# Patient Record
Sex: Female | Born: 1940 | Race: White | Hispanic: No | Smoking: Former smoker
Health system: Southern US, Community
[De-identification: ages and names within clinical notes are randomized; demographics above are authoritative.]

## PROBLEM LIST (undated history)

## (undated) DIAGNOSIS — K2210 Ulcer of esophagus without bleeding: Secondary | ICD-10-CM

## (undated) DIAGNOSIS — M8580 Other specified disorders of bone density and structure, unspecified site: Secondary | ICD-10-CM

## (undated) DIAGNOSIS — E039 Hypothyroidism, unspecified: Secondary | ICD-10-CM

## (undated) DIAGNOSIS — K635 Polyp of colon: Secondary | ICD-10-CM

## (undated) DIAGNOSIS — I10 Essential (primary) hypertension: Secondary | ICD-10-CM

## (undated) DIAGNOSIS — D649 Anemia, unspecified: Secondary | ICD-10-CM

## (undated) DIAGNOSIS — K219 Gastro-esophageal reflux disease without esophagitis: Secondary | ICD-10-CM

## (undated) DIAGNOSIS — K859 Acute pancreatitis without necrosis or infection, unspecified: Secondary | ICD-10-CM

## (undated) DIAGNOSIS — I5032 Chronic diastolic (congestive) heart failure: Secondary | ICD-10-CM

## (undated) DIAGNOSIS — R011 Cardiac murmur, unspecified: Secondary | ICD-10-CM

## (undated) DIAGNOSIS — F32A Depression, unspecified: Secondary | ICD-10-CM

## (undated) DIAGNOSIS — N189 Chronic kidney disease, unspecified: Secondary | ICD-10-CM

## (undated) DIAGNOSIS — F329 Major depressive disorder, single episode, unspecified: Secondary | ICD-10-CM

## (undated) DIAGNOSIS — F1010 Alcohol abuse, uncomplicated: Secondary | ICD-10-CM

## (undated) DIAGNOSIS — C50919 Malignant neoplasm of unspecified site of unspecified female breast: Secondary | ICD-10-CM

## (undated) DIAGNOSIS — I779 Disorder of arteries and arterioles, unspecified: Secondary | ICD-10-CM

## (undated) DIAGNOSIS — M1990 Unspecified osteoarthritis, unspecified site: Secondary | ICD-10-CM

## (undated) HISTORY — PX: EYE SURGERY: SHX253

## (undated) HISTORY — DX: Hypothyroidism, unspecified: E03.9

## (undated) HISTORY — PX: OTHER SURGICAL HISTORY: SHX169

## (undated) HISTORY — DX: Major depressive disorder, single episode, unspecified: F32.9

## (undated) HISTORY — DX: Depression, unspecified: F32.A

## (undated) HISTORY — PX: THYROIDECTOMY: SHX17

## (undated) HISTORY — PX: COSMETIC SURGERY: SHX468

## (undated) HISTORY — DX: Other specified disorders of bone density and structure, unspecified site: M85.80

## (undated) HISTORY — PX: JOINT REPLACEMENT: SHX530

## (undated) HISTORY — PX: HIP ARTHROPLASTY: SHX981

## (undated) HISTORY — DX: Ulcer of esophagus without bleeding: K22.10

## (undated) HISTORY — DX: Gastro-esophageal reflux disease without esophagitis: K21.9

## (undated) HISTORY — DX: Chronic diastolic (congestive) heart failure: I50.32

## (undated) HISTORY — PX: ABDOMINOPLASTY: SUR9

## (undated) HISTORY — PX: APPENDECTOMY: SHX54

## (undated) HISTORY — DX: Essential (primary) hypertension: I10

## (undated) HISTORY — PX: ABDOMINAL HYSTERECTOMY: SHX81

## (undated) HISTORY — DX: Alcohol abuse, uncomplicated: F10.10

## (undated) HISTORY — DX: Disorder of arteries and arterioles, unspecified: I77.9

## (undated) HISTORY — DX: Acute pancreatitis without necrosis or infection, unspecified: K85.90

## (undated) HISTORY — DX: Polyp of colon: K63.5

## (undated) HISTORY — PX: CATARACT EXTRACTION W/ INTRAOCULAR LENS  IMPLANT, BILATERAL: SHX1307

---

## 1974-03-27 DIAGNOSIS — Z87442 Personal history of urinary calculi: Secondary | ICD-10-CM

## 1974-03-27 HISTORY — DX: Personal history of urinary calculi: Z87.442

## 1999-08-16 ENCOUNTER — Encounter: Admission: RE | Admit: 1999-08-16 | Discharge: 1999-08-16 | Payer: Self-pay | Admitting: Family Medicine

## 1999-08-16 ENCOUNTER — Encounter: Payer: Self-pay | Admitting: Family Medicine

## 2001-03-27 ENCOUNTER — Emergency Department (HOSPITAL_COMMUNITY): Admission: EM | Admit: 2001-03-27 | Discharge: 2001-03-27 | Payer: Self-pay | Admitting: Emergency Medicine

## 2001-03-27 ENCOUNTER — Encounter: Payer: Self-pay | Admitting: Emergency Medicine

## 2002-03-04 ENCOUNTER — Inpatient Hospital Stay (HOSPITAL_COMMUNITY): Admission: EM | Admit: 2002-03-04 | Discharge: 2002-03-07 | Payer: Self-pay | Admitting: Psychiatry

## 2003-01-29 ENCOUNTER — Inpatient Hospital Stay (HOSPITAL_COMMUNITY): Admission: EM | Admit: 2003-01-29 | Discharge: 2003-02-02 | Payer: Self-pay | Admitting: Emergency Medicine

## 2003-07-03 ENCOUNTER — Encounter: Admission: RE | Admit: 2003-07-03 | Discharge: 2003-07-03 | Payer: Self-pay | Admitting: Orthopedic Surgery

## 2003-11-04 ENCOUNTER — Inpatient Hospital Stay (HOSPITAL_COMMUNITY): Admission: EM | Admit: 2003-11-04 | Discharge: 2003-11-06 | Payer: Self-pay | Admitting: Emergency Medicine

## 2003-11-10 ENCOUNTER — Encounter: Admission: RE | Admit: 2003-11-10 | Discharge: 2003-11-24 | Payer: Self-pay | Admitting: Family Medicine

## 2004-01-05 ENCOUNTER — Encounter: Admission: RE | Admit: 2004-01-05 | Discharge: 2004-02-03 | Payer: Self-pay | Admitting: Family Medicine

## 2004-05-05 ENCOUNTER — Encounter: Admission: RE | Admit: 2004-05-05 | Discharge: 2004-08-03 | Payer: Self-pay | Admitting: Family Medicine

## 2004-10-12 ENCOUNTER — Ambulatory Visit: Payer: Self-pay | Admitting: Cardiology

## 2004-10-26 ENCOUNTER — Ambulatory Visit: Payer: Self-pay

## 2005-02-13 ENCOUNTER — Ambulatory Visit: Payer: Self-pay | Admitting: Cardiology

## 2007-12-09 ENCOUNTER — Encounter: Admission: RE | Admit: 2007-12-09 | Discharge: 2007-12-09 | Payer: Self-pay | Admitting: Family Medicine

## 2007-12-12 ENCOUNTER — Encounter: Admission: RE | Admit: 2007-12-12 | Discharge: 2007-12-12 | Payer: Self-pay | Admitting: Family Medicine

## 2008-01-08 ENCOUNTER — Encounter: Admission: RE | Admit: 2008-01-08 | Discharge: 2008-01-08 | Payer: Self-pay | Admitting: Internal Medicine

## 2008-01-08 ENCOUNTER — Other Ambulatory Visit: Admission: RE | Admit: 2008-01-08 | Discharge: 2008-01-08 | Payer: Self-pay | Admitting: Interventional Radiology

## 2008-01-08 ENCOUNTER — Encounter (INDEPENDENT_AMBULATORY_CARE_PROVIDER_SITE_OTHER): Payer: Self-pay | Admitting: Interventional Radiology

## 2008-01-28 ENCOUNTER — Ambulatory Visit: Payer: Self-pay | Admitting: Cardiology

## 2008-03-27 DIAGNOSIS — C50919 Malignant neoplasm of unspecified site of unspecified female breast: Secondary | ICD-10-CM

## 2008-03-27 HISTORY — DX: Malignant neoplasm of unspecified site of unspecified female breast: C50.919

## 2008-04-24 ENCOUNTER — Ambulatory Visit (HOSPITAL_COMMUNITY): Admission: RE | Admit: 2008-04-24 | Discharge: 2008-04-25 | Payer: Self-pay | Admitting: Surgery

## 2008-04-24 ENCOUNTER — Encounter (INDEPENDENT_AMBULATORY_CARE_PROVIDER_SITE_OTHER): Payer: Self-pay | Admitting: Surgery

## 2008-07-28 ENCOUNTER — Encounter: Admission: RE | Admit: 2008-07-28 | Discharge: 2008-07-28 | Payer: Self-pay | Admitting: Obstetrics and Gynecology

## 2008-08-03 ENCOUNTER — Encounter: Admission: RE | Admit: 2008-08-03 | Discharge: 2008-08-03 | Payer: Self-pay | Admitting: Obstetrics and Gynecology

## 2008-08-03 ENCOUNTER — Encounter (INDEPENDENT_AMBULATORY_CARE_PROVIDER_SITE_OTHER): Payer: Self-pay | Admitting: Diagnostic Radiology

## 2008-08-03 HISTORY — PX: BREAST BIOPSY: SHX20

## 2008-08-04 ENCOUNTER — Encounter: Admission: RE | Admit: 2008-08-04 | Discharge: 2008-08-04 | Payer: Self-pay | Admitting: Obstetrics and Gynecology

## 2008-08-10 ENCOUNTER — Encounter: Admission: RE | Admit: 2008-08-10 | Discharge: 2008-08-10 | Payer: Self-pay | Admitting: Obstetrics and Gynecology

## 2008-08-26 ENCOUNTER — Ambulatory Visit (HOSPITAL_BASED_OUTPATIENT_CLINIC_OR_DEPARTMENT_OTHER): Admission: RE | Admit: 2008-08-26 | Discharge: 2008-08-26 | Payer: Self-pay | Admitting: Surgery

## 2008-08-26 ENCOUNTER — Encounter (INDEPENDENT_AMBULATORY_CARE_PROVIDER_SITE_OTHER): Payer: Self-pay | Admitting: Surgery

## 2008-08-26 ENCOUNTER — Encounter: Admission: RE | Admit: 2008-08-26 | Discharge: 2008-08-26 | Payer: Self-pay | Admitting: Surgery

## 2008-08-26 HISTORY — PX: BREAST LUMPECTOMY: SHX2

## 2008-08-31 ENCOUNTER — Ambulatory Visit: Payer: Self-pay | Admitting: Oncology

## 2008-09-08 ENCOUNTER — Ambulatory Visit: Admission: RE | Admit: 2008-09-08 | Discharge: 2008-10-06 | Payer: Self-pay | Admitting: Radiation Oncology

## 2008-10-05 ENCOUNTER — Ambulatory Visit: Payer: Self-pay | Admitting: Oncology

## 2008-10-05 LAB — COMPREHENSIVE METABOLIC PANEL
AST: 19 U/L (ref 0–37)
Albumin: 4 g/dL (ref 3.5–5.2)
BUN: 15 mg/dL (ref 6–23)
Calcium: 9 mg/dL (ref 8.4–10.5)
Chloride: 100 mEq/L (ref 96–112)
Potassium: 4.1 mEq/L (ref 3.5–5.3)

## 2008-10-05 LAB — CBC WITH DIFFERENTIAL/PLATELET
Basophils Absolute: 0 10*3/uL (ref 0.0–0.1)
EOS%: 3.1 % (ref 0.0–7.0)
Eosinophils Absolute: 0.2 10*3/uL (ref 0.0–0.5)
HGB: 12.1 g/dL (ref 11.6–15.9)
NEUT#: 3.1 10*3/uL (ref 1.5–6.5)
RDW: 12.7 % (ref 11.2–14.5)
WBC: 5.5 10*3/uL (ref 3.9–10.3)
lymph#: 1.6 10*3/uL (ref 0.9–3.3)

## 2008-12-09 ENCOUNTER — Ambulatory Visit: Admission: RE | Admit: 2008-12-09 | Discharge: 2008-12-09 | Payer: Self-pay | Admitting: Gynecologic Oncology

## 2009-02-04 ENCOUNTER — Ambulatory Visit: Payer: Self-pay | Admitting: Oncology

## 2009-02-08 ENCOUNTER — Encounter: Admission: RE | Admit: 2009-02-08 | Discharge: 2009-02-08 | Payer: Self-pay | Admitting: Oncology

## 2009-02-08 LAB — COMPREHENSIVE METABOLIC PANEL
ALT: 18 U/L (ref 0–35)
AST: 20 U/L (ref 0–37)
Albumin: 4.2 g/dL (ref 3.5–5.2)
CO2: 26 mEq/L (ref 19–32)
Calcium: 9.1 mg/dL (ref 8.4–10.5)
Chloride: 104 mEq/L (ref 96–112)
Creatinine, Ser: 0.83 mg/dL (ref 0.40–1.20)
Potassium: 3.7 mEq/L (ref 3.5–5.3)
Total Protein: 7.3 g/dL (ref 6.0–8.3)

## 2009-02-08 LAB — CBC WITH DIFFERENTIAL/PLATELET
BASO%: 0.8 % (ref 0.0–2.0)
Basophils Absolute: 0 10*3/uL (ref 0.0–0.1)
EOS%: 2.7 % (ref 0.0–7.0)
HCT: 38.7 % (ref 34.8–46.6)
HGB: 13.3 g/dL (ref 11.6–15.9)
MCHC: 34.3 g/dL (ref 31.5–36.0)
MONO#: 0.5 10*3/uL (ref 0.1–0.9)
NEUT%: 58.3 % (ref 38.4–76.8)
RDW: 13.4 % (ref 11.2–14.5)
WBC: 6.1 10*3/uL (ref 3.9–10.3)
lymph#: 1.8 10*3/uL (ref 0.9–3.3)

## 2009-07-12 ENCOUNTER — Encounter: Admission: RE | Admit: 2009-07-12 | Discharge: 2009-07-12 | Payer: Self-pay | Admitting: Family Medicine

## 2009-08-18 ENCOUNTER — Encounter: Admission: RE | Admit: 2009-08-18 | Discharge: 2009-08-18 | Payer: Self-pay | Admitting: Oncology

## 2009-08-18 ENCOUNTER — Ambulatory Visit: Payer: Self-pay | Admitting: Oncology

## 2009-08-19 ENCOUNTER — Encounter: Admission: RE | Admit: 2009-08-19 | Discharge: 2009-08-19 | Payer: Self-pay | Admitting: Oncology

## 2009-08-19 LAB — COMPREHENSIVE METABOLIC PANEL
ALT: 21 U/L (ref 0–35)
AST: 25 U/L (ref 0–37)
Albumin: 4.2 g/dL (ref 3.5–5.2)
BUN: 18 mg/dL (ref 6–23)
Calcium: 9.2 mg/dL (ref 8.4–10.5)
Chloride: 103 mEq/L (ref 96–112)
Potassium: 3.7 mEq/L (ref 3.5–5.3)

## 2009-08-19 LAB — CBC WITH DIFFERENTIAL/PLATELET
Basophils Absolute: 0 10*3/uL (ref 0.0–0.1)
HCT: 37.3 % (ref 34.8–46.6)
HGB: 13.2 g/dL (ref 11.6–15.9)
MCV: 94.9 fL (ref 79.5–101.0)
NEUT%: 56 % (ref 38.4–76.8)
RBC: 3.93 10*6/uL (ref 3.70–5.45)
RDW: 12.1 % (ref 11.2–14.5)
WBC: 5.3 10*3/uL (ref 3.9–10.3)
lymph#: 1.5 10*3/uL (ref 0.9–3.3)

## 2010-01-26 ENCOUNTER — Ambulatory Visit: Payer: Self-pay

## 2010-01-26 ENCOUNTER — Encounter: Payer: Self-pay | Admitting: Cardiology

## 2010-01-26 DIAGNOSIS — I739 Peripheral vascular disease, unspecified: Secondary | ICD-10-CM

## 2010-01-26 DIAGNOSIS — I779 Disorder of arteries and arterioles, unspecified: Secondary | ICD-10-CM | POA: Insufficient documentation

## 2010-02-28 ENCOUNTER — Encounter: Payer: Self-pay | Admitting: Cardiology

## 2010-02-28 ENCOUNTER — Ambulatory Visit: Payer: Self-pay | Admitting: Cardiology

## 2010-02-28 DIAGNOSIS — I10 Essential (primary) hypertension: Secondary | ICD-10-CM | POA: Insufficient documentation

## 2010-04-16 ENCOUNTER — Encounter: Payer: Self-pay | Admitting: Oncology

## 2010-04-17 ENCOUNTER — Encounter: Payer: Self-pay | Admitting: Oncology

## 2010-04-28 NOTE — Miscellaneous (Signed)
Summary: Orders Update  Clinical Lists Changes  Problems: Added new problem of CAROTID ARTERY DISEASE (ICD-433.10) Orders: Added new Test order of Carotid Duplex (Carotid Duplex) - Signed 

## 2010-04-28 NOTE — Assessment & Plan Note (Signed)
Summary: f2y/pt needs carotid/lwb   Visit Type:  2 years follow up Primary Provider:  ross  CC:  No cardiac complaints.  History of Present Illness: Doing well overall.  No specific complaints per se.  Denies any specific chest pain.  No stroke symptoms.  Ocassional palpitiations.  Feels good overall.  Has one glass of wine per night.   Problems Prior to Update: 1)  Hypertension, Benign  (ICD-401.1) 2)  Carotid Artery Disease  (ICD-433.10)  Current Medications (verified): 1)  Synthroid 150 Mcg Tabs (Levothyroxine Sodium) .... Take 1 Tablet By Mouth Once A Day 2)  Omeprazole 20 Mg Tbec (Omeprazole) .... Take 1 Tablet By Mouth Once A Day 3)  Amlodipine Besylate 10 Mg Tabs (Amlodipine Besylate) .... Take One Tablet By Mouth Daily 4)  Vitamin D2 50,000 Iu .... Once A Week 5)  Vitamin D3 5000 Unit Caps (Cholecalciferol) .... Take 1 Capsule By Mouth Once A Day 6)  Multivitamins  Tabs (Multiple Vitamin) .... Take 1 Tablet By Mouth Once A Day 7)  Aspirin 81 Mg Tbec (Aspirin) .... Take One Tablet By Mouth Daily 8)  Magnesium Oxide 400 Mg Tabs (Magnesium Oxide) .... Take 1 Tablet By Mouth Once A Day 9)  Calcium Carbonate 600 Mg Tabs (Calcium Carbonate) .... Take 1 Tablet By Mouth Two Times A Day 10)  Omega 3,6,9 W/fish 800mg  .... 2 A Day 11)  Super B Complex/vitamin C  Tabs (B Complex-C) .Marland Kitchen.. 1 A Day 12)  Glucosamine 1500 Complex  Caps (Glucosamine-Chondroit-Vit C-Mn) .... 3 Cap Three Times A Day 13)  Stool Softener 100 Mg Caps (Docusate Sodium) .... Take 1 Capsule By Mouth At Bedtime 14)  Benadryl 25 Mg Tabs (Diphenhydramine Hcl) .... Take 1 Tab By Mouth At Bedtime '  Allergies: 1)  ! Penicillin  Vital Signs:  Patient profile:   70 year old female Height:      61 inches Weight:      142.50 pounds BMI:     27.02 Pulse rate:   77 / minute Pulse rhythm:   regular Resp:     18 per minute BP sitting:   140 / 78  (left arm) Cuff size:   large  Vitals Entered By: Sidney Ace (February 28, 2010 1:59 PM)  Physical Exam  General:  Well developed, well nourished, in no acute distress. Head:  normocephalic and atraumatic Eyes:  PERRLA/EOM intact; conjunctiva and lids normal. Neck:  Bilateral carotid bruits.   Lungs:  Clear bilaterally to auscultation and percussion. Heart:  PMI non displaced. Normal S1 and S2.  No murmur, rub or gallop.   Extremities:  No clubbing or cyanosis.  trace edema Neurologic:  Alert and oriented x 3.   EKG  Procedure date:  02/28/2010  Findings:      NSR.  PACs.  No acute changes.    Carotid Doppler  Procedure date:  01/26/2010  Findings:      see report.   Unable to transport document.   Impression & Recommendations:  Problem # 1:  CAROTID ARTERY DISEASE (ICD-433.10) stable.  Follow up at one year.  statins discussed.  She does not want to take.  Her updated medication list for this problem includes:    Aspirin 81 Mg Tbec (Aspirin) .Marland Kitchen... Take one tablet by mouth daily  Problem # 2:  HYPERTENSION, BENIGN (ICD-401.1) controlled.  Life style issues discussed.  Her updated medication list for this problem includes:    Amlodipine Besylate 10 Mg Tabs (Amlodipine besylate) .Marland KitchenMarland KitchenMarland KitchenMarland Kitchen  Take one tablet by mouth daily    Aspirin 81 Mg Tbec (Aspirin) .Marland Kitchen... Take one tablet by mouth daily  Patient Instructions: 1)  Your physician recommends that you schedule a follow-up appointment in: 1 year 2)  Your physician recommends that you continue on your current medications as directed. Please refer to the Current Medication list given to you today.

## 2010-06-07 ENCOUNTER — Other Ambulatory Visit: Payer: Self-pay | Admitting: Obstetrics and Gynecology

## 2010-06-07 DIAGNOSIS — Z780 Asymptomatic menopausal state: Secondary | ICD-10-CM

## 2010-06-07 DIAGNOSIS — Z9889 Other specified postprocedural states: Secondary | ICD-10-CM

## 2010-07-04 LAB — COMPREHENSIVE METABOLIC PANEL
AST: 20 U/L (ref 0–37)
Albumin: 3.8 g/dL (ref 3.5–5.2)
Alkaline Phosphatase: 62 U/L (ref 39–117)
BUN: 17 mg/dL (ref 6–23)
Chloride: 108 mEq/L (ref 96–112)
GFR calc Af Amer: >60 mL/min (ref >60)
Potassium: 4.3 mEq/L (ref 3.5–5.1)
Total Bilirubin: 0.5 mg/dL (ref 0.3–1.2)

## 2010-07-04 LAB — CBC
HCT: 35.1 % — ABNORMAL LOW (ref 36.0–46.0)
Platelets: 138 10*3/uL — ABNORMAL LOW (ref 150–400)
RBC: 3.68 MIL/uL — ABNORMAL LOW (ref 3.87–5.11)
WBC: 5.5 10*3/uL (ref 4.0–10.5)

## 2010-07-04 LAB — DIFFERENTIAL
Basophils Absolute: 0 10*3/uL (ref 0.0–0.1)
Basophils Relative: 0 % (ref 0–1)
Eosinophils Relative: 4 % (ref 0–5)
Monocytes Absolute: 0.5 10*3/uL (ref 0.1–1.0)

## 2010-07-04 LAB — CANCER ANTIGEN 27.29: CA 27.29: 15 U/mL (ref 0–39)

## 2010-07-11 LAB — COMPREHENSIVE METABOLIC PANEL
AST: 14 U/L (ref 0–37)
CO2: 27 mEq/L (ref 19–32)
Chloride: 103 mEq/L (ref 96–112)
Creatinine, Ser: 0.91 mg/dL (ref 0.4–1.2)
GFR calc Af Amer: >60 mL/min (ref >60)
GFR calc non Af Amer: >60 mL/min (ref >60)
Total Bilirubin: 0.7 mg/dL (ref 0.3–1.2)

## 2010-07-11 LAB — DIFFERENTIAL
Basophils Absolute: 0 10*3/uL (ref 0.0–0.1)
Basophils Relative: 0 % (ref 0–1)
Eosinophils Absolute: 0.2 10*3/uL (ref 0.0–0.7)
Eosinophils Relative: 4 % (ref 0–5)
Lymphocytes Relative: 33 % (ref 12–46)

## 2010-07-11 LAB — PROTIME-INR: Prothrombin Time: 13 seconds (ref 11.6–15.2)

## 2010-07-11 LAB — CBC
HCT: 36.8 % (ref 36.0–46.0)
MCV: 94.9 fL (ref 78.0–100.0)
RBC: 3.88 MIL/uL (ref 3.87–5.11)
WBC: 4.3 10*3/uL (ref 4.0–10.5)

## 2010-07-11 LAB — URINALYSIS, ROUTINE W REFLEX MICROSCOPIC
Bilirubin Urine: NEGATIVE
Ketones, ur: NEGATIVE mg/dL
Nitrite: NEGATIVE
Urobilinogen, UA: 0.2 mg/dL (ref 0.0–1.0)

## 2010-07-11 LAB — CALCIUM: Calcium: 8.5 mg/dL (ref 8.4–10.5)

## 2010-07-11 LAB — GLUCOSE, CAPILLARY

## 2010-08-09 NOTE — Op Note (Signed)
Anita Pollard, Anita Pollard NO.:  [id number]   MEDICAL RECORD NO.:  [id number]          PATIENT TYPE:  AMB   LOCATION:  DAY                          FACILITY:  [facility]   PHYSICIAN:  Earnstine Regal, MD      DATE OF BIRTH:  1940/12/26   DATE OF PROCEDURE:  04/24/2008  DATE OF DISCHARGE:                               OPERATIVE REPORT   PREOPERATIVE DIAGNOSIS:  Bilateral thyroid nodules.   POSTOPERATIVE DIAGNOSIS:  Bilateral thyroid nodules.   PROCEDURE:  Total thyroidectomy   SURGEON  Earnstine Regal, MD, FACS   ASSISTANT:  Sammuel Hines. Daiva Nakayama, M.D.   ANESTHESIA:  General.   ESTIMATED BLOOD LOSS:  Minimal.   PREPARATION:  Betadine.   COMPLICATIONS:  None.   INDICATIONS:  The patient is a 70 year old white female from [place],  [place].  During a routine carotid duplex she was noted to have a  thyroid nodule.  Thyroid ultrasound showed an 8-mm nodule in the right  thyroid lobe containing calcifications and a complex 3.8-cm nodule with  calcification in the left thyroid lobe.  Fine-needle aspiration biopsy  showed a limited sample but no overt sign of malignancy.  The patient is  referred by Dr. Delrae Rend from endocrinology for thyroidectomy for  definitive diagnosis.   BODY OF REPORT:  Procedure was done in OR #12 at the [facility].  The patient was brought to the operating room and  placed in a supine position on the operating room table.  Following  administration of general anesthesia, the patient is positioned and then  prepped and draped in the usual strict aseptic fashion.  After  ascertaining that an adequate level of anesthesia had been achieved, a  Kocher incision is made with a #15 blade.  Dissection was carried  through subcutaneous tissues and platysma.  Hemostasis was obtained with  the electrocautery.  Skin flaps were elevated cephalad and caudad from  the thyroid notch to the sternal notch.  A Mahorner  self-retaining  retractor was placed for exposure.  Strap muscles are incised in the  midline and dissection is begun on the left side of the neck.  Left  thyroid lobe was exposed.  It contains a dominant nodule which occupies  a majority of the lobe.  Strap muscles were reflected laterally.  Venous  tributaries were divided between small and medium Ligaclips with the  Harmonic scalpel.  Superior pole vessels are dissected out and ligated  in continuity with 2-0 silk ties and divided with the Harmonic scalpel.  The gland is rolled anteriorly.  Parathyroid tissue is identified and  preserved.  Branches of the inferior thyroid artery are divided between  small Ligaclips with the Harmonic scalpel.  Recurrent nerve was  identified and preserved.  Ligament of Gwenlyn Found was transected with the  electrocautery and the gland is mobilized up and onto the anterior  trachea.  Isthmus is mobilized across the midline.  No significant  pyramidal lobe exists.   Right thyroid lobe is then exposed again, reflecting the strap muscles  laterally.  Right lobe  was much smaller in size, although it contained  some small nodules.  It is gently dissected out.  Superior pole vessels  are again dissected out, ligated in continuity with 2-0 silk ties and  divided with the Harmonic scalpel.  Inferior venous tributaries were  divided between medium Ligaclips with the Harmonic scalpel.  Branches of  the inferior thyroid artery are divided between small Ligaclips with the  Harmonic scalpel.  Recurrent nerve was again identified.  Inferior  parathyroid gland is identified.  Ligament of Gwenlyn Found is transected and  the gland is mobilized onto the anterior trachea.  Remaining inferior  venous tributaries were divided between medium Ligaclips with the  Harmonic scalpel.  The entire thyroid is then submitted to pathology  with a suture marking the left superior pole.   Neck is inspected for hemostasis.  Surgicel was placed in the  operative  field bilaterally.  Strap muscles were reapproximated in the midline  with interrupted 3-0 Vicryl sutures.  Platysma was closed with  interrupted 3-0 Vicryl sutures.  Skin was closed with a running 4-0  Monocryl subcuticular suture.  Wound is washed and dried and benzoin and  Steri-Strips are applied.  Sterile dressings are applied.  The patient  is awakened from anesthesia and brought to the recovery room in stable  condition.  The patient tolerated the procedure well.      Earnstine Regal, MD  Electronically Signed     TMG/MEDQ  D:  04/24/2008  T:  04/24/2008  Job:  [id number]   cc:   Delrae Rend, M.D.   Myriam Jacobson, M.D.  Fax: [phone]

## 2010-08-09 NOTE — Op Note (Signed)
NAMEROTUNDA, GENSER               ACCOUNT NO.:  [id number]   MEDICAL RECORD NO.:  [id number]          PATIENT TYPE:  AMB   LOCATION:  [place]                          FACILITY:  [facility]   PHYSICIAN:  Thomas A. 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   PROCEDURES:  1. Right breast needle-localized lumpectomy.  2. Right axillary sentinel lymph node mapping with injection of      methylene blue dye.   SURGEON:  Marcello Moores A. Cornett, MD   ANESTHESIA:  LMA with 0.25% Sensorcaine with epinephrine.   ESTIMATED BLOOD LOSS:  20 mL.   SPECIMENS:  1. Right breast mass with localizing clip and wire verified by      radiograph to be adequate.  2. Four right axillary sentinel lymph nodes negative by touch prep for      metastatic disease.   INDICATIONS FOR PROCEDURE:  The patient is a 70 year old postmenopausal  female, found to have a cluster of abnormal microcalcifications in a  right breast mass.  Core biopsy showed DCIS, but there is some question  if there is microinvasion on this.  She wished to preserve her breast.  We talked about breast conserving means to include lumpectomy with the  potential for postoperative radiation therapy.  I also felt that  evaluation of her lymph nodes was in order even though she had DCIS,  there were some question of microinvasion.  She wished to proceed with  breast conserving means after lengthy discussion.   DESCRIPTION OF PROCEDURE:  After undergoing wire localization to the  right breast and injection of technetium sulfur colloid to the right  breast, she was brought back to the operating room.  She was placed  supine and LMA anesthesia was initiated.  The right breast was prepped  with alcohol and 4 mL of a combination of methylene blue with normal  saline were injected in a subareolar position and massaged.  The right  breast and right axilla were then prepped and draped in a sterile  fashion.  The sentinel node was done first.  NeoProbe was used to  identify an area of increased activity in the right axilla.  Incision  was made of over area and dissection was carried down into her axilla.  There were 4 sentinel nodes, 2 were blue and hot, the other 2 were just  hot with counts well below the 10% threshold of the highest lymph node.  All 4 were sent for evaluation, which included touch prep, and these  were negative for metastatic disease.  The lumpectomy was done next.  An  oblique incision was made in the upper inner quadrant of her right  breast.  The wire was brought out through the incision and all tissue  around the wire was excised.  The mass was palpable.  Radiograph was  taken and I could see the mass and clip were in the specimen.  We  irrigated out the wound.  I then mobilized some subcutaneous breast  tissue to close the defect using 3-0 Vicryl.  The skin was approximated  with a deep 3-0 Vicryl stitch and 4-0 Monocryl was used to close the  skin in subcuticular fashion.  We examined the axilla and it was found  to be hemostatic.  Small piece of Surgicel was placed in the deep  axilla.  We closed the subcu fat with 3-0 Vicryl and used 4-0 Monocryl  to close the skin in a subcuticular fashion.  Dermabond was applied to  both incisions.  All final counts of sponge, needle, and instruments  were found to be correct at this portion of the case.  The patient was  then awoken and taken to the recovery room in satisfactory condition.       Thomas A. Cornett, M.D.  Electronically Signed     TAC/MEDQ  D:  08/26/2008  T:  08/27/2008  Job:  [id number]   cc:   C. Melinda Crutch, M.D.  Katina Degree, M.D.

## 2010-08-12 NOTE — H&P (Signed)
Anita Pollard, Anita NO.:  [id number]   MEDICAL RECORD NO.:  [id number]                   PATIENT TYPE:  EMS   LOCATION:  ED                                   FACILITY:  [facility]   PHYSICIAN:  Corinna L. Conley Canal, MD             DATE OF BIRTH:  04-Dec-1940   DATE OF ADMISSION:  11/04/2003  DATE OF DISCHARGE:                                HISTORY & PHYSICAL   CHIEF COMPLAINT:  Vomiting.   HISTORY OF PRESENT ILLNESS:  [name] is a 70 year old alcoholic white  female who presents to the emergency room with intractable vomiting.  She  reports that initially the emesis was  nonbloody but then became dark.  She  has a history of upper GI bleed secondary to ulcerative esophagitis.  She  had endoscopy in November of 2004 by Dr. Oletta Lamas which showed this.  She had  no varices at that time.  She also tripped and fell on the stairs this  morning.  She reports that her last drink was on Sunday but apparently  according to EMS there were reports of binging on alcohol.  The patient was  found to have high sugar here in the emergency room and denies history of  diabetes but was told by Dr. Harrington Challenger to watch the sugars and carbohydrates.  She has had no blurry vision, no polyuria, polydipsia, no melena.   PAST MEDICAL HISTORY:  1. Chronic calcific pancreatitis.  2. Alcohol dependence.  3. Ulcerative esophagitis.  4. Anxiety disorder.  5. Hypothyroidism.  6. Osteoarthritis.  7. 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  8. History of admission to [facility] for anxiety, depression, and     alcohol abuse.  9. History of hysterectomy.  10.      Appendectomy.   MEDICATIONS:  1. Wellbutrin XL 300 mg p.o. daily.  2. Levoxyl 150 mcg p.o. daily.  3. Mobic.  4. Premarin 0.625 mg p.o. daily.   SOCIAL HISTORY:  The patient will not quantify the amount of alcohol she  drinks, but per records, it is quite a lot.  She reports her last drink  Sunday.  She  does not smoke.  She is not working.  She is widowed.   FAMILY HISTORY:  Her father had diabetes and her brother.   REVIEW OF SYSTEMS:  As above, otherwise negative.   PHYSICAL EXAMINATION:  VITAL SIGNS:  Her temperature is 98.6, heart rate  111, respiratory rate 20,  blood pressure 140/68.  GENERAL:  In general the patient is an unkempt white female in no acute  distress.  HEENT:  Normocephalic.  She has laceration to the back of her head.  Pupils  equal, round, and reactive to light.  Sclerae nonicteric.  She has slightly  dry mucous membranes.  NECK:  Supple.  No carotid bruits.  No thyromegaly.  LUNGS:  Clear to auscultation bilaterally without wheezes, rhonchi, or  rales.  CARDIOVASCULAR:  Regular rate and rhythm without murmurs, gallops, or rubs.  ABDOMEN:  Normal bowel sounds.  Soft.  She has some mild epigastric  tenderness.  GENITOURINARY/RECTAL:  Deferred.  EXTREMITIES:  No clubbing, cyanosis, or edema.  No deformities.  SKIN:  She has multiple ecchymoses and heeling lacerations.  NEUROLOGIC:  The patient is alert and oriented x3.  Cranial nerves and  sensory motor exam are intact.  PSYCHIATRIC:  The patient is calm and cooperative.   LABORATORY DATA:  CBC is normal.  INR is 0.9.  Sodium is 134, potassium 5.2,  chloride 87, bicarbonate 14, glucose 460, BUN 47, creatinine 2.9, calcium 7.  Anion gap is 33.  Her creatinine in November was normal.  Her blood alcohol  is 6.   She had several spinal films and CT of the head which showed nothing acute  per ER physician.   ASSESSMENT/PLAN:  1. Probable diabetic ketoacidosis.  I will get a blood acetone level, but     she does have a high anion gap.  She has no previous diabetic history.  I     will give IV fluids, IV insulin, diabetic teaching, and get a hemoglobin     A1c.  She may be able to be maintained on oral medications.  I will start     Amaryl for now.  2. Nausea/vomiting with reported coffee ground emesis.  She has  had no     further events here in the emergency room.  EGD in 2004 showed ulcerative     esophagitis.  Unless she continues to have bleeding she will most likely     not need EGD.  She will get serial hemoglobins, IV Protonix.  I suspect     the nausea and vomiting was due to the DKA but she has a history of     pancreatitis and I will check an amylase and lipase.  Also this may be     due to her esophagitis/gastritis from continued alcohol abuse.  For now     clear liquids.  3. Alcohol dependence.  She will get the Ativan protocol and thiamine beyond     ET precautions.  4. Anxiety.  I will resume her outpatient medications.  5. Hypothyroidism.  Check TSH.  6. Osteoarthritis.  Hold NSAIDS.  7. Status post fall.  No significant trauma was sustained.  In addition to     above I will check an EKG and I have encouraged her to quit drinking and     attend AA once she is discharged.                                               Corinna L. Conley Canal, MD    CLS/MEDQ  D:  11/04/2003  T:  11/04/2003  Job:  [id number]   cc:   C. Melinda Crutch, M.D.  [address]  [place]  [place] [address]  Fax: Emery Rolla Flatten., M.D.  [address] [place]  [place]  [place] [address]  Fax: [phone]

## 2010-08-12 NOTE — Discharge Summary (Signed)
Anita Pollard, Anita Pollard                           ACCOUNT NO.:  [id number]   MEDICAL RECORD NO.:  [id number]                   PATIENT TYPE:  INP   LOCATION:  [place]                                 FACILITY:  [facility]   PHYSICIAN:  John C. Amedeo Plenty, M.D.                 DATE OF BIRTH:  [birth year]   DATE OF ADMISSION:  01/29/2003  DATE OF DISCHARGE:  02/02/2003                                 DISCHARGE SUMMARY   HISTORY OF PRESENT ILLNESS:  The patient is a 70 year old white female who  presented with hematemesis with mild hypotension on presentation to the  emergency room.  She has a history of alcohol abuse and was briefly  hospitalized at [facility] for problems with anxiety and  depression as well as alcohol abuse but was lost to follow-up after this.  She was admitted for GI bleeding and a hemoglobin of 13, mildly elevated  liver function tests with SGOT of 156, SGPT of 113, and slightly elevated  amylase as well.  For details, please see admission history and physical.   HOSPITAL COURSE:  The patient underwent upper endoscopy, which showed  significant ulcerative esophagitis.  The patient was very combative during  the procedure.  Apparently this limited visualization to some degree, but  there was a hiatal hernia as well as a marked esophagitis and no obvious  esophageal and gastric varices.  The patient was placed on DT prophylaxis  and treated with IV Protonix.  She was started on a clear liquid diet and  this was gradually advanced to a regular diet.  Social services was  consulted to offer alcohol detox and rehab, but the patient declined any of  this.  Her nausea abated and her hemoglobin remained stable.  She had no  black stools.  Her minimally-elevated amylase quickly normalized.  She did  develop hypokalemia with correction of initial acidosis with normal saline.  Her potassium on November 7 was 2.2.  She was switched to D5 half normal  saline with 30 mEq of  potassium chloride per liter and also given two runs  of IV potassium and on November 8, her potassium had risen to 2.8.  She was  clinically stable and felt ready for discharge at that point.   DISCHARGE DIAGNOSES:  1. Reflux esophagitis with gastrointestinal bleeding.  2. Possible mild pancreatitis.  3. History of alcohol abuse.   DISCHARGE INSTRUCTIONS:  1. Discontinue the anti-inflammatory medicine Mobic, which she was on at the     time of admission.  2. Increase Prilosec to b.i.d. dosing.  3.     She was advised to stop alcohol.  4. Follow up with Dr. Amedeo Plenty or Dr. Oletta Lamas in the office in three to four     weeks.   CONDITION ON DISCHARGE:  Improved.  John C. Amedeo Plenty, M.D.    JCH/MEDQ  D:  02/02/2003  T:  02/02/2003  Job:  [id number]   cc:   Jeneen Rinks L. Rolla Flatten., M.D.  [address], [place]  [place]  [place] [address]  Fax: [phone]   C. Melinda Crutch, M.D.  [address]  [place]  [place] [address]  Fax: [phone]

## 2010-08-12 NOTE — Letter (Signed)
January 31, 2008    C. Melinda Crutch, M.D.  [place], [place] [address].   RE:  Anita Pollard  MRN:  [id number]  /  DOB:  [birth year]   Dear Dr. Harrington Challenger,   Your nice patient, Anita Pollard was seen today in the office in  followup.  As you know, she has had a recent tummy tuck.  She has also  had some concerns potentially about her thyroid with incidental note.  I  had spoken with Anita Pollard, and there was a discussion regarding  preoperative evaluation.  Apparently, she went ahead and had this done.  She is also concerned about the possibility of whether or not she should  have thyroid surgery, and has been interested in getting additional  opinions.  She had carotid studies done.  The right ICA stenosis was  less than 50%.  The incidental notion note was that of the left lobe of  the thyroid which with ill-defined thyroid nodule with cystic areas and  focal calcification.  Apparently, she has had a biopsy, and these have  not been abnormal.  Her vitamin D level has been low.  Otherwise, she  has been in stable condition otherwise.   CURRENT MEDICATIONS:  1. Amlodipine 10 mg daily.  2. Aspirin 81 mg daily.  3. Omega-3 2 tablets daily.  4. B12.  5. Vitamin D.   PHYSICAL EXAMINATION:  VITAL SIGNS:  Today, the blood pressure is 147/80  and the pulse is 67.  LUNG:  Fields are clear.  CARDIAC:  Rhythm is regular.  EXTREMITIES:  There is only mild lower extremity edema, one of her  concerns in that she has had changing of her medications as she has had  some problems with lisinopril, and Atacand, and subsequently been on  amlodipine.  She has been concerned about the edema, and I have spoken  with her about a variety of different options including the possible use  of diuretics.   Her electrocardiogram demonstrates normal sinus rhythm essentially  within normal limits.   She has quite a few concerns about her thyroid situation.  I told her it  would be  reasonable and not inappropriate to consider, another opinion  is that I would make her more comfortable.  With regard to her  medications, I basically reaffirm what she would told her.  I had  mentioned to her that the edema could be a side effect from the  amlodipine clearly.  I will be more than happy to see her at any time.    Sincerely,      Loretha Brasil. Lia Foyer, MD, [facility]  Electronically Signed    TDS/MedQ  DD: 01/31/2008  DT: 01/31/2008  Job #: [id number]

## 2010-08-12 NOTE — Op Note (Signed)
NAMELAMYIAH, OMOTO NO.:  [id number]   MEDICAL RECORD NO.:  [id number]                   PATIENT TYPE:  EMS   LOCATION:  ED                                   FACILITY:  [facility]   PHYSICIAN:  James L. Rolla Flatten., M.D.          DATE OF BIRTH:  1941/01/19   DATE OF PROCEDURE:  01/29/2003  DATE OF DISCHARGE:                                 OPERATIVE REPORT   PROCEDURE:  Esophagogastroduodenoscopy.   MEDICATIONS:  Cetacaine spray 100 mcg, Versed 11.5 mg IV.   INDICATIONS:  Hematemesis in a woman with alcohol abuse and elevated liver  tests.  The possibility of variceal bleed is entertained.   DESCRIPTION OF PROCEDURE:  The procedure had been explained to the patient  and her daughter and consent obtained.  The possibility of a bleeding ulcers  as well as esophageal variceal banding had all been discussed with them in  the emergency room including the reasons for doing these things.  The  patient was quite agitated, continued to bite down on the bite block and was  very uncooperative with swallowing the scope.  She continued to bite, kick  and swing despite lots of medications.  Eventually we were able to get her  calmed down enough to swallow the scope but it is a very difficult passage  of the scope.  The esophagus was entered.  There was blood in the esophagus  and the esophagus was seen to be ulcerated.  We went down into the stomach  and the pylorus was identified and passed into the duodenum including the  bulb and second portion.  It was completely unremarkable.  No ulceration or  inflammation.  The pyloric channel, antrum and body of the stomach were  totally unremarkable.  There was no ulceration.  There was blood coming from  the esophagus on retroflexed view.  There was a hiatal hernia at the distal  proximal esophagus endoscopically, inflamed with marked ulceration,  friability and bleeding.  I did not see varices in either the forward or  retroflexed views.  There was no stigmata of variceal bleeding.  The  esophagus was ulcerated, typically the bottom part of the esophagus.  I  contemplated trying to obtain a biopsy for bowel culture; however, this  patient was so agitated during the procedure that it required three people  to hold her down to do it, that we elected just to photograph the esophagus  and pulled the scope out.  Again there were no signs of varices.  The scope  was withdrawn.  The patient was resting comfortably at the termination of  the procedure.   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   PLAN:  Will admit to the ICU due to her recent bleeding and place her on IV  Protonix.  She will need a DVT prophylaxis.  James L. Rolla Flatten., M.D.   Jaynie Bream  D:  01/29/2003  T:  01/29/2003  Job:  [id number]   cc:   C. Melinda Crutch, M.D.  [address]  [place]  [place] [address]  Fax: [phone]

## 2010-08-12 NOTE — Discharge Summary (Signed)
Anita Pollard, MAMON NO.:  [id number]   MEDICAL RECORD NO.:  [id number]                   PATIENT TYPE:  IPS   LOCATION:  0507                                 FACILITY:  [facility]   PHYSICIAN:  Rulon Eisenmenger, M.D.              DATE OF BIRTH:  01-03-1941   DATE OF ADMISSION:  03/04/2002  DATE OF DISCHARGE:  03/07/2002                                 DISCHARGE SUMMARY   IDENTIFYING DATA:  This is a 70 year old Caucasian female who was  voluntarily admitted for a gradual increase in depression and concern about  drinking.  Family was concerned about the patient's safety.  The patient's  husband of 32 years died of leukemia in 12-24-22 and the patient admitted  to a history of drinking distantly and had stopped drinking for six months  while her husband was dying and the began drinking after his death.  The  patient reported gradual increase of depressive symptoms to the point that  she had no appetite, had difficulty sleeping, and felt that she had no  reason to live, although denied acute suicidal ideation with a plan.   MEDICATIONS:  1. Synthroid.  2. Zoloft 100 mg q.a.m.  3. Mobic for arthritis.  4. Premarin.   DRUG ALLERGIES:  PENICILLIN.   PHYSICAL EXAMINATION:  VITAL SIGNS:  Stable, afebrile.  GENERAL:  Essentially within normal limits.  NEUROLOGIC:  Nonfocal   LABORATORY DATA:  CBC and CMET: Within normal limits except SGOT and SGPT  elevated at 167 and 203, consistent with alcoholic hepatitis.  TSH was also  slightly elevated at 7.724.  The patient was recommended to have followup in  two weeks with thyroid panel.   MENTAL STATUS EXAM:  Frail, small built female, fully alert, in no acute  distress, tearful, cooperative, polite, crying immediately when speaking  about the loss of her husband, clearly depressed.  Speech: Within normal  limits.  Thought process: Positive for loss of concentration and some  latency; no evidence of psychosis  or paranoia, positive passive suicidal  ideation.  Cognitive: Intact.  Judgment and insight: Poor.   ADMISSION DIAGNOSES:   AXIS I:  1. Alcohol dependence.  2. Major depression, severe, recurrent.   AXIS II:  None.   AXIS III:  1. Hypothyroidism.  2. Elevated liver enzymes, likely alcoholic hepatitis.  3. Thrombocytopenia also related to alcohol use, likely.   AXIS IV:  Moderate stressors.   AXIS V:  25/60   HOSPITAL COURSE:  The patient was admitted, ordered routine p.r.n.  medications, underwent further monitoring, and was encouraged to participate  in individual, group, and milieu therapy.  She was placed on a low dose  Librium protocol, given substance abuse therapy as well as addressing grief  and depression issues.  The patient was restarted on medical medications and  optimized on Zoloft, titrating it up to 150 mg and given trazodone to  restore sleep.  Family meeting was held and aftercare planning.  Regarding  the patient's alcohol use and impact on mood was discussed and the patient  reported motivated to become sober and followup with aftercare  recommendations.   CONDITION ON DISCHARGE:  Her condition on discharge was markedly improved.  Mood was less depressed.  Affect was brighter.  Thought processes: Goal  directed.  Thought content: Negative for dangerous ideation or psychotic  symptoms.  The patient had no acute withdrawal symptoms and was motivated to  followup with treatment recommendations.   DISCHARGE MEDICATIONS:  1. Phenobarbital 15 mg b.i.d. for one day and then one q.h.s. to complete     detoxification taper.  2. Premarin 0.625 mg q.a.m.  3. Levothyroxine 88 mcg q.a.m.  4. Os-Cal 500 mg b.i.d.  5. Zoloft 100 mg one and a half q.a.m.  6. Trazodone 150 mg q.h.s.   FOLLOW UP:  The patient was to followup with her primary care physician  regarding elevated LFTs and thyroid condition and to followup with Shari Prows on December 30 for  individual therapy regarding substance abuse  treatment as well as with [facility] on December 22 to  followup for medication management.   DISCHARGE DIAGNOSES:   AXIS I:  1. Alcohol dependence.  2. Major depression, severe, recurrent.   AXIS II:  None.   AXIS III:  1. Hypothyroidism.  2. Elevated liver enzymes, likely alcoholic hepatitis.  3. Thrombocytopenia also related to alcohol use, likely.   AXIS IV:  Moderate stressors.   AXIS V:  Global assessment of functioning on discharge was 70.                                               Rulon Eisenmenger, M.D.   JEM/MEDQ  D:  03/18/2002  T:  03/18/2002  Job:  [id number]

## 2010-08-12 NOTE — Discharge Summary (Signed)
Anita Pollard, VEREB NO.:  [id number]   MEDICAL RECORD NO.:  [id number]                   PATIENT TYPE:  INP   LOCATION:  [id number]                                 FACILITY:  [facility]   PHYSICIAN:  Benito Mccreedy, M.D.             DATE OF BIRTH:  11-10-1940   DATE OF ADMISSION:  11/04/2003  DATE OF DISCHARGE:  11/06/2003                                 DISCHARGE SUMMARY   ADMISSION DIAGNOSES:  Diabetic ketoacidosis, nausea and vomiting with  reported coffee-ground emesis.   DISCHARGE DIAGNOSES:  1. Uncontrolled diabetes mellitus/newly diagnosed diabetes.  2. Reported coffee-ground vomitus (single episode) probably related to     esophagitis.     a. EGD in 2004 indicated esophagitis.  3. Normocytic anemia.  4. Mild hypokalemia of 3.4.  Potassium was repleted prior to discharge.     a. The patient hemodynamics remained stable during hospitalization.  She        did not have any documented episode of vomitus with coffee-grounds.        She did not have any complaints of any recurrence of this symptom.        Her hemoglobin remained stable with stable hemodynamics.  5. Thrombocytopenia, probably alcohol related.  6. Escherichia coli urinary tract infection.   DISCHARGE LABORATORY DATA:  WBC 4.4, hemoglobin 9.8, hematocrit 28.6, MCV  __________ 7.1, platelet count 100, INR 0.9, pro time 12.7.  Sodium 139,  potassium 3.2, chloride 110, CO2 24, glucose 74, BUN 14, creatinine 0.8,  calcium 7.8.   OTHER DIAGNOSTIC WORK-UP OF SIGNIFICANCE:  Hemoglobin A1c 5.7.  Blood  acetone moderate.  TSH 2.291.  Total iron 181with normal B12 and folate,  ferritin 1096.  Fecal occult blood testing positive.   CONSULTANTS:  Not applicable.   PROCEDURE:  Not applicable.   DISCHARGE MEDICATIONS:  1. K-Dur 10 mEq p.o. daily.  2. Ciprofloxacin 250 mg p.o. b.i.d.  3. Amaryl 2 mg p.o. daily.  4. Protonix 40 mg p.o. b.i.d.  5. The patient has been asked to resume her home  medications.   [name] presented with intractable nausea and vomiting.  She had not  anything to drink about 4 days prior to evaluation at the ED.  At the ED,  she was found to have a very high sugar and low bicarbonate, and therefore  hospitalist service was asked to evaluate for admission for possible DKA.  She denied being diagnosed with diabetes mellitus previously, however,  indicated that her primary care physician, Dr. Harrington Pollard, told her to watch her  sugars and carbohydrates since she was at risk of diabetes.  She has not  had any visual changes, polyuria, or polydipsia.   According to admission H&P by Dr. Doree Barthel, the patient's admitting  vital signs were notable for tachycardia of 111 per minute with respiratory  rate of 20 and BP of 140/68.  She was said to have  looked unkempt and not in  acute distress.  She had dry mucous membranes, and lungs were clear to  auscultation.  Cardiac exam was negative for any gallops or murmur.  Abdomen  was soft and nontender.  Exam of the abdomen revealed mild epigastric  tenderness.  She did not have any clubbing, and neurologic testing was  negative for any focal deficits.   Admitting laboratory testing revealed sodium of 137, potassium of 5.2,  chloride of 87, bicarbonate of 14, glucose of 460, BUN of 47, creatinine of  2.9, calcium of 7, anion gap of 33.  Her acetone level was moderately  elevated.  The patient had fallen apparently and therefore had spinal films  and CT of the head which were negative on acute issues.  In view of her  hyperglycemia with decreased bicarbonate, moderate acetone and increased  anion gap, she was admitted for diabetic ketoacidosis/newly diagnosed  diabetes mellitus.   HOSPITAL COURSE:  The patient was admitted to hospitalist service.  She  received IV fluid supplementation and IV insulin per Glucomander protocol.  Her electrolytes were judiciously monitored and corrected appropriately  including  her hypokalemia.  With these measures, her anion gap closed with  normalization of her bicarbonate, and therefore the IV insulin was  discontinued, and her IV fluid was appropriately adjusted.  She was started  on Amaryl and covered with sliding-scale insulin.  While she was in the  hospital, __________ of Glucomander with IV regular insulin.  Her CBGs were  mainly above 200; however, with the initiation of Amaryl, they came down to  below 200.  She will need continued followup at the outpatient level and has  been referred to outpatient diabetes center for education regarding diabetes  and diabetes care.  The patient will need other outpatient followup with  respect to her diabetes including dilated funduscopic eye exam, podiatry  evaluation, and __________.   As mentioned above, the patient had reported one episode of coffee-ground  vomitus.  In the hospital, there was no evidence of continued GI bleed.  We  decided to put her on a b.i.d. dose of proton pump inhibitor.  Her  hemoglobin remained fairly stable, and she did not have any vomitus  episodes.  Her stool for Hemoccult was, however, positive.  I called Dr.  Harrington Pollard, the patient's PCP, this morning and discussed the patient's case with  him, and we agreed that the patient will be needing an early follow up  appointment for further review in this regard, at which point decision will  be made whether patient will need a referral to GI for rescoping of upper  GI.   During hospitalization, the patient had low-grade fever.  Therefore, urine  cultures and blood cultures were drawn.  Blood cultures did not reveal any  growth at the time of discharge.  She will need to get outpatient follow up  of this by PCP.  However, her urine culture grew positive for E. coli.  The  sensitivity part was not yet in at time of discharge.  The patient has  therefore been started on ciprofloxacin for coverage of her E. coli urinary tract infection.  On  rounds this morning, [name] feels well.  She  denies any fever or chills.  Her energy and endurance is better.  She denies  any chest pain or shortness of breath.  She denies any heartburn.  She  denies any abdominal pain.  She has been able to walk around the  floor  without any problem.  She did not have any dizziness, was ambulating.  Her  vital signs were notable for BP of 117/72, pulse rate of 78, respiratory  rate of 18, temp of 98.3 degrees F.  Her CBG was 184 mg/dl.  She looks well.  Her mucous membranes are moist without exudate or erythema.  Her neck is  supple.  Lungs are clear to auscultation bilaterally.  Cardiac exam revealed  regular rate and rhythm without any gallops or murmur.  Abdomen is soft.  She did not have any tenderness.  Bowel sounds were present.  They were  normoactive.  She did not have edema of her extremities.  She is alert and  appropriate.  She is therefore going to be discharged home today for follow  up with her primary care physician, as stated.   SPECIAL INSTRUCTIONS:  The patient has been instructed to stop drinking  alcohol and consider joining AA.  She is to report to her doctor or ED if  she has any problems including dizziness, weakness, fever, or chills or  nausea.  Follow up will be with Dr. Harrington Pollard on Monday, November 09, 2003, at 10  in the morning.  Her diet will be a 2000-calorie ADA diet.   I spent some time to discuss with patient all the work-up that was done  during her hospitalization, the rationale behind these work-ups, the  outpatient plan of care, and also some diabetic education regarding diabetes  and need to be compliant with her medications and the chronicity of this  ailment, i.e. diabetes.  All of her questions were appropriately and  satisfactorily answered.   I spent more than 30 minutes preparing Ms. Nowling for discharge today.   CONDITION ON DISCHARGE:  Improved and satisfactory.                                                Benito Mccreedy, M.D.    GO/MEDQ  D:  11/06/2003  T:  11/06/2003  Job:  [id number]   cc:   C. Melinda Crutch, M.D.  [address]  [place]  [place] [address]  Fax: [phone]

## 2010-08-12 NOTE — H&P (Signed)
Anita Pollard, Anita Pollard NO.:  [id number]   MEDICAL RECORD NO.:  [id number]                   PATIENT TYPE:  IPS   LOCATION:  0507                                 FACILITY:  [facility]   PHYSICIAN:  Rulon Eisenmenger, M.D.              DATE OF BIRTH:  1940-11-15   DATE OF ADMISSION:  03/04/2002  DATE OF DISCHARGE:                         PSYCHIATRIC ADMISSION ASSESSMENT   IDENTIFYING INFORMATION:  This is a 70 year old white female who is widowed.  Voluntary admission.   HISTORY OF PRESENT ILLNESS:  This patient was brought to the hospital by her  children for gradual increase in depression and because they were concerned  about her drinking.  She reports that her children told her that she was  just no longer herself and they were afraid for her safety.  The patient's  husband of 75 years died of leukemia in 2001-01-14.  The patient  reports he was my best friend and I've never gotten over it.  The patient  has had passive suicidal ideation with not caring about life, as she puts  it, though no active plan for suicide.  She endorses poor sleep with  spontaneous tearing and crying on a daily basis, at least for the past 2-4  weeks but gradually worse over the past year.  She also reports increased  anhedonia, poor concentration with decision-making being impaired.  She is  unable to think about goals or organize herself to attend to her daily  activities.  She has lost approximately 15-20 pounds within the past year  and her physician advised her to drink Slim Fast as a supplement to her  regular diet to gain weight but she has not had enough appetite to do that.  She reports abusing alcohol, drinking 2-3 drinks daily ever since she  married her deceased husband at the age of 42.  It was their pattern to  begin drinking in the afternoon during the cocktail hour and drink about  three drinks daily.  She states they have done this all their married life  and, prior to his death, he had had problems with legal charges and they had  had some conflict in their marriage regarding his drinking.  The patient,  herself, has no legal charges against her.  However, she did fall Sunday,  March 02, 2002, and bruised and scraped her right elbow.  The patient  denies that she has any homicidal ideation.  Admits that she is depressed  and that the alcohol is affecting her mood.  Feels that she needs help with  detox.  She reports her longest period ever sober was six weeks after a  total hip replacement in 1999.   PAST PSYCHIATRIC HISTORY:  This is the patient's first inpatient psychiatric  admission and her first psychiatric treatment.  The patient has had no other  prior treatment for her depression over the past  year except for the hospice  grief counseling group, which she has been attending.  History of alcohol  abuse as noted above.  The patient denies any prior history of suicidal  ideation or suicide attempts.   SOCIAL HISTORY:  The patient has some college education.  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  Her first marriage  lasted 53 years and she has two grown children from that marriage.  She was  divorced at age 29 and remarried at age 37 and was married until her husband  died in 2001/01/02 of leukemia.  She has felt lonely and grief-  stricken since his death.  The patient has one stepdaughter from her second  marriage and she has a total of 10 grandchildren.  She denies any legal  problems or financial stressors.   FAMILY HISTORY:  Unremarkable.   ALCOHOL/DRUG HISTORY:  The patient's alcohol abuse is noted above.  She  denies abusing any other substances.   PAST MEDICAL HISTORY:  The patient is followed by Dr. Harrington Challenger at [facility].  Medical problems include hypothyroidism, osteoarthritis  affecting primarily her hands and feet and she has a contusion of her left  elbow,  which appears to be healing well.  Past medical history is remarkable  for a total hip replacement on the right in 1999 and patient had a fractured  patella several years ago on the left, which is also pinned.  She had a  hysterectomy at age 71.   MEDICATIONS:  Synthroid 88 mcg daily p.o., Zoloft 100 mg daily (which  patient was first prescribed when her husband was sick with leukemia.  She  has taken this for approximately a year and has been on the 100 mg dosage as  long as she can remember).  The patient also takes Mobic for her arthritis  daily but the dose is unclear and she takes Premarin 0.625 mg p.o. daily for  postmenopausal state.   ALLERGIES:  PENICILLIN (which causes a rash).   REVIEW OF SYSTEMS:  The patient denies any past history of blackouts,  seizures or memory loss.  Although she fell Sunday, she states she had no  blackout from that.  She complains of some mild arthritis pain in her hands  and feet and has been urged to get a steroid injection for right thenar  arthritis.  The patient has no history of cardiac problems or high blood  pressure.  She is compliant with her Synthroid.  The patient denies any  prior history of hepatitis or liver disease and she denies any abdominal  pain or further nausea and vomiting.  She is keeping food and fluids down  today.   PHYSICAL EXAMINATION:  GENERAL:  This is a petite-built, frail-appearing, 37-  year-old female who is mildly disheveled.  She is in no acute distress.  She  is so relaxed and cooperative.  Hygiene is satisfactory.  VITAL SIGNS:  On admission to the unit, temperature 99, pulse 115,  respirations 20, blood pressure 165/88.  She states her usual blood pressure  is more like 120/70.  She is 5 feet 1 inch tall and weighed 111 pounds at  the time of admission and reports a 20-pound weight loss in the past year. SKIN:  Pale in tone and remarkable for a large bruise over her right elbow  with a dime-sized crust with  no signs of infection.  HEAD:  Normocephalic and  atraumatic.  EENT:  PERRLA.  Sclerae are nonicteric.  Hearing intact to normal voice.  No  rhinorrhea.  Oropharynx noninjected.  NECK:  Supple.  No thyromegaly and no palpable nodes.  CARDIOVASCULAR:  S1 and S2 is heard.  No clicks, murmurs, gallops.  No extra  sounds.  Apical pulse is synchronous with radial pulse.  No JVD.  No edema  of extremities.  LUNGS:  Clear to auscultation.  ABDOMEN:  Flat, soft, nontender.  No masses palpable.  GENITALIA:  Deferred.  MUSCULOSKELETAL:  The patient does have enlargement of her joints in her  hands consistent with osteoarthritis.  Strength is 5/5.  She has a slightly  shuffling gait with small steps.  She seems somewhat unsteady on her feet  but is ambulating without any assistive devices.  NEUROLOGIC:  Cranial nerves 2-12 are intact.  EOMs are intact without  nystagmus.  No ataxia.  Gait is shuffling but generally smooth.  No tremor.  Sensory is grossly intact.  Romberg without findings.  Deep tendon reflexes  1+/4 and sluggish but bilaterally equal.   LABORATORY DATA:  Hemoglobin 11.5, hematocrit 33.2, MCV 96, platelets  119,000.  The patient's electrolytes are within normal limits.  Her total  bilirubin is 1.8.  SGOT 167, SGPT 203.  Thyroid studies reveal that her TSH  is 7.728 and her T4 is within normal limits at 6.5.  The patient's  urinalysis and urine drug screen is currently pending.   MENTAL STATUS EXAM:  This is a frail, small-built female who is fully alert  and in no acute distress with a tearful affect.  Her eye contact is good.  She is cooperative, polite but begins to cry immediately when she tries to  speak about her symptoms and the loss of her husband.  I just bring up his  name and she is easily moved to tears.  She is cooperative.  Speech is  normal without pressure.  Mood is depressed.  Thought process is remarkable  for some loss of concentration.  She has some difficulty  gathering her  thoughts but generally she is able to follow the train of conversation.  No  evidence of psychosis.  No homicidal ideation.  No evidence of paranoia or  guarding.  She is positive for suicidal ideation without any clear plan but  is clearly miserable and feels unable to face life or go on without her life  partner.  Cognitively, she is intact and oriented x 3.  Intelligence is  above average.  Insight poor.  Judgment and impulse control questionable.   DIAGNOSES:   AXIS I:  1. Depression, not otherwise specified.  2. Alcohol abuse; rule out dependence.   AXIS II:  No diagnosis.   AXIS III:  1. Hypothyroidism.  2. Elevated liver enzymes probably related to alcohol abuse.  3. Thrombocytopenia probably also related to alcohol abuse.   AXIS IV:  Moderate (grief secondary to the loss of her spouse and grief over his death.  Having a supportive family is an asset to her).   AXIS V:  Current 20; past year 77.   PLAN:  Voluntarily admit the patient to alleviate her neurovegetative  symptoms and her suicidal ideation and to safely detox her within five days.  We are going to check a urine and urine drug screen on her, which has been  ordered but not yet obtained and we have asked her to give [place] a urine  specimen.  Meanwhile, we have placed her  on a phenobarbital protocol.  She  was initially vomiting her Librium and has responded better to the IM  phenobarbital.  So we will continue with that and she is currently  tolerating it well.  Meanwhile, we will continue her Zoloft 100 mg p.o. q.d.  and will titrate that upward after she has made some progress on detox.  Today, we have counseled her regarding the interactions of alcohol and its  effect on mood and brain receptors and she understand and is in agreement.  She does not deny her alcohol abuse but has not made a clear commitment to  abstain at this point.  We have reviewed the plan of care with her and she  is amenable  to it.  She voices her understanding and is in agreement.   ESTIMATED LENGTH OF STAY:  Five days.     Margaret A. Laurita Quint                   Rulon Eisenmenger, M.D.    MAS/MEDQ  D:  03/05/2002  T:  03/05/2002  Job:  [id number]

## 2010-08-12 NOTE — Consult Note (Signed)
Anita Pollard, BUSAM NO.:  [id number]   MEDICAL RECORD NO.:  [id number]                   PATIENT TYPE:  EMS   LOCATION:  ED                                   FACILITY:  [facility]   PHYSICIAN:  Anita Pollard., M.D.          DATE OF BIRTH:  04/13/1940   DATE OF CONSULTATION:  01/29/2003  DATE OF DISCHARGE:                                   CONSULTATION   REASON FOR ADMISSION:  Hematemesis.   HISTORY:  This is a 70 year old Caucasian female with no previous history of  GI bleeding.  She lives alone.  Her daughter called her today and was going  to take her to lunch, and found out that she was vomiting and vomiting  blood, and brought her to the emergency room.  Patient noticed that she had  been vomiting blood for two days, occasionally bright, but usually coffee-  ground material.  Her stools in the emergency room were checked and were  negative.  She was hemodynamically unstable when she first came in with a  pulse of 199, blood pressure of 82/49, with the pressure coming up to 139/85  and pulse down to around 115 by the time that I have seen her with IV  fluids.  She has had no further observed bleeding in the emergency room.  Patient denies ever having had bleeding like this although she does vomit  often and apparently according to her daughter this is from drinking too  much.  There is some uncertainty about how much the patient drinks.  She was  briefly hospitalized at [facility] for evaluation for  drinking and outpatient therapy recommended and she apparently did not  follow up.  She has had no history of ulcers but is taking Prilosec.  She is  unable to tell me why.  Other than the nausea, she has had no recent  abdominal pain.   CURRENT MEDICATIONS:  1. Prilosec 20 mg daily.  2. Wellbutrin XL.  3. Mobic.  4. Levoxyl.   ALLERGIES:  PENICILLIN.   MEDICAL HISTORY:  1. Patient has a history of hypothyroidism, is on  thyroid replacement.  2. Has history of depression, is on Wellbutrin.  3. She also has a long history of alcohol use.   PREVIOUS SURGERY:  Hysterectomy and appendectomy.   FAMILY HISTORY:  Positive for diabetes and negative for cancer or liver  disease.  There is heart disease in distant family members.   SOCIAL HISTORY:  She is widowed, lives alone, has an attentive daughter.  She has been drinking for many years, there is some uncertainty.  She admits  to one drink a day.  Daughter indicates she drinks more than that.  She does  not smoke.   REVIEW OF SYSTEMS:  Remarkable primarily as mentioned above.  She does have  symptoms of some weight loss, not eating.  Denies any  heartburn,  indigestion.  I am not really clear if she is taking Prilosec regularly.  She does have arthritis which is treated with Mobic.   PHYSICAL EXAMINATION:  VITAL SIGNS:  Patient is afebrile, pulse was 159, now  down to 115, blood pressure 139/47.  GENERAL:  An alert white female.  HEENT:  She is nonicteric.  There is no stigmata of chronic liver disease.  Pupils are reactive to light.  Sclerae nonicteric.  HEART:  Regular rate and rhythm without murmurs or gallops.  LUNGS:  Clear.  ABDOMEN:  Soft and completely nontender.  RECTAL:  Not repeated.  Stool was heme-negative by the ER staff.  EXTREMITIES:  No edema on the right leg but appears 1-2+ edema on the left  leg.   Pertinent labs reveal a normal ProTime at 13, hemoglobin of 13, GOT of 156,  GPT of 113, and alk phos of 97.   ASSESSMENT:  1. Hematemesis possibly due to varices versus ulcer due to Mobic.  Patient     does have a normal PT but does have a history of elevated transaminases     and history of drinking so I think all these are possible explanations.  2. Left lower extremity swelling, cause is not clear.  3. History of occult alcohol abuse.   We are going ahead with endoscopy this evening with plans to perform control  of bleeding or  esophageal variceal banding if needed.  I have discussed this  with the patient and with her daughter who is in attendance.                                               Anita Pollard., M.D.    Anita Pollard  D:  01/29/2003  T:  01/29/2003  Job:  [id number]   cc:   C. Melinda Crutch, M.D.  [address]  [place]  [place] [address]  Fax: [phone]

## 2010-08-24 ENCOUNTER — Other Ambulatory Visit: Payer: Self-pay

## 2010-08-25 ENCOUNTER — Ambulatory Visit
Admission: RE | Admit: 2010-08-25 | Discharge: 2010-08-25 | Disposition: A | Discharge status: Home / Self Care | Payer: 59 | Source: Ambulatory Visit | Attending: Obstetrics and Gynecology | Admitting: Obstetrics and Gynecology

## 2010-08-25 DIAGNOSIS — Z780 Asymptomatic menopausal state: Secondary | ICD-10-CM

## 2010-08-25 DIAGNOSIS — Z9889 Other specified postprocedural states: Secondary | ICD-10-CM

## 2010-09-05 ENCOUNTER — Other Ambulatory Visit: Payer: Self-pay | Admitting: Oncology

## 2010-09-05 ENCOUNTER — Encounter (HOSPITAL_BASED_OUTPATIENT_CLINIC_OR_DEPARTMENT_OTHER): Payer: Medicare Other | Admitting: Oncology

## 2010-09-05 DIAGNOSIS — N6089 Other benign mammary dysplasias of unspecified breast: Secondary | ICD-10-CM

## 2010-09-05 DIAGNOSIS — C50219 Malignant neoplasm of upper-inner quadrant of unspecified female breast: Secondary | ICD-10-CM

## 2010-09-05 DIAGNOSIS — Z170 Estrogen receptor positive status [ER+]: Secondary | ICD-10-CM

## 2010-09-05 LAB — COMPREHENSIVE METABOLIC PANEL
Albumin: 4.4 g/dL (ref 3.5–5.2)
BUN: 27 mg/dL — ABNORMAL HIGH (ref 6–23)
CO2: 25 mEq/L (ref 19–32)
Calcium: 9 mg/dL (ref 8.4–10.5)
Chloride: 102 mEq/L (ref 96–112)
Creatinine, Ser: 0.95 mg/dL (ref 0.50–1.10)
Potassium: 3.8 mEq/L (ref 3.5–5.3)

## 2010-09-05 LAB — CBC WITH DIFFERENTIAL/PLATELET
Basophils Absolute: 0 10*3/uL (ref 0.0–0.1)
Eosinophils Absolute: 0 10*3/uL (ref 0.0–0.5)
HCT: 37.1 % (ref 34.8–46.6)
HGB: 12.8 g/dL (ref 11.6–15.9)
MONO#: 0.5 10*3/uL (ref 0.1–0.9)
NEUT#: 5.3 10*3/uL (ref 1.5–6.5)
NEUT%: 77.4 % — ABNORMAL HIGH (ref 38.4–76.8)
WBC: 6.8 10*3/uL (ref 3.9–10.3)
lymph#: 1 10*3/uL (ref 0.9–3.3)

## 2010-09-07 ENCOUNTER — Other Ambulatory Visit: Payer: Self-pay

## 2010-09-09 ENCOUNTER — Encounter (HOSPITAL_BASED_OUTPATIENT_CLINIC_OR_DEPARTMENT_OTHER): Payer: Medicare Other | Admitting: Oncology

## 2010-09-09 DIAGNOSIS — C50919 Malignant neoplasm of unspecified site of unspecified female breast: Secondary | ICD-10-CM

## 2010-11-18 ENCOUNTER — Other Ambulatory Visit: Payer: Self-pay | Admitting: Gastroenterology

## 2010-11-18 DIAGNOSIS — R195 Other fecal abnormalities: Secondary | ICD-10-CM

## 2010-11-23 ENCOUNTER — Telehealth: Payer: Self-pay | Admitting: Cardiology

## 2010-11-23 NOTE — Telephone Encounter (Signed)
Please schedule a carotid ultrasound 433.1 and f/u with [place] in December.

## 2010-11-23 NOTE — Telephone Encounter (Signed)
Pt called to set up dec fu appt, thought she needed an [place] , wants to know if dr Lia Foyer wants to order

## 2010-12-02 ENCOUNTER — Ambulatory Visit
Admission: RE | Admit: 2010-12-02 | Discharge: 2010-12-02 | Disposition: A | Discharge status: Home / Self Care | Payer: Medicare Other | Source: Ambulatory Visit | Attending: Gastroenterology | Admitting: Gastroenterology

## 2010-12-02 DIAGNOSIS — R195 Other fecal abnormalities: Secondary | ICD-10-CM

## 2011-02-28 ENCOUNTER — Encounter (INDEPENDENT_AMBULATORY_CARE_PROVIDER_SITE_OTHER): Payer: Medicare Other | Admitting: *Deleted

## 2011-02-28 DIAGNOSIS — I6529 Occlusion and stenosis of unspecified carotid artery: Secondary | ICD-10-CM

## 2011-03-01 ENCOUNTER — Other Ambulatory Visit: Payer: Self-pay | Admitting: Dermatology

## 2011-03-14 ENCOUNTER — Ambulatory Visit: Payer: Medicare Other | Admitting: Cardiology

## 2011-04-20 ENCOUNTER — Encounter: Payer: Self-pay | Admitting: *Deleted

## 2011-04-27 ENCOUNTER — Encounter: Payer: Self-pay | Admitting: Cardiology

## 2011-04-27 ENCOUNTER — Ambulatory Visit (INDEPENDENT_AMBULATORY_CARE_PROVIDER_SITE_OTHER): Payer: Medicare Other | Admitting: Cardiology

## 2011-04-27 DIAGNOSIS — I10 Essential (primary) hypertension: Secondary | ICD-10-CM

## 2011-04-27 DIAGNOSIS — I6529 Occlusion and stenosis of unspecified carotid artery: Secondary | ICD-10-CM

## 2011-04-27 NOTE — Progress Notes (Signed)
HPI:  She is doing well.  Not having any chest pain.  Doing a lot of volunteer work.  Denies progressive symptoms.   Current Outpatient Prescriptions  Medication Sig Dispense Refill  . amLODipine (NORVASC) 10 MG tablet Take 10 mg by mouth daily.      Marland Kitchen ascorbic acid (VITAMIN C) 500 MG tablet Take 500 mg by mouth daily.      Marland Kitchen aspirin 81 MG tablet Take 81 mg by mouth daily.      Marland Kitchen b complex vitamins tablet Take 1 tablet by mouth daily.      . calcium carbonate (OS-CAL) 600 MG TABS Take 600 mg by mouth 2 (two) times daily with a meal.      . Casanthranol-Docusate Sodium 30-100 MG CAPS Take 100 mg by mouth at bedtime.      . Cholecalciferol (VITAMIN D-3) 5000 UNITS TABS Take 5,000 Units by mouth 2 (two) times daily.       Marland Kitchen co-enzyme Q-10 30 MG capsule Take 300 mg by mouth daily.      . diphenhydrAMINE (BENADRYL) 25 MG tablet Take 25 mg by mouth at bedtime as needed.      . Glucosamine-Chondroit-Vit C-Mn (GLUCOSAMINE 1500 COMPLEX) CAPS Take by mouth 3 (three) times daily.      Marland Kitchen levothyroxine (SYNTHROID, LEVOTHROID) 150 MCG tablet Take 150 mcg by mouth daily.      . magnesium oxide (MAG-OX) 400 MG tablet Take 400 mg by mouth daily.      . milk thistle 175 MG tablet Take 1,000 mg by mouth 2 (two) times daily.      . Multiple Vitamin (MULTIVITAMIN) tablet Take 1 tablet by mouth daily.      . Omega 3-6-9 Fatty Acids (OMEGA 3-6-9 COMPLEX PO) Take 800 mg by mouth 2 (two) times daily.      Marland Kitchen omeprazole (PRILOSEC) 20 MG capsule Take 20 mg by mouth daily.        Allergies  Allergen Reactions  . Penicillins     Past Medical History  Diagnosis Date  . Hypothyroidism   . GERD (gastroesophageal reflux disease)   . HTN (hypertension)   . Diabetes mellitus     Past Surgical History  Procedure Date  . Thyroidectomy   . Ductal carcinoma     right breast   . Abdominoplasty     Family History  Problem Relation Age of Onset  . Thyroid cancer Daughter 37  . Diabetes Father   . Diabetes  Brother     History   Social History  . Marital Status: Widowed    Spouse Name: N/A    Number of Children: N/A  . Years of Education: N/A   Occupational History  . Not on file.   Social History Main Topics  . Smoking status: Former Research scientist (life sciences)  . Smokeless tobacco: Not on file  . Alcohol Use: Yes     socially  . Drug Use: Not on file  . Sexually Active: Not on file   Other Topics Concern  . Not on file   Social History Narrative  . No narrative on file    ROS: Please see the HPI.  All other systems reviewed and negative.  PHYSICAL EXAM:  BP 126/82  Pulse 80  Ht 5\' 1"  (1.549 m)  Wt 64.411 kg (142 lb)  BMI 26.83 kg/m2  General: Well developed, well nourished, in no acute distress. Head:  Normocephalic and atraumatic. Neck: no JVD Lungs: Clear to auscultation and percussion.  Heart: Normal S1 and S2.  No murmur, rubs or gallops.  Abdomen:  Normal bowel sounds; soft; non tender; no organomegaly Pulses: Pulses normal in all 4 extremities. Extremities: No clubbing or cyanosis. No edema. Neurologic: Alert and oriented x 3.  EKG:  NSR.  WNL.  Minor non specific ST changes.   ASSESSMENT AND PLAN:

## 2011-04-27 NOTE — Patient Instructions (Addendum)
Your physician wants you to follow-up in: 1 YEAR.  You will receive a reminder letter in the mail two months in advance. If you don't receive a letter, please call our office to schedule the follow-up appointment.  Your physician recommends that you continue on your current medications as directed. Please refer to the Current Medication list given to you today.  Your physician has requested that you have a carotid duplex in 1 YEAR (December 2013). This test is an ultrasound of the carotid arteries in your neck. It looks at blood flow through these arteries that supply the brain with blood. Allow one hour for this exam. There are no restrictions or special instructions.

## 2011-05-09 NOTE — Assessment & Plan Note (Signed)
Well-controlled at the present time. ?

## 2011-05-09 NOTE — Assessment & Plan Note (Signed)
Stable.  See last report.

## 2011-05-09 NOTE — Progress Notes (Signed)
Addended by: Doug Sou D on: 05/09/2011 10:17 AM   Modules accepted: Orders

## 2011-05-24 ENCOUNTER — Telehealth: Payer: Self-pay | Admitting: Oncology

## 2011-05-24 NOTE — Telephone Encounter (Signed)
lmonvm adviisng the pt of her appts in June 2013. lmonvm of the desk nurse regarding the pt's order for the mammo/mri breast

## 2011-05-26 ENCOUNTER — Other Ambulatory Visit: Payer: Self-pay | Admitting: *Deleted

## 2011-05-26 DIAGNOSIS — Z853 Personal history of malignant neoplasm of breast: Secondary | ICD-10-CM

## 2011-06-06 ENCOUNTER — Other Ambulatory Visit: Payer: Self-pay | Admitting: Obstetrics and Gynecology

## 2011-08-10 ENCOUNTER — Telehealth: Payer: Self-pay | Admitting: Oncology

## 2011-08-10 NOTE — Telephone Encounter (Signed)
Pt called in to get her mri breast appt scheduled. lmonvm of Anita Pollard to set up the pt's mri at [facility].

## 2011-08-24 ENCOUNTER — Other Ambulatory Visit (HOSPITAL_BASED_OUTPATIENT_CLINIC_OR_DEPARTMENT_OTHER): Payer: Medicare Other | Admitting: Lab

## 2011-08-24 DIAGNOSIS — D0590 Unspecified type of carcinoma in situ of unspecified breast: Secondary | ICD-10-CM

## 2011-08-24 DIAGNOSIS — N6089 Other benign mammary dysplasias of unspecified breast: Secondary | ICD-10-CM

## 2011-08-24 DIAGNOSIS — Z170 Estrogen receptor positive status [ER+]: Secondary | ICD-10-CM

## 2011-08-24 LAB — CBC WITH DIFFERENTIAL/PLATELET
Basophils Absolute: 0 10*3/uL (ref 0.0–0.1)
Eosinophils Absolute: 0.2 10*3/uL (ref 0.0–0.5)
LYMPH%: 37.7 % (ref 14.0–49.7)
MCV: 93 fL (ref 79.5–101.0)
MONO%: 6.9 % (ref 0.0–14.0)
NEUT#: 2.3 10*3/uL (ref 1.5–6.5)
Platelets: 167 10*3/uL (ref 145–400)
RBC: 3.85 10*6/uL (ref 3.70–5.45)

## 2011-08-24 LAB — COMPREHENSIVE METABOLIC PANEL
Alkaline Phosphatase: 65 U/L (ref 39–117)
BUN: 22 mg/dL (ref 6–23)
Creatinine, Ser: 0.81 mg/dL (ref 0.50–1.10)
Glucose, Bld: 90 mg/dL (ref 70–99)
Sodium: 140 mEq/L (ref 135–145)
Total Bilirubin: 0.3 mg/dL (ref 0.3–1.2)

## 2011-08-28 ENCOUNTER — Ambulatory Visit
Admission: RE | Admit: 2011-08-28 | Discharge: 2011-08-28 | Disposition: A | Discharge status: Home / Self Care | Payer: Medicare Other | Source: Ambulatory Visit | Attending: Oncology | Admitting: Oncology

## 2011-08-28 DIAGNOSIS — Z853 Personal history of malignant neoplasm of breast: Secondary | ICD-10-CM

## 2011-08-28 MED ORDER — GADOBENATE DIMEGLUMINE 529 MG/ML IV SOLN: 14.0000 mL | Freq: Once | INTRAVENOUS | Status: AC | PRN

## 2011-08-31 ENCOUNTER — Telehealth: Payer: Self-pay | Admitting: Oncology

## 2011-08-31 ENCOUNTER — Ambulatory Visit (HOSPITAL_BASED_OUTPATIENT_CLINIC_OR_DEPARTMENT_OTHER): Payer: Medicare Other | Admitting: Oncology

## 2011-08-31 VITALS — BP 123/77 | HR 62 | Temp 98.0°F | Ht 61.0 in | Wt 140.8 lb

## 2011-08-31 DIAGNOSIS — C50919 Malignant neoplasm of unspecified site of unspecified female breast: Secondary | ICD-10-CM | POA: Insufficient documentation

## 2011-08-31 DIAGNOSIS — D0590 Unspecified type of carcinoma in situ of unspecified breast: Secondary | ICD-10-CM

## 2011-08-31 DIAGNOSIS — Z170 Estrogen receptor positive status [ER+]: Secondary | ICD-10-CM

## 2011-08-31 NOTE — Progress Notes (Signed)
ID: [name]   DOB: December 28, 1940  MR#: [id number]  [id number]  HISTORY OF PRESENT ILLNESS: Anita Pollard had some calcifications noted in 01/2008, and was recalled for a 56-month right mammogram for close followup 07/28/2008.  This showed a 5 mm cluster of microcalcifications in the medial right breast, which have increased in number and varied in size and shape.  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  There were scattered foci of free floating carcinoma cells, but no definite evidence of stromal invasion.  The cells were 100% estrogen and 89% progesterone receptor positive.  With this information, the patient was referred to Dr. Brantley Stage, and bilateral breast MRIs were obtained 05/17.  They showed post biopsy changes on the right, but no additional areas of abnormal enhancement in either breast, the internal mammary area, or either axilla.  Accordingly after appropriate discussion on 08/26/2008, Dr. Brantley Stage proceeded to needle localization lumpectomy with sentinel lymph node sampling, and the final pathology there [id number]) showed no residual ductal carcinoma in situ.  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  All four of the sentinel lymph nodes were negative.  The total tumor size therefore had to be estimated from the prior core biopsy, and it was 4 mm.  The tumor was felt to be high grade, and margins of the lumpectomy appeared negative.  The patient's subsequent history is as detailed below.  INTERVAL HISTORY: Anita Pollard returns today for followup of her breast cancer. Our mutual friend, did quite, was also a member of her church, has died since her last visit here  R and this was significant to her. She does not have a lot of family in this area. However she "keeps busy" I with her housework, being Physiological scientist. of 3 different clubs, church volunteering, and  walking at least 5 times a week.  REVIEW OF SYSTEMS:  she twisted her ankle and has not been walking as much for the last week and a half as a result, but "that's getting better". She has had no unusual headaches, visual changes, cough, phlegm production, pleurisy, shortness of breath, or change in bowel or bladder habits. A detailed review of systems today was entirely negative.  PAST MEDICAL HISTORY: Past Medical History  Diagnosis Date  . Hypothyroidism   . GERD (gastroesophageal reflux disease)   . HTN (hypertension)   . Diabetes mellitus     PAST SURGICAL HISTORY: Past Surgical History  Procedure Date  . Thyroidectomy   . Ductal carcinoma     right breast   . Abdominoplasty     FAMILY HISTORY Family History  Problem Relation Age of Onset  . Thyroid cancer Daughter 59  . Diabetes Father   . Diabetes Brother   The patient's father died in an automobile accident at age 33.  The patient's mother died at age 61 from unclear causes.  The patient has 6 siblings.  There is no breast or ovarian cancer in the immediate family.  One sister did have multiple myeloma.  However, the patient has "double first cousins" (the patient's mother's sister married the patient's father's brother), and one of them recently died from ovarian cancer at the age of 28.  GYNECOLOGIC HISTORY: The patient is GX, P2.  First pregnancy to term at age 60.  She did not nurse.  She took hormone replacement for approximately 10 years, and stopped about 2001.  She had no complications  from this.  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  She has been retired 15 years.  Her husband was an Chief Financial Officer.  He died from complications of chronic lymphoid leukemia at the age of 26.  Her son, Anita Pollard, is 31.  Lives in [place], and is in sales.  Her daughter, Anita Pollard, lives in [place], and she is a homemaker with 6 children.  The patient has a total of 10 grandchildren.  She is a  Tourist information centre manager.   ADVANCED DIRECTIVES:  HEALTH MAINTENANCE: History  Substance Use Topics  . Smoking status: Former Research scientist (life sciences)  . Smokeless tobacco: Not on file  . Alcohol Use: Yes     socially     Colonoscopy:  PAP:  Bone density:  Lipid panel:  Allergies  Allergen Reactions  . Penicillins     Current Outpatient Prescriptions  Medication Sig Dispense Refill  . amLODipine (NORVASC) 10 MG tablet Take 10 mg by mouth daily.      Marland Kitchen ascorbic acid (VITAMIN C) 500 MG tablet Take 500 mg by mouth daily.      Marland Kitchen aspirin 81 MG tablet Take 81 mg by mouth daily.      Marland Kitchen b complex vitamins tablet Take 1 tablet by mouth daily.      . calcium carbonate (OS-CAL) 600 MG TABS Take 600 mg by mouth 2 (two) times daily with a meal.      . Cholecalciferol (VITAMIN D-3) 5000 UNITS TABS Take 5,000 Units by mouth 2 (two) times daily.       Marland Kitchen co-enzyme Q-10 30 MG capsule Take 300 mg by mouth daily.      . diphenhydrAMINE (BENADRYL) 25 MG tablet Take 25 mg by mouth at bedtime as needed.      . Glucosamine-Chondroit-Vit C-Mn (GLUCOSAMINE 1500 COMPLEX) CAPS Take by mouth 3 (three) times daily.      Marland Kitchen levothyroxine (SYNTHROID, LEVOTHROID) 150 MCG tablet Take 150 mcg by mouth daily.      . magnesium oxide (MAG-OX) 400 MG tablet Take 400 mg by mouth daily.      . Multiple Vitamin (MULTIVITAMIN) tablet Take 1 tablet by mouth daily.      . Omega 3-6-9 Fatty Acids (OMEGA 3-6-9 COMPLEX PO) Take 800 mg by mouth 2 (two) times daily.      Marland Kitchen omeprazole (PRILOSEC) 20 MG capsule Take 20 mg by mouth daily.      Sarajane Marek Sodium 30-100 MG CAPS Take 100 mg by mouth at bedtime.      . milk thistle 175 MG tablet Take 1,000 mg by mouth 2 (two) times daily.        OBJECTIVE: Elderly white woman who appears well  Filed Vitals:   08/31/11 1346  BP: 123/77  Pulse: 62  Temp: 98 F (36.7 C)     Body mass index is 26.60 kg/(m^2).    ECOG FS: 1  Sclerae unicteric Oropharynx clear No peripheral adenopathy Lungs no  rales or rhonchi Heart regular rate and rhythm Abd benign MSK no focal spinal tenderness, no peripheral edema Neuro: nonfocal Breasts:  the right breast is status post lumpectomy. There is no evidence of local recurrence. The left breast is unremarkable   LAB RESULTS: Lab Results  Component Value Date   WBC 4.6 08/24/2011   NEUTROABS 2.3 08/24/2011   HGB 12.3 08/24/2011   HCT 35.8 08/24/2011   MCV 93.0 08/24/2011   PLT 167 08/24/2011      Chemistry  Component Value Date/Time   NA 140 08/24/2011 1428   K 3.9 08/24/2011 1428   CL 104 08/24/2011 1428   CO2 27 08/24/2011 1428   BUN 22 08/24/2011 1428   CREATININE 0.81 08/24/2011 1428      Component Value Date/Time   CALCIUM 8.7 08/24/2011 1428   ALKPHOS 65 08/24/2011 1428   AST 14 08/24/2011 1428   ALT 17 08/24/2011 1428   BILITOT 0.3 08/24/2011 1428       Lab Results  Component Value Date   LABCA2 15 08/25/2008    No components found with this basename: [id number]    No results found for this basename: INR:1;PROTIME:1 in the last 168 hours  Urinalysis    Component Value Date/Time   COLORURINE YELLOW 04/24/2008 [place] 04/24/2008 1007   LABSPEC 1.014 04/24/2008 1007   PHURINE 6.0 04/24/2008 1007   GLUCOSEU NEGATIVE 04/24/2008 [place] 04/24/2008 [place] 04/24/2008 1007   KETONESUR NEGATIVE 04/24/2008 1007   PROTEINUR NEGATIVE 04/24/2008 1007   UROBILINOGEN 0.2 04/24/2008 1007   NITRITE NEGATIVE 04/24/2008 1007   LEUKOCYTESUR TRACE* 04/24/2008 1007    STUDIES: Mr Breast Bilateral W Wo Contrast  08/28/2011  *RADIOLOGY REPORT*  Clinical Data: History of right breast cancer status post lumpectomy and 2010  BILATERAL BREAST MRI WITH AND WITHOUT CONTRAST  Technique: Multiplanar, multisequence MR images of both breasts were obtained prior to and following the intravenous administration of 57ml of multihance.  Three dimensional images were evaluated at the independent DynaCad workstation.   Comparison:  Prior MRI dated 08/19/2009 and mammograms dated 08/28/2011 and 08/25/2010  Findings: There is a moderate background parenchymal enhancement pattern.  Lumpectomy changes are noted in the right breast.  There is no suspicious enhancement in either breast.  There is no enlarged axillary or internal mammary adenopathy.  IMPRESSION: Negative breast MRI.  Diagnostic mammogram in 1 year is recommended.  THREE-DIMENSIONAL MR IMAGE RENDERING ON INDEPENDENT WORKSTATION:  Three-dimensional MR images were rendered by post-processing of the original MR data on an independent workstation.  The three- dimensional MR images were interpreted, and findings were reported in the accompanying complete MRI report for this study.  BI-RADS CATEGORY 2:  Benign finding(s).  Original Report Authenticated By: Jeanie Cooks. Miquel Dunn, M.D.   [facility] Digital [facility]  08/28/2011  ** RADIOLOGY REPORT **  Clinical Data:  71 year old female with history of right breast cancer and lumpectomy in 2010.  Annual bilateral mammograms.  DIGITAL DIAGNOSTIC BILATERAL MAMMOGRAM with CAD  Comparison: 08/25/2010 and prior mammograms dating back to 01/29/2008.  Findings: MLO and CC views of both breasts and a spot tangential view of the lumpectomy site performed.  Scarring in the right breast again identified.  There is no evidence of mass, nonsurgical distortion, or suspicious calcifications. Scattered fibroglandular densities bilaterally are again noted.  Mammographic images were processed with CAD.  IMPRESSION: No specific mammographic evidence of malignancy.  Right breast scarring.  These findings were discussed with the patient.  She was encouraged to begin/continue monthly self exams and to contact her primary physician if any changes noted.  BI-RADS CATEGORY 2:  Benign finding(s).  Recommend bilateral diagnostic mammograms in 1 year.  Original Report Authenticated By: Lura Em, M.D.    ASSESSMENT: 71 y.o. 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  Status post right lumpectomy in June 2010, showing no residual tumor and 0 of 4 sentinel lymph nodes involved.  Refused adjuvant therapy, now followed with observation alone and no clinical evidence of recurrence.   PLAN: Kelie is doing very well, with no evidence of breast cancer recurrence. She is going to see [place] again in one year, with lab work before that visit. She will also have mammography the week before. She will then see [place] again on may 2015 and likely we will release her from followup at that time.    Garyson Stelly C    08/31/2011

## 2011-08-31 NOTE — Telephone Encounter (Signed)
gve the pt her June 2014 appt calendar 

## 2012-03-18 ENCOUNTER — Other Ambulatory Visit: Payer: Self-pay

## 2012-03-18 DIAGNOSIS — I6529 Occlusion and stenosis of unspecified carotid artery: Secondary | ICD-10-CM

## 2012-04-01 ENCOUNTER — Encounter (INDEPENDENT_AMBULATORY_CARE_PROVIDER_SITE_OTHER): Payer: Medicare Other

## 2012-04-01 DIAGNOSIS — I6529 Occlusion and stenosis of unspecified carotid artery: Secondary | ICD-10-CM

## 2012-05-03 ENCOUNTER — Encounter: Payer: Self-pay | Admitting: Cardiology

## 2012-05-03 ENCOUNTER — Ambulatory Visit (INDEPENDENT_AMBULATORY_CARE_PROVIDER_SITE_OTHER): Payer: Medicare Other | Admitting: Cardiology

## 2012-05-03 VITALS — BP 128/66 | HR 58 | Ht 61.0 in | Wt 129.0 lb

## 2012-05-03 DIAGNOSIS — I6529 Occlusion and stenosis of unspecified carotid artery: Secondary | ICD-10-CM

## 2012-05-03 DIAGNOSIS — I10 Essential (primary) hypertension: Secondary | ICD-10-CM

## 2012-05-03 NOTE — Progress Notes (Signed)
HPI:  She is doing well. She on occasion notices some brief short palpitation-like symptoms. These are never prolonged for any duration. She is able to do most of what she wants without any limitation or chest pain.  Current Outpatient Prescriptions  Medication Sig Dispense Refill  . amLODipine (NORVASC) 10 MG tablet Take 10 mg by mouth daily.      Marland Kitchen aspirin 81 MG tablet Take 81 mg by mouth daily.      Marland Kitchen b complex vitamins tablet Take 1 tablet by mouth daily.      . calcium carbonate (OS-CAL) 600 MG TABS Take 600 mg by mouth 2 (two) times daily with a meal.      . Casanthranol-Docusate Sodium 30-100 MG CAPS Take 100 mg by mouth at bedtime.      . Cholecalciferol (VITAMIN D-3) 5000 UNITS TABS Take 5,000 Units by mouth 2 (two) times daily.       Marland Kitchen co-enzyme Q-10 30 MG capsule Take 300 mg by mouth daily.      . diphenhydrAMINE (BENADRYL) 25 MG tablet Take 25 mg by mouth at bedtime as needed.      . Glucosamine-Chondroit-Vit C-Mn (GLUCOSAMINE 1500 COMPLEX) CAPS Take by mouth 3 (three) times daily.      Marland Kitchen KRILL OIL 1000 MG CAPS Take one tablet daily      . levothyroxine (SYNTHROID, LEVOTHROID) 150 MCG tablet Take 150 mcg by mouth daily.      . magnesium oxide (MAG-OX) 400 MG tablet Take 400 mg by mouth daily.      . Multiple Vitamin (MULTIVITAMIN) tablet Take 1 tablet by mouth daily.      Marland Kitchen omeprazole (PRILOSEC) 20 MG capsule Take 20 mg by mouth daily.      . Probiotic Product (PROBIOTIC DAILY) CAPS Take one capsule every other day        Allergies  Allergen Reactions  . Penicillins     Past Medical History  Diagnosis Date  . Hypothyroidism   . GERD (gastroesophageal reflux disease)   . HTN (hypertension)   . Diabetes mellitus     Past Surgical History  Procedure Date  . Thyroidectomy   . Ductal carcinoma     right breast   . Abdominoplasty     Family History  Problem Relation Age of Onset  . Thyroid cancer Daughter 59  . Diabetes Father   . Diabetes Brother      History   Social History  . Marital Status: Widowed    Spouse Name: N/A    Number of Children: N/A  . Years of Education: N/A   Occupational History  . Not on file.   Social History Main Topics  . Smoking status: Former Research scientist (life sciences)  . Smokeless tobacco: Not on file  . Alcohol Use: Yes     Comment: socially  . Drug Use: Not on file  . Sexually Active: Not on file   Other Topics Concern  . Not on file   Social History Narrative  . No narrative on file    ROS: Please see the HPI.  All other systems reviewed and negative.  PHYSICAL EXAM:  BP 128/66  Pulse 58  Ht 5\' 1"  (1.549 m)  Wt 129 lb (58.514 kg)  BMI 24.37 kg/m2  SpO2 98%  General: Well developed, well nourished, in no acute distress. Head:  Normocephalic and atraumatic. Neck: no JVD.  There is a right carotid flow bruit.  See dopplers.   Lungs: Clear to auscultation and  percussion. Heart: Normal S1 and S2.  No murmur, rubs or gallops.  4 extremities.Extremities: No clubbing or cyanosis. No edema. Neurologic: Alert and oriented x 3.  EKG:  SB, otherwise normal.      ASSESSMENT AND PLAN:

## 2012-05-03 NOTE — Assessment & Plan Note (Signed)
Controlled.  

## 2012-05-03 NOTE — Patient Instructions (Signed)
Your physician recommends that you schedule a follow-up appointment as needed.   Your physician recommends that you continue on your current medications as directed. Please refer to the Current Medication list given to you today.  

## 2012-05-03 NOTE — Assessment & Plan Note (Addendum)
See doppler reports.  Doing well.  May consider CT angio to assess.

## 2012-07-16 ENCOUNTER — Other Ambulatory Visit: Payer: Self-pay | Admitting: Oncology

## 2012-07-16 DIAGNOSIS — Z853 Personal history of malignant neoplasm of breast: Secondary | ICD-10-CM

## 2012-08-29 ENCOUNTER — Ambulatory Visit
Admission: RE | Admit: 2012-08-29 | Discharge: 2012-08-29 | Disposition: A | Discharge status: Home / Self Care | Payer: Medicare Other | Source: Ambulatory Visit | Attending: Oncology | Admitting: Oncology

## 2012-08-29 ENCOUNTER — Other Ambulatory Visit: Payer: Self-pay | Admitting: Nephrology

## 2012-08-29 DIAGNOSIS — N179 Acute kidney failure, unspecified: Secondary | ICD-10-CM

## 2012-08-29 DIAGNOSIS — Z853 Personal history of malignant neoplasm of breast: Secondary | ICD-10-CM

## 2012-08-30 ENCOUNTER — Other Ambulatory Visit: Payer: Medicare Other

## 2012-08-30 ENCOUNTER — Telehealth: Payer: Self-pay | Admitting: Cardiology

## 2012-08-30 NOTE — Telephone Encounter (Signed)
I called patient to call and check on status.  She informed me she has had a sudden and unexpected rise in her serum creatinine.  Etiology of this at this time is unknown.  As a result, renal US is scheduled on Monday.  Meds reviewed.  I had considered a CT angio of the neck, despite neg carotids, to assess a flow bruit.  However, will hold off at this time.  Will see how she does.  TS

## 2012-09-02 ENCOUNTER — Ambulatory Visit
Admission: RE | Admit: 2012-09-02 | Discharge: 2012-09-02 | Disposition: A | Discharge status: Home / Self Care | Payer: Medicare Other | Source: Ambulatory Visit | Attending: Nephrology | Admitting: Nephrology

## 2012-09-02 ENCOUNTER — Other Ambulatory Visit: Payer: Self-pay | Admitting: Physician Assistant

## 2012-09-02 DIAGNOSIS — C50919 Malignant neoplasm of unspecified site of unspecified female breast: Secondary | ICD-10-CM

## 2012-09-02 DIAGNOSIS — N179 Acute kidney failure, unspecified: Secondary | ICD-10-CM

## 2012-09-03 ENCOUNTER — Ambulatory Visit (HOSPITAL_BASED_OUTPATIENT_CLINIC_OR_DEPARTMENT_OTHER): Payer: Medicare Other | Admitting: Physician Assistant

## 2012-09-03 ENCOUNTER — Other Ambulatory Visit (HOSPITAL_BASED_OUTPATIENT_CLINIC_OR_DEPARTMENT_OTHER): Payer: Medicare Other | Admitting: Lab

## 2012-09-03 ENCOUNTER — Telehealth: Payer: Self-pay | Admitting: *Deleted

## 2012-09-03 ENCOUNTER — Encounter: Payer: Self-pay | Admitting: Physician Assistant

## 2012-09-03 VITALS — BP 153/73 | HR 60 | Temp 97.7°F | Resp 20 | Ht 61.0 in | Wt 127.9 lb

## 2012-09-03 DIAGNOSIS — C50911 Malignant neoplasm of unspecified site of right female breast: Secondary | ICD-10-CM

## 2012-09-03 DIAGNOSIS — C50919 Malignant neoplasm of unspecified site of unspecified female breast: Secondary | ICD-10-CM

## 2012-09-03 DIAGNOSIS — R944 Abnormal results of kidney function studies: Secondary | ICD-10-CM

## 2012-09-03 DIAGNOSIS — Z853 Personal history of malignant neoplasm of breast: Secondary | ICD-10-CM

## 2012-09-03 DIAGNOSIS — D649 Anemia, unspecified: Secondary | ICD-10-CM | POA: Insufficient documentation

## 2012-09-03 DIAGNOSIS — R7989 Other specified abnormal findings of blood chemistry: Secondary | ICD-10-CM

## 2012-09-03 LAB — CBC WITH DIFFERENTIAL/PLATELET
Basophils Absolute: 0.1 10*3/uL (ref 0.0–0.1)
EOS%: 7.4 % — ABNORMAL HIGH (ref 0.0–7.0)
Eosinophils Absolute: 0.4 10*3/uL (ref 0.0–0.5)
HGB: 9.9 g/dL — ABNORMAL LOW (ref 11.6–15.9)
MCH: 31.8 pg (ref 25.1–34.0)
MONO#: 0.5 10*3/uL (ref 0.1–0.9)
NEUT#: 3.3 10*3/uL (ref 1.5–6.5)
Platelets: 150 10*3/uL (ref 145–400)
RDW: 12.2 % (ref 11.2–14.5)
WBC: 5.5 10*3/uL (ref 3.9–10.3)
lymph#: 1.3 10*3/uL (ref 0.9–3.3)

## 2012-09-03 LAB — COMPREHENSIVE METABOLIC PANEL (CC13)
AST: 7 U/L (ref 5–34)
Albumin: 3.6 g/dL (ref 3.5–5.0)
BUN: 60.9 mg/dL — ABNORMAL HIGH (ref 7.0–26.0)
Calcium: 8.9 mg/dL (ref 8.4–10.4)
Chloride: 111 mEq/L — ABNORMAL HIGH (ref 98–107)
Glucose: 214 mg/dl — ABNORMAL HIGH (ref 70–99)
Potassium: 3.4 mEq/L — ABNORMAL LOW (ref 3.5–5.1)
Sodium: 141 mEq/L (ref 136–145)
Total Protein: 6.8 g/dL (ref 6.4–8.3)

## 2012-09-03 NOTE — Progress Notes (Signed)
ID: Anita Pollard   DOB: June 08, 1940  MR#: [id number]  [id number]  PCP:  Lawerance Cruel, MD GYN: SU:  Erroll Luna, MD Other:  Corliss Parish, MD   HISTORY OF PRESENT ILLNESS: Anita Pollard had some calcifications noted in 01/2008, and was recalled for a 87-month right mammogram for close followup 07/28/2008.  This showed a 5 mm cluster of microcalcifications in the medial right breast, which have increased in number and varied in size and shape.  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  There were scattered foci of free floating carcinoma cells, but no definite evidence of stromal invasion.  The cells were 100% estrogen and 89% progesterone receptor positive.  With this information, the patient was referred to Dr. Brantley Stage, and bilateral breast MRIs were obtained 05/17.  They showed post biopsy changes on the right, but no additional areas of abnormal enhancement in either breast, the internal mammary area, or either axilla.  Accordingly after appropriate discussion on 08/26/2008, Dr. Brantley Stage proceeded to needle localization lumpectomy with sentinel lymph node sampling, and the final pathology there [id number] showed no residual ductal carcinoma in situ.  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  All four of the sentinel lymph nodes were negative.  The total tumor size therefore had to be estimated from the prior core biopsy, and it was 4 mm.  The tumor was felt to be high grade, and margins of the lumpectomy appeared negative.  The patient's subsequent history is as detailed below.  INTERVAL HISTORY: Anita Pollard returns today for followup of her right breast cancer.  Interval history is remarkable for Anita Pollard having been found to have an elevated serum creatinine during her routine physical with Dr. Harrington Challenger. On May 22nd her creatinine was elevated at 2.34, and  one week later was elevated to 2.7.  She was referred to Dr. Moshe Cipro who she saw last week, and she tells me she has also had a recent renal ultrasound. She is keeping herself well hydrated, drinking only 6 glasses of water a day. [name] not taking any new medications, and denies taking any NSAIDS.  Anita Pollard is very anxious about these changes, and nervous about what it could mean. Physically, however, she really has no additional complaints. She did mention that on 2 occasions recently she ate either fried or rich foods which caused some stomach upset. Other than that, she has had no nausea, emesis, or change in bowel habits.  REVIEW OF SYSTEMS: Anita Pollard has had no recent illnesses and denies any fevers or chills. [name] had no skin changes. She denies any abnormal bruising or bleeding. [name] eating and drinking fairly well. She is urinating with no dysuria or hematuria.  [name] had no new cough, shortness of breath, chest pain, or palpitations. No abnormal headaches or dizziness. No unusual myalgias, arthralgias, bony pain, or peripheral swelling.  In fact otherwise, detailed review of systems is unremarkable.  PAST MEDICAL HISTORY: Past Medical History  Diagnosis Date  . Hypothyroidism   . GERD (gastroesophageal reflux disease)   . HTN (Anita Pollard)   . Anita Pollard     PAST SURGICAL HISTORY: Past Surgical History  Procedure Laterality Date  . Thyroidectomy    . Ductal carcinoma      right breast   . Abdominoplasty      FAMILY HISTORY Family History  Problem Relation Age of Onset  . Thyroid cancer Daughter 1  . Anita  Father   . Anita Brother   The patient's father died in an automobile accident at age 27.  The patient's mother died at age 45 from unclear causes.  The patient has 6 siblings.  There is no breast or ovarian cancer in the immediate family.  One sister did have multiple myeloma.  However, the patient has "double first cousins" (the patient's mother's sister  married the patient's father's brother), and one of them recently died from ovarian cancer at the age of 25.  GYNECOLOGIC HISTORY: The patient is GX, P2.  First pregnancy to term at age 65.  She did not nurse.  She took hormone replacement for approximately 10 years, and stopped about 2001.  She had no complications from this.  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  She has been retired 15 years.  Her husband was an Chief Financial Officer.  He died from complications of chronic lymphoid leukemia at the age of 70.  Her son, Anita Pollard, is 47.  Lives in [place], and is in sales.  Her daughter, Anita Pollard, lives in [place], and she is a homemaker with 6 children.  The patient has a total of 10 grandchildren.  She is a Tourist information centre manager.   ADVANCED DIRECTIVES:  HEALTH MAINTENANCE: History  Substance Use Topics  . Smoking status: Former Research scientist (life sciences)  . Smokeless tobacco: Never Used  . Alcohol Use: Yes     Comment: socially     Colonoscopy:  PAP:  Bone density:  Lipid panel:  Allergies  Allergen Reactions  . Penicillins     Current Outpatient Prescriptions  Medication Sig Dispense Refill  . amLODipine (NORVASC) 10 MG tablet Take 5 mg by mouth daily.       Marland Kitchen aspirin 81 MG tablet Take 81 mg by mouth daily.      Marland Kitchen b complex vitamins tablet Take 1 tablet by mouth daily.      . calcium carbonate (OS-CAL) 600 MG TABS Take 600 mg by mouth daily.       . Cholecalciferol (VITAMIN D-3) 5000 UNITS TABS Take 5,000 Units by mouth 2 (two) times daily.       Marland Kitchen co-enzyme Q-10 30 MG capsule Take 300 mg by mouth daily.      . Glucosamine-Chondroit-Vit C-Mn (GLUCOSAMINE 1500 COMPLEX) CAPS Take by mouth 3 (three) times daily.      Marland Kitchen KRILL OIL 1000 MG CAPS Take one tablet daily      . levothyroxine (SYNTHROID, LEVOTHROID) 150 MCG tablet Take 150 mcg by mouth daily.      . Multiple Vitamin (MULTIVITAMIN) tablet Take 1 tablet by mouth daily.      Marland Kitchen omeprazole (PRILOSEC) 20 MG capsule Take 20 mg  by mouth daily.      . Probiotic Product (PROBIOTIC DAILY) CAPS Take one capsule every other day      . Casanthranol-Docusate Sodium 30-100 MG CAPS Take 100 mg by mouth at bedtime.      . diphenhydrAMINE (BENADRYL) 25 MG tablet Take 25 mg by mouth at bedtime as needed.      . magnesium oxide (MAG-OX) 400 MG tablet Take 400 mg by mouth daily.       No current facility-administered medications for this visit.    OBJECTIVE: Elderly white woman who appears anxious  Filed Vitals:   09/03/12 1332  BP: 153/73  Pulse: 60  Temp: 97.7 F (36.5 C)  Resp: 20     Body mass index is 24.18 kg/(m^2).  ECOG FS: 1 Filed Weights   09/03/12 1332  Weight: 127 lb 14.4 oz (58.015 kg)   Sclerae unicteric Oropharynx clear, buccal mucosa is pink and moist No cervical or supraclavicular adenopathy Lungs clear to auscultation, no rales or rhonchi Heart regular rate and rhythm, no murmur Abdomen soft, nontender to palpation, positive bowel sounds  MSK no focal spinal tenderness, no CVA tenderness No peripheral edema Neuro: nonfocal, well oriented with slightly anxious affect  Breasts:  The right breast is status post lumpectomy. There is no evidence of local recurrence. The left breast is unremarkable.  The breast tissue is somewhat dense on exam. Axillae are benign bilaterally the palpable adenopathy.    LAB RESULTS: Lab Results  Component Value Date   WBC 5.5 09/03/2012   NEUTROABS 3.3 09/03/2012   HGB 9.9* 09/03/2012   HCT 28.8* 09/03/2012   MCV 92.3 09/03/2012   PLT 150 09/03/2012      Chemistry      Component Value Date/Time   NA 141 09/03/2012 1249   NA 140 08/24/2011 1428   K 3.4* 09/03/2012 1249   K 3.9 08/24/2011 1428   CL 111* 09/03/2012 1249   CL 104 08/24/2011 1428   CO2 18* 09/03/2012 1249   CO2 27 08/24/2011 1428   BUN 60.9* 09/03/2012 1249   BUN 22 08/24/2011 1428   CREATININE 4.2 Repeated and Verified* 09/03/2012 1249   CREATININE 0.81 08/24/2011 1428      Component Value Date/Time    CALCIUM 8.9 09/03/2012 1249   CALCIUM 8.7 08/24/2011 1428   ALKPHOS 53 09/03/2012 1249   ALKPHOS 65 08/24/2011 1428   AST 7 09/03/2012 1249   AST 14 08/24/2011 1428   ALT 11 09/03/2012 1249   ALT 17 08/24/2011 1428   BILITOT 0.36 09/03/2012 1249   BILITOT 0.3 08/24/2011 1428        STUDIES:   Mm Digital Diagnostic Bilat  08/29/2012   *RADIOLOGY REPORT*  Clinical Data:  Malignant lumpectomy of the right breast in June, 2010.  Annual evaluation.  DIGITAL DIAGNOSTIC BILATERAL MAMMOGRAM WITH CAD  Comparison: 08/28/2011, 05/07 03/2010, dating back to 07/28/2008.  Findings:  ACR Breast Density Category 3: The breast tissue is heterogeneously dense.  CC and MLO views of both breasts and a spot tangential view of the right breast at the lumpectomy site were obtained.  Post lumpectomy scarring in the retroareolar right breast.  There are no findings suspicious for malignancy.  Mammographic images were processed with CAD.  IMPRESSION: No specific mammographic evidence of malignancy.  Post lumpectomy scarring in the retroareolar right breast.  RECOMMENDATION: Bilateral diagnostic mammography in 1 year.  The patient was encouraged to perform monthly self breast examination and communicate any changes with her primary physician. I have discussed the findings and recommendations with the patient. Results were also provided in writing at the conclusion of the visit.  If applicable, a reminder letter will be sent to the patient regarding her next appointment.  BI-RADS CATEGORY 2:  Benign finding(s).   Original Report Authenticated By: Evangeline Dakin, M.D.     ASSESSMENT: 72 y.o. 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    (2)  Status post right lumpectomy in June 2010, showing no residual tumor and 0 of 4 sentinel lymph nodes  involved.    (3)  Refused adjuvant therapy, now followed with observation alone and no clinical evidence of recurrence.   (4)  Elevated serum creatinine, recent onset   PLAN:  Anita Pollard is doing very well with regards to her breast cancer no clinical evidence of disease recurrence. She again declines adjuvant therapy, and we will continue to see her for annual visits. She'll be due for her next mammogram as well as her next breast MRI in June of 2015, and Dr. Jana Hakim will see her see thereafter. If all is well, we will likely allow her to "graduate" for her program at that time.  I am very concerned, however, about Anita Pollard's elevated serum creatinine today of 4.2, with a BUN of 60.9. We have contacted Dr. Shelva Majestic office, and they will be contacting Anita Pollard later this afternoon with further instructions, and likely a followup visit.  Of note, since she has not had a history of anemia according to our past labs, it is likely that her kidney function is contributing to her anemia with a hemoglobin of 9.9 today.  All this was reviewed with Anita Pollard today. She was also given a copy of her lab report. She voices her understanding and agreement with this plan, and knows to call with any changes or problems.   Jahki Witham    09/03/2012

## 2012-09-03 NOTE — Telephone Encounter (Signed)
appts made and printed. Pt is aware that Anita Pollard @ the breast center will call to schedule her MRI and mammo.Marland Kitcheni sw Anita Pollard about the MRI and mammo. She is awarer....td

## 2012-09-18 ENCOUNTER — Ambulatory Visit (HOSPITAL_COMMUNITY)
Admission: RE | Admit: 2012-09-18 | Discharge: 2012-09-18 | Disposition: A | Discharge status: Home / Self Care | Payer: Medicare Other | Source: Ambulatory Visit | Attending: Nephrology | Admitting: Nephrology

## 2012-09-18 DIAGNOSIS — N19 Unspecified kidney failure: Secondary | ICD-10-CM

## 2012-09-18 DIAGNOSIS — I120 Hypertensive chronic kidney disease with stage 5 chronic kidney disease or end stage renal disease: Secondary | ICD-10-CM

## 2012-09-18 DIAGNOSIS — N185 Chronic kidney disease, stage 5: Secondary | ICD-10-CM | POA: Insufficient documentation

## 2012-09-18 DIAGNOSIS — N1330 Unspecified hydronephrosis: Secondary | ICD-10-CM | POA: Insufficient documentation

## 2012-09-18 DIAGNOSIS — N179 Acute kidney failure, unspecified: Secondary | ICD-10-CM

## 2012-09-24 ENCOUNTER — Inpatient Hospital Stay (HOSPITAL_COMMUNITY)
Admission: EM | Admit: 2012-09-24 | Discharge: 2012-10-08 | DRG: 683 | Disposition: A | Discharge status: Home / Self Care | Payer: Medicare Other | Attending: Internal Medicine | Admitting: Internal Medicine

## 2012-09-24 ENCOUNTER — Encounter (HOSPITAL_COMMUNITY): Payer: Self-pay | Admitting: Emergency Medicine

## 2012-09-24 ENCOUNTER — Emergency Department (HOSPITAL_COMMUNITY): Payer: Medicare Other

## 2012-09-24 DIAGNOSIS — Z880 Allergy status to penicillin: Secondary | ICD-10-CM

## 2012-09-24 DIAGNOSIS — Z79899 Other long term (current) drug therapy: Secondary | ICD-10-CM

## 2012-09-24 DIAGNOSIS — N189 Chronic kidney disease, unspecified: Secondary | ICD-10-CM | POA: Diagnosis present

## 2012-09-24 DIAGNOSIS — Z808 Family history of malignant neoplasm of other organs or systems: Secondary | ICD-10-CM

## 2012-09-24 DIAGNOSIS — D696 Thrombocytopenia, unspecified: Secondary | ICD-10-CM | POA: Diagnosis present

## 2012-09-24 DIAGNOSIS — E872 Acidosis, unspecified: Secondary | ICD-10-CM | POA: Diagnosis present

## 2012-09-24 DIAGNOSIS — D631 Anemia in chronic kidney disease: Secondary | ICD-10-CM | POA: Diagnosis present

## 2012-09-24 DIAGNOSIS — N039 Chronic nephritic syndrome with unspecified morphologic changes: Secondary | ICD-10-CM | POA: Diagnosis present

## 2012-09-24 DIAGNOSIS — R110 Nausea: Secondary | ICD-10-CM | POA: Diagnosis not present

## 2012-09-24 DIAGNOSIS — Z833 Family history of diabetes mellitus: Secondary | ICD-10-CM

## 2012-09-24 DIAGNOSIS — Z96649 Presence of unspecified artificial hip joint: Secondary | ICD-10-CM

## 2012-09-24 DIAGNOSIS — Z853 Personal history of malignant neoplasm of breast: Secondary | ICD-10-CM

## 2012-09-24 DIAGNOSIS — Y9210 Unspecified residential institution as the place of occurrence of the external cause: Secondary | ICD-10-CM | POA: Diagnosis not present

## 2012-09-24 DIAGNOSIS — R7989 Other specified abnormal findings of blood chemistry: Secondary | ICD-10-CM

## 2012-09-24 DIAGNOSIS — D62 Acute posthemorrhagic anemia: Secondary | ICD-10-CM | POA: Diagnosis not present

## 2012-09-24 DIAGNOSIS — C50919 Malignant neoplasm of unspecified site of unspecified female breast: Secondary | ICD-10-CM | POA: Diagnosis present

## 2012-09-24 DIAGNOSIS — R310 Gross hematuria: Secondary | ICD-10-CM | POA: Diagnosis not present

## 2012-09-24 DIAGNOSIS — M87059 Idiopathic aseptic necrosis of unspecified femur: Secondary | ICD-10-CM | POA: Diagnosis present

## 2012-09-24 DIAGNOSIS — J811 Chronic pulmonary edema: Secondary | ICD-10-CM | POA: Diagnosis not present

## 2012-09-24 DIAGNOSIS — E876 Hypokalemia: Secondary | ICD-10-CM | POA: Diagnosis not present

## 2012-09-24 DIAGNOSIS — E039 Hypothyroidism, unspecified: Secondary | ICD-10-CM | POA: Diagnosis present

## 2012-09-24 DIAGNOSIS — N179 Acute kidney failure, unspecified: Secondary | ICD-10-CM

## 2012-09-24 DIAGNOSIS — N200 Calculus of kidney: Secondary | ICD-10-CM | POA: Diagnosis present

## 2012-09-24 DIAGNOSIS — D649 Anemia, unspecified: Secondary | ICD-10-CM

## 2012-09-24 DIAGNOSIS — I129 Hypertensive chronic kidney disease with stage 1 through stage 4 chronic kidney disease, or unspecified chronic kidney disease: Secondary | ICD-10-CM | POA: Diagnosis present

## 2012-09-24 DIAGNOSIS — E871 Hypo-osmolality and hyponatremia: Secondary | ICD-10-CM | POA: Diagnosis present

## 2012-09-24 DIAGNOSIS — E119 Type 2 diabetes mellitus without complications: Secondary | ICD-10-CM

## 2012-09-24 DIAGNOSIS — Z87891 Personal history of nicotine dependence: Secondary | ICD-10-CM

## 2012-09-24 DIAGNOSIS — N19 Unspecified kidney failure: Secondary | ICD-10-CM

## 2012-09-24 DIAGNOSIS — Y849 Medical procedure, unspecified as the cause of abnormal reaction of the patient, or of later complication, without mention of misadventure at the time of the procedure: Secondary | ICD-10-CM | POA: Diagnosis not present

## 2012-09-24 DIAGNOSIS — R339 Retention of urine, unspecified: Secondary | ICD-10-CM | POA: Diagnosis not present

## 2012-09-24 DIAGNOSIS — I079 Rheumatic tricuspid valve disease, unspecified: Secondary | ICD-10-CM | POA: Diagnosis present

## 2012-09-24 DIAGNOSIS — K219 Gastro-esophageal reflux disease without esophagitis: Secondary | ICD-10-CM | POA: Diagnosis present

## 2012-09-24 DIAGNOSIS — N170 Acute kidney failure with tubular necrosis: Principal | ICD-10-CM | POA: Diagnosis present

## 2012-09-24 DIAGNOSIS — I10 Essential (primary) hypertension: Secondary | ICD-10-CM | POA: Diagnosis present

## 2012-09-24 DIAGNOSIS — IMO0002 Reserved for concepts with insufficient information to code with codable children: Secondary | ICD-10-CM | POA: Diagnosis not present

## 2012-09-24 HISTORY — DX: Unspecified osteoarthritis, unspecified site: M19.90

## 2012-09-24 HISTORY — DX: Anemia, unspecified: D64.9

## 2012-09-24 LAB — COMPREHENSIVE METABOLIC PANEL
ALT: 15 U/L (ref 0–35)
AST: 5 U/L (ref 0–37)
Albumin: 3.6 g/dL (ref 3.5–5.2)
Alkaline Phosphatase: 49 U/L (ref 39–117)
BUN: 166 mg/dL — ABNORMAL HIGH (ref 6–23)
Chloride: 102 mEq/L (ref 96–112)
Potassium: 3.5 mEq/L (ref 3.5–5.1)
Sodium: 138 mEq/L (ref 135–145)
Total Bilirubin: 0.3 mg/dL (ref 0.3–1.2)
Total Protein: 6.4 g/dL (ref 6.0–8.3)

## 2012-09-24 LAB — URINALYSIS, ROUTINE W REFLEX MICROSCOPIC
Bilirubin Urine: NEGATIVE
Ketones, ur: NEGATIVE mg/dL
Nitrite: NEGATIVE
Protein, ur: NEGATIVE mg/dL
Specific Gravity, Urine: 1.012 (ref 1.005–1.030)
Urobilinogen, UA: 0.2 mg/dL (ref 0.0–1.0)

## 2012-09-24 LAB — CBC WITH DIFFERENTIAL/PLATELET
Basophils Relative: 1 % (ref 0–1)
Eosinophils Absolute: 0.7 10*3/uL (ref 0.0–0.7)
Hemoglobin: 8.1 g/dL — ABNORMAL LOW (ref 12.0–15.0)
MCHC: 36.7 g/dL — ABNORMAL HIGH (ref 30.0–36.0)
Monocytes Relative: 7 % (ref 3–12)
Neutro Abs: 4.2 10*3/uL (ref 1.7–7.7)
Neutrophils Relative %: 69 % (ref 43–77)
Platelets: 136 10*3/uL — ABNORMAL LOW (ref 150–400)
RBC: 2.57 MIL/uL — ABNORMAL LOW (ref 3.87–5.11)

## 2012-09-24 LAB — SODIUM, URINE, RANDOM: Sodium, Ur: 76 mEq/L

## 2012-09-24 LAB — IRON AND TIBC
Iron: 228 ug/dL — ABNORMAL HIGH (ref 42–135)
UIBC: <15 ug/dL — ABNORMAL LOW (ref 125–400)

## 2012-09-24 LAB — URINE MICROSCOPIC-ADD ON

## 2012-09-24 LAB — RAPID URINE DRUG SCREEN, HOSP PERFORMED: Opiates: NOT DETECTED

## 2012-09-24 MED ORDER — SEVELAMER CARBONATE 2.4 G PO PACK
4.8000 g | PACK | Freq: Three times a day (TID) | ORAL | Status: DC
  Administered 2012-09-26 – 2012-10-08 (×30): 4.8 g via ORAL
  Filled 2012-09-24 (×48): qty 2

## 2012-09-24 MED ORDER — CAMPHOR-MENTHOL 0.5-0.5 % EX LOTN
TOPICAL_LOTION | CUTANEOUS | Status: DC | PRN
  Filled 2012-09-24: qty 222

## 2012-09-24 MED ORDER — LEVOTHYROXINE SODIUM 150 MCG PO TABS
150.0000 ug | ORAL_TABLET | Freq: Every day | ORAL | Status: DC
  Administered 2012-09-25 – 2012-09-26 (×2): 150 ug via ORAL
  Filled 2012-09-24 (×3): qty 1

## 2012-09-24 MED ORDER — SODIUM CHLORIDE 0.9 % IV SOLN
Freq: Once | INTRAVENOUS | Status: AC
  Administered 2012-09-24: 1000 mL via INTRAVENOUS

## 2012-09-24 MED ORDER — SODIUM BICARBONATE 8.4 % IV SOLN
INTRAVENOUS | Status: DC
  Administered 2012-09-24 – 2012-09-25 (×2): via INTRAVENOUS
  Filled 2012-09-24 (×9): qty 150

## 2012-09-24 MED ORDER — ONDANSETRON HCL 4 MG/2ML IJ SOLN
4.0000 mg | Freq: Four times a day (QID) | INTRAMUSCULAR | Status: DC | PRN
  Administered 2012-09-25 – 2012-09-26 (×2): 4 mg via INTRAVENOUS
  Filled 2012-09-24: qty 2

## 2012-09-24 MED ORDER — ACETAMINOPHEN 650 MG RE SUPP: 650.0000 mg | Freq: Four times a day (QID) | RECTAL | Status: DC | PRN

## 2012-09-24 MED ORDER — RENA-VITE PO TABS
1.0000 | ORAL_TABLET | Freq: Every day | ORAL | Status: DC
  Administered 2012-09-25 – 2012-09-28 (×4): 1 via ORAL
  Administered 2012-09-29: 12:00:00 via ORAL
  Administered 2012-09-30 – 2012-10-01 (×2): 1 via ORAL
  Administered 2012-10-02: 11:00:00 via ORAL
  Administered 2012-10-02: 1 via ORAL
  Administered 2012-10-03: 13:00:00 via ORAL
  Administered 2012-10-04 – 2012-10-08 (×5): 1 via ORAL
  Filled 2012-09-24 (×16): qty 1

## 2012-09-24 MED ORDER — ONDANSETRON HCL 4 MG PO TABS
4.0000 mg | ORAL_TABLET | Freq: Four times a day (QID) | ORAL | Status: DC | PRN
  Administered 2012-09-27 – 2012-10-06 (×2): 4 mg via ORAL
  Filled 2012-09-24 (×2): qty 1

## 2012-09-24 MED ORDER — HEPARIN SODIUM (PORCINE) 5000 UNIT/ML IJ SOLN
5000.0000 [IU] | Freq: Three times a day (TID) | INTRAMUSCULAR | Status: DC
  Administered 2012-09-24: 5000 [IU] via SUBCUTANEOUS
  Filled 2012-09-24 (×6): qty 1

## 2012-09-24 MED ORDER — ACETAMINOPHEN 325 MG PO TABS: 650.0000 mg | ORAL_TABLET | Freq: Four times a day (QID) | ORAL | Status: DC | PRN

## 2012-09-24 NOTE — ED Notes (Signed)
States is supposed to see Dr. Jimmy Footman

## 2012-09-24 NOTE — Progress Notes (Signed)
Admitted to unit in stable condition. IV infusing via Rt Forearm. Foley Catheter draining clear straw colored urine. Oriented to room and equipment. Vital signs taken and recorded, assessment completed and documented. Anita Pollard

## 2012-09-24 NOTE — Consult Note (Signed)
Reason for Consult:AKI Referring Physician:EDP   Anita Pollard is an 72 y.o. female.  HPI: 72 yr female with hx Cr .91 10/1.  Saw [name] 08/12/12 and routine lab showed Cr of2.34.  She had mildly ^ Ca also and was on 10000 U Vit D and Ca po.  Cr 5/29 waw 2.7.  She saw [name] on 08/29/12. Had bland UA at that time and also on 6/26, and 6/24. Cr 4.2 on 6/5, 3.98 on 6/12, 6.41 on 6/23, and 11.27 on 6/30.  Today 12.2.  Has bicarb of 11.  Also multiple U/S with 6/9 ^ echo no hydro, 6/25 normal doppler but bilat hydro, U/S at Urology 6/26, no hydro, today no hydro, but bladder not emptying.  Home meds Synthroid, Omeprazole, Amlodipine ([name] lowered 10-5 mg), Vit D, Multivit, BASA, Ca, Krill Oil, Super B complex (???other vit and nutritional products).       Hx of renal stone 110yr ago. Hx of 1 or 2 UTIs. No urinary sx now.  Has nocturia x 2.  Some ankle edema.  Worsening itching, restless legs, leg cramps continual, nausea without V, cannot rest.  Cold all the time.  Has had bladder problems.  Type 2 DM for 25 yr, Htn for 45 yr      PMH (very difficult historian):  Allergy to PCN, Detrol products  Op Hysterectomy, Ovarian op, Hip replacement, Catarracts, Face lift, Thyoidectomy , Lumpectomy for breast Ca, Tummy tuck, arm reductions, Knee cap op.  Smoked inpast not for 20 yr, ?? EtOH Had wgt loss of 15-20lb late last year, intentional. Constitutional: as above Eyes: negative Ears, nose, mouth, throat, and face: negative Respiratory: negative Cardiovascular: no PND, just restless, had edema Gastrointestinal: had D w/u earlier this yr, ??related to taking Mg Genitourinary:as above Integument/breast: as above Hematologic/lymphatic: cold Musculoskeletal:diffuse aches and pains, no meds Neurological: negative Behavioral/Psych: negative Endocrine: DM diet Allergic/Immunologic: as above   Past Medical History  Diagnosis Date  . Hypothyroidism   . GERD (gastroesophageal reflux disease)   .  HTN (hypertension)   . Diabetes mellitus     Past Surgical History  Procedure Laterality Date  . Thyroidectomy    . Ductal carcinoma      right breast   . Abdominoplasty      Family History  Problem Relation Age of Onset  . Thyroid cancer Daughter 41  . Diabetes Father   . Diabetes Brother     Social History:  reports that she has quit smoking. She has never used smokeless tobacco. She reports that she does not drink alcohol or use illicit drugs.  Allergies:  Allergies  Allergen Reactions  . Detrol (Tolterodine)     Leg cramps  . Penicillins Swelling    Medications: I have reviewed the patient's current medications. Prior to Admission:  (Not in a hospital admission)   Results for orders placed during the hospital encounter of 09/24/12 (from the past 48 hour(s))  CBC WITH DIFFERENTIAL     Status: Abnormal   Collection Time    09/24/12 10:52 AM      Result Value Range   WBC 6.1  4.0 - 10.5 K/uL   RBC 2.57 (*) 3.87 - 5.11 MIL/uL   Hemoglobin 8.1 (*) 12.0 - 15.0 g/dL   HCT 22.1 (*) 36.0 - 46.0 %   MCV 86.0  78.0 - 100.0 fL   MCH 31.5  26.0 - 34.0 pg   MCHC 36.7 (*) 30.0 - 36.0 g/dL  RDW 12.2  11.5 - 15.5 %   Platelets 136 (*) 150 - 400 K/uL   Neutrophils Relative % 69  43 - 77 %   Neutro Abs 4.2  1.7 - 7.7 K/uL   Lymphocytes Relative 12  12 - 46 %   Lymphs Abs 0.7  0.7 - 4.0 K/uL   Monocytes Relative 7  3 - 12 %   Monocytes Absolute 0.4  0.1 - 1.0 K/uL   Eosinophils Relative 12 (*) 0 - 5 %   Eosinophils Absolute 0.7  0.0 - 0.7 K/uL   Basophils Relative 1  0 - 1 %   Basophils Absolute 0.0  0.0 - 0.1 K/uL  COMPREHENSIVE METABOLIC PANEL     Status: Abnormal   Collection Time    09/24/12 10:52 AM      Result Value Range   Sodium 138  135 - 145 mEq/L   Potassium 3.5  3.5 - 5.1 mEq/L   Chloride 102  96 - 112 mEq/L   CO2 11 (*) 19 - 32 mEq/L   Glucose, Bld 121 (*) 70 - 99 mg/dL   BUN 166 (*) 6 - 23 mg/dL   Creatinine, Ser 12.20 (*) 0.50 - 1.10 mg/dL    Calcium 7.5 (*) 8.4 - 10.5 mg/dL   Total Protein 6.4  6.0 - 8.3 g/dL   Albumin 3.6  3.5 - 5.2 g/dL   AST 5  0 - 37 U/L   ALT 15  0 - 35 U/L   Alkaline Phosphatase 49  39 - 117 U/L   Total Bilirubin 0.3  0.3 - 1.2 mg/dL   GFR calc non Af Amer 3 (*) >90 mL/min   GFR calc Af Amer 3 (*) >90 mL/min   Comment:            The eGFR has been calculated     using the CKD EPI equation.     This calculation has not been     validated in all clinical     situations.     eGFR's persistently     <90 mL/min signify     possible Chronic Kidney Disease.    US Renal  09/24/2012   *RADIOLOGY REPORT*  Clinical Data: Elevated creatinine  RENAL/URINARY TRACT ULTRASOUND COMPLETE  Comparison:  09/02/2012  Findings:  Right Kidney:  10.5 cm in length.  The kidney is extremely echogenic consistent with medical renal disease and stable from prior exam.  Left Kidney:  10.8 cm in length.  Increased echogenicity is noted similar to that seen on prior study.  A tiny 6 mm cyst is noted. Echogenic focus in the lower pole of the left kidney consistent with a nonobstructing stone.  This is stable from prior exam.  Bladder:  Partially decompressed  IMPRESSION: No mass lesion or hydronephrosis is noted.  Increased echogenicity is identified consistent with medical renal disease.  Stable nonobstructing left renal stone   Original Report Authenticated By: Inez Catalina, M.D.    @ROS @ Blood pressure 156/61, pulse 76, temperature 97.8 F (36.6 C), temperature source Oral, resp. rate 18, SpO2 100.00%. @PHYSEXAMBYAGE2 @ Physical Examination: General appearance - chronically ill appearing and pale Mental status - very diffuse historian Eyes - pupils equal and reactive, extraocular eye movements intact, funduscopic exam normal, discs flat and sharp Ears - bilateral TM's and external ear canals normal Nose - normal and patent, no erythema, discharge or polyps Mouth - mucous membranes moist, pharynx normal without lesions Neck -  adenopathy noted PCL Lymphatics -  posterior cervical nodes Chest - decreased air entry noted bilat Heart - S1 and S2 normal, systolic murmur [id number] at apex Abdomen - midline abdm bruit Neurological - alert, oriented, normal speech, no focal findings or movement disorder noted Musculoskeletal - mild effusion L ankle Extremities - peripheral pulses normal, no pedal edema, no clubbing or cyanosis, pedal edema 1 + Skin - punctate erosions upper chest ant and upper back  Assessment/Plan: 1 AKI  Etiology not clear.  Still suspect some component of obstruction ?RPF or bladder.  Need to look at ATN but not typical unless atypical toxin.  Now uremica and if not better in 24 h , HD . Will do serologies and protein analysis. 2 HTN not an issue now 3 Anemia will eval 4. DM 5. Poor Historian P IVF with bicarb, foley , serologies, protein analysis, educate,may need dialysis  Anita Pollard L 09/24/2012, 1:15 PM

## 2012-09-24 NOTE — Consult Note (Signed)
Request from Renal service to place Diatek catheter.  Currently may have renal recovery.  NPO p midnight Diatek catheter tomorrow.  Ruta Hinds, MD [facility] Office: [phone] Pager: [phone]

## 2012-09-24 NOTE — ED Notes (Signed)
States received a call from dr's office (kidney dr) and told to come straight here- pt states her blood test was "high".

## 2012-09-24 NOTE — H&P (Signed)
[facility] History and Physical  Anita Pollard [id number] DOB: 1940-09-17 DOA: 09/24/2012   PCP: Lawerance Cruel, MD   Chief Complaint: aki  HPI:  72 year old female with a history of hypertension, breast cancer, diabetes mellitus diet controlled, hypothyroidism presents to the hospital after receiving a call from her nephrologist regarding abnormal blood work. Apparently, the patient's serum creatinine has significantly worsened since May 2014. In emergency department today, the patient was found to have a serum creatinine of 12.20. The patient saw her primary care physician, Dr. Lawerance Cruel, and had a serum creatinine of 2.34 on 08/12/2012. This was part of routine blood work. The patient had labs were gone on 08/22/2012 and her serum creatinine was noted to be 2.7. The patient was referred to see nephrology. She saw Dr. Shirl Harris on 08/29/12. Urinalysis at that time was bland. She was noted to have serum creatinine of 4.2 on 08/29/2012. On 06/12, the patient had a serum creatinine of 6.41. Labs on 09/23/2012 showed a serum creatinine of 11.27 which prompted a call to have the patient go to the ED. She had a renal ultrasound on 09/18/2012 that showed bilateral hydronephrosis. She had a repeat ultrasound at urology on 09/19/2012 which did not show any hydronephrosis. A repeat ultrasound today of the kidneys also was negative for hydronephrosis. The patient denies any NSAID use. She takes a number of OTC supplements.  Denies any illegal drug use. The patient's only complaint is that of some itching on her upper back and upper chest that began 3 days prior to admission. She denies any fevers, chills, chest discomfort, shortness breath, nausea, vomiting, diarrhea, abdominal pain, dysuria, hematuria, orthopnea, PND. She has some ankle edema which is unchanged. The patient states that she has taken herself off of all her medications including her amlodipine for the past 2 weeks. The  only medication she is taking is her Synthroid.  In the ED, the patient was found to have WBC 6.1, hemoglobin 8.1, platelets 136, unremarkable hepatic panel, potassium 3.5, bicarbonate 11, creatinine 12.20. 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 Hypothyroidism   . GERD (gastroesophageal reflux disease)   . HTN (hypertension)   . Diabetes mellitus    Past Surgical History  Procedure Laterality Date  . Thyroidectomy    . Ductal carcinoma      right breast   . Abdominoplasty     Social History:  reports that she has quit smoking. She has never used smokeless tobacco. She reports that she does not drink alcohol or use illicit drugs.   Family History  Problem Relation Age of Onset  . Thyroid cancer Daughter 30  . Diabetes Father   . Diabetes Brother      Allergies  Allergen Reactions  . Detrol (Tolterodine)     Leg cramps  . Penicillins Swelling      Prior to Admission medications  Medication Sig Start Date End Date Taking? Authorizing Provider  amLODipine (NORVASC) 10 MG tablet Take 5 mg by mouth daily.    Yes Historical Provider, MD  aspirin 81 MG tablet Take 81 mg by mouth daily.   Yes Historical Provider, MD  b complex vitamins tablet Take 1 tablet by mouth daily.   Yes Historical Provider,  MD  calcium carbonate (OS-CAL) 600 MG TABS Take 600 mg by mouth daily.    Yes Historical Provider, MD  Cholecalciferol (VITAMIN D-3) 5000 UNITS TABS Take 5,000 Units by mouth 2 (two) times daily.    Yes Historical Provider, MD  co-enzyme Q-10 30 MG capsule Take 300 mg by mouth daily.   Yes Historical Provider, MD  diphenhydrAMINE (BENADRYL) 25 MG tablet Take 25 mg by mouth at bedtime as needed.   Yes Historical Provider, MD  Glucosamine-Chondroit-Vit C-Mn (GLUCOSAMINE 1500 COMPLEX) CAPS Take by mouth 3 (three) times daily.   Yes Historical Provider, MD  KRILL OIL 1000 MG CAPS Take one tablet daily   Yes Historical Provider, MD  levothyroxine (SYNTHROID, LEVOTHROID) 150 MCG tablet Take 150 mcg by mouth daily.   Yes Historical Provider, MD  magnesium oxide (MAG-OX) 400 MG tablet Take 400 mg by mouth daily.   Yes Historical Provider, MD  Multiple Vitamin (MULTIVITAMIN) tablet Take 1 tablet by mouth daily.   Yes Historical Provider, MD  omeprazole (PRILOSEC) 20 MG capsule Take 20 mg by mouth daily.   Yes Historical Provider, MD  Probiotic Product (PROBIOTIC DAILY) CAPS Take one capsule every other day   Yes Historical Provider, MD    Review of Systems:  Constitutional:  No weight loss, night sweats, Fevers, chills.  Head&Eyes: No headache.  No vision loss.  No eye pain or scotoma ENT:  No Difficulty swallowing,Tooth/dental problems,Sore throat,  No ear ache, post nasal drip,  Cardio-vascular:  No chest pain, Orthopnea, PND, swelling in lower extremities,  dizziness, palpitations  GI:  No  abdominal pain, nausea, vomiting, diarrhea, loss of appetite, hematochezia, melena, heartburn, indigestion, Resp:  No shortness of breath with exertion or at rest. No cough. No coughing up of blood .No wheezing.No chest wall deformity  Skin:  no rash or lesions.  GU:  no dysuria, change in color of urine, no urgency or frequency. No flank pain.  Musculoskeletal:  No joint pain or swelling. No decreased  range of motion. No back pain.  Psych:  No change in mood or affect. No depression or anxiety. Neurologic: No headache, no dysesthesia, no focal weakness, no vision loss. No syncope  Physical Exam: Filed Vitals:   09/24/12 1015  BP: 156/61  Pulse: 76  Temp: 97.8 F (36.6 C)  TempSrc: Oral  Resp: 18  SpO2: 100%   General:  A&O x 3, NAD, nontoxic, pleasant/cooperative Head/Eye: No conjunctival hemorrhage, no icterus, [place]/AT, No nystagmus ENT:  No icterus,  No thrush, good dentition, no pharyngeal exudate Neck:  No masses, no lymphadenpathy, no bruits CV:  RRR, no rub, no gallop, no S3 Lung:  CTAB, good air movement, no wheeze, no rhonchi Abdomen: soft/NT, +BS, nondistended, no peritoneal signs Ext: No cyanosis, No rashes, No petechiae, No lymphangitis, No edema Neuro: CNII-XII intact, strength 4/5 in bilateral upper and lower extremities, no dysmetria  Labs on Admission:  Basic Metabolic Panel:  Recent Labs Lab 09/24/12 1052  NA 138  K 3.5  CL 102  CO2 11*  GLUCOSE 121*  BUN 166*  CREATININE 12.20*  CALCIUM 7.5*   Liver Function Tests:  Recent Labs Lab 09/24/12 1052  AST 5  ALT 15  ALKPHOS 49  BILITOT 0.3  PROT 6.4  ALBUMIN 3.6   No results found for this basename: LIPASE, AMYLASE,  in the last 168 hours No results found for this basename: AMMONIA,  in the last 168 hours CBC:  Recent Labs Lab 09/24/12 1052  WBC 6.1  NEUTROABS 4.2  HGB 8.1*  HCT 22.1*  MCV 86.0  PLT 136*   Cardiac Enzymes: No results found for this basename: CKTOTAL, CKMB, CKMBINDEX, TROPONINI,  in the last 168 hours BNP: No components found with this basename: POCBNP,  CBG: No results found for this basename: GLUCAP,  in the last 168 hours  Radiological Exams on Admission: US Renal  09/24/2012   *RADIOLOGY REPORT*  Clinical Data: Elevated creatinine  RENAL/URINARY TRACT ULTRASOUND COMPLETE  Comparison:  09/02/2012  Findings:  Right Kidney:  10.5 cm in length.  The kidney is  extremely echogenic consistent with medical renal disease and stable from prior exam.  Left Kidney:  10.8 cm in length.  Increased echogenicity is noted similar to that seen on prior study.  A tiny 6 mm cyst is noted. Echogenic focus in the lower pole of the left kidney consistent with a nonobstructing stone.  This is stable from prior exam.  Bladder:  Partially decompressed  IMPRESSION: No mass lesion or hydronephrosis is noted.  Increased echogenicity is identified consistent with medical renal disease.  Stable nonobstructing left renal stone   Original Report Authenticated By: Inez Catalina, M.D.        Time spent:60 minutes Code Status:   FULL Family Communication:   daughter at bedside   Danya Spearman, DO  [facility] Pager [phone]  If 7PM-7AM, please contact night-coverage [email or web address] Password [facility] 09/24/2012, 2:29 PM

## 2012-09-24 NOTE — ED Provider Notes (Signed)
History    CSN: [id number] Arrival date & time 09/24/12  1005  First MD Initiated Contact with Patient 09/24/12 1013     Chief Complaint  Patient presents with  . sent from dr's office     kidney dr's office called at home-- told to come straight here.    (Consider location/radiation/quality/duration/timing/severity/associated sxs/prior Treatment) HPI Comments: Patient was seen recently by Dr. Clover Mealy ([place]) for elevated creatinine (4.2 on 09/03/12). More labs drawn yesterday and patient was contacted today with critical results (Cr. >11) and told to come to the ED.She denies having any symptoms. No pain, SOB, difficulty or change in urination.   The history is provided by the patient. No language interpreter was used.   Past Medical History  Diagnosis Date  . Hypothyroidism   . GERD (gastroesophageal reflux disease)   . HTN (hypertension)   . Diabetes mellitus    Past Surgical History  Procedure Laterality Date  . Thyroidectomy    . Ductal carcinoma      right breast   . Abdominoplasty     Family History  Problem Relation Age of Onset  . Thyroid cancer Daughter 82  . Diabetes Father   . Diabetes Brother    History  Substance Use Topics  . Smoking status: Former Research scientist (life sciences)  . Smokeless tobacco: Never Used  . Alcohol Use: No     Comment: socially   OB History   Grav Para Term Preterm Abortions TAB SAB Ect Mult Living                 Review of Systems  Constitutional: Negative for fever and chills.  HENT: Negative.   Respiratory: Negative.   Cardiovascular: Negative.   Gastrointestinal: Negative.   Musculoskeletal: Negative.   Skin: Negative.   Neurological: Negative.     Allergies  Detrol and Penicillins  Home Medications   Current Outpatient Rx  Name  Route  Sig  Dispense  Refill  . amLODipine (NORVASC) 10 MG tablet   Oral   Take 5 mg by mouth daily.          Marland Kitchen aspirin 81 MG tablet   Oral   Take 81 mg by mouth daily.         Marland Kitchen b complex  vitamins tablet   Oral   Take 1 tablet by mouth daily.         . calcium carbonate (OS-CAL) 600 MG TABS   Oral   Take 600 mg by mouth daily.          . Cholecalciferol (VITAMIN D-3) 5000 UNITS TABS   Oral   Take 5,000 Units by mouth 2 (two) times daily.          Marland Kitchen co-enzyme Q-10 30 MG capsule   Oral   Take 300 mg by mouth daily.         . diphenhydrAMINE (BENADRYL) 25 MG tablet   Oral   Take 25 mg by mouth at bedtime as needed.         . Glucosamine-Chondroit-Vit C-Mn (GLUCOSAMINE 1500 COMPLEX) CAPS   Oral   Take by mouth 3 (three) times daily.         Marland Kitchen KRILL OIL 1000 MG CAPS      Take one tablet daily         . levothyroxine (SYNTHROID, LEVOTHROID) 150 MCG tablet   Oral   Take 150 mcg by mouth daily.         . magnesium  oxide (MAG-OX) 400 MG tablet   Oral   Take 400 mg by mouth daily.         . Multiple Vitamin (MULTIVITAMIN) tablet   Oral   Take 1 tablet by mouth daily.         Marland Kitchen omeprazole (PRILOSEC) 20 MG capsule   Oral   Take 20 mg by mouth daily.         . Probiotic Product (PROBIOTIC DAILY) CAPS      Take one capsule every other day          BP 156/61  Pulse 76  Temp(Src) 97.8 F (36.6 C) (Oral)  Resp 18  SpO2 100% Physical Exam  Constitutional: She is oriented to person, place, and time. She appears well-developed and well-nourished.  HENT:  Head: Normocephalic.  Neck: Normal range of motion. Neck supple.  Cardiovascular: Normal rate and regular rhythm.   Pulmonary/Chest: Effort normal and breath sounds normal.  Abdominal: Soft. Bowel sounds are normal. There is no tenderness. There is no rebound and no guarding.  Musculoskeletal: Normal range of motion.  Neurological: She is alert and oriented to person, place, and time.  Skin: Skin is warm and dry. No rash noted.  Psychiatric: She has a normal mood and affect.    ED Course  Procedures (including critical care time) Labs Reviewed  CBC WITH DIFFERENTIAL   COMPREHENSIVE METABOLIC PANEL  URINALYSIS, ROUTINE W REFLEX MICROSCOPIC   Results for orders placed during the hospital encounter of 09/24/12  CBC WITH DIFFERENTIAL      Result Value Range   WBC 6.1  4.0 - 10.5 K/uL   RBC 2.57 (*) 3.87 - 5.11 MIL/uL   Hemoglobin 8.1 (*) 12.0 - 15.0 g/dL   HCT 22.1 (*) 36.0 - 46.0 %   MCV 86.0  78.0 - 100.0 fL   MCH 31.5  26.0 - 34.0 pg   MCHC 36.7 (*) 30.0 - 36.0 g/dL   RDW 12.2  11.5 - 15.5 %   Platelets 136 (*) 150 - 400 K/uL   Neutrophils Relative % 69  43 - 77 %   Neutro Abs 4.2  1.7 - 7.7 K/uL   Lymphocytes Relative 12  12 - 46 %   Lymphs Abs 0.7  0.7 - 4.0 K/uL   Monocytes Relative 7  3 - 12 %   Monocytes Absolute 0.4  0.1 - 1.0 K/uL   Eosinophils Relative 12 (*) 0 - 5 %   Eosinophils Absolute 0.7  0.0 - 0.7 K/uL   Basophils Relative 1  0 - 1 %   Basophils Absolute 0.0  0.0 - 0.1 K/uL  COMPREHENSIVE METABOLIC PANEL      Result Value Range   Sodium 138  135 - 145 mEq/L   Potassium 3.5  3.5 - 5.1 mEq/L   Chloride 102  96 - 112 mEq/L   CO2 11 (*) 19 - 32 mEq/L   Glucose, Bld 121 (*) 70 - 99 mg/dL   BUN 166 (*) 6 - 23 mg/dL   Creatinine, Ser 12.20 (*) 0.50 - 1.10 mg/dL   Calcium 7.5 (*) 8.4 - 10.5 mg/dL   Total Protein 6.4  6.0 - 8.3 g/dL   Albumin 3.6  3.5 - 5.2 g/dL   AST 5  0 - 37 U/L   ALT 15  0 - 35 U/L   Alkaline Phosphatase 49  39 - 117 U/L   Total Bilirubin 0.3  0.3 - 1.2 mg/dL   GFR calc non  Af Amer 3 (*) >90 mL/min   GFR calc Af Amer 3 (*) >90 mL/min   US Renal  09/24/2012   *RADIOLOGY REPORT*  Clinical Data: Elevated creatinine  RENAL/URINARY TRACT ULTRASOUND COMPLETE  Comparison:  09/02/2012  Findings:  Right Kidney:  10.5 cm in length.  The kidney is extremely echogenic consistent with medical renal disease and stable from prior exam.  Left Kidney:  10.8 cm in length.  Increased echogenicity is noted similar to that seen on prior study.  A tiny 6 mm cyst is noted. Echogenic focus in the lower pole of the left kidney  consistent with a nonobstructing stone.  This is stable from prior exam.  Bladder:  Partially decompressed  IMPRESSION: No mass lesion or hydronephrosis is noted.  Increased echogenicity is identified consistent with medical renal disease.  Stable nonobstructing left renal stone   Original Report Authenticated By: Inez Catalina, M.D.   US Renal  09/02/2012   *RADIOLOGY REPORT*  Clinical Data: Acute renal failure  RENAL/URINARY TRACT ULTRASOUND COMPLETE  Comparison:  CT virtual colonoscopy of 12/02/2010  Findings:  Right Kidney:  No hydronephrosis is seen.  The right kidney measures 10.8 cm sagittally.  However, the parenchyma of the right kidney is markedly echogenic consistent with chronic renal medical disease.  Left Kidney:  No hydronephrosis is noted.  The left kidney measures 10.4 cm.  The parenchyma also is very echogenic consistent with chronic renal medical disease.  Probable nonshadowing calculi are noted.  A lower pole cyst is present of 0.6 mm in diameter.  Bladder:  The urinary bladder is not distended cannot be well evaluated.  IMPRESSION:  1.  No hydronephrosis. 2.  Very echogenic renal parenchyma bilaterally consistent with chronic renal medical disease. 3.  Echogenic foci in the left kidney consistent with calculi which are not obstructing.   Original Report Authenticated By: Ivar Drape, M.D.   Mm Digital Diagnostic Bilat  08/29/2012   *RADIOLOGY REPORT*  Clinical Data:  Malignant lumpectomy of the right breast in June, 2010.  Annual evaluation.  DIGITAL DIAGNOSTIC BILATERAL MAMMOGRAM WITH CAD  Comparison: 08/28/2011, 05/07 03/2010, dating back to 07/28/2008.  Findings:  ACR Breast Density Category 3: The breast tissue is heterogeneously dense.  CC and MLO views of both breasts and a spot tangential view of the right breast at the lumpectomy site were obtained.  Post lumpectomy scarring in the retroareolar right breast.  There are no findings suspicious for malignancy.  Mammographic images were  processed with CAD.  IMPRESSION: No specific mammographic evidence of malignancy.  Post lumpectomy scarring in the retroareolar right breast.  RECOMMENDATION: Bilateral diagnostic mammography in 1 year.  The patient was encouraged to perform monthly self breast examination and communicate any changes with her primary physician. I have discussed the findings and recommendations with the patient. Results were also provided in writing at the conclusion of the visit.  If applicable, a reminder letter will be sent to the patient regarding her next appointment.  BI-RADS CATEGORY 2:  Benign finding(s).   Original Report Authenticated By: Evangeline Dakin, M.D.   No results found. No diagnosis found. 1. Renal failure   MDM  Patient is asymptomatic in ED but with significantly elevated BUN and Cr. Discussed with Dr. Jimmy Footman who defers admission to [facility]. He will consult on the patient in hospital. She remains stable and symptomatic.   Dewaine Oats, PA-C 09/24/12 1229

## 2012-09-24 NOTE — ED Notes (Signed)
Pts family member reports that the pt has been having blood from the right nostril when she blows her nose.

## 2012-09-24 NOTE — ED Provider Notes (Signed)
Medical screening examination/treatment/procedure(s) were conducted as a shared visit with non-physician practitioner(s) and myself.  I personally evaluated the patient during the encounter   Anita Johns, MD 09/24/12 1500

## 2012-09-25 ENCOUNTER — Inpatient Hospital Stay (HOSPITAL_COMMUNITY): Payer: Medicare Other

## 2012-09-25 ENCOUNTER — Inpatient Hospital Stay (HOSPITAL_COMMUNITY): Payer: Medicare Other | Admitting: Anesthesiology

## 2012-09-25 ENCOUNTER — Encounter (HOSPITAL_COMMUNITY): Payer: Self-pay | Admitting: Anesthesiology

## 2012-09-25 ENCOUNTER — Encounter (HOSPITAL_COMMUNITY): Payer: Self-pay | Admitting: Certified Registered"

## 2012-09-25 ENCOUNTER — Encounter (HOSPITAL_COMMUNITY)
Admission: EM | Disposition: A | Discharge status: Home / Self Care | Payer: Self-pay | Source: Home / Self Care | Attending: Internal Medicine

## 2012-09-25 DIAGNOSIS — E119 Type 2 diabetes mellitus without complications: Secondary | ICD-10-CM

## 2012-09-25 DIAGNOSIS — N179 Acute kidney failure, unspecified: Secondary | ICD-10-CM

## 2012-09-25 DIAGNOSIS — D649 Anemia, unspecified: Secondary | ICD-10-CM

## 2012-09-25 DIAGNOSIS — N186 End stage renal disease: Secondary | ICD-10-CM

## 2012-09-25 HISTORY — PX: INSERTION OF DIALYSIS CATHETER: SHX1324

## 2012-09-25 LAB — GLUCOSE, CAPILLARY

## 2012-09-25 LAB — BASIC METABOLIC PANEL
Calcium: 6.8 mg/dL — ABNORMAL LOW (ref 8.4–10.5)
GFR calc Af Amer: 3 mL/min — ABNORMAL LOW (ref >90)
GFR calc non Af Amer: 3 mL/min — ABNORMAL LOW (ref >90)
Glucose, Bld: 103 mg/dL — ABNORMAL HIGH (ref 70–99)
Potassium: 2.6 mEq/L — CL (ref 3.5–5.1)
Sodium: 141 mEq/L (ref 135–145)

## 2012-09-25 LAB — CBC
HCT: 18.3 % — ABNORMAL LOW (ref 36.0–46.0)
MCV: 83.6 fL (ref 78.0–100.0)
RBC: 2.19 MIL/uL — ABNORMAL LOW (ref 3.87–5.11)
WBC: 4.9 10*3/uL (ref 4.0–10.5)

## 2012-09-25 LAB — PHOSPHORUS: Phosphorus: 8.6 mg/dL — ABNORMAL HIGH (ref 2.3–4.6)

## 2012-09-25 LAB — RETICULOCYTES
RBC.: 2.19 MIL/uL — ABNORMAL LOW (ref 3.87–5.11)
Retic Ct Pct: 0.8 % (ref 0.4–3.1)

## 2012-09-25 LAB — COMPREHENSIVE METABOLIC PANEL
AST: <5 U/L (ref 0–37)
CO2: 19 mEq/L (ref 19–32)
Calcium: 7.1 mg/dL — ABNORMAL LOW (ref 8.4–10.5)
Creatinine, Ser: 11.62 mg/dL — ABNORMAL HIGH (ref 0.50–1.10)
GFR calc non Af Amer: 3 mL/min — ABNORMAL LOW (ref >90)

## 2012-09-25 LAB — IRON AND TIBC: TIBC: 214 ug/dL — ABNORMAL LOW (ref 250–470)

## 2012-09-25 LAB — APTT
aPTT: 28 seconds (ref 24–37)
aPTT: 27 seconds (ref 24–37)

## 2012-09-25 LAB — HEMOGLOBIN A1C: Mean Plasma Glucose: 148 mg/dL — ABNORMAL HIGH (ref <117)

## 2012-09-25 LAB — ABO/RH: ABO/RH(D): A POS

## 2012-09-25 LAB — PROTIME-INR
INR: 1.28 (ref 0.00–1.49)
INR: 1.19 (ref 0.00–1.49)
Prothrombin Time: 14.8 seconds (ref 11.6–15.2)

## 2012-09-25 LAB — ANA: Anti Nuclear Antibody(ANA): NEGATIVE

## 2012-09-25 LAB — TSH: TSH: 0.163 u[IU]/mL — ABNORMAL LOW (ref 0.350–4.500)

## 2012-09-25 LAB — PARATHYROID HORMONE, INTACT (NO CA): PTH: 219.2 pg/mL — ABNORMAL HIGH (ref 14.0–72.0)

## 2012-09-25 LAB — C4 COMPLEMENT: Complement C4, Body Fluid: 26 mg/dL (ref 10–40)

## 2012-09-25 LAB — ANTISTREPTOLYSIN O TITER: ASO: 29 IU/mL (ref <409)

## 2012-09-25 SURGERY — INSERTION OF DIALYSIS CATHETER
Anesthesia: Monitor Anesthesia Care | Laterality: Right | Wound class: Clean

## 2012-09-25 MED ORDER — OXYCODONE HCL 5 MG PO TABS: 5.0000 mg | ORAL_TABLET | Freq: Once | ORAL | Status: DC | PRN

## 2012-09-25 MED ORDER — FENTANYL CITRATE 0.05 MG/ML IJ SOLN: 50.0000 ug | Freq: Once | INTRAMUSCULAR | Status: DC

## 2012-09-25 MED ORDER — HEPARIN SODIUM (PORCINE) 1000 UNIT/ML IJ SOLN
INTRAMUSCULAR | Status: DC | PRN
  Administered 2012-09-25: 1000 [IU]

## 2012-09-25 MED ORDER — PROPOFOL 10 MG/ML IV BOLUS
INTRAVENOUS | Status: DC | PRN
  Administered 2012-09-25: 20 mg via INTRAVENOUS
  Administered 2012-09-25: 10 mg via INTRAVENOUS

## 2012-09-25 MED ORDER — SODIUM CHLORIDE 0.9 % IV SOLN
INTRAVENOUS | Status: DC
  Administered 2012-09-25: 13:00:00 via INTRAVENOUS

## 2012-09-25 MED ORDER — SODIUM CHLORIDE 0.9 % IV SOLN: 100.0000 mL | INTRAVENOUS | Status: DC | PRN

## 2012-09-25 MED ORDER — ALTEPLASE 2 MG IJ SOLR: 2.0000 mg | Freq: Once | INTRAMUSCULAR | Status: DC | PRN

## 2012-09-25 MED ORDER — POTASSIUM CHLORIDE CRYS ER 20 MEQ PO TBCR
40.0000 meq | EXTENDED_RELEASE_TABLET | Freq: Three times a day (TID) | ORAL | Status: AC
  Administered 2012-09-25 – 2012-09-26 (×3): 40 meq via ORAL
  Filled 2012-09-25 (×3): qty 2

## 2012-09-25 MED ORDER — HEPARIN SODIUM (PORCINE) 1000 UNIT/ML IJ SOLN
2000.0000 [IU] | Freq: Once | INTRAMUSCULAR | Status: AC
  Administered 2012-09-25: 2000 [IU] via INTRAVENOUS

## 2012-09-25 MED ORDER — HEPARIN SODIUM (PORCINE) 1000 UNIT/ML DIALYSIS: 1000.0000 [IU] | INTRAMUSCULAR | Status: DC | PRN

## 2012-09-25 MED ORDER — HEPARIN SODIUM (PORCINE) 1000 UNIT/ML IJ SOLN
2200.0000 [IU] | Freq: Once | INTRAMUSCULAR | Status: AC
  Administered 2012-09-25: 2200 [IU] via INTRAVENOUS

## 2012-09-25 MED ORDER — POTASSIUM CHLORIDE 10 MEQ/100ML IV SOLN
10.0000 meq | INTRAVENOUS | Status: AC
  Administered 2012-09-25 (×2): 10 meq via INTRAVENOUS
  Filled 2012-09-25 (×3): qty 100

## 2012-09-25 MED ORDER — LIDOCAINE HCL (PF) 1 % IJ SOLN: 5.0000 mL | INTRAMUSCULAR | Status: DC | PRN

## 2012-09-25 MED ORDER — MIDAZOLAM HCL 2 MG/2ML IJ SOLN: 1.0000 mg | INTRAMUSCULAR | Status: DC | PRN

## 2012-09-25 MED ORDER — NEPRO/CARBSTEADY PO LIQD: 237.0000 mL | ORAL | Status: DC | PRN

## 2012-09-25 MED ORDER — OXYCODONE HCL 5 MG/5ML PO SOLN: 5.0000 mg | Freq: Once | ORAL | Status: DC | PRN

## 2012-09-25 MED ORDER — SODIUM CHLORIDE 0.9 % IR SOLN
Status: DC | PRN
  Administered 2012-09-25: 14:00:00

## 2012-09-25 MED ORDER — SODIUM CHLORIDE 0.9 % IR SOLN
Status: DC | PRN
  Administered 2012-09-25: 1000 mL

## 2012-09-25 MED ORDER — MIDAZOLAM HCL 5 MG/5ML IJ SOLN
INTRAMUSCULAR | Status: DC | PRN
  Administered 2012-09-25: 2 mg via INTRAVENOUS

## 2012-09-25 MED ORDER — HEPARIN SODIUM (PORCINE) 1000 UNIT/ML IJ SOLN
INTRAMUSCULAR | Status: AC
  Filled 2012-09-25: qty 1

## 2012-09-25 MED ORDER — PENTAFLUOROPROP-TETRAFLUOROETH EX AERO: 1.0000 "application " | INHALATION_SPRAY | CUTANEOUS | Status: DC | PRN

## 2012-09-25 MED ORDER — CEFAZOLIN SODIUM-DEXTROSE 2-3 GM-% IV SOLR
INTRAVENOUS | Status: AC
  Administered 2012-09-25: 2 g via INTRAVENOUS
  Filled 2012-09-25: qty 50

## 2012-09-25 MED ORDER — ZOLPIDEM TARTRATE 5 MG PO TABS
5.0000 mg | ORAL_TABLET | Freq: Once | ORAL | Status: AC
  Administered 2012-09-25: 5 mg via ORAL
  Filled 2012-09-25: qty 1

## 2012-09-25 MED ORDER — FENTANYL CITRATE 0.05 MG/ML IJ SOLN: 25.0000 ug | INTRAMUSCULAR | Status: DC | PRN

## 2012-09-25 MED ORDER — LIDOCAINE-PRILOCAINE 2.5-2.5 % EX CREA: 1.0000 "application " | TOPICAL_CREAM | CUTANEOUS | Status: DC | PRN

## 2012-09-25 MED ORDER — LIDOCAINE-EPINEPHRINE 0.5 %-1:200000 IJ SOLN
INTRAMUSCULAR | Status: DC | PRN
  Administered 2012-09-25: 30 mL

## 2012-09-25 MED ORDER — SUFENTANIL CITRATE 50 MCG/ML IV SOLN
INTRAVENOUS | Status: DC | PRN
  Administered 2012-09-25: 10 ug via INTRAVENOUS

## 2012-09-25 SURGICAL SUPPLY — 48 items
ADH SKN CLS APL DERMABOND .7 (GAUZE/BANDAGES/DRESSINGS) ×1
BAG DECANTER FOR FLEXI CONT (MISCELLANEOUS) ×2 IMPLANT
CATH CANNON HEMO 15F 50CM (CATHETERS) IMPLANT
CATH CANNON HEMO 15FR 19 (HEMODIALYSIS SUPPLIES) ×1 IMPLANT
CATH CANNON HEMO 15FR 23CM (HEMODIALYSIS SUPPLIES) IMPLANT
CATH CANNON HEMO 15FR 31CM (HEMODIALYSIS SUPPLIES) IMPLANT
CATH CANNON HEMO 15FR 32 (HEMODIALYSIS SUPPLIES) IMPLANT
CATH CANNON HEMO 15FR 32CM (HEMODIALYSIS SUPPLIES) IMPLANT
CLIP LIGATING EXTRA MED SLVR (CLIP) ×2 IMPLANT
CLIP LIGATING EXTRA SM BLUE (MISCELLANEOUS) ×2 IMPLANT
CLOTH BEACON ORANGE TIMEOUT ST (SAFETY) ×2 IMPLANT
COVER PROBE W GEL 5X96 (DRAPES) IMPLANT
COVER SURGICAL LIGHT HANDLE (MISCELLANEOUS) ×2 IMPLANT
DECANTER SPIKE VIAL GLASS SM (MISCELLANEOUS) ×2 IMPLANT
DERMABOND ADVANCED (GAUZE/BANDAGES/DRESSINGS) ×1
DERMABOND ADVANCED .7 DNX12 (GAUZE/BANDAGES/DRESSINGS) IMPLANT
DRAPE C-ARM 42X72 X-RAY (DRAPES) ×2 IMPLANT
DRAPE CHEST BREAST 15X10 FENES (DRAPES) ×2 IMPLANT
GAUZE SPONGE 2X2 8PLY STRL LF (GAUZE/BANDAGES/DRESSINGS) ×1 IMPLANT
GAUZE SPONGE 4X4 16PLY XRAY LF (GAUZE/BANDAGES/DRESSINGS) ×2 IMPLANT
GLOVE BIOGEL PI IND STRL 6.5 (GLOVE) ×2 IMPLANT
GLOVE BIOGEL PI INDICATOR 6.5 (GLOVE) ×2
GLOVE ECLIPSE 6.5 STRL STRAW (GLOVE) ×1 IMPLANT
GLOVE SS BIOGEL STRL SZ 7.5 (GLOVE) ×1 IMPLANT
GLOVE SUPERSENSE BIOGEL SZ 7.5 (GLOVE) ×1
GOWN STRL NON-REIN LRG LVL3 (GOWN DISPOSABLE) ×4 IMPLANT
KIT BASIN OR (CUSTOM PROCEDURE TRAY) ×2 IMPLANT
KIT ROOM TURNOVER OR (KITS) ×2 IMPLANT
NDL HYPO 25GX1X1/2 BEV (NEEDLE) ×1 IMPLANT
NEEDLE 18GX1X1/2 (RX/OR ONLY) (NEEDLE) ×2 IMPLANT
NEEDLE 22X1 1/2 (OR ONLY) (NEEDLE) ×2 IMPLANT
NEEDLE HYPO 25GX1X1/2 BEV (NEEDLE) ×2 IMPLANT
NS IRRIG 1000ML POUR BTL (IV SOLUTION) ×2 IMPLANT
PACK SURGICAL SETUP 50X90 (CUSTOM PROCEDURE TRAY) ×2 IMPLANT
PAD ARMBOARD 7.5X6 YLW CONV (MISCELLANEOUS) ×4 IMPLANT
SOAP 2 % CHG 4 OZ (WOUND CARE) ×2 IMPLANT
SPONGE GAUZE 2X2 STER 10/PKG (GAUZE/BANDAGES/DRESSINGS) ×1
SUT ETHILON 3 0 PS 1 (SUTURE) ×2 IMPLANT
SUT VICRYL 4-0 PS2 18IN ABS (SUTURE) ×2 IMPLANT
SYR 20CC LL (SYRINGE) ×2 IMPLANT
SYR 30ML LL (SYRINGE) IMPLANT
SYR 5ML LL (SYRINGE) ×4 IMPLANT
SYR CONTROL 10ML LL (SYRINGE) ×2 IMPLANT
SYRINGE 10CC LL (SYRINGE) ×2 IMPLANT
TAPE CLOTH SURG 4X10 WHT LF (GAUZE/BANDAGES/DRESSINGS) ×1 IMPLANT
TOWEL OR 17X24 6PK STRL BLUE (TOWEL DISPOSABLE) ×2 IMPLANT
TOWEL OR 17X26 10 PK STRL BLUE (TOWEL DISPOSABLE) ×2 IMPLANT
WATER STERILE IRR 1000ML POUR (IV SOLUTION) ×2 IMPLANT

## 2012-09-25 NOTE — Preoperative (Signed)
Beta Blockers   Reason not to administer Beta Blockers:Not Applicable 

## 2012-09-25 NOTE — Transfer of Care (Signed)
Immediate Anesthesia Transfer of Care Note  Patient: Anita Pollard  Procedure(s) Performed: Procedure(s) with comments: INSERTION OF DIALYSIS CATHETER (Right) - Righ Internal Jugular Placement  Patient Location: PACU  Anesthesia Type:MAC  Level of Consciousness: awake, alert  and oriented  Airway & Oxygen Therapy: Patient Spontanous Breathing and Patient connected to nasal cannula oxygen  Post-op Assessment: Report given to PACU RN, Post -op Vital signs reviewed and stable and Patient moving all extremities  Post vital signs: Reviewed and stable  Complications: No apparent anesthesia complications

## 2012-09-25 NOTE — Procedures (Signed)
I was present at this session.  I have reviewed the session itself and made appropriate changes.  Hd via new PC. Low flows  Malala Trenkamp L 7/2/20143:59 PM

## 2012-09-25 NOTE — Progress Notes (Signed)
Patient ID: Anita Pollard  female  [id number]    DOB: 06-05-1940    DOA: 09/24/2012  PCP: Lawerance Cruel, MD  Assessment/Plan:   Acute kidney injury - unclear etiology, with uremia, metabolic acidosis, hypokalemia.   - renal consulted, workup in progress, dialysis catheter placement today  - Renal ultrasound showed increased echogenicity bilaterally, small left nonobstructive stone without  Hydronephrosis - ASO, hepatitis panel are negative, C3 slightly low - SPEP, UPEP pending, CT abdomen and pelvis ordered to rule out any retroperitoneal fibrosis or lymphoma or infiltrative renal diseases  Hypokalemia:  - Replace IV before the procedure   Hypertension  - Currently stable  Hypothyroidism  -TSH, Continue home Synthroid dose   Diabetes mellitus2  -Check hemoglobin A1c, NovoLog sliding-scale   Metabolic acidosis -Bicarbonate drip per nephrology    history of Right breast cancer  -Dr. Jana Hakim is oncologist, in remission   Normocytic anemia -Suspect due to renal disease, anemia panel also shows iron level of 228 and UIBC <15 -  hemoglobin down to 6.7 today, check stool occult test.  -  transfuse 1 unit packed RBC prior to the procedure  DVT Prophylaxis:SCDs  Code Status:  Disposition:    Subjective: denies any specific complaints, family member at the bedside   Objective: Weight change:   Intake/Output Summary (Last 24 hours) at 09/25/12 1146 Last data filed at 09/25/12 0700  Gross per 24 hour  Intake      0 ml  Output   1050 ml  Net  -1050 ml   Blood pressure 128/47, pulse 65, temperature 98.8 F (37.1 C), temperature source Oral, resp. rate 18, height 5\' 1"  (1.549 m), weight 59.058 kg (130 lb 3.2 oz), SpO2 95.00%.  Physical Exam: General: Alert and awake, oriented x3, not in any acute distress. CVS: S1-S2 clear, no murmur rubs or gallops Chest: clear to auscultation bilaterally, no wheezing, rales or rhonchi Abdomen: soft nontender, nondistended,  normal bowel sounds  Extremities: no cyanosis, clubbing or edema noted bilaterally   Lab Results: Basic Metabolic Panel:  Recent Labs Lab 09/24/12 1052 09/24/12 1359 09/25/12 0639  NA 138  --  141  K 3.5  --  2.1*  CL 102  --  100  CO2 11*  --  19  GLUCOSE 121*  --  139*  BUN 166*  --  166*  CREATININE 12.20*  --  11.62*  CALCIUM 7.5*  --  7.1*  MG  --  2.7*  --   PHOS  --   --  8.6*   Liver Function Tests:  Recent Labs Lab 09/24/12 1052 09/25/12 0639  AST 5 <5  ALT 15 12  ALKPHOS 49 44  BILITOT 0.3 0.3  PROT 6.4 5.6*  ALBUMIN 3.6 3.1*   No results found for this basename: LIPASE, AMYLASE,  in the last 168 hours No results found for this basename: AMMONIA,  in the last 168 hours CBC:  Recent Labs Lab 09/24/12 1052 09/25/12 0639  WBC 6.1 4.9  NEUTROABS 4.2  --   HGB 8.1* 6.7*  HCT 22.1* 18.3*  MCV 86.0 83.6  PLT 136* 122*   Cardiac Enzymes:  Recent Labs Lab 09/24/12 1359  CKTOTAL 148   BNP: No components found with this basename: POCBNP,  CBG: No results found for this basename: GLUCAP,  in the last 168 hours   Micro Results: No results found for this or any previous visit (from the past 240 hour(s)).  Studies/Results: US Renal  09/24/2012   *  RADIOLOGY REPORT*  Clinical Data: Elevated creatinine  RENAL/URINARY TRACT ULTRASOUND COMPLETE  Comparison:  09/02/2012  Findings:  Right Kidney:  10.5 cm in length.  The kidney is extremely echogenic consistent with medical renal disease and stable from prior exam.  Left Kidney:  10.8 cm in length.  Increased echogenicity is noted similar to that seen on prior study.  A tiny 6 mm cyst is noted. Echogenic focus in the lower pole of the left kidney consistent with a nonobstructing stone.  This is stable from prior exam.  Bladder:  Partially decompressed  IMPRESSION: No mass lesion or hydronephrosis is noted.  Increased echogenicity is identified consistent with medical renal disease.  Stable nonobstructing left  renal stone   Original Report Authenticated By: Inez Catalina, M.D.   US Renal  09/02/2012   *RADIOLOGY REPORT*  Clinical Data: Acute renal failure  RENAL/URINARY TRACT ULTRASOUND COMPLETE  Comparison:  CT virtual colonoscopy of 12/02/2010  Findings:  Right Kidney:  No hydronephrosis is seen.  The right kidney measures 10.8 cm sagittally.  However, the parenchyma of the right kidney is markedly echogenic consistent with chronic renal medical disease.  Left Kidney:  No hydronephrosis is noted.  The left kidney measures 10.4 cm.  The parenchyma also is very echogenic consistent with chronic renal medical disease.  Probable nonshadowing calculi are noted.  A lower pole cyst is present of 0.6 mm in diameter.  Bladder:  The urinary bladder is not distended cannot be well evaluated.  IMPRESSION:  1.  No hydronephrosis. 2.  Very echogenic renal parenchyma bilaterally consistent with chronic renal medical disease. 3.  Echogenic foci in the left kidney consistent with calculi which are not obstructing.   Original Report Authenticated By: Ivar Drape, M.D.   Mm Digital Diagnostic Bilat  08/29/2012   *RADIOLOGY REPORT*  Clinical Data:  Malignant lumpectomy of the right breast in June, 2010.  Annual evaluation.  DIGITAL DIAGNOSTIC BILATERAL MAMMOGRAM WITH CAD  Comparison: 08/28/2011, 05/07 03/2010, dating back to 07/28/2008.  Findings:  ACR Breast Density Category 3: The breast tissue is heterogeneously dense.  CC and MLO views of both breasts and a spot tangential view of the right breast at the lumpectomy site were obtained.  Post lumpectomy scarring in the retroareolar right breast.  There are no findings suspicious for malignancy.  Mammographic images were processed with CAD.  IMPRESSION: No specific mammographic evidence of malignancy.  Post lumpectomy scarring in the retroareolar right breast.  RECOMMENDATION: Bilateral diagnostic mammography in 1 year.  The patient was encouraged to perform monthly self breast  examination and communicate any changes with her primary physician. I have discussed the findings and recommendations with the patient. Results were also provided in writing at the conclusion of the visit.  If applicable, a reminder letter will be sent to the patient regarding her next appointment.  BI-RADS CATEGORY 2:  Benign finding(s).   Original Report Authenticated By: Evangeline Dakin, M.D.    Medications: Scheduled Meds: . levothyroxine  150 mcg Oral QAC breakfast  . multivitamin  1 tablet Oral Daily  . potassium chloride  40 mEq Oral TID  . sevelamer carbonate  4.8 g Oral TID WC      LOS: 1 day   Malachai Schalk M.D. [facility] 09/25/2012, 11:46 AM Pager: [phone]  If 7PM-7AM, please contact night-coverage [email or web address] Password [email or web address]

## 2012-09-25 NOTE — Anesthesia Postprocedure Evaluation (Signed)
  Anesthesia Post-op Note  Patient: Anita Pollard  Procedure(s) Performed: Procedure(s) with comments: INSERTION OF DIALYSIS CATHETER (Right) - Righ Internal Jugular Placement  Patient Location: PACU  Anesthesia Type:MAC  Level of Consciousness: awake  Airway and Oxygen Therapy: Patient Spontanous Breathing  Post-op Pain: none  Post-op Assessment: Post-op Vital signs reviewed, Patient's Cardiovascular Status Stable, Respiratory Function Stable, Patent Airway, No signs of Nausea or vomiting and Pain level controlled  Post-op Vital Signs: stable  Complications: No apparent anesthesia complications

## 2012-09-25 NOTE — Progress Notes (Signed)
[facility] - PROGRESS NOTE Resident Note   Please see below for attending addendum to resident note.  Subjective:   She reports itching, nausea and hiccups for a several weeks. She denies any active bleeding. She has no hematuria, no bloody stools. I went over her med list again. She takes several OTC supplements.  She denies use of goody powder, no medications for chronic pain. She also denies ingestion of any other toxins like ethylene glycol, methanol or ethanol.   Objective:    Vital Signs:   Temp:  [97.8 F (36.6 C)-98.8 F (37.1 C)] 98.8 F (37.1 C) (07/02 0858) Pulse Rate:  [65-81] 65 (07/02 0858) Resp:  [13-18] 18 (07/02 0858) BP: (120-167)/(42-63) 128/47 mmHg (07/02 0858) SpO2:  [95 %-100 %] 95 % (07/02 0858) Weight:  [130 lb 3.2 oz (59.058 kg)] 130 lb 3.2 oz (59.058 kg) (07/01 2119) Last BM Date: 09/25/12  24-hour weight change: Weight change:   Weight trends: Filed Weights   09/24/12 2119  Weight: 130 lb 3.2 oz (59.058 kg)    Intake/Output:  07/01 0701 - 07/02 0700 In: 0  Out: 1050 [Urine:1050]  Net - 1.5L over the last 254 hours   Physical Exam: family including daughter, son-in-law at bed side. General: Vital signs reviewed and noted. Well-developed, well-nourished, in no acute distress; alert, appropriate and cooperative throughout examination.  Lungs:  Normal respiratory effort. Clear to auscultation BL without crackles or wheezes.  Heart: RRR. S1 and S2 normal without gallop, murmur, or rubs.  Abdomen:  BS normoactive. Soft, Nondistended, non-tender.  No masses or organomegaly.  Extremities: Mild pretibial edema.    Labs: Basic Metabolic Panel:  Recent Labs Lab 09/24/12 1052 09/24/12 1359 09/25/12 0639  NA 138  --  141  K 3.5  --  2.1*  CL 102  --  100  CO2 11*  --  19  GLUCOSE 121*  --  139*  BUN 166*  --  166*  CREATININE 12.20*  --  11.62*  CALCIUM 7.5*  --  7.1*  MG  --  2.7*  --   PHOS  --   --  8.6*    Liver  Function Tests:  Recent Labs Lab 09/24/12 1052 09/25/12 0639  AST 5 <5  ALT 15 12  ALKPHOS 49 44  BILITOT 0.3 0.3  PROT 6.4 5.6*  ALBUMIN 3.6 3.1*    CBC:  Recent Labs Lab 09/24/12 1052 09/25/12 0639  WBC 6.1 4.9  NEUTROABS 4.2  --   HGB 8.1* 6.7*  HCT 22.1* 18.3*  MCV 86.0 83.6  PLT 136* 122*    Cardiac Enzymes:  Recent Labs Lab 09/24/12 1359  CKTOTAL 148    Coagulation Studies:  Recent Labs  09/25/12 0639  LABPROT 15.7*  INR 1.28    Urinalysis:    Component Value Date/Time   COLORURINE YELLOW 09/24/2012 1351   APPEARANCEUR HAZY* 09/24/2012 1351   LABSPEC 1.012 09/24/2012 1351   PHURINE 5.0 09/24/2012 1351   GLUCOSEU NEGATIVE 09/24/2012 1351   HGBUR TRACE* 09/24/2012 1351   BILIRUBINUR NEGATIVE 09/24/2012 1351   KETONESUR NEGATIVE 09/24/2012 1351   PROTEINUR NEGATIVE 09/24/2012 1351   UROBILINOGEN 0.2 09/24/2012 1351   NITRITE NEGATIVE 09/24/2012 1351   LEUKOCYTESUR NEGATIVE 09/24/2012 1351     Imaging: US Renal  09/24/2012   *RADIOLOGY REPORT*  Clinical Data: Elevated creatinine  RENAL/URINARY TRACT ULTRASOUND COMPLETE  Comparison:  09/02/2012  Findings:  Right Kidney:  10.5 cm in length.  The kidney is  extremely echogenic consistent with medical renal disease and stable from prior exam.  Left Kidney:  10.8 cm in length.  Increased echogenicity is noted similar to that seen on prior study.  A tiny 6 mm cyst is noted. Echogenic focus in the lower pole of the left kidney consistent with a nonobstructing stone.  This is stable from prior exam.  Bladder:  Partially decompressed  IMPRESSION: No mass lesion or hydronephrosis is noted.  Increased echogenicity is identified consistent with medical renal disease.  Stable nonobstructing left renal stone   Original Report Authenticated By: Inez Catalina, M.D.      Medications:    Infusions: .  sodium bicarbonate  infusion 1000 mL 100 mL/hr at 09/25/12 0151    Scheduled Medications: . heparin  5,000 Units Subcutaneous Q8H  .  levothyroxine  150 mcg Oral QAC breakfast  . multivitamin  1 tablet Oral Daily  . potassium chloride  10 mEq Intravenous Q1 Hr x 3  . sevelamer carbonate  4.8 g Oral TID WC      Assessment/ Plan:    Pt is a 72 y.o. yo female with a PMHx of HTN, diabetes in remission, hypothyroidism, anemia, cancer of right breast followed by oncology, who was admitted on 09/24/2012 for a noted rapid raising of her creatinine level during outpatient labs. Renal service is consulted for further of the cause of this and further management.  # Acute Renal Injury: with uremia severe uremia and hypokalemia, Anion gap metabolic acidosis. Uremic symptoms with hiccups, itching and restless legs. Urine output 1.5L. Etiology still unclear. Normal renal function 08/24/2011 with a creatinine of 0.81. Between 08/12/2012 ser Cr has rapidly increased from 2.34 to a peak of 12.2 on admission. Urinalysis unrevealing. Renal ultrasound revealed a small left non-obstructive stone without hydronephrosis. Kidneys have increased echogenicity bilaterally. Other labs including ASO, complement levels, hepatitis panel are negative. ATN, obstruction from retroperitoneal fibrosis, and infiltrative renal disease (e.g lymphocytosis and plasmacytosis) are all part of the differential. GN is unlikely with bland urinalysis. Likely has chronic renal disease as well which has been indolent.  Plan  - continue with bicarb drip  - HD cath placement (INR normal and platelets 122) - will require at least temporally dialysis - trend BMET - UPEP - pending  - serum free light chains  - Strict ins/out  - daily weights- patient appears euvolemic /a little dry at this time - abdominal/pelvis CT without contrast  - may ultimately require renal bx   # Hypokalemia: K 2.1 <3.5. Likely related to AKI with K redistribution following Bicarb infusion overnight. No evidence of GI or renal losses. Mg is slightly high at 2.7. I don't suspect RTA given increased anion  gap metabolic acidosis.  Plan  - replacement with IV KCL  - repeat BMET at 2 pm for K level.   #Hypochronic Normocytic Anemia: Hgb 8.1 >>6.7. Normal baseline in 07/2011. Related to CKD with a component of hemodilution. Iron/anemia panel reveals iron level of 228 and UIBC <15. She has not responded to outpatient Darbepoetin. She is currently asymptomatic.  Plan  - transfuse with 1 unit of PRBC - goal above 8 - Check post transfusion Hb -check FOBT  #Thrombocytopenia: Plt 136>122. Normal baseline. Likely related to OTC medication. No active bleeding. Will continue to follow  # Hypertension: BP currently stable. Hold home amlodipine. Will continue to monitor.  #Hypothyroidism: Continue with Synthroid.    Length of Stay: 1 days  Patient history and plan of care reviewed with  attending, Dr. Jimmy Footman.  Signed:  Jessee Avers, MD PGY-2 Internal Medicine Teaching Service Pager: [phone] 09/25/2012, 12:06 PM  I have seen and examined this patient and agree with the plan of care . Patient examined, discussed with resident, patient, family, and primary MD.  Will do HD today after PC, then await CT scan.  If no answers, anticipate renal biopsy. .  Kalan Rinn L 09/25/2012, 2:01 PM

## 2012-09-25 NOTE — Progress Notes (Signed)
CRITICAL VALUE ALERT  Critical value received:  Collagen/Epineph. 270, Collagen/ADP 121  Date of notification:  09/25/12  Time of notification:  [id number]  Critical value read back: yes  Nurse who received alert:  KGoodnight  MD notified (1st page):  Rai  Time of first page:  1346  MD notified (2nd page):  Time of second page:  Responding MD:    Time MD responded:

## 2012-09-25 NOTE — Anesthesia Preprocedure Evaluation (Addendum)
Anesthesia Evaluation  Patient identified by MRN, date of birth, ID band Patient awake    Reviewed: Allergy & Precautions, H&P , NPO status , Patient's Chart, lab work & pertinent test results  Airway Mallampati: I TM Distance: <3 FB Neck ROM: Full    Dental   Pulmonary  breath sounds clear to auscultation        Cardiovascular hypertension, + Peripheral Vascular Disease Rhythm:Regular Rate:Normal     Neuro/Psych    GI/Hepatic GERD-  ,  Endo/Other  diabetesHypothyroidism   Renal/GU ESRFRenal disease     Musculoskeletal   Abdominal   Peds  Hematology   Anesthesia Other Findings   Reproductive/Obstetrics                           Anesthesia Physical Anesthesia Plan  ASA: III  Anesthesia Plan: MAC   Post-op Pain Management:    Induction: Intravenous  Airway Management Planned: Simple Face Mask and Nasal Cannula  Additional Equipment:   Intra-op Plan:   Post-operative Plan:   Informed Consent: I have reviewed the patients History and Physical, chart, labs and discussed the procedure including the risks, benefits and alternatives for the proposed anesthesia with the patient or authorized representative who has indicated his/her understanding and acceptance.   Dental advisory given  Plan Discussed with: CRNA, Surgeon and Anesthesiologist  Anesthesia Plan Comments:        Anesthesia Quick Evaluation

## 2012-09-25 NOTE — Progress Notes (Signed)
Questions answered regarding diatek placement.  Pt getting transfusion and potassium replacement this morning per primary team  Ruta Hinds, MD [facility] Office: [phone] Pager: [phone]

## 2012-09-25 NOTE — Progress Notes (Signed)
CRITICAL VALUE ALERT  Critical value received: HGB 6.7   Date of notification:  09/25/12  Time of notification:  0700  Critical value read back  yes  Nurse who received alert:  Malachy Mood RN  MD notified (1st page):  DR RAI  Time of first page:  07:10  MD notified (2nd page): no  Time of second page: no  Responding MD:  DR RAI  Time MD responded:  07:10

## 2012-09-25 NOTE — Op Note (Signed)
OPERATIVE REPORT  Date of Surgery: 09/24/2012 - 09/25/2012  Surgeon: Tinnie Gens, MD  Assistant: Nurse  Pre-op Diagnosis: ACUTE RENAL FAILURE  Post-op Diagnosis: Acute Renal Failure  Procedure: Procedure(s): #1 bilateral ultrasound localization internal jugular veins #2 insertion hemodialysis catheter via right IJ-19 cm Anesthesia: MAC  EBL: Minimal  Complications: None  Procedure Details: Patient to the operating room placed in the supine position at which time the upper chest and neck were prepped with Betadine scrub and solution draped in routine sterile manner. Both internal jugular veins are imaged using B-mode ultrasound. Both internal jugular veins were widely patent. After infiltration with 1% Xylocaine with epinephrine right IJ was entered using a supraclavicular approach guidewire passed in the right atrium under fluoroscopic guidance. After dilating the track appropriately a 19 cm hemodialysis cath was passed and the peel-away sheath position in the right atrium, peripherally secured with nylon sutures. Was closed with Vicryl in a subcuticular fashion sterile dressing applied patient taken to recovery in stable condition for chest x-ray   Tinnie Gens, MD 09/25/2012 1:50 PM

## 2012-09-25 NOTE — Interval H&P Note (Signed)
History and Physical Interval Note:  09/25/2012 12:43 PM  Anita Pollard  has presented today for surgery, with the diagnosis of ACUTE RENAL FAILURE  The various methods of treatment have been discussed with the patient and family. After consideration of risks, benefits and other options for treatment, the patient has consented to  Procedure(s): INSERTION OF DIALYSIS CATHETER (N/A) as a surgical intervention .  The patient's history has been reviewed, patient examined, no change in status, stable for surgery.  I have reviewed the patient's chart and labs.  Questions were answered to the patient's satisfaction.     Tinnie Gens

## 2012-09-25 NOTE — Progress Notes (Signed)
CRITICAL VALUE ALERT  Critical value received:  K=2.6  Date of notification:  09/25/12  Time of notification:  [id number]  Critical value read back:yes  Nurse who received alert:  Ranelle Oyster   MD notified (1st page):  Deterding,James  Time of first page:  1715  MD notified (2nd page):  Time of second page:  Responding MD:  Deterding,James  Time MD responded:  [phone]

## 2012-09-25 NOTE — H&P (View-Only) (Signed)
Questions answered regarding diatek placement.  Pt getting transfusion and potassium replacement this morning per primary team  Ruta Hinds, MD [facility] Office: [phone] Pager: [phone]

## 2012-09-25 NOTE — Progress Notes (Signed)
Pt unable to tolerate potassium running at rate of 100. Decreased to 50cc/hr.

## 2012-09-25 NOTE — Progress Notes (Signed)
UR COMPLETED  

## 2012-09-26 ENCOUNTER — Inpatient Hospital Stay (HOSPITAL_COMMUNITY): Payer: Medicare Other

## 2012-09-26 ENCOUNTER — Encounter (HOSPITAL_COMMUNITY): Payer: Self-pay | Admitting: *Deleted

## 2012-09-26 LAB — HEPATITIS B SURFACE ANTIBODY,QUALITATIVE: Hep B S Ab: NONREACTIVE

## 2012-09-26 LAB — CBC
HCT: 21.8 % — ABNORMAL LOW (ref 36.0–46.0)
Hemoglobin: 7.9 g/dL — ABNORMAL LOW (ref 12.0–15.0)
MCHC: 36.2 g/dL — ABNORMAL HIGH (ref 30.0–36.0)

## 2012-09-26 LAB — PROTEIN ELECTROPHORESIS, SERUM
Albumin ELP: 62.6 % (ref 55.8–66.1)
Alpha-1-Globulin: 5.3 % — ABNORMAL HIGH (ref 2.9–4.9)
Beta 2: 4.3 % (ref 3.2–6.5)
Beta Globulin: 5.4 % (ref 4.7–7.2)

## 2012-09-26 LAB — KAPPA/LAMBDA LIGHT CHAINS
Kappa free light chain: 9.02 mg/dL — ABNORMAL HIGH (ref 0.33–1.94)
Kappa, lambda light chain ratio: 2.33 — ABNORMAL HIGH (ref 0.26–1.65)
Lambda free light chains: 3.87 mg/dL — ABNORMAL HIGH (ref 0.57–2.63)

## 2012-09-26 LAB — RENAL FUNCTION PANEL
BUN: 58 mg/dL — ABNORMAL HIGH (ref 6–23)
CO2: 23 mEq/L (ref 19–32)
Glucose, Bld: 129 mg/dL — ABNORMAL HIGH (ref 70–99)
Phosphorus: 3.6 mg/dL (ref 2.3–4.6)
Potassium: 3.3 mEq/L — ABNORMAL LOW (ref 3.5–5.1)

## 2012-09-26 LAB — BASIC METABOLIC PANEL
CO2: 27 mEq/L (ref 19–32)
Chloride: 100 mEq/L (ref 96–112)
Glucose, Bld: 116 mg/dL — ABNORMAL HIGH (ref 70–99)
Potassium: 3.5 mEq/L (ref 3.5–5.1)
Sodium: 138 mEq/L (ref 135–145)

## 2012-09-26 LAB — UIFE/LIGHT CHAINS/TP QN, 24-HR UR
Alpha 2, Urine: DETECTED — AB
Free Kappa/Lambda Ratio: 7.93 ratio (ref 2.04–10.37)
Total Protein, Urine: 18.6 mg/dL

## 2012-09-26 LAB — TYPE AND SCREEN: ABO/RH(D): A POS

## 2012-09-26 LAB — FOLATE RBC: RBC Folate: 1774 ng/mL — ABNORMAL HIGH (ref >=366)

## 2012-09-26 MED ORDER — SODIUM CHLORIDE 0.9 % IJ SOLN
3.0000 mL | Freq: Two times a day (BID) | INTRAMUSCULAR | Status: DC
  Administered 2012-09-27 – 2012-10-07 (×11): 3 mL via INTRAVENOUS

## 2012-09-26 MED ORDER — ZOLPIDEM TARTRATE 5 MG PO TABS
5.0000 mg | ORAL_TABLET | Freq: Every evening | ORAL | Status: AC | PRN
  Administered 2012-09-26 (×2): 5 mg via ORAL
  Filled 2012-09-26 (×2): qty 1

## 2012-09-26 MED ORDER — POTASSIUM CHLORIDE CRYS ER 20 MEQ PO TBCR: 40.0000 meq | EXTENDED_RELEASE_TABLET | Freq: Once | ORAL | Status: DC

## 2012-09-26 MED ORDER — LEVOTHYROXINE SODIUM 125 MCG PO TABS
125.0000 ug | ORAL_TABLET | Freq: Every day | ORAL | Status: DC
  Administered 2012-09-27 – 2012-09-29 (×3): 125 ug via ORAL
  Filled 2012-09-26 (×4): qty 1

## 2012-09-26 MED ORDER — IOHEXOL 300 MG/ML  SOLN
25.0000 mL | INTRAMUSCULAR | Status: AC
  Administered 2012-09-26 (×2): 25 mL via ORAL

## 2012-09-26 NOTE — Progress Notes (Signed)
First visit with pt as per charge nurse's referral. I introduced myself and my role to the pt. Pt was alert and oriented when I visited with her. Pt stated that she is doing well and is still awaiting the doctor's orders pending results from her tests. Pt was with her daughter when I visited with her. We engaged in a light conversation about the pt's family, and pt lightened up when discussing her family. Pt and her daughter expressed appreciation for my visit.    Elmarie Shiley Counselor Intern [facility]ling Cruz

## 2012-09-26 NOTE — Progress Notes (Signed)
Patient ID: Anita Pollard  female  [id number]    DOB: Aug 08, 1940    DOA: 09/24/2012  PCP: Lawerance Cruel, MD  Assessment/Plan:   Acute kidney injury - unclear etiology, with uremia, metabolic acidosis, hypokalemia.   - renal consulted, workup in progress, dialysis catheter placed, started on HD 09/25/12 x 1. Cr down to 5.7   - Renal ultrasound showed increased echogenicity bilaterally, small left nonobstructive stone without  Hydronephrosis - ASO, hepatitis panel are negative, C3 slightly low - SPEP, UPEP pending,  - CT abdomen and pelvis still pending  Hypokalemia:  - Replaced  Hypertension  - Currently stable  Hypothyroidism  -TSH 0.163, reduce home Synthroid dose to 153mcg  Diabetes mellitus2  -hemoglobin A1c 6.8, NovoLog sliding-scale   Metabolic acidosis - improved, still on bicarbonate drip per nephrology    history of Right breast cancer  -Dr. Jana Hakim is oncologist, in remission   Normocytic anemia -Suspect due to renal disease, repeat anemia panel also shows iron level of 188, ferritin 196, TIBC 214 -  Hemoglobin 7.9, FOBT pending, s/p 1 unit packed RBC on 7/2  DVT Prophylaxis:SCDs  Code Status:  Disposition:    Subjective: denies any specific complaints, feels okay  Objective: Weight change: 0.842 kg (1 lb 13.7 oz)  Intake/Output Summary (Last 24 hours) at 09/26/12 0947 Last data filed at 09/26/12 0544  Gross per 24 hour  Intake   1260 ml  Output    545 ml  Net    715 ml   Blood pressure 156/56, pulse 83, temperature 97.6 F (36.4 C), temperature source Oral, resp. rate 16, height 5\' 1"  (1.549 m), weight 60.011 kg (132 lb 4.8 oz), SpO2 96.00%.  Physical Exam: General: Alert and awake, oriented x3, NAD CVS: S1-S2 clear, no murmur rubs or gallops Chest: CTAB, no wheezing, rales or rhonchi Abdomen: soft NT, ND, NBS  Extremities: no c/c/e bilaterally   Lab Results: Basic Metabolic Panel:  Recent Labs Lab 09/24/12 1359  09/25/12 1601  09/25/12 1920 09/26/12 0610  NA  --   < > 141  --  142  K  --   < > 2.6* 2.8* 3.3*  CL  --   < > 100  --  105  CO2  --   < > 18*  --  23  GLUCOSE  --   < > 103*  --  129*  BUN  --   < > 152*  --  58*  CREATININE  --   < > 11.73*  --  5.79*  CALCIUM  --   < > 6.8*  --  7.2*  MG 2.7*  --   --   --   --   PHOS  --   < >  --   --  3.6  < > = values in this interval not displayed. Liver Function Tests:  Recent Labs Lab 09/24/12 1052 09/25/12 0639 09/26/12 0610  AST 5 <5  --   ALT 15 12  --   ALKPHOS 49 44  --   BILITOT 0.3 0.3  --   PROT 6.4 5.6*  --   ALBUMIN 3.6 3.1* 3.0*   No results found for this basename: LIPASE, AMYLASE,  in the last 168 hours No results found for this basename: AMMONIA,  in the last 168 hours CBC:  Recent Labs Lab 09/24/12 1052 09/25/12 0639 09/26/12 0610  WBC 6.1 4.9 3.9*  NEUTROABS 4.2  --   --   HGB 8.1* 6.7* 7.9*  HCT 22.1* 18.3* 21.8*  MCV 86.0 83.6 84.8  PLT 136* 122* 113*   Cardiac Enzymes:  Recent Labs Lab 09/24/12 1359  CKTOTAL 148   BNP: No components found with this basename: POCBNP,  CBG:  Recent Labs Lab 09/25/12 1401  GLUCAP 138*     Micro Results: No results found for this or any previous visit (from the past 240 hour(s)).  Studies/Results: US Renal  09/24/2012   *RADIOLOGY REPORT*  Clinical Data: Elevated creatinine  RENAL/URINARY TRACT ULTRASOUND COMPLETE  Comparison:  09/02/2012  Findings:  Right Kidney:  10.5 cm in length.  The kidney is extremely echogenic consistent with medical renal disease and stable from prior exam.  Left Kidney:  10.8 cm in length.  Increased echogenicity is noted similar to that seen on prior study.  A tiny 6 mm cyst is noted. Echogenic focus in the lower pole of the left kidney consistent with a nonobstructing stone.  This is stable from prior exam.  Bladder:  Partially decompressed  IMPRESSION: No mass lesion or hydronephrosis is noted.  Increased echogenicity is identified consistent  with medical renal disease.  Stable nonobstructing left renal stone   Original Report Authenticated By: Inez Catalina, M.D.   US Renal  09/02/2012   *RADIOLOGY REPORT*  Clinical Data: Acute renal failure  RENAL/URINARY TRACT ULTRASOUND COMPLETE  Comparison:  CT virtual colonoscopy of 12/02/2010  Findings:  Right Kidney:  No hydronephrosis is seen.  The right kidney measures 10.8 cm sagittally.  However, the parenchyma of the right kidney is markedly echogenic consistent with chronic renal medical disease.  Left Kidney:  No hydronephrosis is noted.  The left kidney measures 10.4 cm.  The parenchyma also is very echogenic consistent with chronic renal medical disease.  Probable nonshadowing calculi are noted.  A lower pole cyst is present of 0.6 mm in diameter.  Bladder:  The urinary bladder is not distended cannot be well evaluated.  IMPRESSION:  1.  No hydronephrosis. 2.  Very echogenic renal parenchyma bilaterally consistent with chronic renal medical disease. 3.  Echogenic foci in the left kidney consistent with calculi which are not obstructing.   Original Report Authenticated By: Ivar Drape, M.D.   Mm Digital Diagnostic Bilat  08/29/2012   *RADIOLOGY REPORT*  Clinical Data:  Malignant lumpectomy of the right breast in June, 2010.  Annual evaluation.  DIGITAL DIAGNOSTIC BILATERAL MAMMOGRAM WITH CAD  Comparison: 08/28/2011, 05/07 03/2010, dating back to 07/28/2008.  Findings:  ACR Breast Density Category 3: The breast tissue is heterogeneously dense.  CC and MLO views of both breasts and a spot tangential view of the right breast at the lumpectomy site were obtained.  Post lumpectomy scarring in the retroareolar right breast.  There are no findings suspicious for malignancy.  Mammographic images were processed with CAD.  IMPRESSION: No specific mammographic evidence of malignancy.  Post lumpectomy scarring in the retroareolar right breast.  RECOMMENDATION: Bilateral diagnostic mammography in 1 year.  The patient  was encouraged to perform monthly self breast examination and communicate any changes with her primary physician. I have discussed the findings and recommendations with the patient. Results were also provided in writing at the conclusion of the visit.  If applicable, a reminder letter will be sent to the patient regarding her next appointment.  BI-RADS CATEGORY 2:  Benign finding(s).   Original Report Authenticated By: Evangeline Dakin, M.D.    Medications: Scheduled Meds: . [START ON 09/27/2012] levothyroxine  125 mcg Oral QAC breakfast  . multivitamin  1 tablet Oral Daily  . potassium chloride  40 mEq Oral TID  . sevelamer carbonate  4.8 g Oral TID WC      LOS: 2 days   Marielys Trinidad M.D. [facility] 09/26/2012, 9:47 AM Pager: [phone]  If 7PM-7AM, please contact night-coverage [email or web address] Password [email or web address]

## 2012-09-26 NOTE — Progress Notes (Signed)
[facility] - PROGRESS NOTE Resident Note   Please see below for attending addendum to resident note.  Subjective:   She has no complaints today. HD cath placed yesterday. Had dialysis yesterday with 260 cc off.   Objective:    Vital Signs:   Temp:  [97.6 F (36.4 C)-98.8 F (37.1 C)] 97.6 F (36.4 C) (07/03 0750) Pulse Rate:  [57-83] 83 (07/03 0750) Resp:  [16-18] 16 (07/03 0544) BP: (127-173)/(47-74) 156/56 mmHg (07/03 0750) SpO2:  [95 %-100 %] 96 % (07/03 0750) Weight:  [132 lb 0.9 oz (59.9 kg)-132 lb 4.8 oz (60.011 kg)] 132 lb 4.8 oz (60.011 kg) (07/02 2000) Last BM Date: 09/25/12  24-hour weight change: Weight change: 1 lb 13.7 oz (0.842 kg)  Weight trends: Filed Weights   09/24/12 2119 09/25/12 1902 09/25/12 2000  Weight: 130 lb 3.2 oz (59.058 kg) 132 lb 0.9 oz (59.9 kg) 132 lb 4.8 oz (60.011 kg)    Intake/Output:  07/02 0701 - 07/03 0700 In: 1260 [P.O.:60; I.V.:850; Blood:350] Out: 545 [Urine:750]  Net: +545 cc in the last 24 hours.    Physical Exam: family including daughter, son-in-law at bed side. General: Vital signs reviewed and noted. Well-developed, well-nourished, in no acute distress; alert, appropriate and cooperative throughout examination.  Lungs:  Normal respiratory effort. Clear to auscultation BL without crackles or wheezes.  Heart: RRR. S1 and S2 normal without gallop, murmur, or rubs.  Abdomen:  BS normoactive. Soft, Nondistended, non-tender.  No masses or organomegaly.  Extremities: Mild pretibial edema.    Labs: Basic Metabolic Panel:  Recent Labs Lab 09/24/12 1052 09/24/12 1359 09/25/12 0639 09/25/12 1601 09/25/12 1920 09/26/12 0610  NA 138  --  141 141  --  142  K 3.5  --  2.1* 2.6* 2.8* 3.3*  CL 102  --  100 100  --  105  CO2 11*  --  19 18*  --  23  GLUCOSE 121*  --  139* 103*  --  129*  BUN 166*  --  166* 152*  --  58*  CREATININE 12.20*  --  11.62* 11.73*  --  5.79*  CALCIUM 7.5*  --  7.1* 6.8*  --  7.2*   MG  --  2.7*  --   --   --   --   PHOS  --   --  8.6*  --   --  3.6    Liver Function Tests:  Recent Labs Lab 09/24/12 1052 09/25/12 0639 09/26/12 0610  AST 5 <5  --   ALT 15 12  --   ALKPHOS 49 44  --   BILITOT 0.3 0.3  --   PROT 6.4 5.6*  --   ALBUMIN 3.6 3.1* 3.0*    CBC:  Recent Labs Lab 09/24/12 1052 09/25/12 0639 09/26/12 0610  WBC 6.1 4.9 3.9*  NEUTROABS 4.2  --   --   HGB 8.1* 6.7* 7.9*  HCT 22.1* 18.3* 21.8*  MCV 86.0 83.6 84.8  PLT 136* 122* 113*    Cardiac Enzymes:  Recent Labs Lab 09/24/12 1359  CKTOTAL 148    Coagulation Studies:  Recent Labs  09/25/12 0639 09/25/12 1015  LABPROT 15.7* 14.8  INR 1.28 1.19    Urinalysis:    Component Value Date/Time   COLORURINE YELLOW 09/24/2012 1351   APPEARANCEUR HAZY* 09/24/2012 1351   LABSPEC 1.012 09/24/2012 1351   PHURINE 5.0 09/24/2012 1351   GLUCOSEU NEGATIVE 09/24/2012 1351   HGBUR TRACE* 09/24/2012 1351  BILIRUBINUR NEGATIVE 09/24/2012 1351   KETONESUR NEGATIVE 09/24/2012 1351   PROTEINUR NEGATIVE 09/24/2012 1351   UROBILINOGEN 0.2 09/24/2012 1351   NITRITE NEGATIVE 09/24/2012 1351   LEUKOCYTESUR NEGATIVE 09/24/2012 1351     Imaging: US Renal  09/24/2012   *RADIOLOGY REPORT*  Clinical Data: Elevated creatinine  RENAL/URINARY TRACT ULTRASOUND COMPLETE  Comparison:  09/02/2012  Findings:  Right Kidney:  10.5 cm in length.  The kidney is extremely echogenic consistent with medical renal disease and stable from prior exam.  Left Kidney:  10.8 cm in length.  Increased echogenicity is noted similar to that seen on prior study.  A tiny 6 mm cyst is noted. Echogenic focus in the lower pole of the left kidney consistent with a nonobstructing stone.  This is stable from prior exam.  Bladder:  Partially decompressed  IMPRESSION: No mass lesion or hydronephrosis is noted.  Increased echogenicity is identified consistent with medical renal disease.  Stable nonobstructing left renal stone   Original Report Authenticated By:  Inez Catalina, M.D.   Dg Chest Port 1 View  09/25/2012   *RADIOLOGY REPORT*  Clinical Data: Status post dialysis catheter placement  PORTABLE CHEST - 1 VIEW  Comparison: 04/24/2008  Findings: There is a right chest wall dialysis catheter with tips in the distal SVC and cavoatrial junction. No pneumothorax identified.  Normal heart size.  No pleural effusion or edema.  No airspace consolidation identified.  IMPRESSION:  1.  No complications status post dialysis catheter placement.   Original Report Authenticated By: Kerby Moors, M.D.   Dg Fluoro Guide Cv Line-no Report  09/25/2012   CLINICAL DATA: placement dialysis catheter   FLOURO GUIDE CV LINE  Fluoroscopy was utilized by the requesting physician.  No radiographic  interpretation.       Medications:    Infusions: . sodium chloride 50 mL/hr at 09/25/12 1250  .  sodium bicarbonate  infusion 1000 mL 100 mL/hr at 09/25/12 0151    Scheduled Medications: . levothyroxine  150 mcg Oral QAC breakfast  . multivitamin  1 tablet Oral Daily  . potassium chloride  40 mEq Oral TID  . sevelamer carbonate  4.8 g Oral TID WC      Assessment/ Plan:    Pt is a 72 y.o. yo female with a PMHx of HTN, diabetes in remission, hypothyroidism, anemia, cancer of right breast followed by oncology, who was admitted on 09/24/2012 for a noted rapid raising of her creatinine level during outpatient labs. Renal service is consulted for further of the cause of this and further management.  # Acute Renal Injury: Urine improved after HD on 7/2. BUN 166>58. Cr 11.73 > 5.79. Hypokalemia 2.8 >3.1. Anion gap metabolic acidosis 17 [id number] with HD. Good urine ou put. Etiology still unclear. Urinalysis unrevealing. Renal ultrasound revealed a small left non-obstructive stone without hydronephrosis. Kidneys have increased echogenicity bilaterally. Other labs including ASO, complement levels, hepatitis panel are negative. ATN, obstruction from retroperitoneal fibrosis, and infiltrative  renal disease (e.g lymphocytosis and plasmacytosis) are all part of the differential. Elevated urine free light chains which favour infiltrative renal disease .GN is unlikely with bland urinalysis. Likely has chronic renal disease as well which has been indolent.  Plan  - continue with bicarb drip - trend BMET - SPEP - Strict ins/out  - daily weights- patient appears euvolemic - awaiting abdominal/pelvis CT without contrast  - Again may ultimately require renal bx   # Bone Metabolism: PTH elevated at 219. Phos 8.6 >3.6. Cal 6.8 >  7.2. No vitamin D level.  Plan  - consider vitamin D level  - continue with Renvela    # Hypokalemia: K 2.8 > 3.3. Likely related to AKI with K redistribution following Bicarb infusion overnight. No evidence of GI or renal losses. Mg is slightly high at 2.7.  Plan  - replacement with IV KCL  - trend BMET.   #Hypochronic Normocytic Anemia: Hgb 6.7 > 7.9 after transfusion. Normal baseline in 07/2011. Iron/anemia panel reveals iron level of 228 and UIBC <15. She has not responded to outpatient Darbepoetin. She has no symptoms. Plan  -check FOBT  #Thrombocytopenia: Plt 136>122 >113. Normal baseline. Likely related to OTC medication. No active bleeding. Will continue to follow  # Hypertension: BP currently stable. Hold home amlodipine. Will continue to monitor.  #Hypothyroidism: TSH 0.163. Continue with Synthroid.    Length of Stay: 2 days  Patient history and plan of care reviewed with attending, Dr. Jimmy Footman who will also cosign my note.  Signed:  Jessee Avers, MD PGY-2 Internal Medicine Teaching Service Pager: [phone] 09/26/2012, 8:30 AM   I have seen and examined this patient and agree with the plan of care seen, examined and counseled patient and family.  Discussed with resident.  Less uremic and chem better after HD.Marland Kitchen CT Pending, and will probably need bx.  ^ Free LC .  Issiac Jamar L 09/26/2012, 11:32 AM

## 2012-09-26 NOTE — Progress Notes (Signed)
Asking questions about renal diet. Information sheet given and reviewed briefly. Dietician consult requested. She has an aversion to "white food" as she was told it affects her diabetes. She does not check her blood sugars at home on a regular basis. Johnatan Baskette, Lincoln Brigham, RN

## 2012-09-27 DIAGNOSIS — R799 Abnormal finding of blood chemistry, unspecified: Secondary | ICD-10-CM

## 2012-09-27 DIAGNOSIS — I10 Essential (primary) hypertension: Secondary | ICD-10-CM

## 2012-09-27 LAB — CBC
HCT: 22.3 % — ABNORMAL LOW (ref 36.0–46.0)
Hemoglobin: 7.9 g/dL — ABNORMAL LOW (ref 12.0–15.0)
MCH: 31.2 pg (ref 26.0–34.0)
MCHC: 35.4 g/dL (ref 30.0–36.0)
RBC: 2.53 MIL/uL — ABNORMAL LOW (ref 3.87–5.11)

## 2012-09-27 LAB — BASIC METABOLIC PANEL
BUN: 60 mg/dL — ABNORMAL HIGH (ref 6–23)
CO2: 26 mEq/L (ref 19–32)
Chloride: 103 mEq/L (ref 96–112)
Glucose, Bld: 110 mg/dL — ABNORMAL HIGH (ref 70–99)
Potassium: 3.5 mEq/L (ref 3.5–5.1)

## 2012-09-27 LAB — MPO/PR-3 (ANCA) ANTIBODIES
Myeloperoxidase Abs: <1 AI (ref <1.0)
Serine Protease 3: <1 AI (ref <1.0)

## 2012-09-27 NOTE — Progress Notes (Signed)
Patient ID: Anita Pollard  female  [id number]    DOB: 02/26/1941    DOA: 09/24/2012  PCP: Lawerance Cruel, MD  Assessment/Plan:   Acute kidney injury - unclear etiology, with uremia, metabolic acidosis, hypokalemia.   - renal consulted, workup in progress, dialysis catheter placed, started on HD 09/25/12 x 1. -  Cr again rising today - Renal ultrasound showed increased echogenicity bilaterally, small left nonobstructive stone without  Hydronephrosis - ASO, hepatitis panel are negative, C3 slightly low - Elevated urine free light chains which favour infiltrative renal disease, will likely need renal biopsy for diagnosis, defer to renal service. 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   Hypertension  - Currently stable  Hypothyroidism  -TSH 0.163, reduced home Synthroid dose to 139mcg  Diabetes mellitus2  -hemoglobin A1c 6.8, NovoLog sliding-scale   Metabolic acidosis - improved, bicarbonate drip dc'd   history of Right breast cancer  -Dr. Jana Hakim is oncologist, in remission   Normocytic anemia -Suspect due to renal disease, repeat anemia panel also shows iron level of 188, ferritin 196, TIBC 214 -  Hemoglobin 7.9, FOBT pending, s/p 1 unit packed RBC on 7/2  DVT Prophylaxis:SCDs  Code Status:  Disposition:    Subjective: denies any specific complaints, feels okay  Objective: Weight change: -0.57 kg (-1 lb 4.1 oz)  Intake/Output Summary (Last 24 hours) at 09/27/12 1002 Last data filed at 09/27/12 0800  Gross per 24 hour  Intake   1320 ml  Output   1814 ml  Net   -494 ml   Blood pressure 157/51, pulse 59, temperature 99 F (37.2 C), temperature source Oral, resp. rate 18, height 5\' 1"  (1.549 m), weight 59.33 kg (130 lb 12.8 oz), SpO2 95.00%.  Physical Exam: General: Ax O x3, NAD CVS: S1-S2 clear, no mrg Chest: CTAB, no wheezing, rales  or rhonchi Abdomen: soft NT, ND, NBS  Extremities: no c/c/e bilaterally   Lab Results: Basic Metabolic Panel:  Recent Labs Lab 09/24/12 1359  09/26/12 0610 09/26/12 1710 09/27/12 0552  NA  --   < > 142 138 139  K  --   < > 3.3* 3.5 3.5  CL  --   < > 105 100 103  CO2  --   < > 23 27 26   GLUCOSE  --   < > 129* 116* 110*  BUN  --   < > 58* 55* 60*  CREATININE  --   < > 5.79* 5.88* 6.44*  CALCIUM  --   < > 7.2* 7.3* 7.4*  MG 2.7*  --   --   --   --   PHOS  --   < > 3.6  --   --   < > = values in this interval not displayed. Liver Function Tests:  Recent Labs Lab 09/24/12 1052 09/25/12 0639 09/26/12 0610  AST 5 <5  --   ALT 15 12  --   ALKPHOS 49 44  --   BILITOT 0.3 0.3  --   PROT 6.4 5.6*  --   ALBUMIN 3.6 3.1* 3.0*   No results found for this basename: LIPASE, AMYLASE,  in the last 168 hours No results found for this basename: AMMONIA,  in the last 168 hours CBC:  Recent Labs Lab 09/24/12 1052  09/26/12 0610 09/27/12 0552  WBC 6.1  < > 3.9* 5.0  NEUTROABS 4.2  --   --   --  HGB 8.1*  < > 7.9* 7.9*  HCT 22.1*  < > 21.8* 22.3*  MCV 86.0  < > 84.8 88.1  PLT 136*  < > 113* 108*  < > = values in this interval not displayed. Cardiac Enzymes:  Recent Labs Lab 09/24/12 1359  CKTOTAL 148   BNP: No components found with this basename: POCBNP,  CBG:  Recent Labs Lab 09/25/12 1401  GLUCAP 138*     Micro Results: No results found for this or any previous visit (from the past 240 hour(s)).  Studies/Results: US Renal  09/24/2012   *RADIOLOGY REPORT*  Clinical Data: Elevated creatinine  RENAL/URINARY TRACT ULTRASOUND COMPLETE  Comparison:  09/02/2012  Findings:  Right Kidney:  10.5 cm in length.  The kidney is extremely echogenic consistent with medical renal disease and stable from prior exam.  Left Kidney:  10.8 cm in length.  Increased echogenicity is noted similar to that seen on prior study.  A tiny 6 mm cyst is noted. Echogenic focus in the lower pole  of the left kidney consistent with a nonobstructing stone.  This is stable from prior exam.  Bladder:  Partially decompressed  IMPRESSION: No mass lesion or hydronephrosis is noted.  Increased echogenicity is identified consistent with medical renal disease.  Stable nonobstructing left renal stone   Original Report Authenticated By: Inez Catalina, M.D.   US Renal  09/02/2012   *RADIOLOGY REPORT*  Clinical Data: Acute renal failure  RENAL/URINARY TRACT ULTRASOUND COMPLETE  Comparison:  CT virtual colonoscopy of 12/02/2010  Findings:  Right Kidney:  No hydronephrosis is seen.  The right kidney measures 10.8 cm sagittally.  However, the parenchyma of the right kidney is markedly echogenic consistent with chronic renal medical disease.  Left Kidney:  No hydronephrosis is noted.  The left kidney measures 10.4 cm.  The parenchyma also is very echogenic consistent with chronic renal medical disease.  Probable nonshadowing calculi are noted.  A lower pole cyst is present of 0.6 mm in diameter.  Bladder:  The urinary bladder is not distended cannot be well evaluated.  IMPRESSION:  1.  No hydronephrosis. 2.  Very echogenic renal parenchyma bilaterally consistent with chronic renal medical disease. 3.  Echogenic foci in the left kidney consistent with calculi which are not obstructing.   Original Report Authenticated By: Ivar Drape, M.D.   Mm Digital Diagnostic Bilat  08/29/2012   *RADIOLOGY REPORT*  Clinical Data:  Malignant lumpectomy of the right breast in June, 2010.  Annual evaluation.  DIGITAL DIAGNOSTIC BILATERAL MAMMOGRAM WITH CAD  Comparison: 08/28/2011, 05/07 03/2010, dating back to 07/28/2008.  Findings:  ACR Breast Density Category 3: The breast tissue is heterogeneously dense.  CC and MLO views of both breasts and a spot tangential view of the right breast at the lumpectomy site were obtained.  Post lumpectomy scarring in the retroareolar right breast.  There are no findings suspicious for malignancy.   Mammographic images were processed with CAD.  IMPRESSION: No specific mammographic evidence of malignancy.  Post lumpectomy scarring in the retroareolar right breast.  RECOMMENDATION: Bilateral diagnostic mammography in 1 year.  The patient was encouraged to perform monthly self breast examination and communicate any changes with her primary physician. I have discussed the findings and recommendations with the patient. Results were also provided in writing at the conclusion of the visit.  If applicable, a reminder letter will be sent to the patient regarding her next appointment.  BI-RADS CATEGORY 2:  Benign finding(s).   Original Report Authenticated  By: Evangeline Dakin, M.D.    Medications: Scheduled Meds: . levothyroxine  125 mcg Oral QAC breakfast  . multivitamin  1 tablet Oral Daily  . potassium chloride  40 mEq Oral Once  . sevelamer carbonate  4.8 g Oral TID WC  . sodium chloride  3 mL Intravenous Q12H      LOS: 3 days   Leverne Amrhein M.D. [facility] 09/27/2012, 10:02 AM Pager: [phone]  If 7PM-7AM, please contact night-coverage [email or web address] Password [email or web address]

## 2012-09-27 NOTE — Progress Notes (Signed)
Subjective: Interval History: has complaints tired of being here..  Objective: Vital signs in last 24 hours: Temp:  [98 F (36.7 C)-99.2 F (37.3 C)] 99 F (37.2 C) (07/04 0458) Pulse Rate:  [59-77] 59 (07/04 0458) Resp:  [18] 18 (07/04 0458) BP: (143-165)/(51-88) 157/51 mmHg (07/04 0458) SpO2:  [95 %-97 %] 95 % (07/04 0458) Weight:  [59.33 kg (130 lb 12.8 oz)] 59.33 kg (130 lb 12.8 oz) (07/03 2042) Weight change: -0.57 kg (-1 lb 4.1 oz)  Intake/Output from previous day: 07/03 0701 - 07/04 0700 In: 1320 [P.O.:600; I.V.:720] Out: 1754 [Urine:1750; Stool:4] Intake/Output this shift: Total I/O In: 3 [I.V.:3] Out: 60 [Urine:60]  General appearance: alert, cooperative and pale Resp: diminished breath sounds bilaterally Cardio: S1, S2 normal and systolic murmur: holosystolic 2/6, blowing at apex GI: pos bs, liver down 3 cm Extremities: extremities normal, atraumatic, no cyanosis or edema  Lab Results:  Recent Labs  09/26/12 0610 09/27/12 0552  WBC 3.9* 5.0  HGB 7.9* 7.9*  HCT 21.8* 22.3*  PLT 113* 108*   BMET:  Recent Labs  09/26/12 1710 09/27/12 0552  NA 138 139  K 3.5 3.5  CL 100 103  CO2 27 26  GLUCOSE 116* 110*  BUN 55* 60*  CREATININE 5.88* 6.44*  CALCIUM 7.3* 7.4*    Recent Labs  09/24/12 1359  PTH 219.2*   Iron Studies:  Recent Labs  09/25/12 0639  IRON 188*  TIBC 214*  FERRITIN 196    Studies/Results: Ct Abdomen Pelvis Wo Contrast  09/26/2012   *RADIOLOGY REPORT*  Clinical Data: Acute renal insufficiency.  Question retroperitoneal obstruction.  CT ABDOMEN AND PELVIS WITHOUT CONTRAST  Technique:  Multidetector CT imaging of the abdomen and pelvis was performed following the standard protocol without intravenous contrast.  Comparison: 12/02/2010.  Findings: Lung bases show no acute findings.  Heart size normal. No pericardial or pleural effusion.  Liver, gallbladder, and right adrenal gland are unremarkable.  Left adrenal gland appears minimally  prominent, as before, stable. Stones are seen in the kidneys bilaterally, measuring up to 11 mm on the left.  Ureters are decompressed.  Spleen is unremarkable. Calcifications are seen in an atrophic pancreas.  No ductal dilatation.  Stomach and bowel are unremarkable.  Foley catheter is seen in a decompressed bladder.  Hysterectomy. Ovaries are visualized.  No free fluid.  Atherosclerotic calcification of the arterial vasculature without abdominal aortic aneurysm.  No pathologically enlarged lymph nodes. No worrisome lytic or sclerotic lesions.  Right hip arthroplasty.  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  IMPRESSION:  1.  Bilateral renal stones without obstruction. 2.  Mild prominence of the left adrenal gland, stable. 3.  Chronic calcific pancreatitis. 4.  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   Original Report Authenticated By: Lorin Picket, M.D.   Dg Chest Port 1 View  09/25/2012   *RADIOLOGY REPORT*  Clinical Data: Status post dialysis catheter placement  PORTABLE CHEST - 1 VIEW  Comparison: 04/24/2008  Findings: There is a right chest wall dialysis catheter with tips in the distal SVC and cavoatrial junction. No pneumothorax identified.  Normal heart size.  No pleural effusion or edema.  No airspace consolidation identified.  IMPRESSION:  1.  No complications status post dialysis catheter placement.   Original Report Authenticated By: Kerby Moors, M.D.   Dg Fluoro Guide Cv Line-no Report  09/25/2012   CLINICAL DATA: placement dialysis catheter   FLOURO GUIDE CV LINE  Fluoroscopy was  utilized by the requesting physician.  No radiographic  interpretation.     I have reviewed the patient's current medications.  Assessment/Plan: 1 AKI cr rising , antic HD in am.  Cause will need to be determined with renal bx. Will do on Mon. 2 Anemia stable 3 Low Ptlt  Follow 4 HTN controlled P HD, follow ptlt, bx.    LOS: 3 days   Nel Stoneking  L 09/27/2012,10:52 AM

## 2012-09-27 NOTE — Progress Notes (Signed)
Nutrition Education Note  RD consulted for Renal Education. Provided Choose-A-Meal Booklet to patient/family. Reviewed food groups and provided written recommended serving sizes specifically determined for patient's current nutritional status.   Explained why diet restrictions are needed and provided lists of foods to limit/avoid that are high potassium, sodium, and phosphorus. Provided specific recommendations on safer alternatives of these foods. Strongly encouraged compliance of this diet.   Discussed importance of protein intake at each meal and snack. Provided examples of how to maximize protein intake throughout the day. Discussed need for fluid restriction with dialysis, importance of minimizing weight gain between HD treatments, and renal-friendly beverage options. Teach back method used.  Discussed difference in diet restrictions for dialysis-dependent patient and AKI patient. Instructed pt to keep up with her phosphorus and potassium labs when d/c and if she does not require HD long-term, will only need to follow restrictions if labs are elevated. Pt and family agreed.  Pt appears extremely frustrated with her hospitalization. RD attempted to provide emotional support as able. Also reviewed "Carbohydrate Counting Nutrition Therapy" with her.  Expect good compliance.  Body mass index is 24.73 kg/(m^2). Pt meets criteria for WNL based on current BMI.  Current diet order is Renal 80-90. Labs and medications reviewed. No further nutrition interventions warranted at this time. RD contact information provided. If additional nutrition issues arise, please re-consult RD.  Inda Coke MS, RD, LDN Pager: [phone] After-hours pager: [phone]

## 2012-09-28 LAB — CBC
HCT: 23.5 % — ABNORMAL LOW (ref 36.0–46.0)
MCH: 31.3 pg (ref 26.0–34.0)
MCV: 89.7 fL (ref 78.0–100.0)
Platelets: 110 10*3/uL — ABNORMAL LOW (ref 150–400)
RBC: 2.62 MIL/uL — ABNORMAL LOW (ref 3.87–5.11)
RDW: 12 % (ref 11.5–15.5)

## 2012-09-28 LAB — COMPREHENSIVE METABOLIC PANEL
Albumin: 3 g/dL — ABNORMAL LOW (ref 3.5–5.2)
Alkaline Phosphatase: 40 U/L (ref 39–117)
BUN: 65 mg/dL — ABNORMAL HIGH (ref 6–23)
Chloride: 98 mEq/L (ref 96–112)
Creatinine, Ser: 7.21 mg/dL — ABNORMAL HIGH (ref 0.50–1.10)
GFR calc Af Amer: 6 mL/min — ABNORMAL LOW (ref >90)
Glucose, Bld: 107 mg/dL — ABNORMAL HIGH (ref 70–99)
Total Bilirubin: 0.3 mg/dL (ref 0.3–1.2)

## 2012-09-28 LAB — GLUCOSE, CAPILLARY: Glucose-Capillary: 134 mg/dL — ABNORMAL HIGH (ref 70–99)

## 2012-09-28 MED ORDER — INSULIN ASPART 100 UNIT/ML ~~LOC~~ SOLN
0.0000 [IU] | Freq: Three times a day (TID) | SUBCUTANEOUS | Status: DC
  Administered 2012-09-28: 1 [IU] via SUBCUTANEOUS
  Administered 2012-10-01: 2 [IU] via SUBCUTANEOUS
  Administered 2012-10-02 – 2012-10-05 (×5): 1 [IU] via SUBCUTANEOUS
  Administered 2012-10-06: 2 [IU] via SUBCUTANEOUS
  Administered 2012-10-06 – 2012-10-08 (×3): 1 [IU] via SUBCUTANEOUS

## 2012-09-28 MED ORDER — POTASSIUM CHLORIDE CRYS ER 20 MEQ PO TBCR
40.0000 meq | EXTENDED_RELEASE_TABLET | Freq: Once | ORAL | Status: AC
  Administered 2012-09-28: 40 meq via ORAL
  Filled 2012-09-28: qty 2

## 2012-09-28 MED ORDER — LORAZEPAM 0.5 MG PO TABS
0.5000 mg | ORAL_TABLET | Freq: Once | ORAL | Status: AC
  Administered 2012-09-29: 0.5 mg via ORAL
  Filled 2012-09-28: qty 1

## 2012-09-28 MED ORDER — INSULIN ASPART 100 UNIT/ML ~~LOC~~ SOLN: 0.0000 [IU] | Freq: Every day | SUBCUTANEOUS | Status: DC

## 2012-09-28 MED ORDER — HYDROCORTISONE ACETATE 25 MG RE SUPP
25.0000 mg | Freq: Two times a day (BID) | RECTAL | Status: DC
  Filled 2012-09-28 (×4): qty 1

## 2012-09-28 NOTE — Progress Notes (Addendum)
Per pt, complaining of SOB which started after hemodialysis. Pt asymptomatic at this time, no respiratory distress or pain noted. Recent vital signs are O2 96% 153/51 HR 60.    M. Lynch NP paged. No new orders at this time. Per NP, continue to monitor. Velora Mediate

## 2012-09-28 NOTE — Progress Notes (Signed)
Patient ID: Anita Pollard  female  [id number]    DOB: 02/14/1941    DOA: 09/24/2012  PCP: Lawerance Cruel, MD  Assessment/Plan:   Acute kidney injury - unclear etiology, with uremia, metabolic acidosis, hypokalemia.   - renal consulted, workup in progress, dialysis catheter placed, started on HD 09/25/12 x 1. -  Cr still rising, d/w renal, HD again today - Renal ultrasound showed increased echogenicity bilaterally, small left nonobstructive stone without  Hydronephrosis - ASO, hepatitis panel are negative, C3 slightly low - Elevated urine free light chains which favour infiltrative renal disease, renal biopsy on Monday  - CT abdomen and pelvis showed bilateral renal stones without obstruction, chronic calcific pancreatitis, mild changes of avascular necrosis in the left femoral head.   Hypertension  - Currently stable  Hypothyroidism  -TSH 0.163, reduced home Synthroid dose to 153mcg  Diabetes mellitus2 : Diet controlled at home -hemoglobin A1c 6.8 in 09/25/12, - Placed on NovoLog sliding-scale qac and hs  Metabolic acidosis - improved, bicarbonate drip dc'd   history of Right breast cancer  -Dr. Jana Hakim is oncologist, in remission   Normocytic anemia -Suspect due to renal disease, repeat anemia panel also shows iron level of 188, ferritin 196, TIBC 214 -  Hemoglobin 7.9, FOBT negative, s/p 1 unit packed RBC on 7/2  History of hemorrhoids: placed on hydrocortisone supp per request from patient  DVT Prophylaxis:SCDs  Code Status:  Disposition:    Subjective: Did not sleep well, feeling nausea. Worried about son's wedding at [facility] and how she will manage with new HD  Objective: Weight change: 1.225 kg (2 lb 11.2 oz)  Intake/Output Summary (Last 24 hours) at 09/28/12 1038 Last data filed at 09/28/12 0800  Gross per 24 hour  Intake    893 ml  Output   1676 ml  Net   -783 ml   Blood pressure 156/70, pulse 64, temperature 98.6 F (37 C), temperature  source Oral, resp. rate 18, height 5\' 1"  (1.549 m), weight 60.555 kg (133 lb 8 oz), SpO2 99.00%.  Physical Exam: General: Ax O x3, NAD CVS: S1-S2 clear, no mrg Chest: CTAB, no wheezing, rales or rhonchi Abdomen: soft NT, ND, NBS  Extremities: no c/c/e bilaterally   Lab Results: Basic Metabolic Panel:  Recent Labs Lab 09/24/12 1359  09/27/12 0552 09/28/12 0350  NA  --   < > 139 137  K  --   < > 3.5 3.3*  CL  --   < > 103 98  CO2  --   < > 26 27  GLUCOSE  --   < > 110* 107*  BUN  --   < > 60* 65*  CREATININE  --   < > 6.44* 7.21*  CALCIUM  --   < > 7.4* 7.7*  MG 2.7*  --   --   --   PHOS  --   < >  --  3.7  < > = values in this interval not displayed. Liver Function Tests:  Recent Labs Lab 09/25/12 0639 09/26/12 0610 09/28/12 0350  AST <5  --  13  ALT 12  --  <5  ALKPHOS 44  --  40  BILITOT 0.3  --  0.3  PROT 5.6*  --  5.5*  ALBUMIN 3.1* 3.0* 3.0*   No results found for this basename: LIPASE, AMYLASE,  in the last 168 hours No results found for this basename: AMMONIA,  in the last 168 hours CBC:  Recent  Labs Lab 09/24/12 1052  09/27/12 0552 09/28/12 0350  WBC 6.1  < > 5.0 6.8  NEUTROABS 4.2  --   --   --   HGB 8.1*  < > 7.9* 8.2*  HCT 22.1*  < > 22.3* 23.5*  MCV 86.0  < > 88.1 89.7  PLT 136*  < > 108* 110*  < > = values in this interval not displayed. Cardiac Enzymes:  Recent Labs Lab 09/24/12 1359  CKTOTAL 148   BNP: No components found with this basename: POCBNP,  CBG:  Recent Labs Lab 09/25/12 1401  GLUCAP 138*     Micro Results: No results found for this or any previous visit (from the past 240 hour(s)).  Studies/Results: US Renal  09/24/2012   *RADIOLOGY REPORT*  Clinical Data: Elevated creatinine  RENAL/URINARY TRACT ULTRASOUND COMPLETE  Comparison:  09/02/2012  Findings:  Right Kidney:  10.5 cm in length.  The kidney is extremely echogenic consistent with medical renal disease and stable from prior exam.  Left Kidney:  10.8 cm in  length.  Increased echogenicity is noted similar to that seen on prior study.  A tiny 6 mm cyst is noted. Echogenic focus in the lower pole of the left kidney consistent with a nonobstructing stone.  This is stable from prior exam.  Bladder:  Partially decompressed  IMPRESSION: No mass lesion or hydronephrosis is noted.  Increased echogenicity is identified consistent with medical renal disease.  Stable nonobstructing left renal stone   Original Report Authenticated By: Inez Catalina, M.D.   US Renal  09/02/2012   *RADIOLOGY REPORT*  Clinical Data: Acute renal failure  RENAL/URINARY TRACT ULTRASOUND COMPLETE  Comparison:  CT virtual colonoscopy of 12/02/2010  Findings:  Right Kidney:  No hydronephrosis is seen.  The right kidney measures 10.8 cm sagittally.  However, the parenchyma of the right kidney is markedly echogenic consistent with chronic renal medical disease.  Left Kidney:  No hydronephrosis is noted.  The left kidney measures 10.4 cm.  The parenchyma also is very echogenic consistent with chronic renal medical disease.  Probable nonshadowing calculi are noted.  A lower pole cyst is present of 0.6 mm in diameter.  Bladder:  The urinary bladder is not distended cannot be well evaluated.  IMPRESSION:  1.  No hydronephrosis. 2.  Very echogenic renal parenchyma bilaterally consistent with chronic renal medical disease. 3.  Echogenic foci in the left kidney consistent with calculi which are not obstructing.   Original Report Authenticated By: Ivar Drape, M.D.   Mm Digital Diagnostic Bilat  08/29/2012   *RADIOLOGY REPORT*  Clinical Data:  Malignant lumpectomy of the right breast in June, 2010.  Annual evaluation.  DIGITAL DIAGNOSTIC BILATERAL MAMMOGRAM WITH CAD  Comparison: 08/28/2011, 05/07 03/2010, dating back to 07/28/2008.  Findings:  ACR Breast Density Category 3: The breast tissue is heterogeneously dense.  CC and MLO views of both breasts and a spot tangential view of the right breast at the lumpectomy  site were obtained.  Post lumpectomy scarring in the retroareolar right breast.  There are no findings suspicious for malignancy.  Mammographic images were processed with CAD.  IMPRESSION: No specific mammographic evidence of malignancy.  Post lumpectomy scarring in the retroareolar right breast.  RECOMMENDATION: Bilateral diagnostic mammography in 1 year.  The patient was encouraged to perform monthly self breast examination and communicate any changes with her primary physician. I have discussed the findings and recommendations with the patient. Results were also provided in writing at the conclusion of  the visit.  If applicable, a reminder letter will be sent to the patient regarding her next appointment.  BI-RADS CATEGORY 2:  Benign finding(s).   Original Report Authenticated By: Evangeline Dakin, M.D.    Medications: Scheduled Meds: . hydrocortisone  25 mg Rectal BID  . levothyroxine  125 mcg Oral QAC breakfast  . multivitamin  1 tablet Oral Daily  . sevelamer carbonate  4.8 g Oral TID WC  . sodium chloride  3 mL Intravenous Q12H      LOS: 4 days   RAI,RIPUDEEP M.D. [facility] 09/28/2012, 10:38 AM Pager: [phone]  If 7PM-7AM, please contact night-coverage [email or web address] Password [email or web address]

## 2012-09-28 NOTE — Progress Notes (Signed)
Subjective: Interval History: No complaints.  Objective: Vital signs in last 24 hours: Temp:  [97.6 F (36.4 C)-98.7 F (37.1 C)] 98.7 F (37.1 C) (07/05 0410) Pulse Rate:  [60-66] 66 (07/05 0410) Resp:  [16-18] 18 (07/05 0410) BP: (155-178)/(48-74) 157/48 mmHg (07/05 0410) SpO2:  [95 %-98 %] 96 % (07/05 0410) Weight:  [133 lb 8 oz (60.555 kg)] 133 lb 8 oz (60.555 kg) (07/04 2042) Weight change: 2 lb 11.2 oz (1.225 kg)  Intake/Output from previous day: 07/04 0701 - 07/05 0700 In: 893 [P.O.:890; I.V.:3] Out: 1311 [Urine:1310; Stool:1] Intake/Output this shift: Total I/O In: 240 [P.O.:240] Out: 425 [Urine:425]  General appearance: alert, cooperative and pale Resp: diminished breath sounds bilaterally Cardio: S1, S2 normal and systolic murmur: holosystolic 2/6, blowing at apex GI: pos bs, S, NT, ND Extremities: extremities normal, atraumatic, no cyanosis or edema, 2+ pulses in BU&LE  Lab Results:  Recent Labs  09/27/12 0552 09/28/12 0350  WBC 5.0 6.8  HGB 7.9* 8.2*  HCT 22.3* 23.5*  PLT 108* 110*   BMET:   Recent Labs  09/27/12 0552 09/28/12 0350  NA 139 137  K 3.5 3.3*  CL 103 98  CO2 26 27  GLUCOSE 110* 107*  BUN 60* 65*  CREATININE 6.44* 7.21*  CALCIUM 7.4* 7.7*   No results found for this basename: PTH,  in the last 72 hours  Iron Studies: No results found for this basename: IRON, TIBC, TRANSFERRIN, FERRITIN,  in the last 72 hours  Studies/Results: Ct Abdomen Pelvis Wo Contrast  09/26/2012   *RADIOLOGY REPORT*  Clinical Data: Acute renal insufficiency.  Question retroperitoneal obstruction.  CT ABDOMEN AND PELVIS WITHOUT CONTRAST  Technique:  Multidetector CT imaging of the abdomen and pelvis was performed following the standard protocol without intravenous contrast.  Comparison: 12/02/2010.  Findings: Lung bases show no acute findings.  Heart size normal. No pericardial or pleural effusion.  Liver, gallbladder, and right adrenal gland are unremarkable.   Left adrenal gland appears minimally prominent, as before, stable. Stones are seen in the kidneys bilaterally, measuring up to 11 mm on the left.  Ureters are decompressed.  Spleen is unremarkable. Calcifications are seen in an atrophic pancreas.  No ductal dilatation.  Stomach and bowel are unremarkable.  Foley catheter is seen in a decompressed bladder.  Hysterectomy. Ovaries are visualized.  No free fluid.  Atherosclerotic calcification of the arterial vasculature without abdominal aortic aneurysm.  No pathologically enlarged lymph nodes. No worrisome lytic or sclerotic lesions.  Right hip arthroplasty.  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  IMPRESSION:  1.  Bilateral renal stones without obstruction. 2.  Mild prominence of the left adrenal gland, stable. 3.  Chronic calcific pancreatitis. 4.  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   Original Report Authenticated By: Lorin Picket, M.D.    I have reviewed the patient's current medications.  Assessment/Plan: 1 AKI/CKD Cr rising, HD today. ?Infiltrative, elevated free light chains- cause will need to be determined with renal bx on Mon. 2 Anemia stable. FOBT negative 3 Low platelets, stable 4 Hypokalemia K 3.3. Replacing 5 HTN controlled P HD, monitor CBC, replacing K, bx Monday    LOS: 4 days   Otho Bellows 09/28/2012,9:06 AM I have seen and examined this patient and agree with the plan of care seen, examined and evaluated.  Discussed with resident and Dr. Tana Coast.  Will do HD today as little GFR and anticipate renal biopsy on Mon. Marland Kitchen  Lolly Glaus L 09/28/2012, 10:26 AM

## 2012-09-29 ENCOUNTER — Inpatient Hospital Stay (HOSPITAL_COMMUNITY): Payer: Medicare Other

## 2012-09-29 LAB — BASIC METABOLIC PANEL
BUN: 33 mg/dL — ABNORMAL HIGH (ref 6–23)
Chloride: 103 mEq/L (ref 96–112)
Glucose, Bld: 102 mg/dL — ABNORMAL HIGH (ref 70–99)
Potassium: 4 mEq/L (ref 3.5–5.1)

## 2012-09-29 LAB — GLUCOSE, CAPILLARY
Glucose-Capillary: 96 mg/dL (ref 70–99)
Glucose-Capillary: 102 mg/dL — ABNORMAL HIGH (ref 70–99)
Glucose-Capillary: 98 mg/dL (ref 70–99)
Glucose-Capillary: 130 mg/dL — ABNORMAL HIGH (ref 70–99)

## 2012-09-29 MED ORDER — LORAZEPAM 0.5 MG PO TABS
0.5000 mg | ORAL_TABLET | Freq: Every evening | ORAL | Status: DC | PRN
  Administered 2012-09-29 – 2012-10-06 (×4): 0.5 mg via ORAL
  Filled 2012-09-29 (×4): qty 1

## 2012-09-29 MED ORDER — LIDOCAINE HCL (PF) 1 % IJ SOLN: 5.0000 mL | INTRAMUSCULAR | Status: DC | PRN

## 2012-09-29 MED ORDER — PANTOPRAZOLE SODIUM 40 MG PO TBEC
40.0000 mg | DELAYED_RELEASE_TABLET | Freq: Every day | ORAL | Status: DC
  Administered 2012-09-29 – 2012-10-08 (×10): 40 mg via ORAL
  Filled 2012-09-29 (×9): qty 1

## 2012-09-29 MED ORDER — SODIUM CHLORIDE 0.9 % IV SOLN: 100.0000 mL | INTRAVENOUS | Status: DC | PRN

## 2012-09-29 MED ORDER — ALTEPLASE 2 MG IJ SOLR: 2.0000 mg | Freq: Once | INTRAMUSCULAR | Status: AC | PRN

## 2012-09-29 MED ORDER — SODIUM CHLORIDE 0.9 % IV SOLN
INTRAVENOUS | Status: DC
  Administered 2012-09-30: 06:00:00 via INTRAVENOUS

## 2012-09-29 MED ORDER — NEPRO/CARBSTEADY PO LIQD: 237.0000 mL | ORAL | Status: DC | PRN

## 2012-09-29 MED ORDER — SODIUM CHLORIDE 0.9 % IV SOLN
20.0000 ug | Freq: Once | INTRAVENOUS | Status: AC
  Administered 2012-09-30: 20 ug via INTRAVENOUS
  Filled 2012-09-29 (×2): qty 5

## 2012-09-29 MED ORDER — FUROSEMIDE 80 MG PO TABS
160.0000 mg | ORAL_TABLET | Freq: Every day | ORAL | Status: AC
  Administered 2012-09-29: 160 mg via ORAL
  Filled 2012-09-29: qty 2

## 2012-09-29 MED ORDER — LORAZEPAM 1 MG PO TABS: 4.0000 mg | ORAL_TABLET | Freq: Once | ORAL | Status: DC

## 2012-09-29 MED ORDER — LIDOCAINE-PRILOCAINE 2.5-2.5 % EX CREA: 1.0000 "application " | TOPICAL_CREAM | CUTANEOUS | Status: DC | PRN

## 2012-09-29 MED ORDER — PENTAFLUOROPROP-TETRAFLUOROETH EX AERO: 1.0000 "application " | INHALATION_SPRAY | CUTANEOUS | Status: DC | PRN

## 2012-09-29 MED ORDER — LEVOTHYROXINE SODIUM 125 MCG PO TABS
125.0000 ug | ORAL_TABLET | Freq: Every day | ORAL | Status: DC
  Administered 2012-09-30 – 2012-10-08 (×10): 125 ug via ORAL
  Filled 2012-09-29 (×12): qty 1

## 2012-09-29 MED ORDER — HEPARIN SODIUM (PORCINE) 1000 UNIT/ML DIALYSIS: 1000.0000 [IU] | INTRAMUSCULAR | Status: DC | PRN

## 2012-09-29 NOTE — Progress Notes (Addendum)
Subjective: Interval History: No complaints. HD yesterday, some SOB with dialysis.  Objective: Vital signs in last 24 hours: Temp:  [98.3 F (36.8 C)-98.9 F (37.2 C)] 98.4 F (36.9 C) (07/06 0450) Pulse Rate:  [62-82] 82 (07/06 0450) Resp:  [16-20] 18 (07/06 0450) BP: (135-176)/(50-70) 137/68 mmHg (07/06 0450) SpO2:  [93 %-99 %] 93 % (07/06 0450) Weight:  [130 lb 11.7 oz (59.3 kg)-130 lb 15.3 oz (59.4 kg)] 130 lb 11.7 oz (59.3 kg) (07/05 2149) Weight change: -2 lb 8.8 oz (-1.155 kg)  Intake/Output from previous day: 07/05 0701 - 07/06 0700 In: 240 [P.O.:240] Out: 1475 [Urine:1475] Intake/Output this shift:    General appearance: alert, cooperative and pale Resp: diminished breath sounds bilaterally Cardio: S1, S2 normal and systolic murmur: holosystolic 2/6, blowing at apex GI: pos bs, S, NT, ND Extremities: extremities normal, atraumatic, no cyanosis or edema, 2+ pulses in BU&LE  Lab Results:  Recent Labs  09/27/12 0552 09/28/12 0350  WBC 5.0 6.8  HGB 7.9* 8.2*  HCT 22.3* 23.5*  PLT 108* 110*   BMET:   Recent Labs  09/28/12 0350 09/29/12 0515  NA 137 137  K 3.3* 4.0  CL 98 103  CO2 27 28  GLUCOSE 107* 102*  BUN 65* 33*  CREATININE 7.21* 4.40*  CALCIUM 7.7* 7.8*   No results found for this basename: PTH,  in the last 72 hours  Iron Studies: No results found for this basename: IRON, TIBC, TRANSFERRIN, FERRITIN,  in the last 72 hours  Studies/Results: No results found.  I have reviewed the patient's current medications.  Assessment/Plan: 1 AKI/CKD HD 7/6 2/2 increasing Cr. ?Infiltrative, elevated free light chains, SPEP and UPEP pending- cause will need to be determined with renal bx on Mon. Acid/base/K stable. SOB possibly from volume overload, will give Lasix dose 2 Anemia stable as of 7/5. FOBT negative 3 Thrombocytopenia, stable as of 7/5 4 Hypokalemia resolved. K 4.0. 5 HTN controlled P Hold on HD today, Lasix 160mg  po daily, monitor CBC, bx  Monday    LOS: 5 days   Otho Bellows 09/29/2012,8:07 AM I have seen and examined this patient and agree with the plan of care seen,eval, examined and discussed with resident.  Will plan renal bx in am.   Vol xs .  Charna Neeb L 09/30/2012, 12:19 PM

## 2012-09-29 NOTE — Progress Notes (Signed)
Patient ID: Anita Pollard  female  [id number]    DOB: 1940-12-23    DOA: 09/24/2012  PCP: Lawerance Cruel, MD  Interval summary 72 year old female with a hx of HTN, Ca breast cancer,DM diet controlled, hypothyroidism presented to the ED after receiving a call from her nephrologist that her creatinine has significantly worsened since May 2014. In ER, creatinine was 12.20. The patient saw her PCP Dr. Lawerance Cruel, and creatinine was 2.34 on 08/12/2012 as part of routine blood work. Labs redrawn on 08/22/2012 and her serum creatinine was noted to be 2.7.  The patient was referred to nephrology and saw Dr. Shirl Harris on 08/29/12. UA was bland but Cr was 4.2 on 08/29/2012. On 06/12, the patient had a serum creatinine of 6.41. Labs on 09/23/2012 showed a serum creatinine of 11.27 which prompted a call to have the patient go to the ED. A repeat renal ultrasound on 09/24/12 was negative for hydronephrosis. The patient denied any NSAID use however she takes a number of OTC supplements. Nephrology was consulted and started the renal workup. ASO, hepatitis panel are negative, C3 slightly low. Patient has elevated urine free light chains which favour infiltrative renal disease, hence the renal biopsy is planned on Monday. Vascular surgery was also consulted and patient underwent hemodialysis catheter placement on 7/2. Patient underwent first hemodialysis on 09/25/12, then 7/5, still unclear if she is going to need permanent HD.   Consults: Nephrology: Dr. Jimmy Footman Vascular surgery: Dr Kellie Simmering   Assessment/Plan: Acute kidney injury - unclear etiology, with uremia, metabolic acidosis, hypokalemia.   - renal consulted, workup in progress, dialysis catheter placed, started on HD 09/25/12 x 1, second HD on 7/5. 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   Shortness of breath: Unclear in etiology, states started during dialysis yesterday, has no hypoxia, sats 96% on room air, lung examination clear - Started back on her Protonix, chest x-ray reveals no pulmonary edema or pneumonia, ordered 2-D echocardiogram  Hypertension  - Currently stable  Hypothyroidism  -TSH 0.163, reduced home Synthroid dose to 124mcg  Diabetes mellitus2 : Diet controlled at home -hemoglobin A1c 6.8 in 09/25/12, - Placed on NovoLog sliding-scale qac and hs  Metabolic acidosis - improved, bicarbonate drip dc'd   history of Right breast cancer  -Dr. Jana Hakim is oncologist, in remission   Normocytic anemia -Suspect due to renal disease, repeat anemia panel also shows iron level of 188, ferritin 196, TIBC 214 -  Hemoglobin 7.9, FOBT negative, s/p 1 unit packed RBC on 7/2  History of hemorrhoids: placed on hydrocortisone supp per request from patient  DVT Prophylaxis:SCDs  Code Status:  Disposition:    Subjective: States felt to shortness of breath at the dialysis  Objective: Weight change: -1.155 kg (-2 lb 8.8 oz)  Intake/Output Summary (Last 24 hours) at 09/29/12 1132 Last data filed at 09/29/12 0900  Gross per 24 hour  Intake    240 ml  Output   1050 ml  Net   -810 ml   Blood pressure 152/64, pulse 79, temperature 98.4 F (36.9 C), temperature source Oral, resp. rate 18, height 5\' 1"  (1.549 m), weight 59.3 kg (130 lb 11.7 oz),  SpO2 96.00%.  Physical Exam: General: Ax O x3, NAD CVS: S1-S2 clear, no mrg Chest: CTAB, no wheezing, rales or rhonchi Abdomen: soft NT, ND, NBS  Extremities: no c/c/e bilaterally   Lab Results: Basic Metabolic Panel:  Recent Labs Lab 09/24/12 1359  09/28/12 0350  09/29/12 0515  NA  --   < > 137 137  K  --   < > 3.3* 4.0  CL  --   < > 98 103  CO2  --   < > 27 28  GLUCOSE  --   < > 107* 102*  BUN  --   < > 65* 33*  CREATININE  --   < > 7.21* 4.40*  CALCIUM  --   < > 7.7* 7.8*  MG 2.7*  --   --   --   PHOS  --   < > 3.7  --   < > = values in this interval not displayed. Liver Function Tests:  Recent Labs Lab 09/25/12 0639 09/26/12 0610 09/28/12 0350  AST <5  --  13  ALT 12  --  <5  ALKPHOS 44  --  40  BILITOT 0.3  --  0.3  PROT 5.6*  --  5.5*  ALBUMIN 3.1* 3.0* 3.0*   No results found for this basename: LIPASE, AMYLASE,  in the last 168 hours No results found for this basename: AMMONIA,  in the last 168 hours CBC:  Recent Labs Lab 09/24/12 1052  09/27/12 0552 09/28/12 0350  WBC 6.1  < > 5.0 6.8  NEUTROABS 4.2  --   --   --   HGB 8.1*  < > 7.9* 8.2*  HCT 22.1*  < > 22.3* 23.5*  MCV 86.0  < > 88.1 89.7  PLT 136*  < > 108* 110*  < > = values in this interval not displayed. Cardiac Enzymes:  Recent Labs Lab 09/24/12 1359  CKTOTAL 148   BNP: No components found with this basename: POCBNP,  CBG:  Recent Labs Lab 09/25/12 1401 09/28/12 1157 09/28/12 1638 09/28/12 2158 09/29/12 0753  GLUCAP 138* 135* 107* 134* 96     Micro Results: No results found for this or any previous visit (from the past 240 hour(s)).  Studies/Results: US Renal  09/24/2012   *RADIOLOGY REPORT*  Clinical Data: Elevated creatinine  RENAL/URINARY TRACT ULTRASOUND COMPLETE  Comparison:  09/02/2012  Findings:  Right Kidney:  10.5 cm in length.  The kidney is extremely echogenic consistent with medical renal disease and stable from prior exam.  Left Kidney:  10.8 cm in length.  Increased echogenicity is noted similar to that seen on prior study.  A tiny 6 mm cyst is noted. Echogenic focus in the lower pole of the left kidney consistent with a nonobstructing stone.  This is stable from prior exam.  Bladder:  Partially decompressed  IMPRESSION: No mass  lesion or hydronephrosis is noted.  Increased echogenicity is identified consistent with medical renal disease.  Stable nonobstructing left renal stone   Original Report Authenticated By: Inez Catalina, M.D.   US Renal  09/02/2012   *RADIOLOGY REPORT*  Clinical Data: Acute renal failure  RENAL/URINARY TRACT ULTRASOUND COMPLETE  Comparison:  CT virtual colonoscopy of 12/02/2010  Findings:  Right Kidney:  No hydronephrosis is seen.  The right kidney measures 10.8 cm sagittally.  However, the parenchyma of the right kidney is markedly echogenic consistent with chronic renal medical disease.  Left Kidney:  No hydronephrosis is noted.  The left  kidney measures 10.4 cm.  The parenchyma also is very echogenic consistent with chronic renal medical disease.  Probable nonshadowing calculi are noted.  A lower pole cyst is present of 0.6 mm in diameter.  Bladder:  The urinary bladder is not distended cannot be well evaluated.  IMPRESSION:  1.  No hydronephrosis. 2.  Very echogenic renal parenchyma bilaterally consistent with chronic renal medical disease. 3.  Echogenic foci in the left kidney consistent with calculi which are not obstructing.   Original Report Authenticated By: Ivar Drape, M.D.   Mm Digital Diagnostic Bilat  08/29/2012   *RADIOLOGY REPORT*  Clinical Data:  Malignant lumpectomy of the right breast in June, 2010.  Annual evaluation.  DIGITAL DIAGNOSTIC BILATERAL MAMMOGRAM WITH CAD  Comparison: 08/28/2011, 05/07 03/2010, dating back to 07/28/2008.  Findings:  ACR Breast Density Category 3: The breast tissue is heterogeneously dense.  CC and MLO views of both breasts and a spot tangential view of the right breast at the lumpectomy site were obtained.  Post lumpectomy scarring in the retroareolar right breast.  There are no findings suspicious for malignancy.  Mammographic images were processed with CAD.  IMPRESSION: No specific mammographic evidence of malignancy.  Post lumpectomy scarring in the retroareolar  right breast.  RECOMMENDATION: Bilateral diagnostic mammography in 1 year.  The patient was encouraged to perform monthly self breast examination and communicate any changes with her primary physician. I have discussed the findings and recommendations with the patient. Results were also provided in writing at the conclusion of the visit.  If applicable, a reminder letter will be sent to the patient regarding her next appointment.  BI-RADS CATEGORY 2:  Benign finding(s).   Original Report Authenticated By: Evangeline Dakin, M.D.    Medications: Scheduled Meds: . [START ON 09/30/2012] desmopressin (DDAVP) IV  20 mcg Intravenous Once  . furosemide  160 mg Oral Daily  . hydrocortisone  25 mg Rectal BID  . insulin aspart  0-5 Units Subcutaneous QHS  . insulin aspart  0-9 Units Subcutaneous TID WC  . levothyroxine  125 mcg Oral QAC breakfast  . multivitamin  1 tablet Oral Daily  . pantoprazole  40 mg Oral Daily  . sevelamer carbonate  4.8 g Oral TID WC  . sodium chloride  3 mL Intravenous Q12H      LOS: 5 days   Cynai Skeens M.D. [facility] 09/29/2012, 11:32 AM Pager: [phone]  If 7PM-7AM, please contact night-coverage [email or web address] Password [email or web address]

## 2012-09-30 ENCOUNTER — Inpatient Hospital Stay (HOSPITAL_COMMUNITY): Payer: Medicare Other

## 2012-09-30 DIAGNOSIS — I369 Nonrheumatic tricuspid valve disorder, unspecified: Secondary | ICD-10-CM

## 2012-09-30 LAB — CBC
HCT: 21.2 % — ABNORMAL LOW (ref 36.0–46.0)
MCHC: 34.9 g/dL (ref 30.0–36.0)
Platelets: 134 10*3/uL — ABNORMAL LOW (ref 150–400)
RDW: 11.6 % (ref 11.5–15.5)
WBC: 10.5 10*3/uL (ref 4.0–10.5)

## 2012-09-30 LAB — BASIC METABOLIC PANEL
BUN: 50 mg/dL — ABNORMAL HIGH (ref 6–23)
CO2: 25 mEq/L (ref 19–32)
Chloride: 98 mEq/L (ref 96–112)
Creatinine, Ser: 5.55 mg/dL — ABNORMAL HIGH (ref 0.50–1.10)
GFR calc Af Amer: 8 mL/min — ABNORMAL LOW (ref >90)
Glucose, Bld: 100 mg/dL — ABNORMAL HIGH (ref 70–99)
Potassium: 4 mEq/L (ref 3.5–5.1)

## 2012-09-30 LAB — GLUCOSE, CAPILLARY
Glucose-Capillary: 90 mg/dL (ref 70–99)
Glucose-Capillary: 118 mg/dL — ABNORMAL HIGH (ref 70–99)

## 2012-09-30 LAB — PROTIME-INR: INR: 1.13 (ref 0.00–1.49)

## 2012-09-30 MED ORDER — BENZONATATE 100 MG PO CAPS
200.0000 mg | ORAL_CAPSULE | Freq: Three times a day (TID) | ORAL | Status: DC | PRN
  Filled 2012-09-30: qty 2

## 2012-09-30 MED ORDER — SODIUM CHLORIDE 0.9 % IV SOLN
INTRAVENOUS | Status: DC
  Administered 2012-09-30: 1000 mL via INTRAVENOUS

## 2012-09-30 MED ORDER — ACETAMINOPHEN 500 MG PO TABS: 500.0000 mg | ORAL_TABLET | Freq: Four times a day (QID) | ORAL | Status: DC | PRN

## 2012-09-30 MED ORDER — HYDROMORPHONE HCL PF 1 MG/ML IJ SOLN
1.0000 mg | Freq: Four times a day (QID) | INTRAMUSCULAR | Status: DC | PRN
  Administered 2012-09-30: 1 mg via INTRAVENOUS
  Filled 2012-09-30: qty 1

## 2012-09-30 MED ORDER — SODIUM CHLORIDE 0.9 % IV SOLN: INTRAVENOUS | Status: DC

## 2012-09-30 MED ORDER — OXYCODONE-ACETAMINOPHEN 5-325 MG PO TABS
1.0000 | ORAL_TABLET | ORAL | Status: DC | PRN
  Filled 2012-09-30: qty 2

## 2012-09-30 MED ORDER — ZOLPIDEM TARTRATE 5 MG PO TABS
5.0000 mg | ORAL_TABLET | Freq: Every day | ORAL | Status: DC
  Administered 2012-10-01 – 2012-10-07 (×8): 5 mg via ORAL
  Filled 2012-09-30 (×9): qty 1

## 2012-09-30 NOTE — Progress Notes (Signed)
Subjective: Still complaining of SOB with cough. No fevers, no chills. She denies sputum production, chest pain. No palpitations.  She is awaiting bx today. Objective: Vital signs in last 24 hours: Temp:  [98.3 F (36.8 C)-98.5 F (36.9 C)] 98.5 F (36.9 C) (07/07 0500) Pulse Rate:  [58-80] 58 (07/07 0500) Resp:  [18] 18 (07/07 0500) BP: (152-180)/(64-75) 156/64 mmHg (07/07 0500) SpO2:  [95 %-99 %] 95 % (07/07 0500) Weight:  [134 lb 14.7 oz (61.2 kg)] 134 lb 14.7 oz (61.2 kg) (07/06 2136) Weight change: 3 lb 15.5 oz (1.8 kg)  Intake/Output from previous day: 07/06 0701 - 07/07 0700 In: 480 [P.O.:480] Out: 800 [Urine:800] Intake/Output this shift:    General appearance: alert, cooperative and pale Resp: clear to auscultation bilaterally. No increased work of breathing. She coughs intermittently. Cardio: S1, S2 normal and systolic murmur: holosystolic 2/6, blowing at apex GI: pos bs, S, NT, ND Extremities: extremities normal, atraumatic, no cyanosis or edema, 2+ pulses in BU&LE  Lab Results:  Recent Labs  09/28/12 0350  WBC 6.8  HGB 8.2*  HCT 23.5*  PLT 110*   BMET:   Recent Labs  09/29/12 0515 09/30/12 0500  NA 137 134*  K 4.0 4.0  CL 103 98  CO2 28 25  GLUCOSE 102* 100*  BUN 33* 50*  CREATININE 4.40* 5.55*  CALCIUM 7.8* 7.9*    Studies/Results: Dg Chest 2 View  09/29/2012   *RADIOLOGY REPORT*  Clinical Data: Dyspnea and cough  CHEST - 2 VIEW  Comparison: 10/23/2012  Findings: There is a right sided IJ catheter with tip in the distal SVC.  Heart size is normal.  No pleural effusion or edema.  No airspace consolidation identified.  IMPRESSION:  1.  No acute cardiopulmonary abnormalities.   Original Report Authenticated By: Kerby Moors, M.D.    I have reviewed the patient's current medications.  Assessment/Plan: 1 AKI/CKD: Had HD on7/2 and 7/6 2/2 increasing Cr. 4.4 >>5.55. ?Infiltrative with elevated free light chains.  SPEP and UPEP pending- Awaiting  renal Bx today. Acid/base/K stable. No acute needs for HD. 2 Anemia stable as of 7/5. FOBT negative 3 Thrombocytopenia, stable as of 7/5. Abnormal platelet function. Desmopressin pre-biopsy for prevention of bleeding. 4 Hypokalemia resolved. K 4.0. 5 HTN controlled 6. SOB and Cough: CXR normal. No features of pneumonia. Will treat symptomatically for ?acute bronchitis.    LOS: 6 days   Signed:  Jessee Avers, MD PGY-5 Internal Medicine Teaching Service Pager: [phone] 09/30/2012, 11:27 AM

## 2012-09-30 NOTE — Progress Notes (Signed)
  Echocardiogram 2D Echocardiogram has been performed.  Basilia Jumbo 09/30/2012, 12:17 PM

## 2012-09-30 NOTE — Progress Notes (Signed)
I have personally seen and examined this patient and agree with the assessment/plan as outlined above by Alice Rieger MD (PGY2). Plans for renal biopsy today by Dr.Deterding (pre-test suspicion on LCDD). anticipated preliminary report will be given on Wednesday or Thursday of this week.  Renal function slightly worse today but no HD needs identified. Remains non-oliguric but poor clearance.   Anita Pollard.,MD 09/30/2012 12:14 PM

## 2012-09-30 NOTE — Progress Notes (Signed)
[facility] PROGRESS NOTE  KETA SCHARTNER [id number] DOB: [birth year] DOA: 09/24/2012 PCP: Lawerance Cruel, MD  Assessment/Plan: Active Problems:   HYPERTENSION, BENIGN   Breast cancer   Anemia, unspecified   DM2 (diabetes mellitus, type 2)   AKI (acute kidney injury)     Interval summary  72 year old female with a hx of HTN, Ca breast cancer,DM diet controlled, hypothyroidism presented to the ED after receiving a call from her nephrologist that her creatinine has significantly worsened since May 2014. In ER, creatinine was 12.20. The patient saw her PCP Dr. Lawerance Cruel, and creatinine was 2.34 on 08/12/2012 as part of routine blood work. Labs redrawn on 08/22/2012 and her serum creatinine was noted to be 2.7.  The patient was referred to nephrology and saw Dr. Shirl Harris on 08/29/12. UA was bland but Cr was 4.2 on 08/29/2012. On 06/12, the patient had a serum creatinine of 6.41. Labs on 09/23/2012 showed a serum creatinine of 11.27 which prompted a call to have the patient go to the ED. A repeat renal ultrasound on 09/24/12 was negative for hydronephrosis. The patient denied any NSAID use however she takes a number of OTC supplements.  Nephrology was consulted and started the renal workup. ASO, hepatitis panel are negative, C3 slightly low. Patient has elevated urine free light chains which favour infiltrative renal disease, hence the renal biopsy is planned on Monday.  Vascular surgery was also consulted and patient underwent hemodialysis catheter placement on 7/2. Patient underwent first hemodialysis on 09/25/12, then 7/5, still unclear if she is going to need permanent HD.   Consults:  Nephrology: Dr. Jimmy Footman  Vascular surgery: Dr Kellie Simmering   Assessment/Plan:  Acute kidney injury - unclear etiology, with uremia, metabolic acidosis, hypokalemia.  - renal consulted, workup in progress, dialysis catheter placed, started on HD 09/25/12 x 1, second HD on 7/5.  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  Renal biopsy done PFA not done as sample hemo lysed Monitor platelet count and HG     Shortness of breath: Unclear in etiology, states started during dialysis yesterday, has no hypoxia, sats 96% on room air, lung examination clear  - Started back on her Protonix, chest x-ray reveals no pulmonary edema or pneumonia, ordered 2-D echocardiogram    Hypertension  - Currently stable   Hypothyroidism  -TSH 0.163, reduced home Synthroid dose to 170mcg   Diabetes mellitus2 : Diet controlled at home  -hemoglobin A1c 6.8 in 09/25/12,  - Placed on NovoLog sliding-scale qac and hs   Metabolic acidosis - improved, bicarbonate drip dc'd  history of Right breast cancer  -Dr. Jana Hakim is oncologist, in remission   Normocytic anemia -Suspect due to renal disease, repeat anemia panel also shows iron level of 188, ferritin 196, TIBC 214  - Hemoglobin 7.9, FOBT negative, s/p 1 unit packed RBC on 7/2  History of hemorrhoids: placed on hydrocortisone supp per request from patient    DVT Prophylaxis:SCDs  Code Status:  Disposition:   Objective: Filed Vitals:   09/30/12 0906 09/30/12 1240 09/30/12 1242 09/30/12 1250  BP: 156/76 157/73 156/64 146/61  Pulse: 58     Temp: 98.6 F (37  C)     TempSrc:      Resp: 18     Height:      Weight:      SpO2: 92%       Intake/Output Summary (Last 24 hours) at 09/30/12 1322 Last data filed at 09/30/12 0907  Gross per 24 hour  Intake      0 ml  Output   1400 ml  Net  -1400 ml    Exam: General appearance: alert, cooperative  and pale  Resp: clear to auscultation bilaterally. No increased work of breathing. She coughs intermittently.  Cardio: S1, S2 normal and systolic murmur: holosystolic 2/6, blowing at apex  GI: pos bs, S, NT, ND  Extremities: extremities normal, atraumatic, no cyanosis or edema, 2+ pulses in BU&LE       Data Reviewed: Basic Metabolic Panel:  Recent Labs Lab 09/24/12 1052 09/24/12 1359 09/25/12 0639  09/26/12 0610 09/26/12 1710 09/27/12 0552 09/28/12 0350 09/29/12 0515 09/30/12 0500  NA 138  --  141  < > 142 138 139 137 137 134*  K 3.5  --  2.1*  < > 3.3* 3.5 3.5 3.3* 4.0 4.0  CL 102  --  100  < > 105 100 103 98 103 98  CO2 11*  --  19  < > 23 27 26 27 28 25   GLUCOSE 121*  --  139*  < > 129* 116* 110* 107* 102* 100*  BUN 166*  --  166*  < > 58* 55* 60* 65* 33* 50*  CREATININE 12.20*  --  11.62*  < > 5.79* 5.88* 6.44* 7.21* 4.40* 5.55*  CALCIUM 7.5*  --  7.1*  < > 7.2* 7.3* 7.4* 7.7* 7.8* 7.9*  MG  --  2.7*  --   --   --   --   --   --   --   --   PHOS  --   --  8.6*  --  3.6  --   --  3.7  --   --   < > = values in this interval not displayed.  Liver Function Tests:  Recent Labs Lab 09/24/12 1052 09/25/12 0639 09/26/12 0610 09/28/12 0350  AST 5 <5  --  13  ALT 15 12  --  <5  ALKPHOS 49 44  --  40  BILITOT 0.3 0.3  --  0.3  PROT 6.4 5.6*  --  5.5*  ALBUMIN 3.6 3.1* 3.0* 3.0*   No results found for this basename: LIPASE, AMYLASE,  in the last 168 hours No results found for this basename: AMMONIA,  in the last 168 hours  CBC:  Recent Labs Lab 09/24/12 1052 09/25/12 0639 09/26/12 0610 09/27/12 0552 09/28/12 0350  WBC 6.1 4.9 3.9* 5.0 6.8  NEUTROABS 4.2  --   --   --   --   HGB 8.1* 6.7* 7.9* 7.9* 8.2*  HCT 22.1* 18.3* 21.8* 22.3* 23.5*  MCV 86.0 83.6 84.8 88.1 89.7  PLT 136* 122* 113* 108* 110*    Cardiac Enzymes:  Recent Labs Lab 09/24/12 1359  CKTOTAL 148   BNP (last 3 results) No results found for this basename: PROBNP,  in the last 8760  hours   CBG:  Recent Labs Lab 09/29/12 1209 09/29/12 1718 09/29/12 2135 09/30/12 0745 09/30/12 1127  GLUCAP 102* 98 130* 98 90    No results found for this or any previous visit (from the past 240 hour(s)).   Studies: Ct Abdomen Pelvis  Wo Contrast  09/26/2012   *RADIOLOGY REPORT*  Clinical Data: Acute renal insufficiency.  Question retroperitoneal obstruction.  CT ABDOMEN AND PELVIS WITHOUT CONTRAST  Technique:  Multidetector CT imaging of the abdomen and pelvis was performed following the standard protocol without intravenous contrast.  Comparison: 12/02/2010.  Findings: Lung bases show no acute findings.  Heart size normal. No pericardial or pleural effusion.  Liver, gallbladder, and right adrenal gland are unremarkable.  Left adrenal gland appears minimally prominent, as before, stable. Stones are seen in the kidneys bilaterally, measuring up to 11 mm on the left.  Ureters are decompressed.  Spleen is unremarkable. Calcifications are seen in an atrophic pancreas.  No ductal dilatation.  Stomach and bowel are unremarkable.  Foley catheter is seen in a decompressed bladder.  Hysterectomy. Ovaries are visualized.  No free fluid.  Atherosclerotic calcification of the arterial vasculature without abdominal aortic aneurysm.  No pathologically enlarged lymph nodes. No worrisome lytic or sclerotic lesions.  Right hip arthroplasty.  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  IMPRESSION:  1.  Bilateral renal stones without obstruction. 2.  Mild prominence of the left adrenal gland, stable. 3.  Chronic calcific pancreatitis. 4.  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   Original Report Authenticated By: Lorin Picket, M.D.   Dg Chest 2 View  09/29/2012   *RADIOLOGY REPORT*  Clinical Data: Dyspnea and cough  CHEST - 2 VIEW  Comparison: 10/23/2012  Findings: There is a right sided IJ catheter with tip in the distal SVC.  Heart size is normal.  No pleural effusion or  edema.  No airspace consolidation identified.  IMPRESSION:  1.  No acute cardiopulmonary abnormalities.   Original Report Authenticated By: Kerby Moors, M.D.   US Renal  09/24/2012   *RADIOLOGY REPORT*  Clinical Data: Elevated creatinine  RENAL/URINARY TRACT ULTRASOUND COMPLETE  Comparison:  09/02/2012  Findings:  Right Kidney:  10.5 cm in length.  The kidney is extremely echogenic consistent with medical renal disease and stable from prior exam.  Left Kidney:  10.8 cm in length.  Increased echogenicity is noted similar to that seen on prior study.  A tiny 6 mm cyst is noted. Echogenic focus in the lower pole of the left kidney consistent with a nonobstructing stone.  This is stable from prior exam.  Bladder:  Partially decompressed  IMPRESSION: No mass lesion or hydronephrosis is noted.  Increased echogenicity is identified consistent with medical renal disease.  Stable nonobstructing left renal stone   Original Report Authenticated By: Inez Catalina, M.D.   US Renal  09/02/2012   *RADIOLOGY REPORT*  Clinical Data: Acute renal failure  RENAL/URINARY TRACT ULTRASOUND COMPLETE  Comparison:  CT virtual colonoscopy of 12/02/2010  Findings:  Right Kidney:  No hydronephrosis is seen.  The right kidney measures 10.8 cm sagittally.  However, the parenchyma of the right kidney is markedly echogenic consistent with chronic renal medical disease.  Left Kidney:  No hydronephrosis is noted.  The left kidney measures 10.4 cm.  The parenchyma also is very echogenic consistent with chronic renal medical disease.  Probable nonshadowing calculi are noted.  A lower pole cyst is present of 0.6 mm in diameter.  Bladder:  The urinary bladder is not distended cannot be well evaluated.  IMPRESSION:  1.  No hydronephrosis. 2.  Very echogenic renal parenchyma bilaterally consistent with chronic renal medical disease. 3.  Echogenic foci in the left kidney consistent with calculi which are not obstructing.  Original Report Authenticated  By: Ivar Drape, M.D.   Dg Chest Port 1 View  09/25/2012   *RADIOLOGY REPORT*  Clinical Data: Status post dialysis catheter placement  PORTABLE CHEST - 1 VIEW  Comparison: 04/24/2008  Findings: There is a right chest wall dialysis catheter with tips in the distal SVC and cavoatrial junction. No pneumothorax identified.  Normal heart size.  No pleural effusion or edema.  No airspace consolidation identified.  IMPRESSION:  1.  No complications status post dialysis catheter placement.   Original Report Authenticated By: Kerby Moors, M.D.   Dg Fluoro Guide Cv Line-no Report  09/25/2012   CLINICAL DATA: placement dialysis catheter   FLOURO GUIDE CV LINE  Fluoroscopy was utilized by the requesting physician.  No radiographic  interpretation.     Scheduled Meds: . insulin aspart  0-5 Units Subcutaneous QHS  . insulin aspart  0-9 Units Subcutaneous TID WC  . levothyroxine  125 mcg Oral QAC breakfast  . LORazepam  4 mg Oral Once  . multivitamin  1 tablet Oral Daily  . pantoprazole  40 mg Oral Daily  . sevelamer carbonate  4.8 g Oral TID WC  . sodium chloride  3 mL Intravenous Q12H   Continuous Infusions: . sodium chloride 10 mL/hr at 09/30/12 0619  . sodium chloride      Active Problems:   HYPERTENSION, BENIGN   Breast cancer   Anemia, unspecified   DM2 (diabetes mellitus, type 2)   AKI (acute kidney injury)    Time spent: 40 minutes   [facility] Pager [phone]. If 8PM-8AM, please contact night-coverage at [email or web address], password [facility] 09/30/2012, 1:22 PM  LOS: 6 days

## 2012-09-30 NOTE — Progress Notes (Signed)
Patient having small amount of blood in urine after kidney biopsy. Patient tried to void twice with little produced, except blood. Samples labeledd and kept in patient bathroom. Patient experiencing burning and urge to urinate. Patient bladder scan was done with 853ml in bladder. MD was notified and new orders were made.

## 2012-09-30 NOTE — Progress Notes (Signed)
Placed a three way foley (76fr)  with very little return, therefore irrigated with 30cc ns and got several small clots back.  Patient had 600cc bloody urine - tolerated very well.

## 2012-09-30 NOTE — Procedures (Signed)
Patient in prone position.  Kidneys localized with U/S.  Prep clorohexadine, xylocaine LA.  Using U/S guidance 4 passes to obtain 2 cores of tissue.  EBL 15.  Tolerated well.  See post Renal biopsy orders.

## 2012-10-01 ENCOUNTER — Inpatient Hospital Stay (HOSPITAL_COMMUNITY): Payer: Medicare Other

## 2012-10-01 LAB — CBC
HCT: 17.3 % — ABNORMAL LOW (ref 36.0–46.0)
HCT: 20.6 % — ABNORMAL LOW (ref 36.0–46.0)
Hemoglobin: 6 g/dL — CL (ref 12.0–15.0)
Hemoglobin: 8 g/dL — ABNORMAL LOW (ref 12.0–15.0)
Hemoglobin: 7.3 g/dL — ABNORMAL LOW (ref 12.0–15.0)
MCH: 31.9 pg (ref 26.0–34.0)
MCH: 31.3 pg (ref 26.0–34.0)
MCHC: 34.7 g/dL (ref 30.0–36.0)
MCHC: 35.4 g/dL (ref 30.0–36.0)
MCV: 88.4 fL (ref 78.0–100.0)
Platelets: 105 10*3/uL — ABNORMAL LOW (ref 150–400)
RBC: 1.93 MIL/uL — ABNORMAL LOW (ref 3.87–5.11)
RBC: 2.51 MIL/uL — ABNORMAL LOW (ref 3.87–5.11)
RBC: 2.33 MIL/uL — ABNORMAL LOW (ref 3.87–5.11)
WBC: 7.6 10*3/uL (ref 4.0–10.5)
WBC: 6.8 10*3/uL (ref 4.0–10.5)

## 2012-10-01 LAB — PROTEIN ELECTROPHORESIS, SERUM
Albumin ELP: 62 % (ref 55.8–66.1)
Alpha-1-Globulin: 5.8 % — ABNORMAL HIGH (ref 2.9–4.9)
Alpha-2-Globulin: 12.5 % — ABNORMAL HIGH (ref 7.1–11.8)
Beta Globulin: 5.7 % (ref 4.7–7.2)
M-Spike, %: NOT DETECTED g/dL
Total Protein ELP: 4.4 g/dL — ABNORMAL LOW (ref 6.0–8.3)

## 2012-10-01 LAB — BASIC METABOLIC PANEL
BUN: 62 mg/dL — ABNORMAL HIGH (ref 6–23)
CO2: 25 mEq/L (ref 19–32)
Calcium: 8.1 mg/dL — ABNORMAL LOW (ref 8.4–10.5)
Creatinine, Ser: 5.98 mg/dL — ABNORMAL HIGH (ref 0.50–1.10)
GFR calc non Af Amer: 6 mL/min — ABNORMAL LOW (ref >90)
Glucose, Bld: 159 mg/dL — ABNORMAL HIGH (ref 70–99)

## 2012-10-01 LAB — UIFE/LIGHT CHAINS/TP QN, 24-HR UR
Albumin, U: DETECTED
Alpha 1, Urine: DETECTED — AB
Alpha 2, Urine: DETECTED — AB
Beta, Urine: DETECTED — AB
Free Kappa Lt Chains,Ur: 7.74 mg/dL — ABNORMAL HIGH (ref 0.14–2.42)
Gamma Globulin, Urine: DETECTED — AB

## 2012-10-01 LAB — GLUCOSE, CAPILLARY: Glucose-Capillary: 177 mg/dL — ABNORMAL HIGH (ref 70–99)

## 2012-10-01 LAB — PREPARE RBC (CROSSMATCH)

## 2012-10-01 MED ORDER — SODIUM CHLORIDE 0.9 % IV SOLN
20.0000 ug | Freq: Once | INTRAVENOUS | Status: AC
  Administered 2012-10-01: 20 ug via INTRAVENOUS
  Filled 2012-10-01: qty 5

## 2012-10-01 MED ORDER — SODIUM CHLORIDE 0.9 % IV SOLN
INTRAVENOUS | Status: DC
  Administered 2012-10-02: 50 mL via INTRAVENOUS

## 2012-10-01 NOTE — Progress Notes (Signed)
MEDICATION RELATED CONSULT NOTE - INITIAL   Pharmacy Consult for Desmopressin Indication: Bleeding s/p Renal Biopsy  Allergies  Allergen Reactions  . Detrol (Tolterodine)     Leg cramps  . Penicillins Swelling    Patient Measurements: Height: 5\' 1"  (154.9 cm) Weight: 132 lb 6.4 oz (60.056 kg) IBW/kg (Calculated) : 47.8  Vital Signs: Temp: 98.5 F (36.9 C) (07/08 1645) BP: 160/53 mmHg (07/08 1645) Pulse Rate: 56 (07/08 1645) Intake/Output from previous day: 07/07 0701 - 07/08 0700 In: 1830 [P.O.:340; I.V.:1197.5; Blood:262.5] Out: 1600 [Urine:1600] Intake/Output from this shift:    Labs:  Recent Labs  09/29/12 0515 09/30/12 0500  09/30/12 2200 10/01/12 0818 10/01/12 1502  WBC  --   --   < > 7.6 6.8 6.3  HGB  --   --   < > 6.0* 8.0* 7.3*  HCT  --   --   < > 17.3* 22.1* 20.6*  PLT  --   --   < > 105* 105* 105*  APTT  --  28  --   --   --   --   CREATININE 4.40* 5.55*  --   --   --  5.98*  < > = values in this interval not displayed. Estimated Creatinine Clearance: 6.9 ml/min (by C-G formula based on Cr of 5.98).   Microbiology: No results found for this or any previous visit (from the past 720 hour(s)).  Medical History: Past Medical History  Diagnosis Date  . Hypothyroidism   . GERD (gastroesophageal reflux disease)   . HTN (hypertension)   . Diabetes mellitus   . Arthritis   . Anemia     Medications:  Scheduled:  . desmopressin (DDAVP) IV  20 mcg Intravenous Once  . insulin aspart  0-5 Units Subcutaneous QHS  . insulin aspart  0-9 Units Subcutaneous TID WC  . levothyroxine  125 mcg Oral QAC breakfast  . LORazepam  4 mg Oral Once  . multivitamin  1 tablet Oral Daily  . pantoprazole  40 mg Oral Daily  . sevelamer carbonate  4.8 g Oral TID WC  . sodium chloride  3 mL Intravenous Q12H  . zolpidem  5 mg Oral QHS   Infusions:  . sodium chloride 10 mL/hr at 09/30/12 0619  . sodium chloride 10 mL/hr (09/30/12 1400)  . sodium chloride 1,000 mL  (09/30/12 2046)  . sodium chloride      Assessment/Plan: 72 yo F s/p renal biopsy.  Dr. Allyson Sabal called and asked pharmacy to recommend a dose of desmopressin.  Typical dose is 0.3-0.4 mcg/kg IV over 10-30 minutes.  I have entered the dose as asked.    We also discussed the adverse effects, most notably hyponatremia and hypertension during administration.  This medication is safe to use in patient's with renal disease.   [employer], Pharm.D., BCPS Clinical Pharmacist Pager [phone] 10/01/2012 7:33 PM

## 2012-10-01 NOTE — Progress Notes (Signed)
Subjective: Patient is a little disappointed about HD orders she was expecting today AM, Pain at right IV site which has been changed this AM and nursing staff putting her on commode yesterday when she was supposed to be on bed rest.  Left renal bx 7/7 Hematuria last night with blood clots. Bladder scan with 840 cc retained >> three way foley and draining  No abdominal pain  No SOB No fevers.  She received one unit of blood last night.   Objective: Vital signs in last 24 hours: Temp:  [98.4 F (36.9 C)-99.1 F (37.3 C)] 98.9 F (37.2 C) (07/08 0630) Pulse Rate:  [58-73] 64 (07/08 0630) Resp:  [16-18] 16 (07/08 0630) BP: (122-158)/(58-76) 149/74 mmHg (07/08 0630) SpO2:  [90 %-98 %] 95 % (07/08 0342) Weight:  [132 lb 6.4 oz (60.056 kg)] 132 lb 6.4 oz (60.056 kg) (07/07 2132) Weight change: -2 lb 8.3 oz (-1.144 kg)  Intake/Output from previous day: 07/07 0701 - 07/08 0700 In: 1830 [P.O.:340; I.V.:1197.5; Blood:262.5] Out: 1600 [Urine:1600] Intake/Output this shift:    General appearance: alert, cooperative and pale. Son at bed side. Resp: clear to auscultation bilaterally. No increased work of breathing. She coughs intermittently. Abdomen: Normal fullness, normal BS, non-tender, no rebound of guarding tenderness. Left bx site no bleeding. Bloody urine in urinary bag 350cc Cardio: S1, S2 normal and systolic murmur: holosystolic 2/6, blowing at apex Extremities: extremities normal, atraumatic, no cyanosis or edema, 2+ pulses in BU&LE  Lab Results:  Recent Labs  09/30/12 1728 09/30/12 2200  WBC 10.5 7.6  HGB 7.4* 6.0*  HCT 21.2* 17.3*  PLT 134* 105*   BMET:   Recent Labs  09/29/12 0515 09/30/12 0500  NA 137 134*  K 4.0 4.0  CL 103 98  CO2 28 25  GLUCOSE 102* 100*  BUN 33* 50*  CREATININE 4.40* 5.55*  CALCIUM 7.8* 7.9*    Studies/Results: Dg Chest 2 View  09/29/2012   *RADIOLOGY REPORT*  Clinical Data: Dyspnea and cough  CHEST - 2 VIEW  Comparison: 10/23/2012   Findings: There is a right sided IJ catheter with tip in the distal SVC.  Heart size is normal.  No pleural effusion or edema.  No airspace consolidation identified.  IMPRESSION:  1.  No acute cardiopulmonary abnormalities.   Original Report Authenticated By: Kerby Moors, M.D.   US Biopsy-no Radiologist  09/30/2012   CLINICAL DATA: deterding   Ultrasound was utilized for biopsy by the requesting physician.     I have reviewed the patient's current medications.  Assessment/Plan: # AKI/CKD: Had HD on7/2 and 7/6 2/2 increasing Cr. 4.4 >>5.55. ?Infiltrative with elevated free light chains.  Renal biopsy 7/7. SPEP and UPEP pending. Today's labs pending.   Plan  - will discuss with Dr Posey Pronto about HD today  # Anemia. Related to acute blood loss from renal bx in setting of chronic anemia related to CKD. Hgb 7.4 >>6.0. Had 1 unit of PRBC. Post transfusion Hgb pending. She is asymptomatic. Has hematuria related to biopsy. FOBT negative Plan  - CBC q12hrs  - Hgb goal >7 - Monitor hematuria  #Thrombocytopenia, stable as of 7/5. Abnormal platelet function. Desmopressin pre-biopsy for prevention of bleeding. #Hypokalemia resolved. K 4.0. #HTN controlled #SOB and Cough: Improved today. Continue with Tessalon     LOS: 7 days   Signed:  Jessee Avers, MD PGY-5 Internal Medicine Teaching Service Pager: [phone] 10/01/2012, 8:35 AM

## 2012-10-01 NOTE — Progress Notes (Signed)
Results  hgb  7.4  Now  6.0   Per lab call. Baltazar Najjar NP  Notified of  Results, orders  Received for  Blood  Transfusion. Patient  With  Continued  Bloody  Urine .

## 2012-10-01 NOTE — Progress Notes (Signed)
[facility] PROGRESS NOTE  MARIEN MAGNER [id number] DOB: 1941-02-27 DOA: 09/24/2012 PCP: Lawerance Cruel, MD  Assessment/Plan: Active Problems:   HYPERTENSION, BENIGN   Breast cancer   Anemia, unspecified   DM2 (diabetes mellitus, type 2)   AKI (acute kidney injury)    Interval summary  72 year old female with a hx of HTN, Ca breast cancer,DM diet controlled, hypothyroidism presented to the ED after receiving a call from her nephrologist that her creatinine has significantly worsened since May 2014. In ER, creatinine was 12.20. The patient saw her PCP Dr. Lawerance Cruel, and creatinine was 2.34 on 08/12/2012 as part of routine blood work. Labs redrawn on 08/22/2012 and her serum creatinine was noted to be 2.7.  The patient was referred to nephrology and saw Dr. Shirl Harris on 08/29/12. UA was bland but Cr was 4.2 on 08/29/2012. On 06/12, the patient had a serum creatinine of 6.41. Labs on 09/23/2012 showed a serum creatinine of 11.27 which prompted a call to have the patient go to the ED. A repeat renal ultrasound on 09/24/12 was negative for hydronephrosis. The patient denied any NSAID use however she takes a number of OTC supplements.  Nephrology was consulted and started the renal workup. ASO, hepatitis panel are negative, C3 slightly low. Patient has elevated urine free light chains which favour infiltrative renal disease, hence the renal biopsy is planned on Monday.  Vascular surgery was also consulted and patient underwent hemodialysis catheter placement on 7/2. Patient underwent first hemodialysis on 09/25/12, then 7/5, still unclear if she is going to need permanent HD.    Consults:  Nephrology: Dr. Jimmy Footman  Vascular surgery: Dr Kellie Simmering    Assessment/Plan:  Acute kidney injury - unclear etiology, with uremia, metabolic acidosis, hypokalemia.  - renal consulted, workup in progress, dialysis catheter placed, started on HD 09/25/12 x 1, second HD on 7/5.  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  Renal biopsy done  PFA not done as sample hemo lysed  Developed hematuria yesterday after renal biopsy, has a Foley in place because of retention Possible hemodialysis today   Shortness of breath: Unclear in etiology, states started during dialysis yesterday, has no hypoxia, sats 96% on room air, lung examination clear  - Started back on her Protonix, chest x-ray reveals no pulmonary edema or pneumonia,  Echo shows EF of 65-70% PA pressure of 47 mm   Hypertension  - Currently stable   Hypothyroidism  -TSH 0.163, reduced home Synthroid dose to 173mcg   Diabetes mellitus2 : Diet controlled at home  -hemoglobin A1c 6.8 in 09/25/12,  - Placed on NovoLog sliding-scale qac and hs    Metabolic acidosis - improved, bicarbonate drip dc'd  history of Right breast cancer  -Dr. Jana Hakim is oncologist, in remission    Normocytic anemia now with gross hematuria status post renal biopsy -Suspect due to renal disease, repeat anemia panel also shows iron level of 188, ferritin 196, TIBC 214  - Hemoglobin 6.0, FOBT negative, s/p 2 unit packed RBC on 7/2 , 7/7 Monitor CBC every 8 hours    History of hemorrhoids: placed on hydrocortisone supp per request from patient  HPI/Subjective: Hemoglobin drop, developed hematuria has 3 way Foley in place, developed some retention due to hematuria last night   Objective: Filed Vitals:   10/01/12 0415 10/01/12 0445 10/01/12 0500 10/01/12 0630  BP: 158/73 152/72 148/69 149/74  Pulse: 64 64 64 64  Temp: 99 F (37.2 C) 99  F (37.2 C) 99 F (37.2 C) 98.9 F (37.2 C)  TempSrc: Oral Oral Oral Oral  Resp: 16 16 16 16   Height:      Weight:      SpO2:        Intake/Output Summary (Last 24 hours) at 10/01/12 0853 Last data filed at 10/01/12 0630  Gross per 24 hour  Intake   1830 ml  Output   1600 ml  Net    230 ml    Exam: General appearance: alert, cooperative and pale. Son at bed side.  Resp: clear to auscultation bilaterally. No increased work of breathing. She coughs intermittently.  Abdomen: Normal fullness, normal BS, non-tender, no rebound of guarding tenderness. Left bx site no bleeding. Bloody urine in urinary bag 350cc  Cardio: S1, S2 normal and systolic murmur: holosystolic 2/6, blowing at apex  Extremities: extremities normal, atraumatic, no cyanosis or edema, 2+ pulses in BU&LE    Data Reviewed: Basic Metabolic Panel:  Recent Labs Lab 09/24/12 1052 09/24/12 1359 09/25/12 0639  09/26/12 0610 09/26/12 1710 09/27/12 0552 09/28/12 0350 09/29/12 0515 09/30/12 0500  NA 138  --  141  < > 142 138 139 137 137 134*  K 3.5  --  2.1*  < > 3.3* 3.5 3.5 3.3* 4.0 4.0  CL 102  --  100  < > 105 100 103 98 103 98  CO2 11*  --  19  < > 23 27 26 27 28 25   GLUCOSE 121*  --  139*  < > 129* 116* 110* 107* 102* 100*  BUN 166*  --  166*  < > 58* 55* 60* 65* 33* 50*  CREATININE 12.20*  --  11.62*  < > 5.79* 5.88* 6.44* 7.21* 4.40* 5.55*  CALCIUM 7.5*  --  7.1*  < > 7.2* 7.3* 7.4* 7.7* 7.8* 7.9*  MG  --  2.7*  --   --   --   --   --   --   --   --   PHOS  --   --  8.6*  --  3.6  --   --  3.7  --   --   < > = values in this interval not displayed.  Liver Function Tests:  Recent Labs Lab 09/24/12 1052 09/25/12 0639 09/26/12 0610 09/28/12 0350  AST 5 <5  --  13  ALT 15 12  --  <5  ALKPHOS 49 44  --  40  BILITOT 0.3 0.3  --  0.3  PROT 6.4 5.6*  --  5.5*  ALBUMIN 3.6 3.1* 3.0* 3.0*   No results found for this basename: LIPASE, AMYLASE,  in the last 168 hours No results found for this  basename: AMMONIA,  in the last 168 hours  CBC:  Recent Labs Lab 09/24/12 1052  09/26/12 0610 09/27/12 0552 09/28/12 0350 09/30/12 1728 09/30/12 2200  WBC 6.1  < > 3.9* 5.0 6.8 10.5 7.6  NEUTROABS 4.2  --   --   --   --   --   --   HGB 8.1*  < > 7.9* 7.9* 8.2* 7.4* 6.0*  HCT 22.1*  < > 21.8* 22.3*  23.5* 21.2* 17.3*  MCV 86.0  < > 84.8 88.1 89.7 90.6 89.6  PLT 136*  < > 113* 108* 110* 134* 105*  < > = values in this interval not displayed.  Cardiac Enzymes:  Recent Labs Lab 09/24/12 1359  CKTOTAL 148   BNP (last 3 results) No results found for this basename: PROBNP,  in the last 8760 hours   CBG:  Recent Labs Lab 09/30/12 0745 09/30/12 1127 09/30/12 1632 09/30/12 2131 10/01/12 0725  GLUCAP 98 90 118* 177* 110*    No results found for this or any previous visit (from the past 240 hour(s)).   Studies: Ct Abdomen Pelvis Wo Contrast  09/26/2012   *RADIOLOGY REPORT*  Clinical Data: Acute renal insufficiency.  Question retroperitoneal obstruction.  CT ABDOMEN AND PELVIS WITHOUT CONTRAST  Technique:  Multidetector CT imaging of the abdomen and pelvis was performed following the standard protocol without intravenous contrast.  Comparison: 12/02/2010.  Findings: Lung bases show no acute findings.  Heart size normal. No pericardial or pleural effusion.  Liver, gallbladder, and right adrenal gland are unremarkable.  Left adrenal gland appears minimally prominent, as before, stable. Stones are seen in the kidneys bilaterally, measuring up to 11 mm on the left.  Ureters are decompressed.  Spleen is unremarkable. Calcifications are seen in an atrophic pancreas.  No ductal dilatation.  Stomach and bowel are unremarkable.  Foley catheter is seen in a decompressed bladder.  Hysterectomy. Ovaries are visualized.  No free fluid.  Atherosclerotic calcification of the arterial vasculature without abdominal aortic aneurysm.  No pathologically enlarged lymph nodes. No worrisome lytic or  sclerotic lesions.  Right hip arthroplasty.  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  IMPRESSION:  1.  Bilateral renal stones without obstruction. 2.  Mild prominence of the left adrenal gland, stable. 3.  Chronic calcific pancreatitis. 4.  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   Original Report Authenticated By: Lorin Picket, M.D.   Dg Chest 2 View  09/29/2012   *RADIOLOGY REPORT*  Clinical Data: Dyspnea and cough  CHEST - 2 VIEW  Comparison: 10/23/2012  Findings: There is a right sided IJ catheter with tip in the distal SVC.  Heart size is normal.  No pleural effusion or edema.  No airspace consolidation identified.  IMPRESSION:  1.  No acute cardiopulmonary abnormalities.   Original Report Authenticated By: Kerby Moors, M.D.   US Renal  09/24/2012   *RADIOLOGY REPORT*  Clinical Data: Elevated creatinine  RENAL/URINARY TRACT ULTRASOUND COMPLETE  Comparison:  09/02/2012  Findings:  Right Kidney:  10.5 cm in length.  The kidney is extremely echogenic consistent with medical renal disease and stable from prior exam.  Left Kidney:  10.8 cm in length.  Increased echogenicity is noted similar to that seen on prior study.  A tiny 6 mm cyst is noted. Echogenic focus in the lower pole of the left kidney consistent with a nonobstructing stone.  This is stable from prior exam.  Bladder:  Partially decompressed  IMPRESSION: No mass lesion or hydronephrosis is noted.  Increased echogenicity is identified consistent with medical renal disease.  Stable nonobstructing left renal stone   Original Report Authenticated By: Inez Catalina, M.D.   US Renal  09/02/2012   *RADIOLOGY REPORT*  Clinical Data: Acute renal failure  RENAL/URINARY TRACT ULTRASOUND COMPLETE  Comparison:  CT virtual colonoscopy of 12/02/2010  Findings:  Right Kidney:  No hydronephrosis is seen.  The right kidney measures 10.8 cm  sagittally.  However, the parenchyma of the right kidney is markedly echogenic  consistent with chronic renal medical disease.  Left Kidney:  No hydronephrosis is noted.  The left kidney measures 10.4 cm.  The parenchyma also is very echogenic consistent with chronic renal medical disease.  Probable nonshadowing calculi are noted.  A lower pole cyst is present of 0.6 mm in diameter.  Bladder:  The urinary bladder is not distended cannot be well evaluated.  IMPRESSION:  1.  No hydronephrosis. 2.  Very echogenic renal parenchyma bilaterally consistent with chronic renal medical disease. 3.  Echogenic foci in the left kidney consistent with calculi which are not obstructing.   Original Report Authenticated By: Ivar Drape, M.D.   US Biopsy-no Radiologist  09/30/2012   CLINICAL DATA: deterding   Ultrasound was utilized for biopsy by the requesting physician.    Dg Chest Port 1 View  09/25/2012   *RADIOLOGY REPORT*  Clinical Data: Status post dialysis catheter placement  PORTABLE CHEST - 1 VIEW  Comparison: 04/24/2008  Findings: There is a right chest wall dialysis catheter with tips in the distal SVC and cavoatrial junction. No pneumothorax identified.  Normal heart size.  No pleural effusion or edema.  No airspace consolidation identified.  IMPRESSION:  1.  No complications status post dialysis catheter placement.   Original Report Authenticated By: Kerby Moors, M.D.   Dg Fluoro Guide Cv Line-no Report  09/25/2012   CLINICAL DATA: placement dialysis catheter   FLOURO GUIDE CV LINE  Fluoroscopy was utilized by the requesting physician.  No radiographic  interpretation.     Scheduled Meds: . insulin aspart  0-5 Units Subcutaneous QHS  . insulin aspart  0-9 Units Subcutaneous TID WC  . levothyroxine  125 mcg Oral QAC breakfast  . LORazepam  4 mg Oral Once  . multivitamin  1 tablet Oral Daily  . pantoprazole  40 mg Oral Daily  . sevelamer carbonate  4.8 g Oral TID WC  . sodium chloride  3 mL Intravenous Q12H  . zolpidem  5 mg Oral QHS   Continuous Infusions: . sodium chloride 10  mL/hr at 09/30/12 0619  . sodium chloride 10 mL/hr (09/30/12 1400)  . sodium chloride 1,000 mL (09/30/12 2046)    Active Problems:   HYPERTENSION, BENIGN   Breast cancer   Anemia, unspecified   DM2 (diabetes mellitus, type 2)   AKI (acute kidney injury)    Time spent: 40 minutes   [facility] Pager [phone]. If 8PM-8AM, please contact night-coverage at [email or web address], password [facility] 10/01/2012, 8:53 AM  LOS: 7 days

## 2012-10-01 NOTE — Progress Notes (Signed)
Call placed to to Dr. Allyson Sabal, informed patient continue to bleed from foley, having to irrigate every two hour to keep foley tubing from clotting.  New order received.

## 2012-10-01 NOTE — Progress Notes (Signed)
I have personally seen and examined this patient and agree with the assessment/plan as outlined above by Alice Rieger MD (PGY2). 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 Had some post-biopsy hematuria with Hgb drop requiring PRBCs. Await labs from today to decide on need for HD.  Anita Pollard K.,MD 10/01/2012 10:35 AM

## 2012-10-02 ENCOUNTER — Inpatient Hospital Stay (HOSPITAL_COMMUNITY): Payer: Medicare Other

## 2012-10-02 LAB — GLUCOSE, CAPILLARY
Glucose-Capillary: 118 mg/dL — ABNORMAL HIGH (ref 70–99)
Glucose-Capillary: 131 mg/dL — ABNORMAL HIGH (ref 70–99)
Glucose-Capillary: 110 mg/dL — ABNORMAL HIGH (ref 70–99)

## 2012-10-02 LAB — BASIC METABOLIC PANEL
BUN: 63 mg/dL — ABNORMAL HIGH (ref 6–23)
Calcium: 8.4 mg/dL (ref 8.4–10.5)
Creatinine, Ser: 6.06 mg/dL — ABNORMAL HIGH (ref 0.50–1.10)
GFR calc non Af Amer: 6 mL/min — ABNORMAL LOW (ref >90)
Glucose, Bld: 120 mg/dL — ABNORMAL HIGH (ref 70–99)
Potassium: 3.8 mEq/L (ref 3.5–5.1)

## 2012-10-02 LAB — CBC
HCT: 24 % — ABNORMAL LOW (ref 36.0–46.0)
MCH: 30.5 pg (ref 26.0–34.0)
MCV: 87.3 fL (ref 78.0–100.0)
RDW: 13 % (ref 11.5–15.5)
WBC: 6.1 10*3/uL (ref 4.0–10.5)

## 2012-10-02 LAB — TYPE AND SCREEN
ABO/RH(D): A POS
Antibody Screen: NEGATIVE
Unit division: 0
Unit division: 0

## 2012-10-02 MED ORDER — FUROSEMIDE 10 MG/ML IJ SOLN
40.0000 mg | Freq: Once | INTRAMUSCULAR | Status: AC
  Administered 2012-10-02: 40 mg via INTRAVENOUS
  Filled 2012-10-02: qty 4

## 2012-10-02 MED ORDER — AMLODIPINE BESYLATE 10 MG PO TABS
10.0000 mg | ORAL_TABLET | Freq: Every day | ORAL | Status: DC
  Administered 2012-10-02 – 2012-10-07 (×6): 10 mg via ORAL
  Filled 2012-10-02 (×6): qty 1

## 2012-10-02 MED ORDER — SODIUM CHLORIDE 0.9 % IV SOLN
20.0000 ug | Freq: Once | INTRAVENOUS | Status: AC
  Administered 2012-10-02: 20 ug via INTRAVENOUS
  Filled 2012-10-02: qty 5

## 2012-10-02 MED ORDER — TAMSULOSIN HCL 0.4 MG PO CAPS
0.4000 mg | ORAL_CAPSULE | Freq: Every day | ORAL | Status: DC
  Administered 2012-10-02 – 2012-10-06 (×5): 0.4 mg via ORAL
  Filled 2012-10-02 (×6): qty 1

## 2012-10-02 NOTE — Progress Notes (Signed)
Subjective: Continues to have hematuria with continuous bladder irrigation.  Has PRBC transfusion last night.  No abdominal pain.  Objective: Vital signs in last 24 hours: Temp:  [97.2 F (36.2 C)-98.8 F (37.1 C)] 98.8 F (37.1 C) (07/09 0521) Pulse Rate:  [56-69] 63 (07/09 0521) Resp:  [16-18] 18 (07/09 0521) BP: (135-170)/(52-72) 170/70 mmHg (07/09 0521) SpO2:  [95 %-100 %] 95 % (07/09 0521) Weight:  [133 lb 12.8 oz (60.691 kg)] 133 lb 12.8 oz (60.691 kg) (07/08 2140) Weight change: 1 lb 6.4 oz (0.635 kg)  Intake/Output from previous day: 07/08 0701 - 07/09 0700 In: 1193.3 [P.O.:535; I.V.:250; Blood:408.3] Out: 6900 [Urine:6900] Intake/Output this shift:   General appearance: alert, cooperative and pale. Resp: clear to auscultation bilaterally. No increased work of breathing. Abdomen: Normal fullness, normal BS, non-tender, no rebound of guarding tenderness. Left bx site no bleeding. Pink fluid from bladder on Continuous bladder irrigation Cardio: S1, S2 normal and systolic murmur: holosystolic 2/6, blowing at apex Extremities: extremities normal, atraumatic, no cyanosis or edema, 2+ pulses in BU&LE  Lab Results:  Recent Labs  10/01/12 1502 10/02/12 0645  WBC 6.3 6.1  HGB 7.3* 8.4*  HCT 20.6* 24.0*  PLT 105* 98*   BMET:   Recent Labs  10/01/12 1502 10/02/12 0645  NA 130* 130*  K 3.9 3.8  CL 93* 95*  CO2 25 23  GLUCOSE 159* 120*  BUN 62* 63*  CREATININE 5.98* 6.06*  CALCIUM 8.1* 8.4    Studies/Results: Ct Abdomen Pelvis Wo Contrast  10/01/2012   *RADIOLOGY REPORT*  Clinical Data: Hematuria status post biopsy.  CT ABDOMEN AND PELVIS WITHOUT CONTRAST  Technique:  Multidetector CT imaging of the abdomen and pelvis was performed following the standard protocol without intravenous contrast.  Comparison: Ultrasound guidance images performed today.  CT 09/26/2012.  Findings: There is a small amount of perinephric stranding around the mid and lower pole of the left  kidney, likely small hematoma associated with today's renal biopsy.  No significant subcapsular or perinephric hematoma.  Again noted is the 11 mm renal pelvic stone and other nonobstructing stones in the kidneys bilaterally. No hydronephrosis.  Ureters are decompressed.  Foley catheter is in place.  Small amount of air in the urinary bladder.  There are small bilateral pleural effusions and bibasilar atelectasis, new since prior study.  Liver, spleen, adrenals and stomach have an unremarkable unenhanced appearance.  Gallbladder is contracted.  Extensive calcifications throughout the atrophic pancreas compatible with chronic pancreatitis.  Extensive descending colonic and sigmoid diverticulosis.  No active diverticulitis.  Small bowel is decompressed.  No free fluid, free air or adenopathy.  Changes of prior right hip replacement on.  No acute bony abnormality.  IMPRESSION: Slight stranding along the mid and lower poles of the left kidney compatible with slight hematoma from today's renal biopsy.  No significant subcapsular or perinephric hematoma.  Bilateral nephrolithiasis.  11 mm left renal pelvic stone.  No evidence of obstruction.  New small bilateral pleural effusions and bibasilar atelectasis.   Original Report Authenticated By: Rolm Baptise, M.D.   US Biopsy-no Radiologist  09/30/2012   CLINICAL DATA: deterding   Ultrasound was utilized for biopsy by the requesting physician.     I have reviewed the patient's current medications.  Assessment/Plan:  # AKI/CKD: Had HD on7/2 and 7/6 2/2 increasing Cr. 4.4 >>5.55. ?Infiltrative with elevated free light chains.  Renal biopsy 7/7. Elevated light chains on SPEP.  Plan  - No acute need for HD today.  #  Hematuria: S/p left renal biopsy. CT abdominal last night - no significant hematoma formation. On cont bladder irrigation with clearing. Given desmopressin ;last night.  # Anemia. Related to acute blood loss from renal bx in setting of chronic anemia  related to CKD. Hgb 7.4 >>6.0. Has required 2 units for PRBC. Hgb 7.3 >8.4. FOBT negative Plan  - CBC q12hrs  - Hgb goal >7 - Monitor hematuria  #Thrombocytopenia, stable as of 7/5. Abnormal platelet function. Desmopressin pre-biopsy for prevention of bleeding. #HTN: BP elevated today. Start Amlodipine 10 mg #SOB and Cough: Improved.   LOS: 8 days   Signed:  Jessee Avers, MD PGY-5 Internal Medicine Teaching Service Pager: [phone] 10/02/2012, 8:37 AM

## 2012-10-02 NOTE — Progress Notes (Signed)
I have personally seen and examined this patient and agree with the assessment/plan as outlined above by Alice Rieger MD (PGY2). Hematuria continues post biopsy but appears to be slowing down. Now s/p a second unit of PRBC overnight and IV DDAVP. No indication for HD at this time- suspect hyponatremia is from DDAVP. Karolynn Infantino K.,MD 10/02/2012 10:23 AM

## 2012-10-02 NOTE — Progress Notes (Addendum)
[facility] PROGRESS NOTE  KRISTASIA ENKE [id number] DOB: [birth year] DOA: 09/24/2012 PCP: Lawerance Cruel, MD  Assessment/Plan: Active Problems:   HYPERTENSION, BENIGN   Breast cancer   Anemia, unspecified   DM2 (diabetes mellitus, type 2)   AKI (acute kidney injury)      Interval summary  72 year old female with a hx of HTN, Ca breast cancer,DM diet controlled, hypothyroidism presented to the ED after receiving a call from her nephrologist that her creatinine has significantly worsened since May 2014. In ER, creatinine was 12.20. The patient saw her PCP Dr. Lawerance Cruel, and creatinine was 2.34 on 08/12/2012 as part of routine blood work. Labs redrawn on 08/22/2012 and her serum creatinine was noted to be 2.7.  The patient was referred to nephrology and saw Dr. Shirl Harris on 08/29/12. UA was bland but Cr was 4.2 on 08/29/2012. On 06/12, the patient had a serum creatinine of 6.41. Labs on 09/23/2012 showed a serum creatinine of 11.27 which prompted a call to have the patient go to the ED. A repeat renal ultrasound on 09/24/12 was negative for hydronephrosis. The patient denied any NSAID use however she takes a number of OTC supplements.  Nephrology was consulted and started the renal workup. ASO, hepatitis panel are negative, C3 slightly low. Patient has elevated urine free light chains which favour infiltrative renal disease, hence the renal biopsy is planned on Monday.  Vascular surgery was also consulted and patient underwent hemodialysis catheter placement on 7/2. Patient underwent first hemodialysis on 09/25/12, then 7/5, still unclear if she is going to need permanent HD.  Consults:  Nephrology: Dr. Jimmy Footman  Vascular surgery: Dr Kellie Simmering    Assessment/Plan:   Hematuria Started on continuous bladder irrigation Discussed with Dr. Posey Pronto nephrology last night Patient received desmopressin IV last night Hyponatremia and hypertension as side effects of desmopressin  and the patient is clearly demonstrating that Has received 2 units of packed red blood cells No evidence of perinephric hematoma Started on gentle IV hydration after discussion with nephrology   Acute kidney injury - unclear etiology, with uremia, metabolic acidosis, hypokalemia.  - renal consulted, workup in progress, dialysis catheter placed, started on HD 09/25/12 x 1, second HD on 7/5.  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  Renal biopsy done  PFA not done as sample hemo lysed prior to renal biopsy Developed hematuria yesterday after renal biopsy, has a Foley in place because of retention     Shortness of breath: Unclear in etiology, states started during dialysis yesterday, has no hypoxia, sats 96% on room air, lung examination clear  - Started back on her Protonix, chest x-ray reveals no pulmonary edema or pneumonia,  Echo shows EF of 65-70%  PA pressure of 47 mm    Hypertension  - Currently stable  Hypothyroidism  -TSH 0.163, reduced home Synthroid dose to 147mcg  Diabetes mellitus2 : Diet controlled at home  -hemoglobin A1c 6.8 in 09/25/12,  - Placed on NovoLog sliding-scale qac and hs  Metabolic acidosis - improved, bicarbonate drip dc'd  history of Right breast cancer  -Dr. Jana Hakim is oncologist, in remission  Normocytic anemia now with gross hematuria status  post renal biopsy -Suspect due to renal disease, repeat anemia panel also shows iron level of 188, ferritin 196, TIBC 214  - Hemoglobin 6.0, FOBT negative, s/p 2 unit packed RBC on 7/2 , 7/7  Monitor CBC every 8 hours   History of hemorrhoids: placed on hydrocortisone supp per request from patient    HPI/Subjective:   Started on continuous bladder irrigation,   Objective: Filed Vitals:   10/02/12 0145 10/02/12 0240 10/02/12 0245 10/02/12 0521  BP: 135/62 156/70 163/68 170/70  Pulse: 64 59 59 63  Temp: 98.8 F (37.1 C) 98.2 F (36.8 C) 98.8 F (37.1 C) 98.8 F (37.1 C)  TempSrc: Oral Oral Oral Oral  Resp: 18 18 18 18   Height:      Weight:      SpO2:    95%    Intake/Output Summary (Last 24 hours) at 10/02/12 0858 Last data filed at 10/02/12 0855  Gross per 24 hour  Intake 1193.33 ml  Output  10750 ml  Net -9556.67 ml    Exam: General appearance: alert, cooperative and pale.  Resp: clear to auscultation bilaterally. No increased work of breathing.  Abdomen: Normal fullness, normal BS, non-tender, no rebound of guarding tenderness. Left bx site no bleeding. Pink fluid from bladder on Continuous bladder irrigation  Cardio: S1, S2 normal and systolic murmur: holosystolic 2/6, blowing at apex  Extremities: extremities normal, atraumatic, no cyanosis or edema, 2+ pulses in BU&LE   Data Reviewed: Basic Metabolic Panel:  Recent Labs Lab 09/26/12 0610  09/28/12 0350 09/29/12 0515 09/30/12 0500 10/01/12 1502 10/02/12 0645  NA 142  < > 137 137 134* 130* 130*  K 3.3*  < > 3.3* 4.0 4.0 3.9 3.8  CL 105  < > 98 103 98 93* 95*  CO2 23  < > 27 28 25 25 23   GLUCOSE 129*  < > 107* 102* 100* 159* 120*  BUN 58*  < > 65* 33* 50* 62* 63*  CREATININE 5.79*  < > 7.21* 4.40* 5.55* 5.98* 6.06*  CALCIUM 7.2*  < > 7.7* 7.8* 7.9* 8.1* 8.4  PHOS 3.6  --  3.7  --   --   --   --   < > = values in this interval not displayed.  Liver Function Tests:  Recent Labs Lab 09/26/12 0610 09/28/12 0350  AST  --  13  ALT  --  <5  ALKPHOS  --  40  BILITOT  --  0.3  PROT  --  5.5*  ALBUMIN 3.0* 3.0*   No results found for this basename: LIPASE, AMYLASE,  in the last 168 hours No results found for this  basename: AMMONIA,  in the last 168 hours  CBC:  Recent Labs Lab 09/30/12 1728 09/30/12 2200 10/01/12 0818 10/01/12 1502 10/02/12 0645  WBC 10.5 7.6 6.8 6.3 6.1  HGB 7.4* 6.0* 8.0* 7.3* 8.4*  HCT 21.2* 17.3* 22.1* 20.6* 24.0*  MCV 90.6 89.6 88.0 88.4 87.3  PLT 134* 105* 105* 105* 98*    Cardiac Enzymes: No results found for this basename: CKTOTAL, CKMB, CKMBINDEX, TROPONINI,  in the last 168 hours BNP (last 3 results) No results found for this basename: PROBNP,  in the last 8760 hours   CBG:  Recent Labs Lab 10/01/12 0725 10/01/12 1130 10/01/12 1645 10/01/12 2134 10/02/12 0750  GLUCAP 110* 174* 115* 102* 118*    No results found for this or any previous visit (from the past 240 hour(s)).   Studies: Ct Abdomen  Pelvis Wo Contrast  10/01/2012   *RADIOLOGY REPORT*  Clinical Data: Hematuria status post biopsy.  CT ABDOMEN AND PELVIS WITHOUT CONTRAST  Technique:  Multidetector CT imaging of the abdomen and pelvis was performed following the standard protocol without intravenous contrast.  Comparison: Ultrasound guidance images performed today.  CT 09/26/2012.  Findings: There is a small amount of perinephric stranding around the mid and lower pole of the left kidney, likely small hematoma associated with today's renal biopsy.  No significant subcapsular or perinephric hematoma.  Again noted is the 11 mm renal pelvic stone and other nonobstructing stones in the kidneys bilaterally. No hydronephrosis.  Ureters are decompressed.  Foley catheter is in place.  Small amount of air in the urinary bladder.  There are small bilateral pleural effusions and bibasilar atelectasis, new since prior study.  Liver, spleen, adrenals and stomach have an unremarkable unenhanced appearance.  Gallbladder is contracted.  Extensive calcifications throughout the atrophic pancreas compatible with chronic pancreatitis.  Extensive descending colonic and sigmoid diverticulosis.  No active diverticulitis.  Small  bowel is decompressed.  No free fluid, free air or adenopathy.  Changes of prior right hip replacement on.  No acute bony abnormality.  IMPRESSION: Slight stranding along the mid and lower poles of the left kidney compatible with slight hematoma from today's renal biopsy.  No significant subcapsular or perinephric hematoma.  Bilateral nephrolithiasis.  11 mm left renal pelvic stone.  No evidence of obstruction.  New small bilateral pleural effusions and bibasilar atelectasis.   Original Report Authenticated By: Rolm Baptise, M.D.   Ct Abdomen Pelvis Wo Contrast  09/26/2012   *RADIOLOGY REPORT*  Clinical Data: Acute renal insufficiency.  Question retroperitoneal obstruction.  CT ABDOMEN AND PELVIS WITHOUT CONTRAST  Technique:  Multidetector CT imaging of the abdomen and pelvis was performed following the standard protocol without intravenous contrast.  Comparison: 12/02/2010.  Findings: Lung bases show no acute findings.  Heart size normal. No pericardial or pleural effusion.  Liver, gallbladder, and right adrenal gland are unremarkable.  Left adrenal gland appears minimally prominent, as before, stable. Stones are seen in the kidneys bilaterally, measuring up to 11 mm on the left.  Ureters are decompressed.  Spleen is unremarkable. Calcifications are seen in an atrophic pancreas.  No ductal dilatation.  Stomach and bowel are unremarkable.  Foley catheter is seen in a decompressed bladder.  Hysterectomy. Ovaries are visualized.  No free fluid.  Atherosclerotic calcification of the arterial vasculature without abdominal aortic aneurysm.  No pathologically enlarged lymph nodes. No worrisome lytic or sclerotic lesions.  Right hip arthroplasty.  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  IMPRESSION:  1.  Bilateral renal stones without obstruction. 2.  Mild prominence of the left adrenal gland, stable. 3.  Chronic calcific pancreatitis. 4.  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    Original Report Authenticated By: Lorin Picket, M.D.   Dg Chest 2 View  09/29/2012   *RADIOLOGY REPORT*  Clinical Data: Dyspnea and cough  CHEST - 2 VIEW  Comparison: 10/23/2012  Findings: There is a right sided IJ catheter with tip in the distal SVC.  Heart size is normal.  No pleural effusion or edema.  No airspace consolidation identified.  IMPRESSION:  1.  No acute cardiopulmonary abnormalities.   Original Report Authenticated By: Kerby Moors, M.D.   US Renal  09/24/2012   *RADIOLOGY REPORT*  Clinical Data: Elevated creatinine  RENAL/URINARY TRACT ULTRASOUND COMPLETE  Comparison:  09/02/2012  Findings:  Right Kidney:  10.5 cm in length.  The kidney is extremely echogenic consistent with medical renal disease and stable from prior exam.  Left Kidney:  10.8 cm in length.  Increased echogenicity is noted similar to that seen on prior study.  A tiny 6 mm cyst is noted. Echogenic focus in the lower pole of the left kidney consistent with a nonobstructing stone.  This is stable from prior exam.  Bladder:  Partially decompressed  IMPRESSION: No mass lesion or hydronephrosis is noted.  Increased echogenicity is identified consistent with medical renal disease.  Stable nonobstructing left renal stone   Original Report Authenticated By: Inez Catalina, M.D.   US Renal  09/02/2012   *RADIOLOGY REPORT*  Clinical Data: Acute renal failure  RENAL/URINARY TRACT ULTRASOUND COMPLETE  Comparison:  CT virtual colonoscopy of 12/02/2010  Findings:  Right Kidney:  No hydronephrosis is seen.  The right kidney measures 10.8 cm sagittally.  However, the parenchyma of the right kidney is markedly echogenic consistent with chronic renal medical disease.  Left Kidney:  No hydronephrosis is noted.  The left kidney measures 10.4 cm.  The parenchyma also is very echogenic consistent with chronic renal medical disease.  Probable nonshadowing calculi are noted.  A lower pole cyst is present of 0.6 mm in diameter.  Bladder:  The urinary  bladder is not distended cannot be well evaluated.  IMPRESSION:  1.  No hydronephrosis. 2.  Very echogenic renal parenchyma bilaterally consistent with chronic renal medical disease. 3.  Echogenic foci in the left kidney consistent with calculi which are not obstructing.   Original Report Authenticated By: Ivar Drape, M.D.   US Biopsy-no Radiologist  09/30/2012   CLINICAL DATA: deterding   Ultrasound was utilized for biopsy by the requesting physician.    Dg Chest Port 1 View  09/25/2012   *RADIOLOGY REPORT*  Clinical Data: Status post dialysis catheter placement  PORTABLE CHEST - 1 VIEW  Comparison: 04/24/2008  Findings: There is a right chest wall dialysis catheter with tips in the distal SVC and cavoatrial junction. No pneumothorax identified.  Normal heart size.  No pleural effusion or edema.  No airspace consolidation identified.  IMPRESSION:  1.  No complications status post dialysis catheter placement.   Original Report Authenticated By: Kerby Moors, M.D.   Dg Fluoro Guide Cv Line-no Report  09/25/2012   CLINICAL DATA: placement dialysis catheter   FLOURO GUIDE CV LINE  Fluoroscopy was utilized by the requesting physician.  No radiographic  interpretation.     Scheduled Meds: . amLODipine  10 mg Oral Daily  . insulin aspart  0-5 Units Subcutaneous QHS  . insulin aspart  0-9 Units Subcutaneous TID WC  . levothyroxine  125 mcg Oral QAC breakfast  . LORazepam  4 mg Oral Once  . multivitamin  1 tablet Oral Daily  . pantoprazole  40 mg Oral Daily  . sevelamer carbonate  4.8 g Oral TID WC  . sodium chloride  3 mL Intravenous Q12H  . zolpidem  5 mg Oral QHS   Continuous Infusions: . sodium chloride 10 mL/hr at 09/30/12 0619  . sodium chloride 10 mL/hr (09/30/12 1400)  . sodium chloride 1,000 mL (09/30/12 2046)  . sodium chloride 50 mL (10/02/12 0003)    Active Problems:   HYPERTENSION, BENIGN   Breast cancer   Anemia, unspecified   DM2 (diabetes mellitus, type 2)   AKI (acute  kidney injury)    Time spent: 33 minutes   [facility]  Pager [phone]. If 8PM-8AM, please contact night-coverage at [email or web address], password [facility] 10/02/2012, 8:58 AM  LOS: 8 days

## 2012-10-02 NOTE — Progress Notes (Signed)
Pt c/o SOB DR Posey Pronto notified see physicians orders will cont to [facility]

## 2012-10-03 ENCOUNTER — Inpatient Hospital Stay (HOSPITAL_COMMUNITY): Payer: Medicare Other

## 2012-10-03 LAB — BASIC METABOLIC PANEL
CO2: 25 mEq/L (ref 19–32)
Chloride: 91 mEq/L — ABNORMAL LOW (ref 96–112)
Creatinine, Ser: 6.31 mg/dL — ABNORMAL HIGH (ref 0.50–1.10)
GFR calc Af Amer: 7 mL/min — ABNORMAL LOW (ref >90)
Potassium: 3.6 mEq/L (ref 3.5–5.1)

## 2012-10-03 LAB — TROPONIN I: Troponin I: <0.3 ng/mL (ref <0.30)

## 2012-10-03 LAB — GLUCOSE, CAPILLARY: Glucose-Capillary: 141 mg/dL — ABNORMAL HIGH (ref 70–99)

## 2012-10-03 MED ORDER — FUROSEMIDE 80 MG PO TABS
80.0000 mg | ORAL_TABLET | Freq: Once | ORAL | Status: AC
  Administered 2012-10-03: 80 mg via ORAL
  Filled 2012-10-03: qty 1

## 2012-10-03 MED ORDER — TECHNETIUM TO 99M ALBUMIN AGGREGATED
6.0000 | Freq: Once | INTRAVENOUS | Status: AC | PRN
  Administered 2012-10-03: 6 via INTRAVENOUS

## 2012-10-03 NOTE — Progress Notes (Signed)
[facility] PROGRESS NOTE  JISEL WINTJEN [id number] DOB: [birth year] DOA: 09/24/2012 PCP: Lawerance Cruel, MD  Assessment/Plan: Active Problems:   HYPERTENSION, BENIGN   Breast cancer   Anemia, unspecified   DM2 (diabetes mellitus, type 2)   AKI (acute kidney injury)    *Interval summary  72 year old female with a hx of HTN, Ca breast cancer,DM diet controlled, hypothyroidism presented to the ED after receiving a call from her nephrologist that her creatinine has significantly worsened since May 2014. In ER, creatinine was 12.20. The patient saw her PCP Dr. Lawerance Cruel, and creatinine was 2.34 on 08/12/2012 as part of routine blood work. Labs redrawn on 08/22/2012 and her serum creatinine was noted to be 2.7.  The patient was referred to nephrology and saw Dr. Shirl Harris on 08/29/12. UA was bland but Cr was 4.2 on 08/29/2012. On 06/12, the patient had a serum creatinine of 6.41. Labs on 09/23/2012 showed a serum creatinine of 11.27 which prompted a call to have the patient go to the ED. A repeat renal ultrasound on 09/24/12 was negative for hydronephrosis. The patient denied any NSAID use however she takes a number of OTC supplements.  Nephrology was consulted and started the renal workup. ASO, hepatitis panel are negative, C3 slightly low. Patient has elevated urine free light chains which favour infiltrative renal disease, hence the renal biopsy is planned on Monday.  Vascular surgery was also consulted and patient underwent hemodialysis catheter placement on 7/2. Patient underwent first hemodialysis on 09/25/12, then 7/5, still unclear if she is going to need permanent HD.   Consults:  Nephrology: Dr. Jimmy Footman  Vascular surgery: Dr Kellie Simmering   Assessment/Plan:  Hematuria status post renal biopsy  Started on continuous bladder irrigation  Discussed with Dr. Posey Pronto nephrology  Patient received desmopressin IV pre-and post renal biopsy Hyponatremia and hypertension as  side effects of desmopressin and the patient is clearly demonstrating that  Has received 2 units of packed red blood cells  No evidence of perinephric hematoma  IV fluids discontinued because of increasing shortness of breath Received one dose of Lasix last night because of shortness of breath Discharge plans pending resolution of hematuria   Acute kidney injury - unclear etiology, with uremia, metabolic acidosis, hypokalemia.  - renal consulted, workup in progress, dialysis catheter placed, started on HD 09/25/12 x 1, second HD on 7/5.  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  Renal biopsy done  PFA not done as sample hemo lysed prior to renal biopsy  Developed hematuria yesterday after renal biopsy, has a Foley in place because of retention    Shortness of breath: Likely increasing pulmonary edema from IV fluids and blood, on 2 L of oxygen, VQ scan negative Echo shows EF of 65-70%  PA pressure of 47 mm    Hypertension  - Currently stable  Hypothyroidism  -TSH 0.163, reduced home Synthroid dose to 172mcg  Diabetes mellitus2 : Diet controlled at home  -hemoglobin A1c 6.8 in 09/25/12,  - Placed on NovoLog sliding-scale qac and hs  Metabolic acidosis - improved, bicarbonate drip dc'd  history of Right breast cancer  -Dr. Jana Hakim is oncologist, in remission  Normocytic anemia now with gross hematuria status post renal biopsy -Suspect due to renal disease, repeat anemia panel also shows iron level of 188, ferritin 196, TIBC 214  - Hemoglobin 6.0, FOBT negative,  History of hemorrhoids: placed on  hydrocortisone supp per request from patient    HPI/Subjective:   Started on continuous bladder irrigation is still experiencing intermittent abdominal pain from obstruction by the blood clots   Objective: Filed Vitals:   10/02/12 1342 10/02/12 1741 10/02/12 2021 10/03/12 0453  BP: 171/58 165/56 135/55 139/55  Pulse: 65 65 71 70  Temp: 98.4 F (36.9 C) 98.6 F (37 C) 99.2 F (37.3 C) 98.4 F (36.9 C)  TempSrc: Oral Oral Oral Oral  Resp: 18 17 18 18   Height:      Weight:   60.7 kg (133 lb 13.1 oz)   SpO2: 95% 96% 97% 98%    Intake/Output Summary (Last 24 hours) at 10/03/12 0739 Last data filed at 10/03/12 0641  Gross per 24 hour  Intake   5096 ml  Output   9430 ml  Net  -4334 ml    Exam:  HENT:  Head: Atraumatic.  Nose: Nose normal.  Mouth/Throat: Oropharynx is clear and moist.  Eyes: Conjunctivae are normal. Pupils are equal, round, and reactive to light. No scleral icterus.  Neck: Neck supple. No tracheal deviation present.  Cardiovascular: Normal rate, regular rhythm, normal heart sounds and intact distal pulses.  Pulmonary/Chest: Effort normal and breath sounds normal. No respiratory distress.  Abdominal: Soft. Normal appearance and bowel sounds are normal. She exhibits no distension. There is no tenderness.  Musculoskeletal: She exhibits no edema and no tenderness.  Neurological: She is alert. No cranial nerve deficit.    Data Reviewed: Basic Metabolic Panel:  Recent Labs Lab 09/28/12 0350 09/29/12 0515 09/30/12 0500 10/01/12 1502 10/02/12 0645  NA 137 137 134* 130* 130*  K 3.3* 4.0 4.0 3.9 3.8  CL 98 103 98 93* 95*  CO2 27 28 25 25 23   GLUCOSE 107* 102* 100* 159* 120*  BUN 65* 33* 50* 62* 63*  CREATININE 7.21* 4.40* 5.55* 5.98* 6.06*  CALCIUM 7.7* 7.8* 7.9* 8.1* 8.4  PHOS 3.7  --   --   --   --     Liver Function Tests:  Recent Labs Lab 09/28/12 0350  AST 13  ALT <5  ALKPHOS 40  BILITOT 0.3  PROT 5.5*  ALBUMIN 3.0*   No results  found for this basename: LIPASE, AMYLASE,  in the last 168 hours No results found for this basename: AMMONIA,  in the last 168 hours  CBC:  Recent Labs Lab 09/30/12 1728 09/30/12 2200 10/01/12 0818 10/01/12 1502 10/02/12 0645 10/02/12 1235 10/02/12 2310  WBC 10.5 7.6 6.8 6.3 6.1  --   --   HGB 7.4* 6.0* 8.0* 7.3* 8.4* 9.3* 8.7*  HCT 21.2* 17.3* 22.1* 20.6* 24.0* 26.8* 24.9*  MCV 90.6 89.6 88.0 88.4 87.3  --   --   PLT 134* 105* 105* 105* 98*  --   --     Cardiac Enzymes:  Recent Labs Lab 10/02/12 2310  TROPONINI <0.30   BNP (last 3 results) No results found for this basename: PROBNP,  in the last 8760 hours   CBG:  Recent Labs Lab 10/01/12 2134 10/02/12 0750 10/02/12 1223 10/02/12 1740 10/02/12 2034  GLUCAP 102* 118* 131* 110* 140*    No results found for this or any previous visit (from the past 240 hour(s)).   Studies:  Ct Abdomen Pelvis Wo Contrast  10/01/2012   *RADIOLOGY REPORT*  Clinical Data: Hematuria status post biopsy.  CT ABDOMEN AND PELVIS WITHOUT CONTRAST  Technique:  Multidetector CT imaging of the abdomen and pelvis was performed following the standard protocol without intravenous contrast.  Comparison: Ultrasound guidance images performed today.  CT 09/26/2012.  Findings: There is a small amount of perinephric stranding around the mid and lower pole of the left kidney, likely small hematoma associated with today's renal biopsy.  No significant subcapsular or perinephric hematoma.  Again noted is the 11 mm renal pelvic stone and other nonobstructing stones in the kidneys bilaterally. No hydronephrosis.  Ureters are decompressed.  Foley catheter is in place.  Small amount of air in the urinary bladder.  There are small bilateral pleural effusions and bibasilar atelectasis, new since prior study.  Liver, spleen, adrenals and stomach have an unremarkable unenhanced appearance.  Gallbladder is contracted.  Extensive calcifications throughout the atrophic  pancreas compatible with chronic pancreatitis.  Extensive descending colonic and sigmoid diverticulosis.  No active diverticulitis.  Small bowel is decompressed.  No free fluid, free air or adenopathy.  Changes of prior right hip replacement on.  No acute bony abnormality.  IMPRESSION: Slight stranding along the mid and lower poles of the left kidney compatible with slight hematoma from today's renal biopsy.  No significant subcapsular or perinephric hematoma.  Bilateral nephrolithiasis.  11 mm left renal pelvic stone.  No evidence of obstruction.  New small bilateral pleural effusions and bibasilar atelectasis.   Original Report Authenticated By: Rolm Baptise, M.D.   Ct Abdomen Pelvis Wo Contrast  09/26/2012   *RADIOLOGY REPORT*  Clinical Data: Acute renal insufficiency.  Question retroperitoneal obstruction.  CT ABDOMEN AND PELVIS WITHOUT CONTRAST  Technique:  Multidetector CT imaging of the abdomen and pelvis was performed following the standard protocol without intravenous contrast.  Comparison: 12/02/2010.  Findings: Lung bases show no acute findings.  Heart size normal. No pericardial or pleural effusion.  Liver, gallbladder, and right adrenal gland are unremarkable.  Left adrenal gland appears minimally prominent, as before, stable. Stones are seen in the kidneys bilaterally, measuring up to 11 mm on the left.  Ureters are decompressed.  Spleen is unremarkable. Calcifications are seen in an atrophic pancreas.  No ductal dilatation.  Stomach and bowel are unremarkable.  Foley catheter is seen in a decompressed bladder.  Hysterectomy. Ovaries are visualized.  No free fluid.  Atherosclerotic calcification of the arterial vasculature without abdominal aortic aneurysm.  No pathologically enlarged lymph nodes. No worrisome lytic or sclerotic lesions.  Right hip arthroplasty.  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  IMPRESSION:  1.  Bilateral renal stones without obstruction. 2.  Mild  prominence of the left adrenal gland, stable. 3.  Chronic calcific pancreatitis. 4.  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   Original Report Authenticated By: Lorin Picket, M.D.   Dg Chest 2 View  09/29/2012   *RADIOLOGY REPORT*  Clinical Data: Dyspnea and cough  CHEST - 2 VIEW  Comparison: 10/23/2012  Findings: There is a right sided IJ catheter with tip in the distal SVC.  Heart size is normal.  No pleural effusion or edema.  No airspace consolidation identified.  IMPRESSION:  1.  No acute cardiopulmonary abnormalities.   Original Report Authenticated By: Kerby Moors, M.D.   Nm Pulmonary Perfusion  10/03/2012   *RADIOLOGY REPORT*  Clinical Data: Shortness of breath.  NM PULMONARY PERFUSION PARTICULATE  Radiopharmaceutical:  6MILLI CURIE MAA TECHNETIUM TO 25M ALBUMIN AGGREGATED .  The patient refused ventilation portion of the study.  Comparison: Chest x-ray earlier today.  Findings: There are no segmental or subsegmental perfusion defects in the lungs.  Patchy nonsegmental perfusion defects, likely related to obstructive airways disease.  IMPRESSION: Low probability study for pulmonary embolus.   Original Report Authenticated By: Rolm Baptise, M.D.   US Renal  09/24/2012   *RADIOLOGY REPORT*  Clinical Data: Elevated creatinine  RENAL/URINARY TRACT ULTRASOUND COMPLETE  Comparison:  09/02/2012  Findings:  Right Kidney:  10.5 cm in length.  The kidney is extremely echogenic consistent with medical renal disease and stable from prior exam.  Left Kidney:  10.8 cm in length.  Increased echogenicity is noted similar to that seen on prior study.  A tiny 6 mm cyst is noted. Echogenic focus in the lower pole of the left kidney consistent with a nonobstructing stone.  This is stable from prior exam.  Bladder:  Partially decompressed  IMPRESSION: No mass lesion or hydronephrosis is noted.  Increased echogenicity is identified consistent with medical renal disease.  Stable nonobstructing left  renal stone   Original Report Authenticated By: Inez Catalina, M.D.   US Biopsy-no Radiologist  09/30/2012   CLINICAL DATA: deterding   Ultrasound was utilized for biopsy by the requesting physician.    Dg Chest Port 1 View  10/02/2012   *RADIOLOGY REPORT*  Clinical Data: Shortness of breath, chest pain.  PORTABLE CHEST - 1 VIEW  Comparison: 09/29/2012  Findings: Increasing density in the lung bases bilaterally, right greater than left.  This could represent asymmetric edema or infection.  Mild interstitial prominence and peribronchial thickening.  Heart is normal size.  Right dialysis catheters unchanged.  No visible effusions.  IMPRESSION: Increasing bilateral lower lung airspace opacities, right slightly greater than left as well as interstitial prominence.  Findings could represent edema or infection.   Original Report Authenticated By: Rolm Baptise, M.D.   Dg Chest Port 1 View  09/25/2012   *RADIOLOGY REPORT*  Clinical Data: Status post dialysis catheter placement  PORTABLE CHEST - 1 VIEW  Comparison: 04/24/2008  Findings: There is a right chest wall dialysis catheter with tips in the distal SVC and cavoatrial junction. No pneumothorax identified.  Normal heart size.  No pleural effusion or edema.  No airspace consolidation identified.  IMPRESSION:  1.  No complications status post dialysis catheter placement.   Original Report Authenticated By: Kerby Moors, M.D.   Dg Fluoro Guide Cv Line-no Report  09/25/2012   CLINICAL DATA: placement dialysis catheter   FLOURO GUIDE CV LINE  Fluoroscopy was utilized by the requesting physician.  No radiographic  interpretation.     Scheduled Meds: . amLODipine  10 mg Oral Daily  . insulin aspart  0-5 Units Subcutaneous QHS  . insulin aspart  0-9 Units Subcutaneous TID WC  . levothyroxine  125 mcg Oral QAC breakfast  . LORazepam  4 mg Oral Once  . multivitamin  1 tablet Oral Daily  . pantoprazole  40 mg Oral Daily  . sevelamer carbonate  4.8 g Oral TID WC   . sodium chloride  3 mL Intravenous Q12H  . tamsulosin  0.4 mg Oral Daily  . zolpidem  5 mg Oral QHS   Continuous Infusions:   Active Problems:   HYPERTENSION, BENIGN   Breast cancer   Anemia, unspecified   DM2 (diabetes mellitus, type 2)   AKI (acute kidney injury)    Time spent: 40 minutes  [facility]  [facility] Pager [phone]. If 8PM-8AM, please contact night-coverage at [email or web address], password [facility] 10/03/2012, 7:39 AM  LOS: 9 days

## 2012-10-03 NOTE — Progress Notes (Signed)
Subjective: Slept better last night. Has an episode of SOB but it is better this AM.  Objective: Vital signs in last 24 hours: Temp:  [98.4 F (36.9 C)-99.2 F (37.3 C)] 98.4 F (36.9 C) (07/10 0453) Pulse Rate:  [65-71] 70 (07/10 0453) Resp:  [17-18] 18 (07/10 0453) BP: (135-179)/(55-65) 139/55 mmHg (07/10 0453) SpO2:  [95 %-98 %] 98 % (07/10 0453) Weight:  [133 lb 13.1 oz (60.7 kg)] 133 lb 13.1 oz (60.7 kg) (07/09 2021) Weight change: 0.3 oz (0.009 kg)  Intake/Output from previous day: 07/09 0701 - 07/10 0700 In: 5096 [P.O.:600] Out: 9430 [Urine:9430] Intake/Output this shift:   General appearance: alert, cooperative and pale. Having breakfast in bed Resp: clear to auscultation bilaterally. No increased work of breathing. Abdomen: Normal fullness, normal BS, non-tender, no rebound of guarding tenderness. Left bx site no bleeding. Clear urine. On continuous bladder irrigation Cardio: S1, S2 normal and systolic murmur: Extremities: extremities normal, 2+ pulses in BU&LE. No edema  Lab Results:  Recent Labs  10/01/12 1502 10/02/12 0645 10/02/12 1235 10/02/12 2310  WBC 6.3 6.1  --   --   HGB 7.3* 8.4* 9.3* 8.7*  HCT 20.6* 24.0* 26.8* 24.9*  PLT 105* 98*  --   --    BMET:   Recent Labs  10/01/12 1502 10/02/12 0645  NA 130* 130*  K 3.9 3.8  CL 93* 95*  CO2 25 23  GLUCOSE 159* 120*  BUN 62* 63*  CREATININE 5.98* 6.06*  CALCIUM 8.1* 8.4    Studies/Results: Ct Abdomen Pelvis Wo Contrast  10/01/2012   *RADIOLOGY REPORT*  Clinical Data: Hematuria status post biopsy.  CT ABDOMEN AND PELVIS WITHOUT CONTRAST  Technique:  Multidetector CT imaging of the abdomen and pelvis was performed following the standard protocol without intravenous contrast.  Comparison: Ultrasound guidance images performed today.  CT 09/26/2012.  Findings: There is a small amount of perinephric stranding around the mid and lower pole of the left kidney, likely small hematoma associated with today's  renal biopsy.  No significant subcapsular or perinephric hematoma.  Again noted is the 11 mm renal pelvic stone and other nonobstructing stones in the kidneys bilaterally. No hydronephrosis.  Ureters are decompressed.  Foley catheter is in place.  Small amount of air in the urinary bladder.  There are small bilateral pleural effusions and bibasilar atelectasis, new since prior study.  Liver, spleen, adrenals and stomach have an unremarkable unenhanced appearance.  Gallbladder is contracted.  Extensive calcifications throughout the atrophic pancreas compatible with chronic pancreatitis.  Extensive descending colonic and sigmoid diverticulosis.  No active diverticulitis.  Small bowel is decompressed.  No free fluid, free air or adenopathy.  Changes of prior right hip replacement on.  No acute bony abnormality.  IMPRESSION: Slight stranding along the mid and lower poles of the left kidney compatible with slight hematoma from today's renal biopsy.  No significant subcapsular or perinephric hematoma.  Bilateral nephrolithiasis.  11 mm left renal pelvic stone.  No evidence of obstruction.  New small bilateral pleural effusions and bibasilar atelectasis.   Original Report Authenticated By: Rolm Baptise, M.D.   Nm Pulmonary Perfusion  10/03/2012   *RADIOLOGY REPORT*  Clinical Data: Shortness of breath.  NM PULMONARY PERFUSION PARTICULATE  Radiopharmaceutical: 6MILLI CURIE MAA TECHNETIUM TO 2M ALBUMIN AGGREGATED .  The patient refused ventilation portion of the study.  Comparison: Chest x-ray earlier today.  Findings: There are no segmental or subsegmental perfusion defects in the lungs.  Patchy nonsegmental perfusion defects, likely related  to obstructive airways disease.  IMPRESSION: Low probability study for pulmonary embolus.   Original Report Authenticated By: Rolm Baptise, M.D.   Dg Chest Port 1 View  10/02/2012   *RADIOLOGY REPORT*  Clinical Data: Shortness of breath, chest pain.  PORTABLE CHEST - 1 VIEW   Comparison: 09/29/2012  Findings: Increasing density in the lung bases bilaterally, right greater than left.  This could represent asymmetric edema or infection.  Mild interstitial prominence and peribronchial thickening.  Heart is normal size.  Right dialysis catheters unchanged.  No visible effusions.  IMPRESSION: Increasing bilateral lower lung airspace opacities, right slightly greater than left as well as interstitial prominence.  Findings could represent edema or infection.   Original Report Authenticated By: Rolm Baptise, M.D.    I have reviewed the patient's current medications.  Assessment/Plan:  # AKI/CKD: Had HD on7/2 and 7/6 2/2 increasing Cr. 4.4 >>5.55. ?Infiltrative with elevated free light chains.  Renal biopsy 7/7- results pending. Elevated light chains on SPEP. Today's labs pending. Plan  - No acute need for HD today.  # SOB: Recurrent during hospitalization. Etiology unclear. V/Q scan low prob. Will continue to monitor.  # Hematuria: Improving. S/p left renal biopsy. CT abdominal 7/9 - no significant hematoma formation. On cont bladder irrigation with clearing.  # Anemia. Stable. Related to acute blood loss from renal bx in setting of chronic anemia related to CKD. Has required 2 units for PRBC. Hgb 7.3 >8.4. FOBT negative Plan  - CBC q12hrs  - Hgb goal >7 - Monitor hematuria   #Thrombocytopenia, stable as of 7/5. Abnormal platelet function. Desmopressin pre-biopsy for prevention of bleeding. #HTN: BP stable today. Continue with Amlodipine 10 mg   LOS: 9 days   Signed:  Jessee Avers, MD PGY-5 Internal Medicine Teaching Service Pager: [phone] 10/03/2012, 8:40 AM

## 2012-10-03 NOTE — Progress Notes (Signed)
I have personally seen and examined this patient and agree with the assessment/plan as outlined above by Alice Rieger MD (PGY2). Reducing hematuria s/p renal biopsy. Will await labs from today to decide on any HD needs- none seen clinically. Biopsy results pending. Auriella Wieand K.,MD 10/03/2012 10:01 AM

## 2012-10-04 LAB — BASIC METABOLIC PANEL
CO2: 24 mEq/L (ref 19–32)
Glucose, Bld: 100 mg/dL — ABNORMAL HIGH (ref 70–99)
Potassium: 3 mEq/L — ABNORMAL LOW (ref 3.5–5.1)
Sodium: 131 mEq/L — ABNORMAL LOW (ref 135–145)

## 2012-10-04 LAB — CBC
HCT: 24.1 % — ABNORMAL LOW (ref 36.0–46.0)
Hemoglobin: 8.6 g/dL — ABNORMAL LOW (ref 12.0–15.0)
Hemoglobin: 9.3 g/dL — ABNORMAL LOW (ref 12.0–15.0)
MCHC: 35.7 g/dL (ref 30.0–36.0)
MCV: 88 fL (ref 78.0–100.0)
RBC: 3 MIL/uL — ABNORMAL LOW (ref 3.87–5.11)
WBC: 4.8 10*3/uL (ref 4.0–10.5)
WBC: 5.9 10*3/uL (ref 4.0–10.5)

## 2012-10-04 LAB — GLUCOSE, CAPILLARY
Glucose-Capillary: 103 mg/dL — ABNORMAL HIGH (ref 70–99)
Glucose-Capillary: 141 mg/dL — ABNORMAL HIGH (ref 70–99)

## 2012-10-04 MED ORDER — POTASSIUM CHLORIDE CRYS ER 20 MEQ PO TBCR
40.0000 meq | EXTENDED_RELEASE_TABLET | Freq: Once | ORAL | Status: AC
  Administered 2012-10-04: 40 meq via ORAL
  Filled 2012-10-04: qty 2

## 2012-10-04 NOTE — Progress Notes (Signed)
[facility] PROGRESS NOTE  Anita Pollard [id number] DOB: 1940-04-09 DOA: 09/24/2012 PCP: Lawerance Cruel, MD  Assessment/Plan: Active Problems:   HYPERTENSION, BENIGN   Breast cancer   Anemia, unspecified   DM2 (diabetes mellitus, type 2)   AKI (acute kidney injury)    Interval summary  72 year old female with a hx of HTN, Ca breast cancer,DM diet controlled, hypothyroidism presented to the ED after receiving a call from her nephrologist that her creatinine has significantly worsened since May 2014. In ER, creatinine was 12.20. The patient saw her PCP Dr. Lawerance Cruel, and creatinine was 2.34 on 08/12/2012 as part of routine blood work. Labs redrawn on 08/22/2012 and her serum creatinine was noted to be 2.7.  The patient was referred to nephrology and saw Dr. Shirl Harris on 08/29/12. UA was bland but Cr was 4.2 on 08/29/2012. On 06/12, the patient had a serum creatinine of 6.41. Labs on 09/23/2012 showed a serum creatinine of 11.27 which prompted a call to have the patient go to the ED. A repeat renal ultrasound on 09/24/12 was negative for hydronephrosis. The patient denied any NSAID use however she takes a number of OTC supplements.  Nephrology was consulted and started the renal workup. ASO, hepatitis panel are negative, C3 slightly low. Patient has elevated urine free light chains which favour infiltrative renal disease, hence the renal biopsy is planned on Monday.  Vascular surgery was also consulted and patient underwent hemodialysis catheter placement on 7/2. Patient underwent first hemodialysis on 09/25/12, then 7/5, still unclear if she is going to need permanent HD.  Consults:  Nephrology: Dr. Jimmy Footman  Vascular surgery: Dr Kellie Simmering  Assessment/Plan:  Hematuria status post renal biopsy  Started on continuous bladder irrigation  Discussed with Dr. Posey Pronto nephrology  Patient received desmopressin IV pre-and post renal biopsy  Hyponatremia and hypertension as side  effects of desmopressin and the patient is clearly demonstrating that  Has received 2 units of packed red blood cells  No evidence of perinephric hematoma  IV fluids discontinued because of increasing shortness of breath  Received one dose of Lasix last night because of shortness of breath  Discharge plans pending resolution of hematuria  Next step is to remove Foley catheter and to see the patient is voiding on her own   Acute kidney injury - unclear etiology, with uremia, metabolic acidosis, hypokalemia.  - renal consulted, workup in progress, dialysis catheter placed, started on HD 09/25/12 x 1, second HD on 7/5.  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  Renal biopsy done  PFA not done as sample hemo lysed prior to renal biopsy  Developed hematuria yesterday after renal biopsy, has a Foley in place because of retention    Shortness of breath: Likely increasing pulmonary edema from IV fluids and blood, on 2 L of oxygen, VQ scan negative  Echo shows EF of 65-70%  PA pressure of 47 mm   Thrombocytopenia platelet count has been stable    Hypertension  - Currently stable  Hypothyroidism  -TSH 0.163, reduced home Synthroid dose to 154mcg  Diabetes mellitus2 : Diet controlled at home  -hemoglobin A1c 6.8 in 09/25/12,  - Placed  on NovoLog sliding-scale qac and hs  Metabolic acidosis - improved, bicarbonate drip dc'd  history of Right breast cancer  -Dr. Jana Hakim is oncologist, in remission   Normocytic anemia now with gross hematuria status post renal biopsy -Suspect due to renal disease, repeat  anemia panel also shows iron level of 188, ferritin 196, TIBC 214  - Hemoglobin 6.0 upon admission, FOBT negative,  History of hemorrhoids: placed on hydrocortisone supp per request from patient    Disposition  Dc in am   HPI/Subjective: Complains of mild shortness of breath  Objective: Filed Vitals:   10/03/12 1300 10/03/12 1602 10/03/12 2208 10/04/12 0525  BP: 128/58 153/59 139/52 138/52  Pulse: 73 67 66 64  Temp: 97.8 F (36.6 C)  98.8 F (37.1 C) 98.8 F (37.1 C)  TempSrc: Oral  Oral Oral  Resp: 18 19 18 18   Height:   5\' 1"  (1.549 m)   Weight:   59.6 kg (131 lb 6.3 oz)   SpO2: 99% 96% 96% 98%    Intake/Output Summary (Last 24 hours) at 10/04/12 0748 Last data filed at 10/04/12 0524  Gross per 24 hour  Intake   2780 ml  Output   6000 ml  Net  -3220 ml    Exam: General appearance: alert, cooperative and pale. Having breakfast in bed  Resp: clear to auscultation bilaterally. No increased work of breathing.  Abdomen: Normal fullness, normal BS, non-tender, no rebound of guarding tenderness. Left bx site no bleeding. Clear urine. On continuous bladder irrigation  Cardio: S1, S2 normal and systolic murmur:  Extremities: extremities normal, 2+ pulses in BU&LE. No edema       Data Reviewed: Basic Metabolic Panel:  Recent Labs Lab 09/28/12 0350 09/29/12 0515 09/30/12 0500 10/01/12 1502 10/02/12 0645 10/03/12 1418  NA 137 137 134* 130* 130* 130*  K 3.3* 4.0 4.0 3.9 3.8 3.6  CL 98 103 98 93* 95* 91*  CO2 27 28 25 25 23 25   GLUCOSE 107* 102* 100* 159* 120* 160*  BUN 65* 33* 50* 62* 63* 63*  CREATININE 7.21* 4.40* 5.55* 5.98* 6.06* 6.31*  CALCIUM 7.7* 7.8* 7.9* 8.1* 8.4 8.8  PHOS 3.7  --   --   --   --   --     Liver Function Tests:  Recent Labs Lab 09/28/12 0350  AST 13  ALT <5  ALKPHOS 40  BILITOT 0.3  PROT 5.5*  ALBUMIN 3.0*   No results found for this basename: LIPASE, AMYLASE,  in the last 168 hours No results found for this basename:  AMMONIA,  in the last 168 hours  CBC:  Recent Labs Lab 09/30/12 2200 10/01/12 0818 10/01/12 1502 10/02/12 0645 10/02/12 1235 10/02/12 2310 10/04/12 0700  WBC 7.6 6.8 6.3 6.1  --   --  4.8  HGB 6.0* 8.0* 7.3* 8.4* 9.3* 8.7* 8.6*  HCT 17.3* 22.1* 20.6* 24.0* 26.8* 24.9* 24.1*  MCV 89.6 88.0 88.4 87.3  --   --  88.0  PLT 105* 105* 105* 98*  --   --  108*    Cardiac Enzymes:  Recent Labs Lab 10/02/12 2310  TROPONINI <0.30   BNP (last 3 results) No results found for this basename: PROBNP,  in the last 8760 hours   CBG:  Recent Labs Lab 10/02/12 2034 10/03/12 0757 10/03/12 1317 10/03/12 1711 10/03/12 2158  GLUCAP 140* 106* 141* 144* 128*    No results found for this or any previous visit (from the past 240 hour(s)).  Studies: Ct Abdomen Pelvis Wo Contrast  10/01/2012   *RADIOLOGY REPORT*  Clinical Data: Hematuria status post biopsy.  CT ABDOMEN AND PELVIS WITHOUT CONTRAST  Technique:  Multidetector CT imaging of the abdomen and pelvis was performed following the standard protocol without intravenous contrast.  Comparison: Ultrasound guidance images performed today.  CT 09/26/2012.  Findings: There is a small amount of perinephric stranding around the mid and lower pole of the left kidney, likely small hematoma associated with today's renal biopsy.  No significant subcapsular or perinephric hematoma.  Again noted is the 11 mm renal pelvic stone and other nonobstructing stones in the kidneys bilaterally. No hydronephrosis.  Ureters are decompressed.  Foley catheter is in place.  Small amount of air in the urinary bladder.  There are small bilateral pleural effusions and bibasilar atelectasis, new since prior study.  Liver, spleen, adrenals and stomach have an unremarkable unenhanced appearance.  Gallbladder is contracted.  Extensive calcifications throughout the atrophic pancreas compatible with chronic pancreatitis.  Extensive descending colonic and sigmoid diverticulosis.  No  active diverticulitis.  Small bowel is decompressed.  No free fluid, free air or adenopathy.  Changes of prior right hip replacement on.  No acute bony abnormality.  IMPRESSION: Slight stranding along the mid and lower poles of the left kidney compatible with slight hematoma from today's renal biopsy.  No significant subcapsular or perinephric hematoma.  Bilateral nephrolithiasis.  11 mm left renal pelvic stone.  No evidence of obstruction.  New small bilateral pleural effusions and bibasilar atelectasis.   Original Report Authenticated By: Rolm Baptise, M.D.   Ct Abdomen Pelvis Wo Contrast  09/26/2012   *RADIOLOGY REPORT*  Clinical Data: Acute renal insufficiency.  Question retroperitoneal obstruction.  CT ABDOMEN AND PELVIS WITHOUT CONTRAST  Technique:  Multidetector CT imaging of the abdomen and pelvis was performed following the standard protocol without intravenous contrast.  Comparison: 12/02/2010.  Findings: Lung bases show no acute findings.  Heart size normal. No pericardial or pleural effusion.  Liver, gallbladder, and right adrenal gland are unremarkable.  Left adrenal gland appears minimally prominent, as before, stable. Stones are seen in the kidneys bilaterally, measuring up to 11 mm on the left.  Ureters are decompressed.  Spleen is unremarkable. Calcifications are seen in an atrophic pancreas.  No ductal dilatation.  Stomach and bowel are unremarkable.  Foley catheter is seen in a decompressed bladder.  Hysterectomy. Ovaries are visualized.  No free fluid.  Atherosclerotic calcification of the arterial vasculature without abdominal aortic aneurysm.  No pathologically enlarged lymph nodes. No worrisome lytic or sclerotic lesions.  Right hip arthroplasty.  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  IMPRESSION:  1.  Bilateral renal stones without obstruction. 2.  Mild prominence of the left adrenal gland, stable. 3.  Chronic calcific pancreatitis. 4.  Mild changes of avascular  necrosis in the left femoral head.   Original Report Authenticated By: Lorin Picket, M.D.   Dg Chest 2 View  09/29/2012   *RADIOLOGY REPORT*  Clinical Data: Dyspnea and cough  CHEST - 2 VIEW  Comparison: 10/23/2012  Findings: There is a right sided IJ catheter with tip in the distal SVC.  Heart size is normal.  No pleural effusion or edema.  No airspace consolidation identified.  IMPRESSION:  1.  No acute cardiopulmonary abnormalities.   Original Report Authenticated By: Kerby Moors, M.D.   Nm Pulmonary Perfusion  10/03/2012   *RADIOLOGY REPORT*  Clinical Data: Shortness of breath.  NM PULMONARY PERFUSION PARTICULATE  Radiopharmaceutical: 6MILLI CURIE MAA TECHNETIUM TO 37M ALBUMIN AGGREGATED .  The patient refused ventilation portion of the study.  Comparison: Chest x-ray earlier today.  Findings: There are no segmental or subsegmental perfusion defects in the lungs.  Patchy nonsegmental perfusion defects, likely related to obstructive airways disease.  IMPRESSION: Low probability study for pulmonary embolus.   Original Report Authenticated By: Rolm Baptise, M.D.   US Renal  09/24/2012   *RADIOLOGY REPORT*  Clinical Data: Elevated creatinine  RENAL/URINARY TRACT ULTRASOUND COMPLETE  Comparison:  09/02/2012  Findings:  Right Kidney:  10.5 cm in length.  The kidney is extremely echogenic consistent with medical renal disease and stable from prior exam.  Left Kidney:  10.8 cm in length.  Increased echogenicity is noted similar to that seen on prior study.  A tiny 6 mm cyst is noted. Echogenic focus in the lower pole of the left kidney consistent with a nonobstructing stone.  This is stable from prior exam.  Bladder:  Partially decompressed  IMPRESSION: No mass lesion or hydronephrosis is noted.  Increased echogenicity is identified consistent with medical renal disease.  Stable nonobstructing left renal stone   Original Report Authenticated By: Inez Catalina, M.D.   US Biopsy-no Radiologist  09/30/2012    CLINICAL DATA: deterding   Ultrasound was utilized for biopsy by the requesting physician.    Dg Chest Port 1 View  10/02/2012   *RADIOLOGY REPORT*  Clinical Data: Shortness of breath, chest pain.  PORTABLE CHEST - 1 VIEW  Comparison: 09/29/2012  Findings: Increasing density in the lung bases bilaterally, right greater than left.  This could represent asymmetric edema or infection.  Mild interstitial prominence and peribronchial thickening.  Heart is normal size.  Right dialysis catheters unchanged.  No visible effusions.  IMPRESSION: Increasing bilateral lower lung airspace opacities, right slightly greater than left as well as interstitial prominence.  Findings could represent edema or infection.   Original Report Authenticated By: Rolm Baptise, M.D.   Dg Chest Port 1 View  09/25/2012   *RADIOLOGY REPORT*  Clinical Data: Status post dialysis catheter placement  PORTABLE CHEST - 1 VIEW  Comparison: 04/24/2008  Findings: There is a right chest wall dialysis catheter with tips in the distal SVC and cavoatrial junction. No pneumothorax identified.  Normal heart size.  No pleural effusion or edema.  No airspace consolidation identified.  IMPRESSION:  1.  No complications status post dialysis catheter placement.   Original Report Authenticated By: Kerby Moors, M.D.   Dg Fluoro Guide Cv Line-no Report  09/25/2012   CLINICAL DATA: placement dialysis catheter   FLOURO GUIDE CV LINE  Fluoroscopy was utilized by the requesting physician.  No radiographic  interpretation.     Scheduled Meds: . amLODipine  10 mg Oral Daily  . insulin aspart  0-5 Units Subcutaneous QHS  . insulin aspart  0-9 Units Subcutaneous TID WC  . levothyroxine  125 mcg Oral QAC breakfast  . LORazepam  4 mg Oral Once  . multivitamin  1 tablet Oral Daily  . pantoprazole  40 mg Oral Daily  . sevelamer carbonate  4.8 g Oral TID WC  . sodium chloride  3 mL Intravenous Q12H  . tamsulosin  0.4 mg Oral Daily  . zolpidem  5 mg Oral QHS    Continuous Infusions:   Active Problems:   HYPERTENSION, BENIGN   Breast cancer   Anemia, unspecified   DM2 (diabetes mellitus, type 2)   AKI (acute kidney injury)    Time spent: 7  minutes   [facility] Pager [phone]. If 8PM-8AM, please contact night-coverage at [email or web address], password [facility] 10/04/2012, 7:48 AM  LOS: 10 days

## 2012-10-04 NOTE — Progress Notes (Addendum)
I have personally seen and examined this patient and agree with the assessment/plan as outlined above by [facility] MD. 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 The former indicates some chance at renal recovery but the latter suggests she will likely have a new lower GFR after ATN recovery. No HD needs today and hematuria nearly resolved.  Brevyn Ring K.,MD 10/04/2012 10:53 AM

## 2012-10-04 NOTE — Progress Notes (Signed)
Subjective: Slept better last night. Had a dose of Lasix last afternoon.   Objective: Vital signs in last 24 hours: Temp:  [97.8 F (36.6 C)-98.8 F (37.1 C)] 98.8 F (37.1 C) (07/11 0525) Pulse Rate:  [64-73] 64 (07/11 0525) Resp:  [18-19] 18 (07/11 0525) BP: (128-153)/(52-59) 138/52 mmHg (07/11 0525) SpO2:  [96 %-99 %] 98 % (07/11 0525) Weight:  [131 lb 6.3 oz (59.6 kg)] 131 lb 6.3 oz (59.6 kg) (07/10 2208) Weight change: -2 lb 6.8 oz (-1.1 kg)  Intake/Output from previous day: 07/10 0701 - 07/11 0700 In: 2780 [P.O.:540] Out: 6000 [Urine:6000] Intake/Output this shift: Total I/O In: 200 [Other:200] Out: 1000 [Urine:1000] General appearance: alert, cooperative and pale. Up in bed Resp: clear to auscultation bilaterally. No increased work of breathing. Abdomen: Normal fullness, normal BS, non-tender, no rebound of guarding tenderness. Left bx site no bleeding. Clear urine. Still on continuous bladder irrigation Cardio: S1, S2 normal and systolic murmur: Extremities: extremities normal, 2+ pulses in BU&LE. No edema  Lab Results:  Recent Labs  10/02/12 0645  10/02/12 2310 10/04/12 0700  WBC 6.1  --   --  4.8  HGB 8.4*  < > 8.7* 8.6*  HCT 24.0*  < > 24.9* 24.1*  PLT 98*  --   --  108*  < > = values in this interval not displayed. BMET:   Recent Labs  10/03/12 1418 10/04/12 0700  NA 130* 131*  K 3.6 3.0*  CL 91* 92*  CO2 25 24  GLUCOSE 160* 100*  BUN 63* 64*  CREATININE 6.31* 6.31*  CALCIUM 8.8 8.7    Studies/Results: Nm Pulmonary Perfusion  10/03/2012   *RADIOLOGY REPORT*  Clinical Data: Shortness of breath.  NM PULMONARY PERFUSION PARTICULATE  Radiopharmaceutical: 6MILLI CURIE MAA TECHNETIUM TO 1M ALBUMIN AGGREGATED .  The patient refused ventilation portion of the study.  Comparison: Chest x-ray earlier today.  Findings: There are no segmental or subsegmental perfusion defects in the lungs.  Patchy nonsegmental perfusion defects, likely related to  obstructive airways disease.  IMPRESSION: Low probability study for pulmonary embolus.   Original Report Authenticated By: Rolm Baptise, M.D.   Dg Chest Port 1 View  10/02/2012   *RADIOLOGY REPORT*  Clinical Data: Shortness of breath, chest pain.  PORTABLE CHEST - 1 VIEW  Comparison: 09/29/2012  Findings: Increasing density in the lung bases bilaterally, right greater than left.  This could represent asymmetric edema or infection.  Mild interstitial prominence and peribronchial thickening.  Heart is normal size.  Right dialysis catheters unchanged.  No visible effusions.  IMPRESSION: Increasing bilateral lower lung airspace opacities, right slightly greater than left as well as interstitial prominence.  Findings could represent edema or infection.   Original Report Authenticated By: Rolm Baptise, M.D.    I have reviewed the patient's current medications.  Assessment/Plan:  # AKI/CKD: Stable renal function. Had HD on7/2 and 7/6 2/2 . Cr 6.31 > 6.31. ?Infiltrative with elevated free light chains. Renal biopsy 7/7- results pending. Elevated light chains on SPEP. Plan  - No acute need for HD - likely will require dialysis as a long term plan.  # Hypokalemia: K -3.0. Likely due to low volume. Will replace orally with KCl.   # SOB: Received on IV Lasix 80 mg once with good improvement. No clinical evidence of fluid overload.  Etiology unclear. V/Q scan low prob. Will continue to monitor.  # Hematuria: Improving. S/p left renal biopsy. CT abdominal 7/9 - no significant hematoma formation. On cont  bladder irrigation with clearing of urine.  # Anemia. Stable. Related to acute blood loss from renal bx in setting of chronic anemia related to CKD. Has required 2 units for PRBC. Hgb 7.3 >8.4. FOBT negative Plan  - CBC q12hrs  - Hgb goal >7 - Monitor hematuria   #Thrombocytopenia, stable as of 7/5. Abnormal platelet function. Desmopressin pre-biopsy for prevention of bleeding.  #HTN: BP stable today.  Continue with Amlodipine 10 mg   LOS: 10 days   Signed:  Jessee Avers, MD PGY-5 Internal Medicine Teaching Service Pager: [phone] 10/04/2012, 9:20 AM

## 2012-10-05 LAB — HEPATIC FUNCTION PANEL
ALT: 16 U/L (ref 0–35)
AST: 22 U/L (ref 0–37)
Albumin: 3.2 g/dL — ABNORMAL LOW (ref 3.5–5.2)
Alkaline Phosphatase: 48 U/L (ref 39–117)
Bilirubin, Direct: <0.1 mg/dL (ref 0.0–0.3)
Total Bilirubin: 0.4 mg/dL (ref 0.3–1.2)

## 2012-10-05 LAB — BASIC METABOLIC PANEL
BUN: 64 mg/dL — ABNORMAL HIGH (ref 6–23)
CO2: 26 mEq/L (ref 19–32)
Chloride: 90 mEq/L — ABNORMAL LOW (ref 96–112)
Creatinine, Ser: 5.9 mg/dL — ABNORMAL HIGH (ref 0.50–1.10)
Glucose, Bld: 94 mg/dL (ref 70–99)
Potassium: 3.6 mEq/L (ref 3.5–5.1)

## 2012-10-05 LAB — GLUCOSE, CAPILLARY
Glucose-Capillary: 121 mg/dL — ABNORMAL HIGH (ref 70–99)
Glucose-Capillary: 120 mg/dL — ABNORMAL HIGH (ref 70–99)

## 2012-10-05 LAB — CBC
HCT: 23.6 % — ABNORMAL LOW (ref 36.0–46.0)
Hemoglobin: 8.5 g/dL — ABNORMAL LOW (ref 12.0–15.0)
MCH: 31.1 pg (ref 26.0–34.0)
MCHC: 36 g/dL (ref 30.0–36.0)
MCV: 86.4 fL (ref 78.0–100.0)
RDW: 12 % (ref 11.5–15.5)

## 2012-10-05 LAB — SODIUM, URINE, RANDOM: Sodium, Ur: 71 mEq/L

## 2012-10-05 MED ORDER — ALUM & MAG HYDROXIDE-SIMETH 200-200-20 MG/5ML PO SUSP
15.0000 mL | Freq: Four times a day (QID) | ORAL | Status: DC | PRN
  Administered 2012-10-05: 15 mL via ORAL
  Filled 2012-10-05 (×3): qty 30

## 2012-10-05 NOTE — Progress Notes (Signed)
Subjective: She feels much better today. Hematuria has resolved.  No overnight events. Objective: Vital signs in last 24 hours: Temp:  [97.9 F (36.6 C)-98.8 F (37.1 C)] 97.9 F (36.6 C) (07/12 0930) Pulse Rate:  [61-88] 61 (07/12 0930) Resp:  [18] 18 (07/12 0930) BP: (134-145)/(50-73) 145/73 mmHg (07/12 0930) SpO2:  [92 %-99 %] 92 % (07/12 0930) Weight:  [131 lb 6.3 oz (59.6 kg)] 131 lb 6.3 oz (59.6 kg) (07/11 2034) Weight change: -0 oz (-0 kg)  Intake/Output from previous day: 07/11 0701 - 07/12 0700 In: 440 [P.O.:240] Out: 2675 [Urine:2675] Intake/Output this shift: Total I/O In: 0  Out: 300 [Urine:300] General appearance: alert, cooperative and pale. Up in bed watching TV Resp: clear to auscultation bilaterally. Abdomen: Normal fullness, normal BS, non-tender, no rebound of guarding tenderness.  Cardio: S1, S2 normal and systolic murmur: Extremities: extremities normal, 2+ pulses in BU&LE. No edema  Lab Results:  Recent Labs  10/04/12 1610 10/05/12 0625  WBC 5.9 4.7  HGB 9.3* 8.5*  HCT 26.1* 23.6*  PLT 127* 121*   BMET:   Recent Labs  10/04/12 0700 10/05/12 0625  NA 131* 127*  K 3.0* 3.6  CL 92* 90*  CO2 24 26  GLUCOSE 100* 94  BUN 64* 64*  CREATININE 6.31* 5.90*  CALCIUM 8.7 8.3*    Studies/Results: No results found.   Scheduled Meds: . amLODipine  10 mg Oral Daily  . insulin aspart  0-5 Units Subcutaneous QHS  . insulin aspart  0-9 Units Subcutaneous TID WC  . levothyroxine  125 mcg Oral QAC breakfast  . LORazepam  4 mg Oral Once  . multivitamin  1 tablet Oral Daily  . pantoprazole  40 mg Oral Daily  . sevelamer carbonate  4.8 g Oral TID WC  . sodium chloride  3 mL Intravenous Q12H  . tamsulosin  0.4 mg Oral Daily  . zolpidem  5 mg Oral QHS   PRN Meds:.acetaminophen, acetaminophen, benzonatate, camphor-menthol, feeding supplement (NEPRO CARB STEADY), heparin, HYDROmorphone (DILAUDID) injection, lidocaine (PF), lidocaine-prilocaine,  LORazepam, ondansetron (ZOFRAN) IV, ondansetron, oxyCODONE-acetaminophen, pentafluoroprop-tetrafluoroeth  I have reviewed the patient's current medications.  Assessment/Plan:  # AKI/CKD: Improving. Had HD on7/2 and 7/6 2/2 . Cr 6.31 >5.9. ?Infiltrative with elevated free light chains. 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 Elevated light chains on SPEP. Good UOP. Plan  - No acute need for HD - monitor  #Hyponatremia: Na 127 <<130. Clinically euvolemia / a little dry maybe. Not on diuretics.  Plan Serum and urine osmolarity Urine NA Fluid restriction to 1500 cc/day  # Hypokalemia: Improving. K -3.0 > 3.6. Monitor.   # SOB: Resolved. Received on IV Lasix 80 mg once on 7/10 with good improvement. No clinical evidence of fluid overload.  Etiology unclear. V/Q scan low prob. Will continue to monitor.  # Hematuria: Resolved. S/p left renal biopsy. CT abdominal 7/9 - no significant hematoma formation. Off continous bladder irrigation  # Anemia. Stable. Related to acute blood loss from renal bx in setting of chronic anemia related to CKD. Iron panel without evidence of iron deficiency. Has required 2 units for PRBC. Hgb 9.3 >8.5. FOBT negative Plan  - daily CBC - Hgb goal >7   #Thrombocytopenia, stable as of 7/5. Abnormal platelet function. Desmopressin pre-biopsy for prevention of bleeding.  #HTN: BP stable. Continue with Amlodipine 10 mg   LOS: 11 days   Signed:  Jessee Avers, MD PGY-5 Internal Medicine Teaching Service Pager:  [phone] 10/05/2012, 10:31 AM

## 2012-10-05 NOTE — Progress Notes (Signed)
I have personally seen and examined this patient and agree with the assessment/plan as outlined above by Alice Rieger MD (PGY2). Renal recovery appears underway with improving creatinine and UOP. Will work up hyponatremia. Zalmen Wrightsman K.,MD 10/05/2012 11:16 AM

## 2012-10-05 NOTE — Progress Notes (Signed)
[facility] PROGRESS NOTE  BROOKES STALOCH [id number] DOB: 12-12-1940 DOA: 09/24/2012 PCP: Lawerance Cruel, MD  Assessment/Plan: Active Problems:   HYPERTENSION, BENIGN   Breast cancer   Anemia, unspecified   DM2 (diabetes mellitus, type 2)   AKI (acute kidney injury)    Interval summary  72 year old female with a hx of HTN, Ca breast cancer,DM diet controlled, hypothyroidism presented to the ED after receiving a call from her nephrologist that her creatinine has significantly worsened since May 2014. In ER, creatinine was 12.20. The patient saw her PCP Dr. Lawerance Cruel, and creatinine was 2.34 on 08/12/2012 as part of routine blood work. Labs redrawn on 08/22/2012 and her serum creatinine was noted to be 2.7.  The patient was referred to nephrology and saw Dr. Shirl Harris on 08/29/12. UA was bland but Cr was 4.2 on 08/29/2012. On 06/12, the patient had a serum creatinine of 6.41. Labs on 09/23/2012 showed a serum creatinine of 11.27 which prompted a call to have the patient go to the ED. A repeat renal ultrasound on 09/24/12 was negative for hydronephrosis. The patient denied any NSAID use however she takes a number of OTC supplements.  Nephrology was consulted and started the renal workup. ASO, hepatitis panel are negative, C3 slightly low. Patient has elevated urine free light chains which favour infiltrative renal disease, hence the renal biopsy is planned on Monday.  Vascular surgery was also consulted and patient underwent hemodialysis catheter placement on 7/2. Patient underwent first hemodialysis on 09/25/12, then 7/5, still unclear if she is going to need permanent HD.   Consults:  Nephrology: Dr. Jimmy Footman  Vascular surgery: Dr Kellie Simmering   Assessment/Plan:  Hematuria status post renal biopsy  Started on continuous bladder irrigation  Discussed with Dr. Posey Pronto nephrology  Patient received desmopressin IV pre-and post renal biopsy  Hyponatremia and hypertension as  side effects of desmopressin and the patient is clearly demonstrating that  Has received 2 units of packed red blood cells  No evidence of perinephric hematoma  IV fluids discontinued because of increasing shortness of breath  Foley catheter discontinued yesterday, patient voiding     Acute kidney injury - biopsy showed diffuse severe ATN with tubular Ca-Oxalate deposition  , with uremia, metabolic acidosis, hypokalemia.  - renal consulted, workup in progress, dialysis catheter placed, started on HD 09/25/12 x 1, second HD on 7/5.  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  Renal biopsy done  PFA not done as sample hemo lysed prior to renal biopsy  Nephrology still needs to determine the patient's diuretic regimen and need for hemodialysis   Shortness of breath: Likely increasing pulmonary edema from IV fluids and blood, on 2 L of oxygen, VQ scan negative  Echo shows EF of 65-70%  PA pressure of 47 mm  Thrombocytopenia platelet count has been stable  Hypertension  - Currently stable  Hypothyroidism  -TSH 0.163, reduced home Synthroid dose to 135mcg  Diabetes mellitus2 : Diet controlled at home  -hemoglobin A1c 6.8 in 09/25/12,  - Placed on NovoLog sliding-scale qac and hs  Metabolic acidosis - improved, bicarbonate drip dc'd  history of Right breast cancer  -Dr. Jana Hakim  is oncologist, in remission  Normocytic anemia now with gross hematuria status post renal biopsy -Suspect due to renal disease, repeat anemia panel also shows iron level of 188, ferritin 196, TIBC 214  - Hemoglobin 6.0 upon  admission, FOBT negative,  History of hemorrhoids: placed on hydrocortisone supp per request from patient    Disposition  Assess hemodialysis needs and diuretic needs Possible discharge tomorrow     Objective: Filed Vitals:   10/04/12 1800 10/04/12 1921 10/04/12 2034 10/05/12 0420  BP: 140/56 144/59 143/61 137/56  Pulse: 63 70 66 69  Temp: 98.3 F (36.8 C) 98.4 F (36.9 C) 98.6 F (37 C) 98 F (36.7 C)  TempSrc: Oral Oral Oral Oral  Resp: 18 18 18 18   Height:   5\' 1"  (1.549 m)   Weight:   59.6 kg (131 lb 6.3 oz)   SpO2: 96% 98% 98% 99%    Intake/Output Summary (Last 24 hours) at 10/05/12 0737 Last data filed at 10/05/12 0421  Gross per 24 hour  Intake    440 ml  Output   2675 ml  Net  -2235 ml    Exam:  HENT:  Head: Atraumatic.  Nose: Nose normal.  Mouth/Throat: Oropharynx is clear and moist.  Eyes: Conjunctivae are normal. Pupils are equal, round, and reactive to light. No scleral icterus.  Neck: Neck supple. No tracheal deviation present.  Cardiovascular: Normal rate, regular rhythm, normal heart sounds and intact distal pulses.  Pulmonary/Chest: Effort normal and breath sounds normal. No respiratory distress.  Abdominal: Soft. Normal appearance and bowel sounds are normal. She exhibits no distension. There is no tenderness.  Musculoskeletal: She exhibits no edema and no tenderness.  Neurological: She is alert. No cranial nerve deficit.    Data Reviewed: Basic Metabolic Panel:  Recent Labs Lab 10/01/12 1502 10/02/12 0645 10/03/12 1418 10/04/12 0700 10/05/12 0625  NA 130* 130* 130* 131* 127*  K 3.9 3.8 3.6 3.0* 3.6  CL 93* 95* 91* 92* 90*  CO2 25 23 25 24 26   GLUCOSE 159* 120* 160* 100* 94  BUN 62* 63* 63* 64* 64*  CREATININE 5.98* 6.06* 6.31* 6.31* 5.90*  CALCIUM 8.1* 8.4 8.8 8.7 8.3*    Liver Function Tests: No results found for this basename: AST, ALT, ALKPHOS, BILITOT, PROT, ALBUMIN,  in the last 168 hours No results found for this  basename: LIPASE, AMYLASE,  in the last 168 hours No results found for this basename: AMMONIA,  in the last 168 hours  CBC:  Recent Labs Lab 10/01/12 1502 10/02/12 0645 10/02/12 1235 10/02/12 2310 10/04/12 0700 10/04/12 1610 10/05/12 0625  WBC 6.3 6.1  --   --  4.8 5.9 4.7  HGB 7.3* 8.4* 9.3* 8.7* 8.6* 9.3* 8.5*  HCT 20.6* 24.0* 26.8* 24.9* 24.1* 26.1* 23.6*  MCV 88.4 87.3  --   --  88.0 87.0 86.4  PLT 105* 98*  --   --  108* 127* 121*    Cardiac Enzymes:  Recent Labs Lab 10/02/12 2310  TROPONINI <0.30   BNP (last 3 results) No results found for this basename: PROBNP,  in the last 8760 hours   CBG:  Recent Labs Lab 10/04/12 0758 10/04/12 1156 10/04/12 1704 10/04/12 2033 10/05/12 0723  GLUCAP 103* 143* 114* 141* 93    No results found for this or any previous visit (from the past 240 hour(s)).   Studies: Ct Abdomen Pelvis Wo Contrast  10/01/2012   *RADIOLOGY REPORT*  Clinical Data: Hematuria status post biopsy.  CT ABDOMEN AND PELVIS WITHOUT CONTRAST  Technique:  Multidetector CT imaging of the abdomen and pelvis was performed following the standard protocol without intravenous contrast.  Comparison: Ultrasound guidance images performed today.  CT 09/26/2012.  Findings: There is a small amount of perinephric stranding around the mid and lower pole of the left kidney, likely small hematoma associated with today's renal biopsy.  No significant subcapsular or perinephric hematoma.  Again noted is the 11 mm renal pelvic stone and other nonobstructing stones in the kidneys bilaterally. No hydronephrosis.  Ureters are decompressed.  Foley catheter is in place.  Small amount of air in the urinary bladder.  There are small bilateral pleural effusions and bibasilar atelectasis, new since prior study.  Liver, spleen, adrenals and stomach have an unremarkable unenhanced appearance.  Gallbladder is contracted.  Extensive calcifications throughout the atrophic pancreas compatible  with chronic pancreatitis.  Extensive descending colonic and sigmoid diverticulosis.  No active diverticulitis.  Small bowel is decompressed.  No free fluid, free air or adenopathy.  Changes of prior right hip replacement on.  No acute bony abnormality.  IMPRESSION: Slight stranding along the mid and lower poles of the left kidney compatible with slight hematoma from today's renal biopsy.  No significant subcapsular or perinephric hematoma.  Bilateral nephrolithiasis.  11 mm left renal pelvic stone.  No evidence of obstruction.  New small bilateral pleural effusions and bibasilar atelectasis.   Original Report Authenticated By: Rolm Baptise, M.D.   Ct Abdomen Pelvis Wo Contrast  09/26/2012   *RADIOLOGY REPORT*  Clinical Data: Acute renal insufficiency.  Question retroperitoneal obstruction.  CT ABDOMEN AND PELVIS WITHOUT CONTRAST  Technique:  Multidetector CT imaging of the abdomen and pelvis was performed following the standard protocol without intravenous contrast.  Comparison: 12/02/2010.  Findings: Lung bases show no acute findings.  Heart size normal. No pericardial or pleural effusion.  Liver, gallbladder, and right adrenal gland are unremarkable.  Left adrenal gland appears minimally prominent, as before, stable. Stones are seen in the kidneys bilaterally, measuring up to 11 mm on the left.  Ureters are decompressed.  Spleen is unremarkable. Calcifications are seen in an atrophic pancreas.  No ductal dilatation.  Stomach and bowel are unremarkable.  Foley catheter is seen in a decompressed bladder.  Hysterectomy. Ovaries are visualized.  No free fluid.  Atherosclerotic calcification of the arterial vasculature without abdominal aortic aneurysm.  No pathologically enlarged lymph nodes. No worrisome lytic or sclerotic lesions.  Right hip arthroplasty.  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  IMPRESSION:  1.  Bilateral renal stones without obstruction. 2.  Mild prominence of the left  adrenal gland, stable. 3.  Chronic calcific pancreatitis. 4.  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   Original Report Authenticated By: Lorin Picket, M.D.   Dg Chest 2 View  09/29/2012   *RADIOLOGY REPORT*  Clinical Data: Dyspnea and cough  CHEST - 2 VIEW  Comparison: 10/23/2012  Findings: There is a right sided IJ catheter with tip in the distal SVC.  Heart size is normal.  No pleural effusion or edema.  No airspace consolidation identified.  IMPRESSION:  1.  No acute cardiopulmonary abnormalities.   Original Report Authenticated By: Kerby Moors, M.D.   Nm Pulmonary Perfusion  10/03/2012   *RADIOLOGY REPORT*  Clinical Data: Shortness of breath.  NM PULMONARY PERFUSION PARTICULATE  Radiopharmaceutical: 6MILLI CURIE MAA TECHNETIUM TO 12M ALBUMIN AGGREGATED .  The patient refused ventilation portion of the study.  Comparison: Chest x-ray earlier today.  Findings: There are no segmental or subsegmental perfusion defects in the lungs.  Patchy nonsegmental perfusion defects, likely related to obstructive airways disease.  IMPRESSION: Low probability study for pulmonary embolus.   Original Report Authenticated By: Rolm Baptise, M.D.   US Renal  09/24/2012   *RADIOLOGY REPORT*  Clinical Data: Elevated creatinine  RENAL/URINARY TRACT ULTRASOUND COMPLETE  Comparison:  09/02/2012  Findings:  Right Kidney:  10.5 cm in length.  The kidney is extremely echogenic consistent with medical renal disease and stable from prior exam.  Left Kidney:  10.8 cm in length.  Increased echogenicity is noted similar to that seen on prior study.  A tiny 6 mm cyst is noted. Echogenic focus in the lower pole of the left kidney consistent with a nonobstructing stone.  This is stable from prior exam.  Bladder:  Partially decompressed  IMPRESSION: No mass lesion or hydronephrosis is noted.  Increased echogenicity is identified consistent with medical renal disease.  Stable nonobstructing left renal stone   Original  Report Authenticated By: Inez Catalina, M.D.   US Biopsy-no Radiologist  09/30/2012   CLINICAL DATA: deterding   Ultrasound was utilized for biopsy by the requesting physician.    Dg Chest Port 1 View  10/02/2012   *RADIOLOGY REPORT*  Clinical Data: Shortness of breath, chest pain.  PORTABLE CHEST - 1 VIEW  Comparison: 09/29/2012  Findings: Increasing density in the lung bases bilaterally, right greater than left.  This could represent asymmetric edema or infection.  Mild interstitial prominence and peribronchial thickening.  Heart is normal size.  Right dialysis catheters unchanged.  No visible effusions.  IMPRESSION: Increasing bilateral lower lung airspace opacities, right slightly greater than left as well as interstitial prominence.  Findings could represent edema or infection.   Original Report Authenticated By: Rolm Baptise, M.D.   Dg Chest Port 1 View  09/25/2012   *RADIOLOGY REPORT*  Clinical Data: Status post dialysis catheter placement  PORTABLE CHEST - 1 VIEW  Comparison: 04/24/2008  Findings: There is a right chest wall dialysis catheter with tips in the distal SVC and cavoatrial junction. No pneumothorax identified.  Normal heart size.  No pleural effusion or edema.  No airspace consolidation identified.  IMPRESSION:  1.  No complications status post dialysis catheter placement.   Original Report Authenticated By: Kerby Moors, M.D.   Dg Fluoro Guide Cv Line-no Report  09/25/2012   CLINICAL DATA: placement dialysis catheter   FLOURO GUIDE CV LINE  Fluoroscopy was utilized by the requesting physician.  No radiographic  interpretation.     Scheduled Meds: . amLODipine  10 mg Oral Daily  . insulin aspart  0-5 Units Subcutaneous QHS  . insulin aspart  0-9 Units Subcutaneous TID WC  . levothyroxine  125 mcg Oral QAC breakfast  . LORazepam  4 mg Oral Once  . multivitamin  1 tablet Oral Daily  . pantoprazole  40 mg Oral Daily  . sevelamer carbonate  4.8 g Oral TID WC  . sodium chloride  3 mL  Intravenous Q12H  . tamsulosin  0.4 mg Oral Daily  . zolpidem  5 mg Oral QHS   Continuous Infusions:   Active Problems:   HYPERTENSION, BENIGN   Breast cancer   Anemia, unspecified   DM2 (diabetes mellitus, type 2)   AKI (acute kidney injury)    Time spent: 89 minutes   [facility] Pager [phone]. If 8PM-8AM, please contact night-coverage at [email or web address], password [facility] 10/05/2012, 7:37  AM  LOS: 11 days

## 2012-10-06 LAB — CBC
HCT: 25.9 % — ABNORMAL LOW (ref 36.0–46.0)
Hemoglobin: 9.5 g/dL — ABNORMAL LOW (ref 12.0–15.0)
MCH: 31.5 pg (ref 26.0–34.0)
RBC: 3.02 MIL/uL — ABNORMAL LOW (ref 3.87–5.11)

## 2012-10-06 LAB — BASIC METABOLIC PANEL
BUN: 66 mg/dL — ABNORMAL HIGH (ref 6–23)
Chloride: 88 mEq/L — ABNORMAL LOW (ref 96–112)
GFR calc non Af Amer: 6 mL/min — ABNORMAL LOW (ref >90)
Glucose, Bld: 91 mg/dL (ref 70–99)
Potassium: 3.5 mEq/L (ref 3.5–5.1)
Sodium: 126 mEq/L — ABNORMAL LOW (ref 135–145)

## 2012-10-06 LAB — GLUCOSE, CAPILLARY
Glucose-Capillary: 142 mg/dL — ABNORMAL HIGH (ref 70–99)
Glucose-Capillary: 162 mg/dL — ABNORMAL HIGH (ref 70–99)
Glucose-Capillary: 112 mg/dL — ABNORMAL HIGH (ref 70–99)

## 2012-10-06 MED ORDER — TAMSULOSIN HCL 0.4 MG PO CAPS: 0.4000 mg | ORAL_CAPSULE | Freq: Every day | ORAL | Status: DC

## 2012-10-06 MED ORDER — SEVELAMER CARBONATE 2.4 G PO PACK: 4.8000 g | PACK | Freq: Three times a day (TID) | ORAL | Status: DC

## 2012-10-06 NOTE — Progress Notes (Signed)
Physician Discharge Summary  Anita Pollard MRN: [id number] DOB/AGE: 03-Mar-1941 72 y.o.  PCP: Lawerance Cruel, MD   Admit date: 09/24/2012 Discharge date: 10/06/2012  Discharge Diagnoses:    Hematuria post renal biopsy Hyponatremia   HYPERTENSION, BENIGN   Breast cancer   Anemia, unspecified   DM2 (diabetes mellitus, type 2)   AKI (acute kidney injury)     Medication List    STOP taking these medications       aspirin 81 MG tablet     calcium carbonate 600 MG Tabs  Commonly known as:  OS-CAL     magnesium oxide 400 MG tablet  Commonly known as:  MAG-OX     Vitamin D-3 5000 UNITS Tabs      TAKE these medications       amLODipine 10 MG tablet  Commonly known as:  NORVASC  Take 5 mg by mouth daily.     b complex vitamins tablet  Take 1 tablet by mouth daily.     co-enzyme Q-10 30 MG capsule  Take 300 mg by mouth daily.     diphenhydrAMINE 25 MG tablet  Commonly known as:  BENADRYL  Take 25 mg by mouth at bedtime as needed.     GLUCOSAMINE 1500 COMPLEX Caps  Take by mouth 3 (three) times daily.     Krill Oil 1000 MG Caps  Take one tablet daily     levothyroxine 150 MCG tablet  Commonly known as:  SYNTHROID, LEVOTHROID  Take 150 mcg by mouth daily.     multivitamin tablet  Take 1 tablet by mouth daily.     omeprazole 20 MG capsule  Commonly known as:  PRILOSEC  Take 20 mg by mouth daily.     PROBIOTIC DAILY Caps  Take one capsule every other day     sevelamer carbonate 2.4 G Pack  Commonly known as:  RENVELA  Take 4.8 g by mouth 3 (three) times daily with meals.     tamsulosin 0.4 MG Caps  Commonly known as:  FLOMAX  Take 1 capsule (0.4 mg total) by mouth daily.        Discharge Condition: *Stable   Disposition:    Consults:    Significant Diagnostic Studies: Ct Abdomen Pelvis Wo Contrast  10/01/2012   *RADIOLOGY REPORT*  Clinical Data: Hematuria status post biopsy.  CT ABDOMEN AND PELVIS WITHOUT CONTRAST  Technique:   Multidetector CT imaging of the abdomen and pelvis was performed following the standard protocol without intravenous contrast.  Comparison: Ultrasound guidance images performed today.  CT 09/26/2012.  Findings: There is a small amount of perinephric stranding around the mid and lower pole of the left kidney, likely small hematoma associated with today's renal biopsy.  No significant subcapsular or perinephric hematoma.  Again noted is the 11 mm renal pelvic stone and other nonobstructing stones in the kidneys bilaterally. No hydronephrosis.  Ureters are decompressed.  Foley catheter is in place.  Small amount of air in the urinary bladder.  There are small bilateral pleural effusions and bibasilar atelectasis, new since prior study.  Liver, spleen, adrenals and stomach have an unremarkable unenhanced appearance.  Gallbladder is contracted.  Extensive calcifications throughout the atrophic pancreas compatible with chronic pancreatitis.  Extensive descending colonic and sigmoid diverticulosis.  No active diverticulitis.  Small bowel is decompressed.  No free fluid, free air or adenopathy.  Changes of prior right hip replacement on.  No acute bony abnormality.  IMPRESSION: Slight stranding along the mid  and lower poles of the left kidney compatible with slight hematoma from today's renal biopsy.  No significant subcapsular or perinephric hematoma.  Bilateral nephrolithiasis.  11 mm left renal pelvic stone.  No evidence of obstruction.  New small bilateral pleural effusions and bibasilar atelectasis.   Original Report Authenticated By: Rolm Baptise, M.D.   Ct Abdomen Pelvis Wo Contrast  09/26/2012   *RADIOLOGY REPORT*  Clinical Data: Acute renal insufficiency.  Question retroperitoneal obstruction.  CT ABDOMEN AND PELVIS WITHOUT CONTRAST  Technique:  Multidetector CT imaging of the abdomen and pelvis was performed following the standard protocol without intravenous contrast.  Comparison: 12/02/2010.  Findings: Lung  bases show no acute findings.  Heart size normal. No pericardial or pleural effusion.  Liver, gallbladder, and right adrenal gland are unremarkable.  Left adrenal gland appears minimally prominent, as before, stable. Stones are seen in the kidneys bilaterally, measuring up to 11 mm on the left.  Ureters are decompressed.  Spleen is unremarkable. Calcifications are seen in an atrophic pancreas.  No ductal dilatation.  Stomach and bowel are unremarkable.  Foley catheter is seen in a decompressed bladder.  Hysterectomy. Ovaries are visualized.  No free fluid.  Atherosclerotic calcification of the arterial vasculature without abdominal aortic aneurysm.  No pathologically enlarged lymph nodes. No worrisome lytic or sclerotic lesions.  Right hip arthroplasty.  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  IMPRESSION:  1.  Bilateral renal stones without obstruction. 2.  Mild prominence of the left adrenal gland, stable. 3.  Chronic calcific pancreatitis. 4.  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   Original Report Authenticated By: Lorin Picket, M.D.   Dg Chest 2 View  09/29/2012   *RADIOLOGY REPORT*  Clinical Data: Dyspnea and cough  CHEST - 2 VIEW  Comparison: 10/23/2012  Findings: There is a right sided IJ catheter with tip in the distal SVC.  Heart size is normal.  No pleural effusion or edema.  No airspace consolidation identified.  IMPRESSION:  1.  No acute cardiopulmonary abnormalities.   Original Report Authenticated By: Kerby Moors, M.D.   Nm Pulmonary Perfusion  10/03/2012   *RADIOLOGY REPORT*  Clinical Data: Shortness of breath.  NM PULMONARY PERFUSION PARTICULATE  Radiopharmaceutical: 6MILLI CURIE MAA TECHNETIUM TO 42M ALBUMIN AGGREGATED .  The patient refused ventilation portion of the study.  Comparison: Chest x-ray earlier today.  Findings: There are no segmental or subsegmental perfusion defects in the lungs.  Patchy nonsegmental perfusion defects, likely  related to obstructive airways disease.  IMPRESSION: Low probability study for pulmonary embolus.   Original Report Authenticated By: Rolm Baptise, M.D.   US Renal  09/24/2012   *RADIOLOGY REPORT*  Clinical Data: Elevated creatinine  RENAL/URINARY TRACT ULTRASOUND COMPLETE  Comparison:  09/02/2012  Findings:  Right Kidney:  10.5 cm in length.  The kidney is extremely echogenic consistent with medical renal disease and stable from prior exam.  Left Kidney:  10.8 cm in length.  Increased echogenicity is noted similar to that seen on prior study.  A tiny 6 mm cyst is noted. Echogenic focus in the lower pole of the left kidney consistent with a nonobstructing stone.  This is stable from prior exam.  Bladder:  Partially decompressed  IMPRESSION: No mass lesion or hydronephrosis is noted.  Increased echogenicity is identified consistent with medical renal disease.  Stable nonobstructing left renal stone   Original Report Authenticated By: Inez Catalina, M.D.   US Biopsy-no Radiologist  09/30/2012  CLINICAL DATA: deterding   Ultrasound was utilized for biopsy by the requesting physician.    Dg Chest Port 1 View  10/02/2012   *RADIOLOGY REPORT*  Clinical Data: Shortness of breath, chest pain.  PORTABLE CHEST - 1 VIEW  Comparison: 09/29/2012  Findings: Increasing density in the lung bases bilaterally, right greater than left.  This could represent asymmetric edema or infection.  Mild interstitial prominence and peribronchial thickening.  Heart is normal size.  Right dialysis catheters unchanged.  No visible effusions.  IMPRESSION: Increasing bilateral lower lung airspace opacities, right slightly greater than left as well as interstitial prominence.  Findings could represent edema or infection.   Original Report Authenticated By: Rolm Baptise, M.D.   Dg Chest Port 1 View  09/25/2012   *RADIOLOGY REPORT*  Clinical Data: Status post dialysis catheter placement  PORTABLE CHEST - 1 VIEW  Comparison: 04/24/2008  Findings:  There is a right chest wall dialysis catheter with tips in the distal SVC and cavoatrial junction. No pneumothorax identified.  Normal heart size.  No pleural effusion or edema.  No airspace consolidation identified.  IMPRESSION:  1.  No complications status post dialysis catheter placement.   Original Report Authenticated By: Kerby Moors, M.D.   Dg Fluoro Guide Cv Line-no Report  09/25/2012   CLINICAL DATA: placement dialysis catheter   FLOURO GUIDE CV LINE  Fluoroscopy was utilized by the requesting physician.  No radiographic  interpretation.       Microbiology: No results found for this or any previous visit (from the past 240 hour(s)).   Labs: Results for orders placed during the hospital encounter of 09/24/12 (from the past 48 hour(s))  GLUCOSE, CAPILLARY     Status: Abnormal   Collection Time    10/04/12  7:58 AM      Result Value Range   Glucose-Capillary 103 (*) 70 - 99 mg/dL  GLUCOSE, CAPILLARY     Status: Abnormal   Collection Time    10/04/12 11:56 AM      Result Value Range   Glucose-Capillary 143 (*) 70 - 99 mg/dL  CBC     Status: Abnormal   Collection Time    10/04/12  4:10 PM      Result Value Range   WBC 5.9  4.0 - 10.5 K/uL   RBC 3.00 (*) 3.87 - 5.11 MIL/uL   Hemoglobin 9.3 (*) 12.0 - 15.0 g/dL   HCT 26.1 (*) 36.0 - 46.0 %   MCV 87.0  78.0 - 100.0 fL   MCH 31.0  26.0 - 34.0 pg   MCHC 35.6  30.0 - 36.0 g/dL   RDW 12.1  11.5 - 15.5 %   Platelets 127 (*) 150 - 400 K/uL  GLUCOSE, CAPILLARY     Status: Abnormal   Collection Time    10/04/12  5:04 PM      Result Value Range   Glucose-Capillary 114 (*) 70 - 99 mg/dL  GLUCOSE, CAPILLARY     Status: Abnormal   Collection Time    10/04/12  8:33 PM      Result Value Range   Glucose-Capillary 141 (*) 70 - 99 mg/dL  BASIC METABOLIC PANEL     Status: Abnormal   Collection Time    10/05/12  6:25 AM      Result Value Range   Sodium 127 (*) 135 - 145 mEq/L   Potassium 3.6  3.5 - 5.1 mEq/L   Chloride 90 (*) 96 -  112 mEq/L   CO2  26  19 - 32 mEq/L   Glucose, Bld 94  70 - 99 mg/dL   BUN 64 (*) 6 - 23 mg/dL   Creatinine, Ser 5.90 (*) 0.50 - 1.10 mg/dL   Calcium 8.3 (*) 8.4 - 10.5 mg/dL   GFR calc non Af Amer 6 (*) >90 mL/min   GFR calc Af Amer 7 (*) >90 mL/min   Comment:            The eGFR has been calculated     using the CKD EPI equation.     This calculation has not been     validated in all clinical     situations.     eGFR's persistently     <90 mL/min signify     possible Chronic Kidney Disease.  CBC     Status: Abnormal   Collection Time    10/05/12  6:25 AM      Result Value Range   WBC 4.7  4.0 - 10.5 K/uL   RBC 2.73 (*) 3.87 - 5.11 MIL/uL   Hemoglobin 8.5 (*) 12.0 - 15.0 g/dL   HCT 23.6 (*) 36.0 - 46.0 %   MCV 86.4  78.0 - 100.0 fL   MCH 31.1  26.0 - 34.0 pg   MCHC 36.0  30.0 - 36.0 g/dL   RDW 12.0  11.5 - 15.5 %   Platelets 121 (*) 150 - 400 K/uL  GLUCOSE, CAPILLARY     Status: None   Collection Time    10/05/12  7:23 AM      Result Value Range   Glucose-Capillary 93  70 - 99 mg/dL  HEPATIC FUNCTION PANEL     Status: Abnormal   Collection Time    10/05/12 10:45 AM      Result Value Range   Total Protein 5.8 (*) 6.0 - 8.3 g/dL   Albumin 3.2 (*) 3.5 - 5.2 g/dL   AST 22  0 - 37 U/L   ALT 16  0 - 35 U/L   Alkaline Phosphatase 48  39 - 117 U/L   Total Bilirubin 0.4  0.3 - 1.2 mg/dL   Bilirubin, Direct <0.1  0.0 - 0.3 mg/dL   Indirect Bilirubin NOT CALCULATED  0.3 - 0.9 mg/dL  GLUCOSE, CAPILLARY     Status: Abnormal   Collection Time    10/05/12 11:24 AM      Result Value Range   Glucose-Capillary 138 (*) 70 - 99 mg/dL  SODIUM, URINE, RANDOM     Status: None   Collection Time    10/05/12 11:52 AM      Result Value Range   Sodium, Ur 71    OSMOLALITY, URINE     Status: Abnormal   Collection Time    10/05/12 11:52 AM      Result Value Range   Osmolality, Ur 347 (*) 390 - 1090 mOsm/kg  GLUCOSE, CAPILLARY     Status: Abnormal   Collection Time    10/05/12  5:05  PM      Result Value Range   Glucose-Capillary 121 (*) 70 - 99 mg/dL  OSMOLALITY     Status: None   Collection Time    10/05/12  7:55 PM      Result Value Range   Osmolality 286  275 - 300 mOsm/kg  GLUCOSE, CAPILLARY     Status: Abnormal   Collection Time    10/05/12  9:56 PM      Result Value Range   Glucose-Capillary  120 (*) 70 - 99 mg/dL  GLUCOSE, CAPILLARY     Status: None   Collection Time    10/06/12  7:12 AM      Result Value Range   Glucose-Capillary 86  70 - 99 mg/dL     HPI : 72 year old female with a hx of HTN, Ca breast cancer,DM diet controlled, hypothyroidism presented to the ED after receiving a call from her nephrologist that her creatinine has significantly worsened since May 2014. In ER, creatinine was 12.20. The patient saw her PCP Dr. Lawerance Cruel, and creatinine was 2.34 on 08/12/2012 as part of routine blood work. Labs redrawn on 08/22/2012 and her serum creatinine was noted to be 2.7.  The patient was referred to nephrology and saw Dr. Shirl Harris on 08/29/12. UA was bland but Cr was 4.2 on 08/29/2012. On 06/12, the patient had a serum creatinine of 6.41. Labs on 09/23/2012 showed a serum creatinine of 11.27 which prompted a call to have the patient go to the ED. A repeat renal ultrasound on 09/24/12 was negative for hydronephrosis. The patient denied any NSAID use however she takes a number of OTC supplements.  Nephrology was consulted and started the renal workup. ASO, hepatitis panel are negative, C3 slightly low. Patient has elevated urine free light chains which favour infiltrative renal disease, she is status post renal biopsy. Vascular surgery was also consulted and patient underwent hemodialysis catheter placement on 7/2. Patient underwent first hemodialysis on 09/25/12, then 7/5, still unclear if she is going to need permanent HD   HOSPITAL COURSE: * Hematuria status post renal biopsy on 7/7 Because of hematuria she had to be Started on continuous  bladder irrigation  Discussed with Dr. 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  Renal biopsy done 7/7 Platelet function assay not done as sample hemo lysed prior to renal biopsy  Slowly recovering renal function, holding off on hemodialysis,but Dr Posey Pronto would like to watch her till tuesday    Shortness of breath: Likely increasing pulmonary edema from IV fluids and blood, on 2 L of oxygen, VQ scan negative  Echo shows EF of 65-70%  PA pressure of 47 mm    Thrombocytopenia platelet count has been stable  Hypertension  - Currently stable continue Norvasc Hypothyroidism  -TSH 0.163, reduced home Synthroid dose to 115mcg    Diabetes mellitus2 : Diet controlled at home  -hemoglobin A1c 6.8 in 09/25/12,  - Placed on NovoLog sliding-scale qac and hs  Metabolic acidosis - improved, bicarbonate drip dc'd  history of Right breast cancer  -Dr. Jana Hakim is oncologist, in remission    Normocytic anemia  now with gross hematuria status post renal biopsy -Suspect due to renal disease, repeat anemia panel also shows iron level of 188, ferritin 196, TIBC 214  - Hemoglobin 6.0 upon admission, FOBT negative, status post transfusion of 2 units of packed red blood cells History of hemorrhoids: placed on hydrocortisone supp per request from patient  Hematuria postbiopsy now resolved Hemoglobin 9.5 prior to discharge    Discharge Exam:   Blood pressure 149/63, pulse 65, temperature 98.7 F (37.1 C), temperature source Oral, resp. rate 20, height 5\' 1"  (1.549 m), weight 59.1 kg (130 lb 4.7 oz), SpO2 94.00%.  General appearance: alert, cooperative and pale. Up in bed watching TV  Resp: clear to auscultation bilaterally.  Abdomen: Normal fullness, normal BS, non-tender, no rebound of guarding tenderness.  Cardio: S1, S2 normal and systolic murmur:  Extremities: extremities normal, 2+ pulses in BU&LE. No edema            Future Appointments Provider Department Dept Phone   08/28/2013 10:15 AM [place] [phone]   09/04/2013 1:30 PM Chauncey Cruel, MD [facility] [phone]      Follow-up Information   Schedule an appointment as soon as possible for a visit with Lawerance Cruel, MD.   Contact information:   [address] [place] [address] [phone]       Follow up with [facility]. Schedule an appointment as soon as possible for a visit in 1 week.      SignedReyne Dumas 10/06/2012, 7:56 AM

## 2012-10-06 NOTE — Progress Notes (Signed)
I have personally seen and examined this patient and agree with the assessment/plan as outlined above by Alice Rieger MD (PGY2). Renal function stable since yesterday. No HD needs at this time- would monitor for continued recovery and PO fluid restrict for hyponatremia. Plan to DC only after verifying that she has continued renal recovery. Ie. A consistent drop in creatinine.  Colleen Kotlarz K.,MD 10/06/2012 11:34 AM

## 2012-10-06 NOTE — Progress Notes (Signed)
Subjective: No overnight events. She has questions about her disposition. Objective: Vital signs in last 24 hours: Temp:  [97.7 F (36.5 C)-98.7 F (37.1 C)] 97.9 F (36.6 C) (07/13 0918) Pulse Rate:  [64-71] 71 (07/13 0918) Resp:  [16-20] 18 (07/13 0918) BP: (128-149)/(63-79) 134/79 mmHg (07/13 0918) SpO2:  [91 %-95 %] 95 % (07/13 0918) Weight:  [130 lb 4.7 oz (59.1 kg)] 130 lb 4.7 oz (59.1 kg) (07/12 2154) Weight change: -1 lb 1.6 oz (-0.5 kg)  Intake/Output from previous day: 07/12 0701 - 07/13 0700 In: 510 [P.O.:510] Out: 1500 [Urine:1500] Intake/Output this shift: Total I/O In: 120 [P.O.:120] Out: 400 [Urine:400] General appearance: alert, cooperative and pale. Up in chair watching TV Resp: clear to auscultation bilaterally. Abdomen: Normal fullness, normal BS, non-tender, no rebound of guarding tenderness.  Cardio: S1, S2 normal and systolic murmur: Extremities: extremities normal, 2+ pulses in BU&LE. No edema  Lab Results:  Recent Labs  10/05/12 0625 10/06/12 0752  WBC 4.7 6.4  HGB 8.5* 9.5*  HCT 23.6* 25.9*  PLT 121* 150   BMET:   Recent Labs  10/05/12 0625 10/06/12 0752  NA 127* 126*  K 3.6 3.5  CL 90* 88*  CO2 26 24  GLUCOSE 94 91  BUN 64* 66*  CREATININE 5.90* 5.96*  CALCIUM 8.3* 8.8    Studies/Results: No results found.   Scheduled Meds: . amLODipine  10 mg Oral Daily  . insulin aspart  0-5 Units Subcutaneous QHS  . insulin aspart  0-9 Units Subcutaneous TID WC  . levothyroxine  125 mcg Oral QAC breakfast  . LORazepam  4 mg Oral Once  . multivitamin  1 tablet Oral Daily  . pantoprazole  40 mg Oral Daily  . sevelamer carbonate  4.8 g Oral TID WC  . sodium chloride  3 mL Intravenous Q12H  . tamsulosin  0.4 mg Oral Daily  . zolpidem  5 mg Oral QHS   PRN Meds:.acetaminophen, acetaminophen, alum & mag hydroxide-simeth, benzonatate, camphor-menthol, feeding supplement (NEPRO CARB STEADY), heparin, HYDROmorphone (DILAUDID) injection,  lidocaine (PF), lidocaine-prilocaine, LORazepam, ondansetron (ZOFRAN) IV, ondansetron, oxyCODONE-acetaminophen, pentafluoroprop-tetrafluoroeth  I have reviewed the patient's current medications.  Assessment/Plan:  # AKI/CKD: Stable. Had HD on7/2 and 7/6 2/2 . Cr 6.31 >5.9. ?Infiltrative with elevated free light chains. 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 Elevated light chains on SPEP. Good UOP. Plan  - No acute need for HD - monitor for next few days  #Hyponatremia: Na 127 >>126. Asymptomatic. Clinically euvolemia / a little dry maybe. Not on diuretics. Serum osmolarity 286, urine osm 347, Urine Na 71. Labs consistent with SIADH. Might be related to hypothyroidism as well with TSH 0.163 on 7/2. Recent LFTs normal.  Plan Fluid restriction to 1200 cc/day Monitor with bmet  # Hypokalemia: resolved. K -3.0 > 3.6. Monitor.   # SOB: Resolved. Received on IV Lasix 80 mg once on 7/10 with good improvement. No clinical evidence of fluid overload.  Etiology unclear. V/Q scan low prob. Will continue to monitor.  # Hematuria: Resolved after continuous bladder irrigation. S/p left renal biopsy. CT abdominal 7/9 - no significant hematoma formation.   # Anemia. Stable. Related to acute blood loss from renal bx in setting of chronic anemia related to CKD. Iron panel without evidence of iron deficiency. Has required 2 units for PRBC. Hgb 9.3 >8.5. FOBT negative Plan  - daily CBC - Hgb goal >7   #Thrombocytopenia, stable as of 7/5. Abnormal platelet  function. Desmopressin pre-biopsy for prevention of bleeding.  #HTN: BP stable. Continue with Amlodipine 10 mg   LOS: 12 days   Signed:  Jessee Avers, MD PGY-5 Internal Medicine Teaching Service Pager: [phone] 10/06/2012, 10:24 AM

## 2012-10-07 LAB — BASIC METABOLIC PANEL
CO2: 26 mEq/L (ref 19–32)
Calcium: 8.3 mg/dL — ABNORMAL LOW (ref 8.4–10.5)
GFR calc non Af Amer: 7 mL/min — ABNORMAL LOW (ref >90)
Potassium: 3.4 mEq/L — ABNORMAL LOW (ref 3.5–5.1)
Sodium: 125 mEq/L — ABNORMAL LOW (ref 135–145)

## 2012-10-07 LAB — CBC
HCT: 26.7 % — ABNORMAL LOW (ref 36.0–46.0)
MCH: 31 pg (ref 26.0–34.0)
MCHC: 36.3 g/dL — ABNORMAL HIGH (ref 30.0–36.0)
MCV: 85.3 fL (ref 78.0–100.0)
RDW: 11.8 % (ref 11.5–15.5)

## 2012-10-07 LAB — GLUCOSE, CAPILLARY

## 2012-10-07 MED ORDER — POTASSIUM CITRATE ER 10 MEQ (1080 MG) PO TBCR
20.0000 meq | EXTENDED_RELEASE_TABLET | Freq: Two times a day (BID) | ORAL | Status: DC
  Administered 2012-10-07 – 2012-10-08 (×2): 20 meq via ORAL
  Filled 2012-10-07 (×3): qty 2

## 2012-10-07 MED ORDER — POTASSIUM CHLORIDE CRYS ER 20 MEQ PO TBCR
20.0000 meq | EXTENDED_RELEASE_TABLET | Freq: Two times a day (BID) | ORAL | Status: DC
  Administered 2012-10-07: 20 meq via ORAL
  Filled 2012-10-07: qty 1

## 2012-10-07 MED ORDER — AMLODIPINE BESYLATE 5 MG PO TABS
5.0000 mg | ORAL_TABLET | Freq: Every day | ORAL | Status: DC
  Administered 2012-10-08: 5 mg via ORAL
  Filled 2012-10-07: qty 1

## 2012-10-07 NOTE — Progress Notes (Signed)
Subjective:  Feels good. Did have blood in urine this AM but has now cleared.  UOP good and renal function improved- no HD since 7/5 Objective Vital signs in last 24 hours: Filed Vitals:   10/06/12 1721 10/06/12 2044 10/07/12 0544 10/07/12 0920  BP: 126/62 129/62 121/66 145/59  Pulse: 68 63 73 70  Temp: 98.6 F (37 C) 98.5 F (36.9 C) 98.5 F (36.9 C) 98.7 F (37.1 C)  TempSrc:  Oral Oral Oral  Resp: 17 17 17 18   Height:  5\' 1"  (1.549 m)    Weight:  59.101 kg (130 lb 4.7 oz)    SpO2: 93% 100% 95% 95%   Weight change: 0.001 kg (0 oz)  Intake/Output Summary (Last 24 hours) at 10/07/12 1302 Last data filed at 10/07/12 0700  Gross per 24 hour  Intake    340 ml  Output   1000 ml  Net   -660 ml   Labs: Basic Metabolic Panel:  Recent Labs Lab 10/05/12 0625 10/06/12 0752 10/07/12 0430  NA 127* 126* 125*  K 3.6 3.5 3.4*  CL 90* 88* 88*  CO2 26 24 26   GLUCOSE 94 91 94  BUN 64* 66* 66*  CREATININE 5.90* 5.96* 5.68*  CALCIUM 8.3* 8.8 8.3*   Liver Function Tests:  Recent Labs Lab 10/05/12 1045  AST 22  ALT 16  ALKPHOS 48  BILITOT 0.4  PROT 5.8*  ALBUMIN 3.2*   No results found for this basename: LIPASE, AMYLASE,  in the last 168 hours No results found for this basename: AMMONIA,  in the last 168 hours CBC:  Recent Labs Lab 10/04/12 0700 10/04/12 1610 10/05/12 0625 10/06/12 0752 10/07/12 1030  WBC 4.8 5.9 4.7 6.4 7.9  HGB 8.6* 9.3* 8.5* 9.5* 9.7*  HCT 24.1* 26.1* 23.6* 25.9* 26.7*  MCV 88.0 87.0 86.4 85.8 85.3  PLT 108* 127* 121* 150 170   Cardiac Enzymes:  Recent Labs Lab 10/02/12 2310  TROPONINI <0.30   CBG:  Recent Labs Lab 10/06/12 1133 10/06/12 1719 10/06/12 2035 10/07/12 0738 10/07/12 1141  GLUCAP 142* 162* 112* 94 148*    Iron Studies: No results found for this basename: IRON, TIBC, TRANSFERRIN, FERRITIN,  in the last 72 hours Studies/Results: No results found. Medications: Infusions:    Scheduled Medications: . [START ON  10/08/2012] amLODipine  5 mg Oral Daily  . insulin aspart  0-5 Units Subcutaneous QHS  . insulin aspart  0-9 Units Subcutaneous TID WC  . levothyroxine  125 mcg Oral QAC breakfast  . LORazepam  4 mg Oral Once  . multivitamin  1 tablet Oral Daily  . pantoprazole  40 mg Oral Daily  . potassium chloride  20 mEq Oral BID  . sevelamer carbonate  4.8 g Oral TID WC  . sodium chloride  3 mL Intravenous Q12H  . zolpidem  5 mg Oral QHS    have reviewed scheduled and prn medications.  Physical Exam: General: alert, mobile in room Heart: RRR Lungs: clear Abdomen: soft, non tender Extremities: no edema Dialysis Access: right sided PC still in place    Assessment/ Plan: Pt is a 72 y.o. yo female who was admitted on 09/24/2012 with AKI  Assessment/Plan: 1. 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  No HD required since 7/5 and seems to be recovering.  Will liberalize fluid intake to help with nephrocalcinosis and also add potassium citrate.  Trying to avoid further insults so will decrease norvasc to avoid  hypotension. If creatinine is down again tomorrow is OK for discharge.  I will keep PC in for another few days and D/C as OP.  2. Hyponatremia- inappropriate high urine osm but difficult to determine due to abnormal urine all around.  Also could be related to thyroid? I feel like I need to liberalize her fluid due to the nephrocalcinosis piece.   3. Anemia- improved and steady- thought to be due to kidney biopsy 4. HTN/volume- have decreased norvasc 5. Hematuria-not painful-  due to kidney biopsy vs nephrocalcinosis- has cleared  Dawit Tankard A   10/07/2012,1:02 PM  LOS: 13 days

## 2012-10-07 NOTE — Progress Notes (Signed)
[facility] PROGRESS NOTE  Anita Pollard [id number] DOB: [birth year] DOA: 09/24/2012 PCP: Lawerance Cruel, MD  Assessment/Plan: Active Problems:   HYPERTENSION, BENIGN   Breast cancer   Anemia, unspecified   DM2 (diabetes mellitus, type 2)   AKI (acute kidney injury)      HPI :  72 year old female with a hx of HTN, Ca breast cancer,DM diet controlled, hypothyroidism presented to the ED after receiving a call from her nephrologist that her creatinine has significantly worsened since May 2014. In ER, creatinine was 12.20. The patient saw her PCP Dr. Lawerance Cruel, and creatinine was 2.34 on 08/12/2012 as part of routine blood work. Labs redrawn on 08/22/2012 and her serum creatinine was noted to be 2.7.  The patient was referred to nephrology and saw Dr. Shirl Harris on 08/29/12. UA was bland but Cr was 4.2 on 08/29/2012. On 06/12, the patient had a serum creatinine of 6.41. Labs on 09/23/2012 showed a serum creatinine of 11.27 which prompted a call to have the patient go to the ED. A repeat renal ultrasound on 09/24/12 was negative for hydronephrosis. The patient denied any NSAID use however she takes a number of OTC supplements.  Nephrology was consulted and started the renal workup. ASO, hepatitis panel are negative, C3 slightly low. Patient has elevated urine free light chains which favour infiltrative renal disease, she is status post renal biopsy. Vascular surgery was also consulted and patient underwent hemodialysis catheter placement on 7/2. Patient underwent first hemodialysis on 09/25/12, then 7/5, still unclear if she is going to need permanent HD    HOSPITAL COURSE: *  Hematuria status post renal biopsy on 7/7  Because of hematuria she had to be Started on continuous bladder irrigation  Discussed with Dr. 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  Renal biopsy done 7/7  Platelet function assay not done as sample hemo lysed prior to renal biopsy  Slowly recovering renal function, holding off on hemodialysis,but Dr Posey Pronto would like to watch her till tuesday    Shortness of breath: Likely increasing pulmonary edema from IV fluids and blood, on 2 L of oxygen, VQ scan negative  Echo shows EF of 65-70%  PA pressure of 47 mm   Hypokalemia Replete  Thrombocytopenia platelet count has been stable  Hypertension  - Currently stable continue Norvasc   Hypothyroidism  -TSH 0.163, reduced home Synthroid dose to 162mcg   Diabetes mellitus2 : Diet controlled at home  -hemoglobin A1c 6.8 in 09/25/12,  - Placed on NovoLog sliding-scale qac and hs  Metabolic acidosis - improved, bicarbonate drip dc'd  history  of Right breast cancer  -Dr. Jana Hakim is oncologist, in remission  Normocytic anemia now with gross hematuria status post renal biopsy -Suspect due to renal disease, repeat anemia panel also shows iron level of 188, ferritin 196, TIBC 214  -  Hemoglobin 6.0 upon admission, FOBT negative, status post transfusion of 2 units of packed red blood cells  History of hemorrhoids: placed on hydrocortisone supp per request from patient  Hematuria postbiopsy now resolved  Hemoglobin 9.5 prior to discharge      HPI/Subjective: Doing well and no complaints  Objective: Filed Vitals:   10/06/12 1344 10/06/12 1721 10/06/12 2044 10/07/12 0544  BP: 128/55 126/62 129/62 121/66  Pulse: 65 68 63 73  Temp: 98.1 F (36.7 C) 98.6 F (37 C) 98.5 F (36.9 C) 98.5 F (36.9 C)  TempSrc:   Oral Oral  Resp: 17 17 17 17   Height:   5\' 1"  (1.549 m)   Weight:   59.101 kg (130 lb 4.7 oz)   SpO2: 96% 93% 100% 95%    Intake/Output Summary (Last 24 hours) at 10/07/12 0739 Last data filed at 10/07/12 0549  Gross per 24 hour  Intake    460 ml  Output   1400 ml  Net   -940 ml    Exam:  General appearance: alert, cooperative and pale. Up in chair watching TV  Resp: clear to auscultation bilaterally.  Abdomen: Normal fullness, normal BS, non-tender, no rebound of guarding tenderness.  Cardio: S1, S2 normal and systolic murmur:  Extremities: extremities normal, 2+ pulses in BU&LE. No edema   Data Reviewed: Basic Metabolic Panel:  Recent Labs Lab 10/03/12 1418 10/04/12 0700 10/05/12 0625 10/06/12 0752 10/07/12 0430  NA 130* 131* 127* 126* 125*  K 3.6 3.0* 3.6 3.5 3.4*  CL 91* 92* 90* 88* 88*  CO2 25 24 26 24 26   GLUCOSE 160* 100* 94 91 94  BUN 63* 64* 64* 66* 66*  CREATININE 6.31* 6.31* 5.90* 5.96* 5.68*  CALCIUM 8.8 8.7 8.3* 8.8 8.3*    Liver Function Tests:  Recent Labs Lab 10/05/12 1045  AST 22  ALT 16  ALKPHOS 48  BILITOT 0.4  PROT 5.8*  ALBUMIN 3.2*   No results found for this basename: LIPASE, AMYLASE,  in the last 168 hours No results found for this basename: AMMONIA,  in the last 168 hours  CBC:  Recent Labs Lab 10/02/12 0645  10/02/12 2310 10/04/12 0700 10/04/12 1610 10/05/12 0625 10/06/12 0752  WBC  6.1  --   --  4.8 5.9 4.7 6.4  HGB 8.4*  < > 8.7* 8.6* 9.3* 8.5* 9.5*  HCT 24.0*  < > 24.9* 24.1* 26.1* 23.6* 25.9*  MCV 87.3  --   --  88.0 87.0 86.4 85.8  PLT 98*  --   --  108* 127* 121* 150  < > = values in this interval not displayed.  Cardiac Enzymes:  Recent Labs Lab 10/02/12 2310  TROPONINI <0.30   BNP (last 3 results) No results found for this basename: PROBNP,  in the last 8760 hours   CBG:  Recent Labs Lab 10/05/12 2156 10/06/12 0712 10/06/12 1133 10/06/12 1719 10/06/12 2035  GLUCAP 120* 86 142* 162* 112*    No results found for this or any previous visit (from the past 240 hour(s)).   Studies: Ct Abdomen Pelvis Wo Contrast  10/01/2012   *RADIOLOGY REPORT*  Clinical Data: Hematuria status post biopsy.  CT ABDOMEN AND PELVIS WITHOUT CONTRAST  Technique:  Multidetector  CT imaging of the abdomen and pelvis was performed following the standard protocol without intravenous contrast.  Comparison: Ultrasound guidance images performed today.  CT 09/26/2012.  Findings: There is a small amount of perinephric stranding around the mid and lower pole of the left kidney, likely small hematoma associated with today's renal biopsy.  No significant subcapsular or perinephric hematoma.  Again noted is the 11 mm renal pelvic stone and other nonobstructing stones in the kidneys bilaterally. No hydronephrosis.  Ureters are decompressed.  Foley catheter is in place.  Small amount of air in the urinary bladder.  There are small bilateral pleural effusions and bibasilar atelectasis, new since prior study.  Liver, spleen, adrenals and stomach have an unremarkable unenhanced appearance.  Gallbladder is contracted.  Extensive calcifications throughout the atrophic pancreas compatible with chronic pancreatitis.  Extensive descending colonic and sigmoid diverticulosis.  No active diverticulitis.  Small bowel is decompressed.  No free fluid, free air or adenopathy.  Changes of prior right hip  replacement on.  No acute bony abnormality.  IMPRESSION: Slight stranding along the mid and lower poles of the left kidney compatible with slight hematoma from today's renal biopsy.  No significant subcapsular or perinephric hematoma.  Bilateral nephrolithiasis.  11 mm left renal pelvic stone.  No evidence of obstruction.  New small bilateral pleural effusions and bibasilar atelectasis.   Original Report Authenticated By: Rolm Baptise, M.D.   Ct Abdomen Pelvis Wo Contrast  09/26/2012   *RADIOLOGY REPORT*  Clinical Data: Acute renal insufficiency.  Question retroperitoneal obstruction.  CT ABDOMEN AND PELVIS WITHOUT CONTRAST  Technique:  Multidetector CT imaging of the abdomen and pelvis was performed following the standard protocol without intravenous contrast.  Comparison: 12/02/2010.  Findings: Lung bases show no acute findings.  Heart size normal. No pericardial or pleural effusion.  Liver, gallbladder, and right adrenal gland are unremarkable.  Left adrenal gland appears minimally prominent, as before, stable. Stones are seen in the kidneys bilaterally, measuring up to 11 mm on the left.  Ureters are decompressed.  Spleen is unremarkable. Calcifications are seen in an atrophic pancreas.  No ductal dilatation.  Stomach and bowel are unremarkable.  Foley catheter is seen in a decompressed bladder.  Hysterectomy. Ovaries are visualized.  No free fluid.  Atherosclerotic calcification of the arterial vasculature without abdominal aortic aneurysm.  No pathologically enlarged lymph nodes. No worrisome lytic or sclerotic lesions.  Right hip arthroplasty.  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  IMPRESSION:  1.  Bilateral renal stones without obstruction. 2.  Mild prominence of the left adrenal gland, stable. 3.  Chronic calcific pancreatitis. 4.  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   Original Report Authenticated By: Lorin Picket, M.D.   Dg Chest 2 View  09/29/2012    *RADIOLOGY REPORT*  Clinical Data: Dyspnea and cough  CHEST - 2 VIEW  Comparison: 10/23/2012  Findings: There is a right sided IJ catheter with tip in the distal SVC.  Heart size is normal.  No pleural effusion or edema.  No airspace consolidation identified.  IMPRESSION:  1.  No acute cardiopulmonary abnormalities.   Original Report Authenticated By: Kerby Moors, M.D.   Nm Pulmonary Perfusion  10/03/2012   *RADIOLOGY REPORT*  Clinical Data: Shortness of breath.  NM PULMONARY PERFUSION PARTICULATE  Radiopharmaceutical: 6MILLI CURIE MAA TECHNETIUM TO 59M ALBUMIN AGGREGATED .  The patient refused ventilation portion of the study.  Comparison: Chest x-ray earlier today.  Findings: There are no  segmental or subsegmental perfusion defects in the lungs.  Patchy nonsegmental perfusion defects, likely related to obstructive airways disease.  IMPRESSION: Low probability study for pulmonary embolus.   Original Report Authenticated By: Rolm Baptise, M.D.   US Renal  09/24/2012   *RADIOLOGY REPORT*  Clinical Data: Elevated creatinine  RENAL/URINARY TRACT ULTRASOUND COMPLETE  Comparison:  09/02/2012  Findings:  Right Kidney:  10.5 cm in length.  The kidney is extremely echogenic consistent with medical renal disease and stable from prior exam.  Left Kidney:  10.8 cm in length.  Increased echogenicity is noted similar to that seen on prior study.  A tiny 6 mm cyst is noted. Echogenic focus in the lower pole of the left kidney consistent with a nonobstructing stone.  This is stable from prior exam.  Bladder:  Partially decompressed  IMPRESSION: No mass lesion or hydronephrosis is noted.  Increased echogenicity is identified consistent with medical renal disease.  Stable nonobstructing left renal stone   Original Report Authenticated By: Inez Catalina, M.D.   US Biopsy-no Radiologist  09/30/2012   CLINICAL DATA: deterding   Ultrasound was utilized for biopsy by the requesting physician.    Dg Chest Port 1 View  10/02/2012    *RADIOLOGY REPORT*  Clinical Data: Shortness of breath, chest pain.  PORTABLE CHEST - 1 VIEW  Comparison: 09/29/2012  Findings: Increasing density in the lung bases bilaterally, right greater than left.  This could represent asymmetric edema or infection.  Mild interstitial prominence and peribronchial thickening.  Heart is normal size.  Right dialysis catheters unchanged.  No visible effusions.  IMPRESSION: Increasing bilateral lower lung airspace opacities, right slightly greater than left as well as interstitial prominence.  Findings could represent edema or infection.   Original Report Authenticated By: Rolm Baptise, M.D.   Dg Chest Port 1 View  09/25/2012   *RADIOLOGY REPORT*  Clinical Data: Status post dialysis catheter placement  PORTABLE CHEST - 1 VIEW  Comparison: 04/24/2008  Findings: There is a right chest wall dialysis catheter with tips in the distal SVC and cavoatrial junction. No pneumothorax identified.  Normal heart size.  No pleural effusion or edema.  No airspace consolidation identified.  IMPRESSION:  1.  No complications status post dialysis catheter placement.   Original Report Authenticated By: Kerby Moors, M.D.   Dg Fluoro Guide Cv Line-no Report  09/25/2012   CLINICAL DATA: placement dialysis catheter   FLOURO GUIDE CV LINE  Fluoroscopy was utilized by the requesting physician.  No radiographic  interpretation.     Scheduled Meds: . amLODipine  10 mg Oral Daily  . insulin aspart  0-5 Units Subcutaneous QHS  . insulin aspart  0-9 Units Subcutaneous TID WC  . levothyroxine  125 mcg Oral QAC breakfast  . LORazepam  4 mg Oral Once  . multivitamin  1 tablet Oral Daily  . pantoprazole  40 mg Oral Daily  . potassium chloride  20 mEq Oral BID  . sevelamer carbonate  4.8 g Oral TID WC  . sodium chloride  3 mL Intravenous Q12H  . zolpidem  5 mg Oral QHS   Continuous Infusions:   Active Problems:   HYPERTENSION, BENIGN   Breast cancer   Anemia, unspecified   DM2 (diabetes  mellitus, type 2)   AKI (acute kidney injury)    Time spent: 47 minutes   [facility] Pager [phone]. If 8PM-8AM, please contact night-coverage at [email or web address], password [facility] 10/07/2012, 7:39 AM  LOS: 13 days

## 2012-10-08 DIAGNOSIS — E876 Hypokalemia: Secondary | ICD-10-CM

## 2012-10-08 LAB — CBC
HCT: 25.8 % — ABNORMAL LOW (ref 36.0–46.0)
MCHC: 36 g/dL (ref 30.0–36.0)
MCV: 85.4 fL (ref 78.0–100.0)
RDW: 12 % (ref 11.5–15.5)

## 2012-10-08 LAB — BASIC METABOLIC PANEL
BUN: 68 mg/dL — ABNORMAL HIGH (ref 6–23)
CO2: 25 mEq/L (ref 19–32)
Chloride: 88 mEq/L — ABNORMAL LOW (ref 96–112)
Creatinine, Ser: 5.64 mg/dL — ABNORMAL HIGH (ref 0.50–1.10)

## 2012-10-08 LAB — GLUCOSE, CAPILLARY
Glucose-Capillary: 92 mg/dL (ref 70–99)
Glucose-Capillary: 130 mg/dL — ABNORMAL HIGH (ref 70–99)

## 2012-10-08 MED ORDER — POTASSIUM CITRATE ER 10 MEQ (1080 MG) PO TBCR: 20.0000 meq | EXTENDED_RELEASE_TABLET | Freq: Two times a day (BID) | ORAL | Status: DC

## 2012-10-08 MED ORDER — SEVELAMER CARBONATE 2.4 G PO PACK: 4.8000 g | PACK | Freq: Three times a day (TID) | ORAL | Status: DC

## 2012-10-08 MED ORDER — TAMSULOSIN HCL 0.4 MG PO CAPS: 0.4000 mg | ORAL_CAPSULE | Freq: Every day | ORAL | Status: DC

## 2012-10-08 MED ORDER — LEVOTHYROXINE SODIUM 125 MCG PO TABS: 125.0000 ug | ORAL_TABLET | Freq: Every day | ORAL | Status: DC

## 2012-10-08 MED ORDER — AMLODIPINE BESYLATE 5 MG PO TABS: 5.0000 mg | ORAL_TABLET | Freq: Every day | ORAL | Status: DC

## 2012-10-08 NOTE — Discharge Summary (Signed)
Physician Discharge Summary  Anita Pollard [id number] DOB: 1940-11-18 DOA: 09/24/2012  PCP: Lawerance Cruel, MD  Admit date: 09/24/2012 Discharge date: 10/08/2012  Time spent: >30 minutes  Recommendations for Outpatient Follow-up:  BMET to follow kidney function and electrolytes Reassess BP and adjust medication as needed Recheck thyroid function test in 3-4 weeks and adjust medications as needed. CBC to follow Hgb level  Discharge Diagnoses:  Active Problems:   HYPERTENSION, BENIGN   Breast cancer   Anemia, unspecified   DM2 (diabetes mellitus, type 2)   AKI (acute kidney injury) hypothyroidism Grade 1 diastolic dysfunction Tricuspid regurgitation Hematuria (after kidney biopsy)  Discharge Condition: Stable and improved. Will discharge home; follow up with PCP and close follow up with renal service as an outpatient.  Diet recommendation: heart healthy diet  Filed Weights   10/05/12 2154 10/06/12 2044 10/07/12 2034  Weight: 59.1 kg (130 lb 4.7 oz) 59.101 kg (130 lb 4.7 oz) 59.101 kg (130 lb 4.7 oz)    History of present illness:  72 year old female with a history of hypertension, breast cancer, diabetes mellitus diet controlled, hypothyroidism presents to the hospital after receiving a call from her nephrologist regarding abnormal blood work. Apparently, the patient's serum creatinine has significantly worsened since May 2014. In emergency department today, the patient was found to have a serum creatinine of 12.20. The patient saw her primary care physician, Dr. Lawerance Cruel, and had a serum creatinine of 2.34 on 08/12/2012. This was part of routine blood work. The patient had labs were gone on 08/22/2012 and her serum creatinine was noted to be 2.7. The patient was referred to see nephrology. She saw Dr. Shirl Harris on 08/29/12. Urinalysis at that time was bland. She was noted to have serum creatinine of 4.2 on 08/29/2012. On 06/12, the patient had a serum creatinine of  6.41. Labs on 09/23/2012 showed a serum creatinine of 11.27 which prompted a call to have the patient go to the ED. She had a renal ultrasound on 09/18/2012 that showed bilateral hydronephrosis. She had a repeat ultrasound at urology on 09/19/2012 which did not show any hydronephrosis. A repeat ultrasound today of the kidneys also was negative for hydronephrosis. The patient denies any NSAID use. She takes a number of OTC supplements. Denies any illegal drug use.  The patient's only complaint is that of some itching on her upper back and upper chest that began 3 days prior to admission. She denies any fevers, chills, chest discomfort, shortness breath, nausea, vomiting, diarrhea, abdominal pain, dysuria, hematuria, orthopnea, PND. She has some ankle edema which is unchanged.  The patient states that she has taken herself off of all her medications including her amlodipine for the past 2 weeks. The only medication she is taking is her Synthroid.  In the ED, the patient was found to have WBC 6.1, hemoglobin 8.1, platelets 136, unremarkable hepatic panel, potassium 3.5, bicarbonate 11, creatinine 12.20.   Hospital Course:  Hematuria status post renal biopsy on 7/7  -Because of hematuria she had to be Started on continuous bladder irrigation during this admission. -Patient received desmopressin IV pre-and post renal biopsy, hyponatremia and hypertension as side effects of desmopressin were seen  -Has received 2 units of packed red blood cells because of bleeding  -No evidence of perinephric hematoma on CT scan done after the procedure  -Discontinued aspirin  -hematuria stopped and at discharge no further bleeding and good urine output.  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  -Cr continue slowly improving; no uremic symptoms or volume overload.  Shortness of breath: Likely increasing pulmonary edema from IV fluids and blood. -Required transient 2L of oxygen supplementation, VQ scan negative  -Echo shows EF of 65-70% and grade 1 diastolic dysfunction; PA pressure of 47 mm  -At discharge good O2 sat on RA and no further resp distress. -advise to follow heart healthy diet  Hypokalemia  Repleted   Thrombocytopenia platelet count has been stable and no signs of overtbleeding  Hypertension  - Currently stable continue Norvasc (5mg  daily)  Hypothyroidism  -TSH 0.163, reduced home Synthroid dose to 165mcg  -follow up as an outpatient  Diabetes mellitus2 : Diet controlled at home  -hemoglobin A1c 6.8 in 09/25/12,  - follow up as an outpatient  Metabolic acidosis:due to renal failure  -stable. Bicarb 25 at discharge  history of Right breast cancer  -Dr. Jana Hakim is oncologist, in remission -continue outpatient follow up  Normocytic anemia now with gross hematuria status post renal biopsy -Suspect due to renal disease, repeat anemia panel also shows iron level of 188, ferritin 196, TIBC 214  - Hemoglobin 6.0 upon admission, FOBT negative, status post transfusion of 2 units of packed red blood cells. -Hematuria postbiopsy now resolved  -Hemoglobin 9.5 prior to discharge   *Rest of medical problems remains stable. Will continue current medication regimen and patient will follow with PCP for further medication adjustments.   Procedures:  Renal biopsy 09/30/12 (showed diffuse severe ATN with tubular  Ca-Oxalate deposition).  2-D Echo (Systolic function was vigorous. The estimated ejection fraction was in the range of 65% to 70%. Wall motion was normal; there were no regional wall motion abnormalities. Doppler parameters are consistent with abnormal left ventricular relaxation (grade 1 diastolic dysfunction). Mild Regurgitation of tricuspid valve.  See below for x-ray reports   Consultations:  Renal service  Discharge Exam: Filed Vitals:   10/07/12 1734 10/07/12 2034 10/08/12 0434 10/08/12 0909  BP: 138/55 131/57 150/57 140/53  Pulse: 66 75 69 72  Temp: 98.6 F (37 C) 97.8 F (36.6 C) 98 F (36.7 C) 97.5 F (36.4 C)  TempSrc: Oral Oral Oral   Resp: 18 18 18 17   Height:  5\' 1"  (1.549 m)    Weight:  59.101 kg (130 lb 4.7 oz)    SpO2: 96% 98% 97% 95%    General: NAD, afebrile; AAOX3 Cardiovascular: S1 and S2, no rubs or gallops; positive SEM tricuspid area (soft 2/6) Respiratory: CTA bilaterally Extremities: trace edema bilaterally, no cyanosis Neuro: non focal motor or sensory deficit. CN intact  Discharge Instructions  Discharge Orders   Future Appointments Provider Department Dept Phone   08/28/2013 10:15 AM [facility] [id number]   09/04/2013 1:30 PM Chauncey Cruel, MD [facility] [phone]   Future Orders Complete By Expires     Discharge instructions  As directed     Comments:      Keep yourself well hydrated Take medications as  prescribed Synthroid to be taken every morning empty stomach and at least 40 minutes away from other medications Arrange follow up with PCP in 2 weeks Follow appointment with renal service as instructed Follow a heart healthy diet        Medication List    STOP taking these medications       aspirin 81 MG tablet     calcium carbonate 600 MG Tabs  Commonly known as:  OS-CAL     magnesium oxide 400 MG tablet  Commonly known as:  MAG-OX      Vitamin D-3 5000 UNITS Tabs      TAKE these medications       amLODipine 5 MG tablet  Commonly known as:  NORVASC  Take 1 tablet (5 mg total) by mouth daily.     b complex vitamins tablet  Take 1 tablet by mouth daily.     co-enzyme Q-10 30 MG capsule  Take 300 mg by mouth daily.     diphenhydrAMINE 25 MG tablet  Commonly known as:  BENADRYL  Take 25 mg by mouth at bedtime as needed.     GLUCOSAMINE 1500 COMPLEX Caps  Take by mouth 3 (three) times daily.     Krill Oil 1000 MG Caps  Take one tablet daily     levothyroxine 125 MCG tablet  Commonly known as:  SYNTHROID, LEVOTHROID  Take 1 tablet (125 mcg total) by mouth daily.     multivitamin tablet  Take 1 tablet by mouth daily.     omeprazole 20 MG capsule  Commonly known as:  PRILOSEC  Take 20 mg by mouth daily.     potassium citrate 10 MEQ (1080 MG) SR tablet  Commonly known as:  UROCIT-K  Take 2 tablets (20 mEq total) by mouth 2 (two) times daily.     PROBIOTIC DAILY Caps  Take one capsule every other day     sevelamer carbonate 2.4 G Pack  Commonly known as:  RENVELA  Take 4.8 g by mouth 3 (three) times daily with meals.     tamsulosin 0.4 MG Caps  Commonly known as:  FLOMAX  Take 1 capsule (0.4 mg total) by mouth daily.       Allergies  Allergen Reactions  . Detrol (Tolterodine)     Leg cramps  . Penicillins Swelling       Follow-up Information   Follow up with Lawerance Cruel, MD. Schedule an appointment as soon as possible for a visit in 2 weeks.   Contact information:   [address] [place] [address] [phone]       Follow up with Louis Meckel, MD On 10/24/2012. (appt at 10:45 AM)    Contact information:   [address] [place] [address] [phone]       Follow up with Louis Meckel, MD On 10/11/2012. (labs at [facility])    Contact information:   [address] [place] [address] [phone]       The results of significant diagnostics from this  hospitalization (including imaging, microbiology, ancillary and laboratory) are listed below for reference.    Significant Diagnostic Studies: Ct Abdomen Pelvis Wo Contrast  10/01/2012   *RADIOLOGY REPORT*  Clinical Data: Hematuria status post biopsy.  CT ABDOMEN AND PELVIS WITHOUT CONTRAST  Technique:  Multidetector CT imaging of the abdomen and pelvis was performed following the standard protocol without intravenous contrast.  Comparison: Ultrasound guidance images performed today.  CT 09/26/2012.  Findings: There is a  small amount of perinephric stranding around the mid and lower pole of the left kidney, likely small hematoma associated with today's renal biopsy.  No significant subcapsular or perinephric hematoma.  Again noted is the 11 mm renal pelvic stone and other nonobstructing stones in the kidneys bilaterally. No hydronephrosis.  Ureters are decompressed.  Foley catheter is in place.  Small amount of air in the urinary bladder.  There are small bilateral pleural effusions and bibasilar atelectasis, new since prior study.  Liver, spleen, adrenals and stomach have an unremarkable unenhanced appearance.  Gallbladder is contracted.  Extensive calcifications throughout the atrophic pancreas compatible with chronic pancreatitis.  Extensive descending colonic and sigmoid diverticulosis.  No active diverticulitis.  Small bowel is decompressed.  No free fluid, free air or adenopathy.  Changes of prior right hip replacement on.  No acute bony abnormality.  IMPRESSION: Slight stranding along the mid and lower poles of the left kidney compatible with slight hematoma from today's renal biopsy.  No significant subcapsular or perinephric hematoma.  Bilateral nephrolithiasis.  11 mm left renal pelvic stone.  No evidence of obstruction.  New small bilateral pleural effusions and bibasilar atelectasis.   Original Report Authenticated By: Rolm Baptise, M.D.   Ct Abdomen Pelvis Wo Contrast  09/26/2012   *RADIOLOGY  REPORT*  Clinical Data: Acute renal insufficiency.  Question retroperitoneal obstruction.  CT ABDOMEN AND PELVIS WITHOUT CONTRAST  Technique:  Multidetector CT imaging of the abdomen and pelvis was performed following the standard protocol without intravenous contrast.  Comparison: 12/02/2010.  Findings: Lung bases show no acute findings.  Heart size normal. No pericardial or pleural effusion.  Liver, gallbladder, and right adrenal gland are unremarkable.  Left adrenal gland appears minimally prominent, as before, stable. Stones are seen in the kidneys bilaterally, measuring up to 11 mm on the left.  Ureters are decompressed.  Spleen is unremarkable. Calcifications are seen in an atrophic pancreas.  No ductal dilatation.  Stomach and bowel are unremarkable.  Foley catheter is seen in a decompressed bladder.  Hysterectomy. Ovaries are visualized.  No free fluid.  Atherosclerotic calcification of the arterial vasculature without abdominal aortic aneurysm.  No pathologically enlarged lymph nodes. No worrisome lytic or sclerotic lesions.  Right hip arthroplasty.  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  IMPRESSION:  1.  Bilateral renal stones without obstruction. 2.  Mild prominence of the left adrenal gland, stable. 3.  Chronic calcific pancreatitis. 4.  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   Original Report Authenticated By: Lorin Picket, M.D.   Dg Chest 2 View  09/29/2012   *RADIOLOGY REPORT*  Clinical Data: Dyspnea and cough  CHEST - 2 VIEW  Comparison: 10/23/2012  Findings: There is a right sided IJ catheter with tip in the distal SVC.  Heart size is normal.  No pleural effusion or edema.  No airspace consolidation identified.  IMPRESSION:  1.  No acute cardiopulmonary abnormalities.   Original Report Authenticated By: Kerby Moors, M.D.   Nm Pulmonary Perfusion  10/03/2012   *RADIOLOGY REPORT*  Clinical Data: Shortness of breath.  NM PULMONARY PERFUSION  PARTICULATE  Radiopharmaceutical: 6MILLI CURIE MAA TECHNETIUM TO 78M ALBUMIN AGGREGATED .  The patient refused ventilation portion of the study.  Comparison: Chest x-ray earlier today.  Findings: There are no segmental or subsegmental perfusion defects in the lungs.  Patchy nonsegmental perfusion defects, likely related to obstructive airways disease.  IMPRESSION: Low probability study for pulmonary embolus.   Original Report  Authenticated By: Rolm Baptise, M.D.   US Renal  09/24/2012   *RADIOLOGY REPORT*  Clinical Data: Elevated creatinine  RENAL/URINARY TRACT ULTRASOUND COMPLETE  Comparison:  09/02/2012  Findings:  Right Kidney:  10.5 cm in length.  The kidney is extremely echogenic consistent with medical renal disease and stable from prior exam.  Left Kidney:  10.8 cm in length.  Increased echogenicity is noted similar to that seen on prior study.  A tiny 6 mm cyst is noted. Echogenic focus in the lower pole of the left kidney consistent with a nonobstructing stone.  This is stable from prior exam.  Bladder:  Partially decompressed  IMPRESSION: No mass lesion or hydronephrosis is noted.  Increased echogenicity is identified consistent with medical renal disease.  Stable nonobstructing left renal stone   Original Report Authenticated By: Inez Catalina, M.D.   US Biopsy-no Radiologist  09/30/2012   CLINICAL DATA: deterding   Ultrasound was utilized for biopsy by the requesting physician.    Dg Chest Port 1 View  10/02/2012   *RADIOLOGY REPORT*  Clinical Data: Shortness of breath, chest pain.  PORTABLE CHEST - 1 VIEW  Comparison: 09/29/2012  Findings: Increasing density in the lung bases bilaterally, right greater than left.  This could represent asymmetric edema or infection.  Mild interstitial prominence and peribronchial thickening.  Heart is normal size.  Right dialysis catheters unchanged.  No visible effusions.  IMPRESSION: Increasing bilateral lower lung airspace opacities, right slightly greater than  left as well as interstitial prominence.  Findings could represent edema or infection.   Original Report Authenticated By: Rolm Baptise, M.D.   Dg Chest Port 1 View  09/25/2012   *RADIOLOGY REPORT*  Clinical Data: Status post dialysis catheter placement  PORTABLE CHEST - 1 VIEW  Comparison: 04/24/2008  Findings: There is a right chest wall dialysis catheter with tips in the distal SVC and cavoatrial junction. No pneumothorax identified.  Normal heart size.  No pleural effusion or edema.  No airspace consolidation identified.  IMPRESSION:  1.  No complications status post dialysis catheter placement.   Original Report Authenticated By: Kerby Moors, M.D.   Dg Fluoro Guide Cv Line-no Report  09/25/2012   CLINICAL DATA: placement dialysis catheter   FLOURO GUIDE CV LINE  Fluoroscopy was utilized by the requesting physician.  No radiographic  interpretation.    Labs: Basic Metabolic Panel:  Recent Labs Lab 10/04/12 0700 10/05/12 0625 10/06/12 0752 10/07/12 0430 10/08/12 0555  NA 131* 127* 126* 125* 124*  K 3.0* 3.6 3.5 3.4* 4.3  CL 92* 90* 88* 88* 88*  CO2 24 26 24 26 25   GLUCOSE 100* 94 91 94 88  BUN 64* 64* 66* 66* 68*  CREATININE 6.31* 5.90* 5.96* 5.68* 5.64*  CALCIUM 8.7 8.3* 8.8 8.3* 8.7   Liver Function Tests:  Recent Labs Lab 10/05/12 1045  AST 22  ALT 16  ALKPHOS 48  BILITOT 0.4  PROT 5.8*  ALBUMIN 3.2*   CBC:  Recent Labs Lab 10/04/12 1610 10/05/12 0625 10/06/12 0752 10/07/12 1030 10/08/12 0555  WBC 5.9 4.7 6.4 7.9 6.4  HGB 9.3* 8.5* 9.5* 9.7* 9.3*  HCT 26.1* 23.6* 25.9* 26.7* 25.8*  MCV 87.0 86.4 85.8 85.3 85.4  PLT 127* 121* 150 170 150   Cardiac Enzymes:  Recent Labs Lab 10/02/12 2310  TROPONINI <0.30   CBG:  Recent Labs Lab 10/07/12 0738 10/07/12 1141 10/07/12 1709 10/07/12 2032 10/08/12 0739  GLUCAP 94 148* 119* 171* 92    Signed:  Khiree Bukhari  [facility] 10/08/2012, 11:23 AM

## 2012-10-08 NOTE — Progress Notes (Signed)
Subjective:  Feels good. Reports good UOP, renal function not better but not worse either.   Objective Vital signs in last 24 hours: Filed Vitals:   10/07/12 1734 10/07/12 2034 10/08/12 0434 10/08/12 0909  BP: 138/55 131/57 150/57 140/53  Pulse: 66 75 69 72  Temp: 98.6 F (37 C) 97.8 F (36.6 C) 98 F (36.7 C) 97.5 F (36.4 C)  TempSrc: Oral Oral Oral   Resp: 18 18 18 17   Height:  5\' 1"  (1.549 m)    Weight:  59.101 kg (130 lb 4.7 oz)    SpO2: 96% 98% 97% 95%   Weight change: 0 kg (0 lb)  Intake/Output Summary (Last 24 hours) at 10/08/12 1102 Last data filed at 10/08/12 0909  Gross per 24 hour  Intake    480 ml  Output    650 ml  Net   -170 ml   Labs: Basic Metabolic Panel:  Recent Labs Lab 10/06/12 0752 10/07/12 0430 10/08/12 0555  NA 126* 125* 124*  K 3.5 3.4* 4.3  CL 88* 88* 88*  CO2 24 26 25   GLUCOSE 91 94 88  BUN 66* 66* 68*  CREATININE 5.96* 5.68* 5.64*  CALCIUM 8.8 8.3* 8.7   Liver Function Tests:  Recent Labs Lab 10/05/12 1045  AST 22  ALT 16  ALKPHOS 48  BILITOT 0.4  PROT 5.8*  ALBUMIN 3.2*   No results found for this basename: LIPASE, AMYLASE,  in the last 168 hours No results found for this basename: AMMONIA,  in the last 168 hours CBC:  Recent Labs Lab 10/04/12 1610 10/05/12 0625 10/06/12 0752 10/07/12 1030 10/08/12 0555  WBC 5.9 4.7 6.4 7.9 6.4  HGB 9.3* 8.5* 9.5* 9.7* 9.3*  HCT 26.1* 23.6* 25.9* 26.7* 25.8*  MCV 87.0 86.4 85.8 85.3 85.4  PLT 127* 121* 150 170 150   Cardiac Enzymes:  Recent Labs Lab 10/02/12 2310  TROPONINI <0.30   CBG:  Recent Labs Lab 10/07/12 0738 10/07/12 1141 10/07/12 1709 10/07/12 2032 10/08/12 0739  GLUCAP 94 148* 119* 171* 92    Iron Studies: No results found for this basename: IRON, TIBC, TRANSFERRIN, FERRITIN,  in the last 72 hours Studies/Results: No results found. Medications: Infusions:    Scheduled Medications: . amLODipine  5 mg Oral Daily  . insulin aspart  0-5 Units  Subcutaneous QHS  . insulin aspart  0-9 Units Subcutaneous TID WC  . levothyroxine  125 mcg Oral QAC breakfast  . LORazepam  4 mg Oral Once  . multivitamin  1 tablet Oral Daily  . pantoprazole  40 mg Oral Daily  . potassium citrate  20 mEq Oral BID  . sevelamer carbonate  4.8 g Oral TID WC  . sodium chloride  3 mL Intravenous Q12H  . zolpidem  5 mg Oral QHS    have reviewed scheduled and prn medications.  Physical Exam: General: alert, mobile in room Heart: RRR Lungs: clear Abdomen: soft, non tender Extremities: no edema Dialysis Access: right sided PC still in place    Assessment/ Plan: Pt is a 72 y.o. yo female who was admitted on 09/24/2012 with AKI  Assessment/Plan: 1. 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  No HD required since 7/5 and seems ? to be recovering, at least not HD dependent at this time.  Will liberalize fluid intake to help with nephrocalcinosis and have also added potassium citrate.  Trying to avoid further insults so will decrease norvasc to avoid hypotension.  She is OK for discharge, I will arrange labs for Friday and go from there, she also has an appt for folow up with me on July 31 at 10:45  I will keep PC in for another few days and D/C as OP.  2. Hyponatremia- inappropriate high urine osm but difficult to determine due to abnormal urine all around.  Also could be related to thyroid? I feel like I need to liberalize her fluid due to the nephrocalcinosis piece.   3. Anemia- improved and steady- thought to be due to kidney biopsy 4. HTN/volume- have decreased norvasc 5. Hematuria-not painful-  due to kidney biopsy vs nephrocalcinosis- has cleared  Pt to be discharged today, to follow with me closely   Donaciano Range A   10/08/2012,11:02 AM  LOS: 14 days

## 2012-10-22 ENCOUNTER — Other Ambulatory Visit (HOSPITAL_COMMUNITY): Payer: Self-pay | Admitting: Nephrology

## 2012-10-22 DIAGNOSIS — N186 End stage renal disease: Secondary | ICD-10-CM

## 2012-10-23 ENCOUNTER — Ambulatory Visit (HOSPITAL_COMMUNITY)
Admission: RE | Admit: 2012-10-23 | Discharge: 2012-10-23 | Disposition: A | Discharge status: Home / Self Care | Payer: Medicare Other | Source: Ambulatory Visit | Attending: Nephrology | Admitting: Nephrology

## 2012-10-23 ENCOUNTER — Telehealth (HOSPITAL_COMMUNITY): Payer: Self-pay | Admitting: *Deleted

## 2012-10-23 DIAGNOSIS — Z452 Encounter for adjustment and management of vascular access device: Secondary | ICD-10-CM | POA: Insufficient documentation

## 2012-10-23 DIAGNOSIS — N179 Acute kidney failure, unspecified: Secondary | ICD-10-CM | POA: Insufficient documentation

## 2012-10-23 DIAGNOSIS — N186 End stage renal disease: Secondary | ICD-10-CM

## 2012-10-23 MED ORDER — CHLORHEXIDINE GLUCONATE 4 % EX LIQD
CUTANEOUS | Status: AC
  Filled 2012-10-23: qty 45

## 2012-10-23 NOTE — Procedures (Signed)
Successful RT IJ HD CATH REMOVAL NO COMP STABLE FULL REPORT IN PACS

## 2012-10-23 NOTE — Progress Notes (Signed)
Tegaderm dressing to right chest HD catheter removal site has loosened, no longer occlusive therefore removed (small amount sanguinous on guaze but no further oozing. Site clean and dry, level 0. Guaze reapplied and tegaderm occlusive dressing placed over guaze. Patient discharged walking.

## 2012-10-30 ENCOUNTER — Other Ambulatory Visit: Payer: Self-pay

## 2012-11-18 ENCOUNTER — Other Ambulatory Visit: Payer: Self-pay | Admitting: Dermatology

## 2013-01-27 ENCOUNTER — Other Ambulatory Visit: Payer: Self-pay | Admitting: Dermatology

## 2013-01-30 ENCOUNTER — Other Ambulatory Visit: Payer: Self-pay

## 2013-05-16 ENCOUNTER — Telehealth: Payer: Self-pay | Admitting: *Deleted

## 2013-05-16 DIAGNOSIS — R1900 Intra-abdominal and pelvic swelling, mass and lump, unspecified site: Secondary | ICD-10-CM

## 2013-05-16 NOTE — Telephone Encounter (Signed)
Pt called states" I saw Dr. Alycia Rossetti at [facility] in Dec and she said I would have an [place] in 2-3 months in [place] and I haven't heard about any appts." Md's last office note 03/17/13 to to f/u in 2-3 moths with MD in [place] with [place] prior.  Call to scheduling [place] appt on  Tues  3/3 0930am arrive 0915 have a full bladder. Follow appt with Dr. Alycia Rossetti 3/12 at 10am. Informed pt, who verbalized understanding and confirmed appts. [place] order taken to [facility] in managed care for [employer].

## 2013-05-27 ENCOUNTER — Other Ambulatory Visit: Payer: Self-pay | Admitting: Gynecologic Oncology

## 2013-05-27 ENCOUNTER — Ambulatory Visit (HOSPITAL_COMMUNITY)
Admission: RE | Admit: 2013-05-27 | Discharge: 2013-05-27 | Disposition: A | Discharge status: Home / Self Care | Payer: Medicare Other | Source: Ambulatory Visit | Attending: Gynecologic Oncology | Admitting: Gynecologic Oncology

## 2013-05-27 DIAGNOSIS — N9489 Other specified conditions associated with female genital organs and menstrual cycle: Secondary | ICD-10-CM | POA: Insufficient documentation

## 2013-05-27 DIAGNOSIS — R1900 Intra-abdominal and pelvic swelling, mass and lump, unspecified site: Secondary | ICD-10-CM

## 2013-05-27 DIAGNOSIS — Z992 Dependence on renal dialysis: Secondary | ICD-10-CM | POA: Insufficient documentation

## 2013-05-27 DIAGNOSIS — R1909 Other intra-abdominal and pelvic swelling, mass and lump: Secondary | ICD-10-CM | POA: Insufficient documentation

## 2013-05-27 DIAGNOSIS — C50919 Malignant neoplasm of unspecified site of unspecified female breast: Secondary | ICD-10-CM | POA: Insufficient documentation

## 2013-05-27 DIAGNOSIS — Z90710 Acquired absence of both cervix and uterus: Secondary | ICD-10-CM | POA: Insufficient documentation

## 2013-06-04 ENCOUNTER — Encounter: Payer: Self-pay | Admitting: Podiatrist

## 2013-06-04 ENCOUNTER — Ambulatory Visit (INDEPENDENT_AMBULATORY_CARE_PROVIDER_SITE_OTHER): Payer: Medicare Other | Admitting: Podiatrist

## 2013-06-04 ENCOUNTER — Other Ambulatory Visit: Payer: Self-pay | Admitting: Oncology

## 2013-06-04 VITALS — BP 116/50 | HR 72 | Resp 16

## 2013-06-04 DIAGNOSIS — R609 Edema, unspecified: Secondary | ICD-10-CM

## 2013-06-04 DIAGNOSIS — Z9889 Other specified postprocedural states: Secondary | ICD-10-CM

## 2013-06-04 DIAGNOSIS — Q828 Other specified congenital malformations of skin: Secondary | ICD-10-CM

## 2013-06-04 DIAGNOSIS — M216X9 Other acquired deformities of unspecified foot: Secondary | ICD-10-CM

## 2013-06-04 DIAGNOSIS — Z853 Personal history of malignant neoplasm of breast: Secondary | ICD-10-CM

## 2013-06-04 DIAGNOSIS — M216X2 Other acquired deformities of left foot: Secondary | ICD-10-CM

## 2013-06-04 NOTE — Progress Notes (Signed)
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 Callouses    trim callus left plantar forefoot "I need this callus trimmed and the ankle has been swelling some"     HPI: Patient is 73 y.o. female who presents today for followup of calloused lesions plantar aspect of the left foot. She also states that she has had some ankle swelling recently and she's had recently been diagnosed with kidney failure and is now on peritoneal dialysis. Her kidney failure came about quite suddenly     Physical Exam GENERAL APPEARANCE: Alert, conversant. Appropriately groomed. No acute distress.  VASCULAR: Pedal pulses palpable at 2/4 DP and PT bilateral.  Capillary refill time is immediate to all digits,   NEUROLOGIC: sensation is intact epicritically and protectively to 5.07 monofilament at 5/5 sites bilateral.  Light touch is intact bilateral, vibratory sensation intact bilateral,  MUSCULOSKELETAL: acceptable muscle strength, tone and stability bilateral.  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 She also has ankle swelling and foot swelling on the left foot which is new and most likely related to her dialysis. Mild hyperkeratotic lesion also present right hallux medially  Assessment: Porokeratotic lesion left foot, prominent metatarsals  Plan: Excise the deeply enucleated lesions with a 15 blade without complication. Added salinocaine and an aperture pad. She will be seen back on as-needed basis for followup and if any problems or concerns arise she is instructed to call.

## 2013-06-05 ENCOUNTER — Ambulatory Visit: Payer: Medicare Other | Attending: Gynecologic Oncology | Admitting: Gynecologic Oncology

## 2013-06-05 ENCOUNTER — Ambulatory Visit (HOSPITAL_BASED_OUTPATIENT_CLINIC_OR_DEPARTMENT_OTHER): Payer: Medicare Other

## 2013-06-05 ENCOUNTER — Encounter: Payer: Self-pay | Admitting: Gynecologic Oncology

## 2013-06-05 VITALS — BP 125/53 | HR 72 | Temp 97.5°F | Resp 16 | Ht 61.0 in | Wt 119.4 lb

## 2013-06-05 DIAGNOSIS — Z87891 Personal history of nicotine dependence: Secondary | ICD-10-CM | POA: Insufficient documentation

## 2013-06-05 DIAGNOSIS — Z880 Allergy status to penicillin: Secondary | ICD-10-CM | POA: Insufficient documentation

## 2013-06-05 DIAGNOSIS — Z992 Dependence on renal dialysis: Secondary | ICD-10-CM | POA: Insufficient documentation

## 2013-06-05 DIAGNOSIS — Z833 Family history of diabetes mellitus: Secondary | ICD-10-CM | POA: Insufficient documentation

## 2013-06-05 DIAGNOSIS — D0590 Unspecified type of carcinoma in situ of unspecified breast: Secondary | ICD-10-CM

## 2013-06-05 DIAGNOSIS — I120 Hypertensive chronic kidney disease with stage 5 chronic kidney disease or end stage renal disease: Secondary | ICD-10-CM | POA: Insufficient documentation

## 2013-06-05 DIAGNOSIS — K219 Gastro-esophageal reflux disease without esophagitis: Secondary | ICD-10-CM | POA: Insufficient documentation

## 2013-06-05 DIAGNOSIS — R1900 Intra-abdominal and pelvic swelling, mass and lump, unspecified site: Secondary | ICD-10-CM

## 2013-06-05 DIAGNOSIS — Z79899 Other long term (current) drug therapy: Secondary | ICD-10-CM | POA: Insufficient documentation

## 2013-06-05 DIAGNOSIS — E119 Type 2 diabetes mellitus without complications: Secondary | ICD-10-CM | POA: Insufficient documentation

## 2013-06-05 DIAGNOSIS — N186 End stage renal disease: Secondary | ICD-10-CM | POA: Insufficient documentation

## 2013-06-05 DIAGNOSIS — E039 Hypothyroidism, unspecified: Secondary | ICD-10-CM | POA: Insufficient documentation

## 2013-06-05 LAB — BUN AND CREATININE (CC13)
BUN: 41.1 mg/dL — ABNORMAL HIGH (ref 7.0–26.0)
Creatinine: 5.6 mg/dL (ref 0.6–1.1)

## 2013-06-05 NOTE — Progress Notes (Signed)
Patient originally scheduled for an MRI of the pelvis with and without contrast per Dr. Alycia Rossetti.  BUN and Creat obtained at the [facility] at the end of her visit per radiology guidelines.  Pt is on peritoneal dialysis.  BUN 41.1 and Creat 5.6- Dr. Alycia Rossetti aware.  Spoke with [facility] Latino in [place].  Per Dr. Jobe Igo, radiologist, the patient will be scheduled for an MRI without contrast because giving contrast would be "too much of a risk."  Ordered adjusted per MRI.

## 2013-06-05 NOTE — Addendum Note (Signed)
Addended by: Joylene John D on: 06/05/2013 10:07 AM   Modules accepted: Orders

## 2013-06-05 NOTE — Patient Instructions (Signed)
I will call you with the results of your MRI.

## 2013-06-05 NOTE — Progress Notes (Signed)
Consult Note: Gyn-Onc  Charlann Lange 73 y.o. female  CC:  Chief Complaint  Patient presents with  . Pelvic Mass    Follow up    HPI: Patient is a 73 year old who we saw in December at the request of Dr. Ronita Hipps for evaluation of bilateral adnexal masses. The patient is status post hysterectomy. She has a family history of ovarian cancer. I had previously seen her here for discussion regarding bilateral salpingo-oophorectomy genetic testing and she declined. She herself has a personal history of breast cancer 2010. She was treated with lumpectomy for DCIS. [name] had end-stage renal disease and has been fairly unexplained and has been on peritoneal dialysis 4 times a day since September 2014. Most recently 2 weeks ago she has a peritoneal dialysis machine at home and when tested in the evening.  She had undergone ultrasound after reporting lower abdominal discomfort. She states that the pain was a 10 out of 10 that she needed to take pain medication. Should ultrasound performed that revealed the left adnexa to be 5.4 x 1.9 x 2.4 cm in the right adnexa 5.5 x 2.6 cm. There is free fluid and some features that were complex. After discussion the patient opted to have close followup. She had a repeat ultrasound performed on March 3. It reveals that the right ovary was not visualized. There was a complex fluid collection seen in the right adnexa containing internal septations. The collection measured 6.7 x 2.2 x 4.4 cm. The catheter was also seen within his fluid collection likely representing a peritoneal dialysis catheter. The left ovary was not visualized. The small amount of free fluid seen in the left adnexa no definitive cystic or solid mass is visualized. The radiologic impression was that this could represent a peritoneal inclusion cyst however a cystic neoplasm could not be excluded and they suggested pelvic MRI for further evaluation.  Interval History:  She states that she does not feel that she  is doing very well. She has had problems since she's been using her peritoneal dialysis machine at night. She states that she is waking up in the limit with abdominal crampy pain and has loose stools. When she passes gas or has a bowel movement the pain goes away. She did have a "virtual" colonoscopy 2-1/2 years ago. She states that she cannot undergo regular colonoscopy as her colon was "twisted" at the time of her hysterectomy. She denies any significant change in her bowels. And again she states that the pain completely resolved after she has a bowel movement. She is complaining of retaining fluid in increase her dialysis last night. She does make a small amount of urine. She denies any chest pain or shortness of breath. She states she has some occasional nausea. She feels that her ankles are swollen and that she is swelling on her mons. She states her weight is stable.  Review of Systems  Constitutional: Not feeling well "at all". Denies fever. Skin: No rash Cardiovascular: No chest pain, shortness of breath, +edema  Pulmonary: No cough Gastro Intestinal: Reporting intermittent lower abdominal soreness.  + nausea, vomiting, constipation, +diarrhea reported. No bright red blood per rectum. Genitourinary: Oliguric Musculoskeletal: No myalgia, arthralgia, joint swelling or pain.  Neurologic: No weakness, numbness, or change in gait.  Psychology: No changes, worried   Current Meds:  Outpatient Encounter Prescriptions as of 06/05/2013  Medication Sig  . amLODipine (NORVASC) 5 MG tablet Take 1 tablet (5 mg total) by mouth daily.  Marland Kitchen b complex vitamins  tablet Take 1 tablet by mouth daily.  . Glucosamine-Chondroit-Vit C-Mn (GLUCOSAMINE 1500 COMPLEX) CAPS Take by mouth 3 (three) times daily.  Marland Kitchen KRILL OIL 1000 MG CAPS Take one tablet daily  . levothyroxine (SYNTHROID, LEVOTHROID) 125 MCG tablet Take 1 tablet (125 mcg total) by mouth daily.  . Multiple Vitamin (MULTIVITAMIN) tablet Take 1 tablet by  mouth daily.  Marland Kitchen omeprazole (PRILOSEC) 20 MG capsule Take 20 mg by mouth daily.  . Probiotic Product (PROBIOTIC DAILY) CAPS Take one capsule every other day  . temazepam (RESTORIL) 15 MG capsule   . co-enzyme Q-10 30 MG capsule Take 300 mg by mouth daily.  . diphenhydrAMINE (BENADRYL) 25 MG tablet Take 25 mg by mouth at bedtime as needed.  . potassium citrate (UROCIT-K) 10 MEQ (1080 MG) SR tablet Take 2 tablets (20 mEq total) by mouth 2 (two) times daily.  . promethazine (PHENERGAN) 25 MG tablet   . sevelamer carbonate (RENVELA) 2.4 G PACK Take 4.8 g by mouth 3 (three) times daily with meals.  . tamsulosin (FLOMAX) 0.4 MG CAPS Take 1 capsule (0.4 mg total) by mouth daily.    Allergy:  Allergies  Allergen Reactions  . Detrol [Tolterodine]     Leg cramps  . Penicillins Swelling    Social Hx:   History   Social History  . Marital Status: Widowed    Spouse Name: N/A    Number of Children: N/A  . Years of Education: N/A   Occupational History  . Not on file.   Social History Main Topics  . Smoking status: Former Smoker    Quit date: 09/25/1983  . Smokeless tobacco: Never Used  . 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 Drug Use: No  . Sexual Activity: No   Other Topics Concern  . Not on file   Social History Narrative  . No narrative on file    Past Surgical Hx:  Past Surgical History  Procedure Laterality Date  . Thyroidectomy    . Ductal carcinoma      right breast   . Abdominoplasty    . Tummy tuck    . Abdominal hysterectomy    . Appendectomy    . Cosmetic surgery    . Insertion of dialysis catheter Right 09/25/2012    Procedure: INSERTION OF DIALYSIS CATHETER;  Surgeon: Mal Misty, MD;  Location: [facility];  Service: Vascular;  Laterality: Right;  Righ Internal Jugular Placement    Past Medical Hx:  Past Medical History  Diagnosis Date  . Hypothyroidism   . GERD  (gastroesophageal reflux disease)   . HTN (hypertension)   . Diabetes mellitus   . Arthritis   . Anemia     Oncology Hx:   No history exists.    Family Hx:  Family History  Problem Relation Age of Onset  . Thyroid cancer Daughter 72  . Diabetes Father   . Diabetes Brother     Vitals:  Blood pressure 125/53, pulse 72, temperature 97.5 F (36.4 C), temperature source Oral, resp. rate 16, height 5\' 1"  (1.549 m), weight 119 lb 6.4 oz (54.159 kg).  Physical Exam: Thin female in no acute distress.  Abdomen: Peritoneal dialysis catheter in the left lower quadrant. She is tender surrounding the catheter. She does have a positive fluid wave. She has some generalized edema overlying her mons. There is no lymphadenopathy.  Extremities: 1+ pitting  edema equal bilaterally primarily around the ankles.  Pelvic: External genitalia appropriate for age. Bimanual examination reveals no masses nodularity or tenderness within the pelvis. She is tender in the left lower quadrant around her peritoneal dialysis catheter.  Assessment/Plan: 73 year old with history of peritoneal dialysis who appears to have developed pelvic cysts after her peritoneal dialysis was to. Ultrasound from prior to her dialysis that showed no cyst. In December she had bilateral cyst measuring approximately 5 cm and now appears to be a cyst on the right side only near her peritoneal dialysis catheter that measures approximately 7 cm. I do not believe the patient's pelvic pain is related to the cyst. Her symptoms of pain accordingly when she is about to have a bowel movement or passing gas. Once that happens, her symptoms completely resolve. I explained to her that the cyst is persistent and would not only cause intermittent pain when she has to have a bowel movement. [name] also complaining of being bloated and feeling fluid in her abdomen. She does have a palpable fluid wave today, but I explained to her that this is because she does  have her dialysis every evening. After discussion she would like to proceed with an MRI. That is been ordered. We'll contact her with the results once they're available. We will determine her followup pending the MRI results.   Hatice Bubel A., MD 06/05/2013, 9:54 AM

## 2013-06-13 ENCOUNTER — Telehealth: Payer: Self-pay | Admitting: Gynecologic Oncology

## 2013-06-13 ENCOUNTER — Other Ambulatory Visit: Payer: Self-pay | Admitting: Family Medicine

## 2013-06-13 DIAGNOSIS — I739 Peripheral vascular disease, unspecified: Principal | ICD-10-CM

## 2013-06-13 DIAGNOSIS — I779 Disorder of arteries and arterioles, unspecified: Secondary | ICD-10-CM

## 2013-06-13 NOTE — Telephone Encounter (Signed)
Patient wanting to schedule her MRI at [facility] and cancel at [facility] due to cost.  Appt made for March 29 at 12:30.  Order faxed to [phone].  Patient advised to call for any questions or concerns.

## 2013-06-16 ENCOUNTER — Ambulatory Visit (HOSPITAL_COMMUNITY): Admission: RE | Admit: 2013-06-16 | Payer: Medicare Other | Source: Ambulatory Visit

## 2013-06-17 ENCOUNTER — Ambulatory Visit
Admission: RE | Admit: 2013-06-17 | Discharge: 2013-06-17 | Disposition: A | Discharge status: Home / Self Care | Payer: Medicare Other | Source: Ambulatory Visit | Attending: Family Medicine | Admitting: Family Medicine

## 2013-06-17 DIAGNOSIS — I739 Peripheral vascular disease, unspecified: Principal | ICD-10-CM

## 2013-06-17 DIAGNOSIS — I779 Disorder of arteries and arterioles, unspecified: Secondary | ICD-10-CM

## 2013-06-19 ENCOUNTER — Other Ambulatory Visit: Payer: Self-pay | Admitting: Obstetrics and Gynecology

## 2013-06-19 DIAGNOSIS — M8580 Other specified disorders of bone density and structure, unspecified site: Secondary | ICD-10-CM

## 2013-06-22 ENCOUNTER — Ambulatory Visit
Admission: RE | Admit: 2013-06-22 | Discharge: 2013-06-22 | Disposition: A | Discharge status: Home / Self Care | Payer: Medicare Other | Source: Ambulatory Visit | Attending: Gynecologic Oncology | Admitting: Gynecologic Oncology

## 2013-06-22 DIAGNOSIS — R1900 Intra-abdominal and pelvic swelling, mass and lump, unspecified site: Secondary | ICD-10-CM

## 2013-06-24 ENCOUNTER — Telehealth: Payer: Self-pay | Admitting: Gynecologic Oncology

## 2013-06-24 NOTE — Telephone Encounter (Signed)
TC to patient. Described MRI findings with regards to the fluid collection, lack of distinct ovarian mass and distal colonic thickening. She states that she has been to a gastroenterologist and that "there is nothing wrong". She will return to see me prn. She requests a copy of her scan and this will be sent to her via echart. PG

## 2013-07-10 ENCOUNTER — Ambulatory Visit: Payer: Medicare Other | Admitting: Podiatrist

## 2013-07-21 ENCOUNTER — Other Ambulatory Visit: Payer: Self-pay | Admitting: Dermatology

## 2013-07-24 ENCOUNTER — Ambulatory Visit (INDEPENDENT_AMBULATORY_CARE_PROVIDER_SITE_OTHER): Payer: Medicare Other | Admitting: Podiatrist

## 2013-07-24 ENCOUNTER — Encounter: Payer: Self-pay | Admitting: Podiatrist

## 2013-07-24 VITALS — BP 115/60 | HR 74 | Resp 16

## 2013-07-24 DIAGNOSIS — M216X9 Other acquired deformities of unspecified foot: Secondary | ICD-10-CM

## 2013-07-24 DIAGNOSIS — M216X2 Other acquired deformities of left foot: Secondary | ICD-10-CM

## 2013-07-24 DIAGNOSIS — Q828 Other specified congenital malformations of skin: Secondary | ICD-10-CM

## 2013-07-24 DIAGNOSIS — E119 Type 2 diabetes mellitus without complications: Secondary | ICD-10-CM

## 2013-07-24 NOTE — Progress Notes (Signed)
   HPI: Patient is 73 y.o. female who presents today for followup of calloused lesions plantar aspect of the left foot- submetatarsal 2.   She continues to be on peritoneal dialysis. Her kidney failure came about quite suddenly   Physical Exam  GENERAL APPEARANCE: Alert, conversant. Appropriately groomed. No acute distress.  VASCULAR: Pedal pulses palpable at 2/4 DP and PT bilateral. Capillary refill time is immediate to all digits,  NEUROLOGIC: sensation is intact epicritically and protectively to 5.07 monofilament at 5/5 sites bilateral. Light touch is intact bilateral, vibratory sensation intact bilateral,  MUSCULOSKELETAL: acceptable muscle strength, tone and stability bilateral. 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   Assessment: Porokeratotic lesion left foot, prominent metatarsal left  Plan: Excise the deeply enucleated lesions with a 15 blade without complication. Added salinocaine and an donut aperture pad. She will be seen back on as-needed basis for followup and if any problems or concerns arise she is instructed to call. If the lesion returns we discussed orthotics with accomidation.

## 2013-07-29 ENCOUNTER — Encounter: Payer: Self-pay | Admitting: Gynecologic Oncology

## 2013-08-21 ENCOUNTER — Other Ambulatory Visit: Payer: Self-pay | Admitting: *Deleted

## 2013-08-22 ENCOUNTER — Telehealth: Payer: Self-pay | Admitting: Oncology

## 2013-08-22 NOTE — Telephone Encounter (Signed)
s.w. pt and advised on June appts...pt ok and aware

## 2013-08-28 ENCOUNTER — Telehealth: Payer: Self-pay | Admitting: *Deleted

## 2013-08-28 ENCOUNTER — Other Ambulatory Visit (HOSPITAL_BASED_OUTPATIENT_CLINIC_OR_DEPARTMENT_OTHER): Payer: Medicare Other

## 2013-08-28 DIAGNOSIS — Z853 Personal history of malignant neoplasm of breast: Secondary | ICD-10-CM

## 2013-08-28 LAB — COMPREHENSIVE METABOLIC PANEL (CC13)
ALT: 17 U/L (ref 0–55)
ANION GAP: 20 meq/L — AB (ref 3–11)
AST: 15 U/L (ref 5–34)
Albumin: 2.6 g/dL — ABNORMAL LOW (ref 3.5–5.0)
Alkaline Phosphatase: 62 U/L (ref 40–150)
BUN: 75.7 mg/dL — ABNORMAL HIGH (ref 7.0–26.0)
CALCIUM: 7.5 mg/dL — AB (ref 8.4–10.4)
CHLORIDE: 104 meq/L (ref 98–109)
CO2: 16 meq/L — AB (ref 22–29)
CREATININE: 5.9 mg/dL — AB (ref 0.6–1.1)
Glucose: 110 mg/dl (ref 70–140)
Potassium: 3 mEq/L — CL (ref 3.5–5.1)
Sodium: 140 mEq/L (ref 136–145)
TOTAL PROTEIN: 5.7 g/dL — AB (ref 6.4–8.3)
Total Bilirubin: 0.38 mg/dL (ref 0.20–1.20)

## 2013-08-28 LAB — CBC WITH DIFFERENTIAL/PLATELET
BASO%: 0.8 % (ref 0.0–2.0)
BASOS ABS: 0.1 10*3/uL (ref 0.0–0.1)
EOS ABS: 0.5 10*3/uL (ref 0.0–0.5)
EOS%: 6.6 % (ref 0.0–7.0)
HEMATOCRIT: 29.6 % — AB (ref 34.8–46.6)
HEMOGLOBIN: 9.7 g/dL — AB (ref 11.6–15.9)
LYMPH%: 14.9 % (ref 14.0–49.7)
MCH: 33 pg (ref 25.1–34.0)
MCHC: 32.7 g/dL (ref 31.5–36.0)
MCV: 100.7 fL (ref 79.5–101.0)
MONO#: 0.6 10*3/uL (ref 0.1–0.9)
MONO%: 7.6 % (ref 0.0–14.0)
NEUT%: 70.1 % (ref 38.4–76.8)
NEUTROS ABS: 5.3 10*3/uL (ref 1.5–6.5)
PLATELETS: 207 10*3/uL (ref 145–400)
RBC: 2.94 10*6/uL — ABNORMAL LOW (ref 3.70–5.45)
RDW: 12.4 % (ref 11.2–14.5)
WBC: 7.6 10*3/uL (ref 3.9–10.3)
lymph#: 1.1 10*3/uL (ref 0.9–3.3)

## 2013-08-28 NOTE — Telephone Encounter (Signed)
This RN called pt per noted k+ level per lab draw today for yearly follow up with Dr Jannifer Rodney for history of breast cancer.  Per discussion with pt she states " yes, I have been having problems with my potassium but I am trying not to take pills but am trying to get it up from my diet "  Note pt is doing peritoneal dialysis for known end stage renal disease.  Per phone call she states she is under the care of Dr Morton Stall at [facility] " and I had labs drawn on Tuesday this week and I'm sure they must have the same results "  This RN informed pt lab from today will be sent to Dr Morton Stall for review and recommendation. Pt asked for copy to be mailed to her.

## 2013-08-29 ENCOUNTER — Other Ambulatory Visit: Payer: Self-pay | Admitting: Oncology

## 2013-09-01 ENCOUNTER — Telehealth: Payer: Self-pay | Admitting: *Deleted

## 2013-09-01 ENCOUNTER — Ambulatory Visit
Admission: RE | Admit: 2013-09-01 | Discharge: 2013-09-01 | Disposition: A | Discharge status: Home / Self Care | Payer: Medicare Other | Source: Ambulatory Visit | Attending: Oncology | Admitting: Oncology

## 2013-09-01 ENCOUNTER — Ambulatory Visit
Admission: RE | Admit: 2013-09-01 | Discharge: 2013-09-01 | Disposition: A | Discharge status: Home / Self Care | Payer: Medicare Other | Source: Ambulatory Visit | Attending: Obstetrics and Gynecology | Admitting: Obstetrics and Gynecology

## 2013-09-01 DIAGNOSIS — Z9889 Other specified postprocedural states: Secondary | ICD-10-CM

## 2013-09-01 DIAGNOSIS — M8580 Other specified disorders of bone density and structure, unspecified site: Secondary | ICD-10-CM

## 2013-09-01 DIAGNOSIS — Z853 Personal history of malignant neoplasm of breast: Secondary | ICD-10-CM

## 2013-09-01 NOTE — Telephone Encounter (Signed)
Rec'd call from [facility] with [place], MRI with contrast cancelled due to patient elevated kidney function/GFR. MRI of breast must have contrast, will not be done without it. Patient will have regular mammo done and based on those results we will determine if any other testing needs to replace the MRI.

## 2013-09-02 ENCOUNTER — Inpatient Hospital Stay: Admission: RE | Admit: 2013-09-02 | Payer: Medicare Other | Source: Ambulatory Visit

## 2013-09-02 ENCOUNTER — Other Ambulatory Visit: Payer: Medicare Other

## 2013-09-04 ENCOUNTER — Ambulatory Visit (HOSPITAL_BASED_OUTPATIENT_CLINIC_OR_DEPARTMENT_OTHER): Payer: Medicare Other | Admitting: Oncology

## 2013-09-04 VITALS — BP 130/68 | HR 75 | Temp 97.8°F | Resp 18 | Ht 61.0 in | Wt 123.5 lb

## 2013-09-04 DIAGNOSIS — Z87898 Personal history of other specified conditions: Secondary | ICD-10-CM

## 2013-09-04 DIAGNOSIS — C50919 Malignant neoplasm of unspecified site of unspecified female breast: Secondary | ICD-10-CM

## 2013-09-04 NOTE — Progress Notes (Signed)
ID: Anita Pollard   DOB: 04/11/1940  MR#: [id number]  KFE#:[id number]  PCP:   Melinda Crutch, MD GYN: Nancy Marus MD SU:  Erroll Luna, MD Other:  Corliss Parish, MD, Shea Stakes MD   HISTORY OF PRESENT ILLNESS: From the original intake note:  Anita Pollard had some calcifications noted in 01/2008, and was recalled for a 71-monthright mammogram for close followup 07/28/2008.  This showed a 5 mm cluster of microcalcifications in the medial right breast, which have increased in number and varied in size and shape.  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  There were scattered foci of free floating carcinoma cells, but no definite evidence of stromal invasion.  The cells were 100% estrogen and 89% progesterone receptor positive.  With this information, the patient was referred to Dr. CBrantley Stage and bilateral breast MRIs were obtained 05/17.  They showed post biopsy changes on the right, but no additional areas of abnormal enhancement in either breast, the internal mammary area, or either axilla.  Accordingly after appropriate discussion on 08/26/2008, Dr. CBrantley Stageproceeded to needle localization lumpectomy with sentinel lymph node sampling, and the final pathology there (([id number] showed no residual ductal carcinoma in situ.  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  All four of the sentinel lymph nodes were negative.  The total tumor size therefore had to be estimated from the prior core biopsy, and it was 4 mm.  The tumor was felt to be high grade, and margins of the lumpectomy appeared negative.    The patient's subsequent history is as detailed below.  INTERVAL HISTORY: EPecoliareturns today for followup of her right breast cancer. Anita Pollard had a rough year since her last visit here. She had 2 episodes of urosepsis. She says she was "near death" and 2  other locations and she feels the nephrology group year was not on their toes and she could have died. [facility] and she is very satisfied with the care she is receiving there. She is on peritoneal dialysis. She is waiting for a kidney transplant.  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 That wound is healing well. She is concerned about a fluid collection near her peritoneal dialysis catheter which was diagnosed by ultrasonography through [facility] She gets diarrhea from her peritoneal dialysis. [name] also worried about osteopenia problems. Her appetite is poor. She has occasional problems with nausea and vomiting. Her sense of taste is "gone". She has problems with insomnia. A detailed review of systems today was otherwise stable  PAST MEDICAL HISTORY: Past Medical History  Diagnosis Date  . Hypothyroidism   . GERD (gastroesophageal reflux disease)   . HTN (hypertension)   . Diabetes mellitus   . Arthritis   . Anemia     PAST SURGICAL HISTORY: Past Surgical History  Procedure Laterality Date  . Thyroidectomy    . Ductal carcinoma      right breast   . Abdominoplasty    . Tummy tuck    . Abdominal hysterectomy    . Appendectomy    . Cosmetic surgery    . Insertion of dialysis catheter Right 09/25/2012    Procedure: INSERTION OF DIALYSIS CATHETER;  Surgeon: JMal Misty MD;  [place] [facility]  Service: Vascular;  Laterality: Right;  Righ Internal Jugular Placement  FAMILY HISTORY Family History  Problem Relation Age of Onset  . Thyroid cancer Daughter 71  . Diabetes Father   . Diabetes Brother   The patient's father died in an automobile accident at age 15.  The patient's mother died at age 102 from unclear causes.  The patient has 6 siblings.  There is no breast or ovarian cancer in the immediate family.  One sister did have multiple myeloma.  However, the patient has "double first  cousins" (the patient's mother's sister married the patient's father's brother), and one of them recently died from ovarian cancer at the age of 48.  GYNECOLOGIC HISTORY: The patient is GX, P2.  First pregnancy to term at age 9.  She did not nurse.  She took hormone replacement for approximately 10 years, and stopped about 2001.  She had no complications from this.  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  She has been retired 15 years.  Her husband was an Chief Financial Officer.  He died from complications of chronic lymphoid leukemia at the age of 77.  Her son, Anita Pollard, is 70.  Lives in [place], and is in sales.  Her daughter, Anita Pollard, lives in [place], and she is a homemaker with 6 children.  The patient has a total of 10 grandchildren.  She is a Tourist information centre manager.   ADVANCED DIRECTIVES:  HEALTH MAINTENANCE: History  Substance Use Topics  . Smoking status: Former Smoker    Quit date: 09/25/1983  . Smokeless tobacco: Never Used  . 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 Detrol [Tolterodine]     Leg cramps  . Penicillins Swelling    Current Outpatient Prescriptions  Medication Sig Dispense Refill  . b complex vitamins tablet Take 1 tablet by mouth daily.      Marland Kitchen co-enzyme Q-10 30 MG capsule Take 300 mg by mouth daily.      . diphenhydrAMINE (BENADRYL) 25 MG tablet Take 25 mg by mouth at bedtime as needed.      . Glucosamine-Chondroit-Vit C-Mn (GLUCOSAMINE 1500 COMPLEX) CAPS Take by mouth 3 (three) times daily.      Marland Kitchen KRILL OIL 1000 MG CAPS Take one tablet daily      . levothyroxine (SYNTHROID, LEVOTHROID) 125 MCG tablet Take 1 tablet (125 mcg total) by mouth daily.  30 tablet  1  . Multiple Vitamin (MULTIVITAMIN) tablet Take 1 tablet by mouth daily.      Marland Kitchen omeprazole (PRILOSEC) 20 MG  capsule Take 20 mg by mouth daily.      . potassium citrate (UROCIT-K) 10 MEQ (1080 MG) SR tablet Take 2 tablets (20 mEq total) by mouth 2 (two) times daily.  60 tablet  0  . Probiotic Product (PROBIOTIC DAILY) CAPS Take one capsule every other day      . promethazine (PHENERGAN) 25 MG tablet       . sevelamer carbonate (RENVELA) 2.4 G PACK Take 4.8 g by mouth 3 (three) times daily with meals.  120 each  0  . tamsulosin (FLOMAX) 0.4 MG CAPS Take 1 capsule (0.4 mg total) by mouth daily.  30 capsule  0  . temazepam (RESTORIL) 15 MG capsule        No current facility-administered medications for this visit.    OBJECTIVE:  Anita Pollard who appears stated age 73 Vitals:   09/04/13 1319  BP: 130/68  Pulse: 75  Temp: 97.8 F (36.6 C)  Resp: 18     Body mass index is 23.35 kg/(m^2).    ECOG FS: 1 Filed Weights   09/04/13 1319  Weight: 123 lb 8 oz (56.019 kg)   Sclerae unicteric, pupils round and equal Oropharynx clear and moist No cervical or supraclavicular adenopathy Lungs clear to auscultation, no rales or rhonchi Heart regular rate and rhythm, no murmur appreciated Abdomen soft, nontender, positive bowel sounds, PD catheter in place MSK no focal spinal tenderness Neuro: nonfocal, well oriented with slightly anxious affect  Breasts:  The right breast is status post lumpectomy. There is no evidence of local recurrence. The right axilla is benign. The left breast is unremarkable.      LAB RESULTS: Lab Results  Component Value Date   WBC 7.6 08/28/2013   NEUTROABS 5.3 08/28/2013   HGB 9.7* 08/28/2013   HCT 29.6* 08/28/2013   MCV 100.7 08/28/2013   PLT 207 08/28/2013      Chemistry      Component Value Date/Time   NA 140 08/28/2013 0955   NA 124* 10/08/2012 0555   K 3.0* 08/28/2013 0955   K 4.3 10/08/2012 0555   CL 88* 10/08/2012 0555   CL 111* 09/03/2012 1249   CO2 16* 08/28/2013 0955   CO2 25 10/08/2012 0555   BUN 75.7* 08/28/2013 0955   BUN 68* 10/08/2012 0555   CREATININE 5.9*  08/28/2013 0955   CREATININE 5.64* 10/08/2012 0555      Component Value Date/Time   CALCIUM 7.5* 08/28/2013 0955   CALCIUM 8.7 10/08/2012 0555   ALKPHOS 62 08/28/2013 0955   ALKPHOS 48 10/05/2012 1045   AST 15 08/28/2013 0955   AST 22 10/05/2012 1045   ALT 17 08/28/2013 0955   ALT 16 10/05/2012 1045   BILITOT 0.38 08/28/2013 0955   BILITOT 0.4 10/05/2012 1045        STUDIES: Dg Bone Density  09/01/2013   CLINICAL DATA:  73 year old postmenopausal white female with history of hysterectomy at age 63, renal failure, hypothyroidism, currently taking omeprazole. Patient takes vitamin-D supplementation. No history of bone building therapy. Follow-up exam.  EXAM: DUAL X-RAY ABSORPTIOMETRY (DXA) FOR BONE MINERAL DENSITY  FINDINGS: RIGHT  FOREARM (1/3 RADIUS)  Bone Mineral Density (BMD):  0.580  Young Adult T Score:  -1.9  Z Score:  0.7  LEFT FEMUR NECK  Bone Mineral Density (BMD):  0.626 g/cm2  Young Adult T-Score: -2.0  Z-Score:  0.1  ASSESSMENT: Patient's diagnostic category is LOW BONE MASS by WHO Criteria.  FRACTURE RISK: INCREASED  FRAX: Based on the Hohenwald model, the 10 year probability of a major osteoporotic fracture is 18%. The 10 year probability of a hip fracture is 4.4%.  COMPARISON: Comparison is made to 08/25/2010; since the prior exam there has been a significant 9.4% decrease in bone mineral density of the left hip and no significant interval change in bone mineral density of the right wrist.  Effective therapies are available in the form of bisphosphonates, selective estrogen receptor modulators, biologic agents, and hormone replacement therapy (for women). All patients should ensure an adequate intake of dietary calcium (1200 mg daily) and vitamin D (800 IU daily) unless contraindicated.  All treatment decisions require clinical judgment and consideration of individual patient factors, including patient preferences, co-morbidities, previous drug use, risk factors not captured in  the [facility]  model (e.g., frailty, falls, vitamin D deficiency, increased bone turnover, interval significant decline in bone density) and possible under- or over-estimation of fracture risk by FRAX.  The National Osteoporosis Foundation recommends that FDA-approved medical therapies be considered in postmenopausal women and men age 38 or older with a:  1. Hip or vertebral (clinical or morphometric) fracture.  2. T-score of -2.5 or lower at the spine or hip.  3. Ten-year fracture probability by FRAX of 3% or greater for hip fracture or 20% or greater for major osteoporotic fracture.  People with diagnosed cases of osteoporosis or at high risk for fracture should have regular bone mineral density tests. For patients eligible for Medicare, routine testing is allowed once every 2 years. The testing frequency can be increased to one year for patients who have rapidly progressing disease, those who are receiving or discontinuing medical therapy to restore bone mass, or have additional risk factors.  [facility]) Criteria:  Normal: T-scores from +1.0 to -1.0  Low Bone Mass (Osteopenia): T-scores between -1.0 and -2.5  Osteoporosis: T-scores -2.5 and below  Comparison to Reference Population:  T-score is the key measure used in the diagnosis of osteoporosis and relative risk determination for fracture. It provides a value for bone mass relative to the mean bone mass of a young adult reference population expressed in terms of standard deviation (SD).  Z-score is the age-matched score showing the patient's values compared to a population matched for age, sex, and race. This is also expressed in terms of standard deviation. The patient may have values that compare favorably to the age-matched values and still be at increased risk for fracture.   Electronically Signed   By: Everlean Alstrom M.D.   On: 09/01/2013 17:07   Mm Digital Diagnostic Bilat  09/01/2013   CLINICAL DATA:  73 year old female with history  of right breast cancer post lumpectomy August 26, 2008.  EXAM: DIGITAL DIAGNOSTIC  BILATERAL MAMMOGRAM WITH CAD  COMPARISON:  Previous exams.  ACR Breast Density Category c: The breast tissue is heterogeneously dense, which may obscure small masses.  FINDINGS: No suspicious masses or calcifications are seen in either breast. Postlumpectomy changes are present in the right breast. A spot compression magnification tangential view of the lumpectomy site in the right breast was performed. There is no mammographic evidence of locally recurrent malignancy.  Mammographic images were processed with CAD.  IMPRESSION: No mammographic evidence of malignancy in either breast.  RECOMMENDATION: Diagnostic mammogram is suggested in 1 year. (Code:DM-B-01Y)  I have discussed the findings and recommendations with the patient. Results were also provided in writing at the conclusion of the visit. If applicable, a reminder letter will be sent to the patient regarding the next appointment.  BI-RADS CATEGORY  2: Benign.   Electronically Signed   By: Everlean Alstrom M.D.   On: 09/01/2013 14:44     ASSESSMENT: 73 y.o. 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    (2)  Status post right lumpectomy in June 2010, showing no residual tumor and 0 of 4 sentinel lymph nodes involved.    (3)  Refused adjuvant therapy, now followed with observation alone and no clinical evidence of recurrence.   (4)  Elevated serum creatinine, recent onset   PLAN:  As far as her breast cancer is concerned,  Anita Pollard has done fine, and I feel very comfortable releasing her to her primary care physician. All she will need is yearly mammography and a yearly physician breast exam.  I wish I could be helpful with regards to her other problems, but she does have very good  physicians helping her particularly with the kidney issues. She could consider raloxifene for her osteopenia, with the understanding that it can increase the risk of blood clots. She could consider Questran for the diarrhea she gets from her peritoneal dialysis. Since we are not planning followup here I suggested she discuss this with Dr.Murea at her next visit at [facility]. We did not place either of those prescriptions today.  Anita Pollard knows we will be glad to see her at any point in the future as need arises. However we are not making further routine followup appointment for her here.  Urias Sheek C    09/04/2013

## 2013-09-05 NOTE — Addendum Note (Signed)
Addended by: Jaci Carrel A on: 09/05/2013 10:18 AM   Modules accepted: Orders

## 2013-09-30 HISTORY — PX: KIDNEY TRANSPLANT: SHX239

## 2013-11-23 IMAGING — XA IR REMOVAL TUNNELED CV CATH
1 series · 1 of 1 positions shown · non-contrast
Comparison: none

CLINICAL DATA: Resolved renal failure, catheter access no longer
required

[Series 1: run · 1 of 1 slices shown]
[im 1/1]
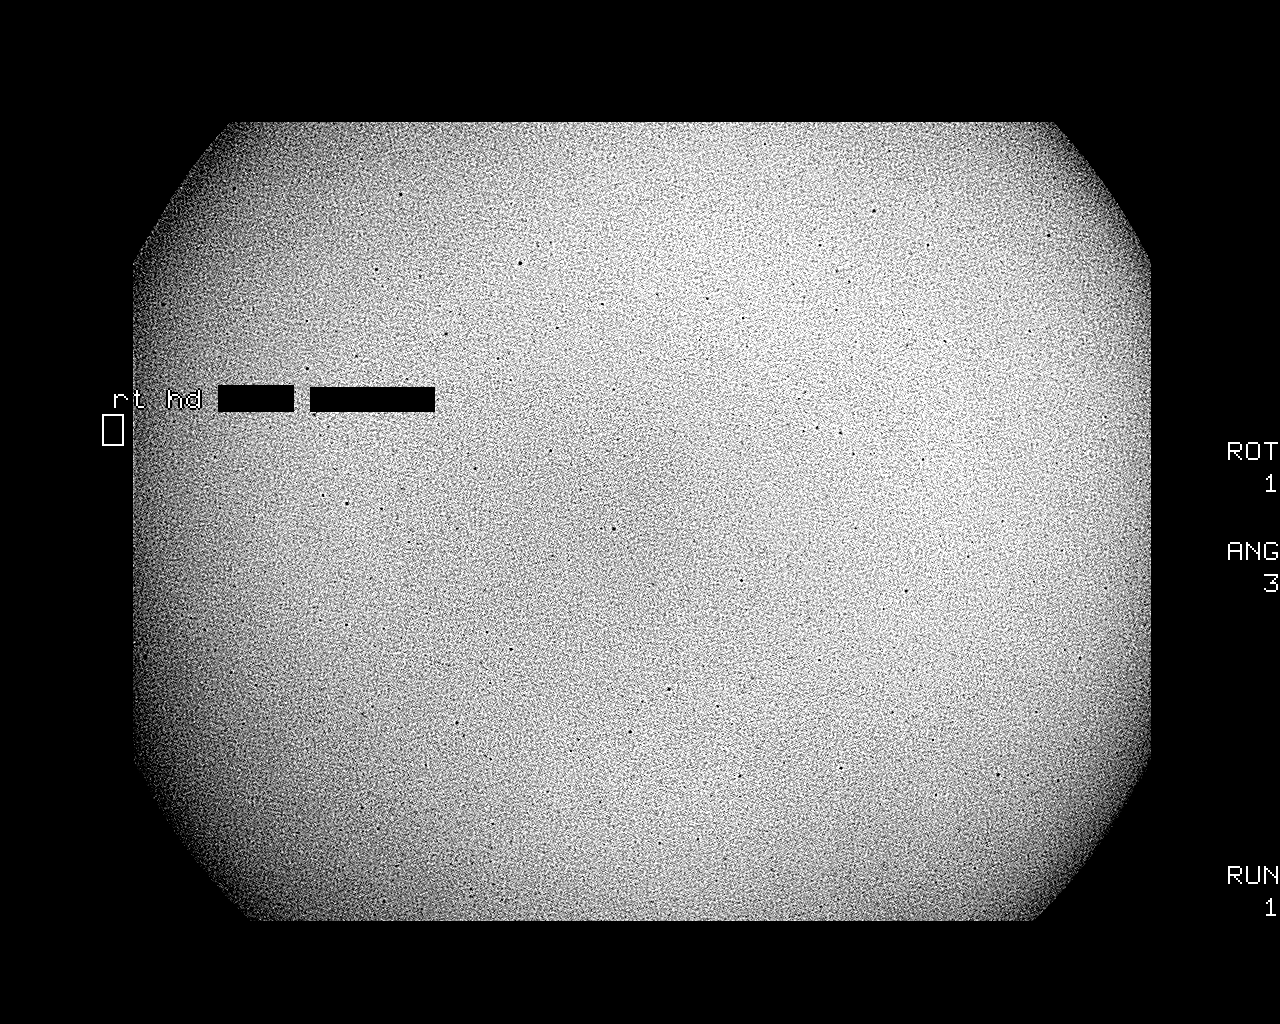

[1 of 1 positions shown; findings below may reference images not displayed]

RIGHT INTERNAL JUGULARTUNNELED HEMODIAYLISIS PO YEE REMOVAL

Date:  10/23/2012 [DATE]

Radiologist:  Tewe Gimpel, M.D.

Medications:  1% lidocaine locally

Complications:  No immediate

PROCEDURE/FINDINGS:

Informed consent was obtained from the patient following
explanation of the procedure, risks, benefits and alternatives.
The patient understands, agrees and consents for the procedure.
All questions were addressed.  A time out was performed.

Maximal barrier sterile technique utilized including caps, mask,
sterile gowns, sterile gloves, large sterile drape, hand hygiene,
and Chloroprep.

1% lidocaine was injected under sterile conditions along the
subcutaneous tunnel.  The right internal jugulartunneled catheter
was removed utilizing blunt and sharp dissection to release the
retention cuff.  The catheter was removed entirely.  Hemostasis was
obtained with compression.  No immediate complication.  Sterile
dressing applied.  The patient tolerated the procedure well.
IMPRESSION: Successful right internal jugulartunnel dialysis catheter removal

## 2014-02-27 ENCOUNTER — Other Ambulatory Visit: Payer: Self-pay | Admitting: Family Medicine

## 2014-02-27 DIAGNOSIS — R9389 Abnormal findings on diagnostic imaging of other specified body structures: Secondary | ICD-10-CM

## 2014-03-04 ENCOUNTER — Ambulatory Visit
Admission: RE | Admit: 2014-03-04 | Discharge: 2014-03-04 | Disposition: A | Discharge status: Home / Self Care | Payer: Medicare Other | Source: Ambulatory Visit | Attending: Family Medicine | Admitting: Family Medicine

## 2014-03-04 DIAGNOSIS — R9389 Abnormal findings on diagnostic imaging of other specified body structures: Secondary | ICD-10-CM

## 2014-03-11 ENCOUNTER — Ambulatory Visit (INDEPENDENT_AMBULATORY_CARE_PROVIDER_SITE_OTHER): Payer: Medicare Other | Admitting: Podiatrist

## 2014-03-11 ENCOUNTER — Encounter: Payer: Self-pay | Admitting: Podiatrist

## 2014-03-11 DIAGNOSIS — E1140 Type 2 diabetes mellitus with diabetic neuropathy, unspecified: Secondary | ICD-10-CM

## 2014-03-11 DIAGNOSIS — M216X2 Other acquired deformities of left foot: Secondary | ICD-10-CM

## 2014-03-11 DIAGNOSIS — Q828 Other specified congenital malformations of skin: Secondary | ICD-10-CM

## 2014-03-16 ENCOUNTER — Other Ambulatory Visit: Payer: Self-pay | Admitting: Dermatology

## 2014-03-16 NOTE — Progress Notes (Signed)
   HPI: Patient is 73 y.o. female who presents today for followup of calloused lesions plantar aspect of the left foot- submetatarsal 2.   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  Physical Exam  GENERAL APPEARANCE: Alert, conversant. Appropriately groomed. No acute distress.  VASCULAR: Pedal pulses palpable at 2/4 DP and PT bilateral. Capillary refill time is immediate to all digits,  NEUROLOGIC: sensation is intact epicritically and protectively to 5.07 monofilament at 5/5 sites bilateral. Light touch is intact bilateral, vibratory sensation intact bilateral,  MUSCULOSKELETAL: acceptable muscle strength, tone and stability bilateral. 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   Assessment: Porokeratotic lesion left foot, prominent metatarsal left  Plan: Excise the deeply enucleated lesions with a 15 blade without complication. Added salinocaine and an donut aperture pad. She will be seen back on as-needed basis for followup and if any problems or concerns arise she is instructed to call. If the lesion returns we discussed orthotics with accomidation.

## 2014-05-06 ENCOUNTER — Other Ambulatory Visit: Payer: Self-pay | Admitting: Oncology

## 2014-05-06 DIAGNOSIS — Z853 Personal history of malignant neoplasm of breast: Secondary | ICD-10-CM

## 2014-05-06 DIAGNOSIS — Z9889 Other specified postprocedural states: Secondary | ICD-10-CM

## 2014-08-31 ENCOUNTER — Ambulatory Visit
Admission: RE | Admit: 2014-08-31 | Discharge: 2014-08-31 | Disposition: A | Discharge status: Home / Self Care | Payer: Medicare Other | Source: Ambulatory Visit | Attending: Oncology | Admitting: Oncology

## 2014-08-31 DIAGNOSIS — Z9889 Other specified postprocedural states: Secondary | ICD-10-CM

## 2014-08-31 DIAGNOSIS — Z853 Personal history of malignant neoplasm of breast: Secondary | ICD-10-CM

## 2014-09-21 ENCOUNTER — Other Ambulatory Visit: Payer: Self-pay

## 2014-10-05 ENCOUNTER — Encounter: Payer: Self-pay | Admitting: Gynecologic Oncology

## 2014-10-05 ENCOUNTER — Other Ambulatory Visit: Payer: Self-pay | Admitting: Oncology

## 2014-10-05 ENCOUNTER — Ambulatory Visit: Payer: Medicare Other | Attending: Gynecologic Oncology | Admitting: Gynecologic Oncology

## 2014-10-05 VITALS — BP 144/64 | HR 71 | Temp 97.8°F | Resp 18 | Ht 61.0 in | Wt 131.6 lb

## 2014-10-05 DIAGNOSIS — R1900 Intra-abdominal and pelvic swelling, mass and lump, unspecified site: Secondary | ICD-10-CM | POA: Diagnosis present

## 2014-10-05 NOTE — Patient Instructions (Signed)
Dr. Denman George would like you to have an ultrasound next summer, 2017. Please call our office with any questions or concerns in the meantime.

## 2014-10-05 NOTE — Progress Notes (Signed)
Consult Note: Gyn-Onc  Anita Pollard 74 y.o. female  CC:  Chief Complaint  Patient presents with  . pelvic mass   Assessment/Plan: 74 year old with history of peritoneal dialysis and a history of a right pelvic cysts that developed on peritoneal dialysis, now with a normal pelvic US with non-visible ovaries and no fluid collection. She has a family history of ovarian cancer and a personal history of breast cancer. Her transplant surgeon (per patient) does not recommend elective oophorectomy/pelvic surgery.  I had a lengthy discussion with Anita Pollard about my low suspicion for occult ovarian cancer. I explained that the cystic collection that had been identified in 2014 and 2015 has now resolved on pelvic ultrasound. Therefore I've no suspicion for malignancy. Her personal and family history for breast and ovarian cancer certainly puts her at increased baseline risk for the disease. I would place her risk at a proximally one in 20. Ideally she would undergo bracket testing and prophylactic BSO if she had no coexisting comorbidities. However that is not the case. 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 Instead I am recommending annual surveillance with transvaginal ultrasounds. If she develops concerning features on these week and reevaluate the risk-benefit profile of nephrectomy versus expectant management.  HPI: Patient is a 74 year old who we saw in December at the request of Dr. Ronita Hipps for evaluation of bilateral adnexal masses. The patient is status post hysterectomy. She has a family history of ovarian cancer. I had previously seen her here for discussion regarding bilateral salpingo-oophorectomy genetic testing and she declined. She herself has a personal history of breast cancer 2010. 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 Most recently 2 weeks ago she has a peritoneal dialysis machine at home and when tested in the evening.  She had undergone ultrasound after reporting lower abdominal discomfort. She states that the pain was a 10 out of 10 that she needed to take pain medication. Should ultrasound performed that revealed the left adnexa to be 5.4 x 1.9 x 2.4 cm in the right adnexa 5.5 x 2.6 cm. There is free fluid and some features that were complex. After discussion the patient opted to have close followup. She had a repeat ultrasound performed on March 3. It reveals that the right ovary was not visualized. There was a complex fluid collection seen in the right adnexa containing internal septations. The collection measured 6.7 x 2.2 x 4.4 cm. The catheter was also seen within his fluid collection likely representing a peritoneal dialysis catheter. The left ovary was not visualized. The small amount of free fluid seen in the left adnexa no definitive cystic or solid mass is visualized. The radiologic impression was that this could represent a peritoneal inclusion cyst however a cystic neoplasm could not be excluded and they suggested pelvic MRI for further evaluation.  MRI was performed at that time and confirmed a nonvisible right ovary, with no separate right ovarian cyst, and suspected that the appearance on ultrasound was due to slight loculation in the right pelvis secondary to her dialysis catheter. The impression from that MRI on 06/22/2013 was noted large ovarian cystic lesion observed, I suspect that the loculated appearance on ultrasound was due to  location of perineal fluid associated with peritoneal dialysis.  Interval History:  She subsequently undergone a renal transplant and removal of the peritoneal dialysis catheter. The transplant was  successful and she is on anti-rejection medication. She had 2 kidneys transplanted.  She was recently seen by her nephrologist at [facility] who reviewed her 2015 ultrasound and was concerned about that previously noted finding of right pelvic fluid. The patient also had reported her nephrologist feeling full of fluid in the lower abdomen.  She denies symptoms of consistent abdominal pelvic pain, change in bladder or bowel habit or early satiety.  Review of Systems  Constitutional: Feels generally well Skin: No rash, bruises easily on prednisone Cardiovascular: No chest pain, shortness of breath, +edema  Pulmonary: No cough Gastro Intestinal: Reporting intermittent lower abdominal fullness.  No nausea, vomiting, constipation, no diarrhea reported. No bright red blood per rectum. Genitourinary: no bleeding Musculoskeletal: No myalgia, arthralgia, joint swelling or pain.  Neurologic: No weakness, numbness, or change in gait.  Psychology: No changes, worried   Current Meds:  Outpatient Encounter Prescriptions as of 10/05/2014  Medication Sig  . amLODipine (NORVASC) 5 MG tablet Take 5 mg by mouth.  Marland Kitchen aspirin EC 81 MG tablet Take 81 mg by mouth.  . Cholecalciferol (VITAMIN D3) 5000 UNITS TABS Take 1 tablet by mouth daily.  Marland Kitchen glipiZIDE (GLUCOTROL XL) 2.5 MG 24 hr tablet Take 5 mg by mouth.  . Glucosamine-Chondroit-Vit C-Mn (GLUCOSAMINE 1500 COMPLEX) CAPS Take by mouth 3 (three) times daily.  . Lancets (FREESTYLE) lancets by Does not apply route.  . linagliptin (TRADJENTA) 5 MG TABS tablet Take 5 mg by mouth.  . magnesium oxide (MAGOX 400) 400 (241.3 MG) MG tablet Take 400 mg by mouth.  . Multiple Vitamin (MULTIVITAMIN) tablet Take 1 tablet by mouth daily.  . mycophenolate (MYFORTIC) 180 MG EC tablet Take 180 mg by mouth.  Marland Kitchen omeprazole (PRILOSEC) 20 MG capsule Take 20 mg by mouth daily.  . Pancrelipase, Lip-Prot-Amyl, (CREON) 6000 UNITS CPEP Take 6,000 units of lipase by mouth.  .  pramipexole (MIRAPEX) 0.125 MG tablet Take 0.125 mg by mouth.  . predniSONE (DELTASONE) 5 MG tablet Take 5 mg by mouth.  . Probiotic Product (PROBIOTIC DAILY) CAPS Take one capsule every other day  . QUEtiapine (SEROQUEL) 50 MG tablet   . sulfamethoxazole-trimethoprim (BACTRIM,SEPTRA) 400-80 MG per tablet Take 1 tablet by mouth.  . SYNTHROID 175 MCG tablet   . tacrolimus (PROGRAF) 1 MG capsule '2mg'$  am and '1mg'$  pm  . temazepam (RESTORIL) 15 MG capsule as needed.   . traZODone (DESYREL) 50 MG tablet   . b complex vitamins tablet Take 1 tablet by mouth daily.  . furosemide (LASIX) 20 MG tablet Take 20 mg by mouth.  Marland Kitchen KRILL OIL 1000 MG CAPS Take one tablet daily  . metoprolol tartrate (LOPRESSOR) 25 MG tablet Take 25 mg by mouth.  . oxyCODONE (OXY IR/ROXICODONE) 5 MG immediate release tablet   . promethazine (PHENERGAN) 25 MG tablet as needed.   . sevelamer carbonate (RENVELA) 800 MG tablet Take 800 mg by mouth.  . [DISCONTINUED] levothyroxine (SYNTHROID) 137 MCG tablet Take 175 mcg by mouth daily before breakfast.   . [DISCONTINUED] pramipexole (MIRAPEX) 0.25 MG tablet    No facility-administered encounter medications on file as of 10/05/2014.    Allergy:  Allergies  Allergen Reactions  . Penicillins Swelling and Itching    Swelling and flushing  . Banana Nausea Only    Green bananas   . Detrol [  Tolterodine]     Leg cramps  . Quetiapine Other (See Comments)    Leg cramps/restless leg    Social Hx:   History   Social History  . Marital Status: Widowed    Spouse Name: N/A  . Number of Children: N/A  . Years of Education: N/A   Occupational History  . Not on file.   Social History Main Topics  . Smoking status: Former Smoker    Quit date: 09/25/1983  . Smokeless tobacco: Never Used  . 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 Drug Use: No  . Sexual Activity: No   Other Topics Concern   . Not on file   Social History Narrative    Past Surgical Hx:  Past Surgical History  Procedure Laterality Date  . Thyroidectomy    . Ductal carcinoma      right breast   . Abdominoplasty    . Tummy tuck    . Abdominal hysterectomy    . Appendectomy    . Cosmetic surgery    . Insertion of dialysis catheter Right 09/25/2012    Procedure: INSERTION OF DIALYSIS CATHETER;  Surgeon: Mal Misty, MD;  Location: [facility];  Service: Vascular;  Laterality: Right;  Righ Internal Jugular Placement  . Kidney transplant Bilateral 09/30/2013    Past Medical Hx:  Past Medical History  Diagnosis Date  . Hypothyroidism   . GERD (gastroesophageal reflux disease)   . HTN (hypertension)   . Diabetes mellitus   . Arthritis   . Anemia     Oncology Hx:   No history exists.    Family Hx:  Family History  Problem Relation Age of Onset  . Thyroid cancer Daughter 56  . Diabetes Father   . Diabetes Brother     Vitals:  Blood pressure 144/64, pulse 71, temperature 97.8 F (36.6 C), temperature source Oral, resp. rate 18, height '5\' 1"'$  (1.549 m), weight 131 lb 9.6 oz (59.693 kg), SpO2 100 %.  Physical Exam: Thin female in no acute distress.  Abdomen: Mild fullness in the subcutaneous lower abdominal tissues, no masses, no ascites wave. She has some generalized edema overlying her mons. There is no lymphadenopathy.  Extremities: 1+ pitting edema equal bilaterally primarily around the ankles.  Pelvic: External genitalia appropriate for age. Bimanual examination reveals no masses nodularity or tenderness within the pelvis. She has no abnormal fullness and no masses appreciated. Rectovaginal exam is confirmatory and demonstrates no nodularity or fullness.     Donaciano Eva, MD 10/05/2014, 12:21 PM

## 2014-10-05 NOTE — Progress Notes (Unsigned)
Makhia is doing well.     Her [place] at [facility] on 09/16/14 showed non-visualized ovaries (likely too small which is reassuring) and resolution of the previously seen fluid collection.    No need for surgical intervention. Recommend annual [place] per patient's request due to her family hx of ovarian cancer.        Donaciano Eva, MD

## 2014-10-13 ENCOUNTER — Other Ambulatory Visit: Payer: Self-pay | Admitting: Family Medicine

## 2014-10-13 ENCOUNTER — Ambulatory Visit
Admission: RE | Admit: 2014-10-13 | Discharge: 2014-10-13 | Disposition: A | Discharge status: Home / Self Care | Payer: Medicare Other | Source: Ambulatory Visit | Attending: Family Medicine | Admitting: Family Medicine

## 2014-10-13 DIAGNOSIS — M25571 Pain in right ankle and joints of right foot: Secondary | ICD-10-CM

## 2014-11-11 ENCOUNTER — Ambulatory Visit (INDEPENDENT_AMBULATORY_CARE_PROVIDER_SITE_OTHER): Payer: Medicare Other | Admitting: Podiatry

## 2014-11-11 ENCOUNTER — Ambulatory Visit (INDEPENDENT_AMBULATORY_CARE_PROVIDER_SITE_OTHER): Payer: Medicare Other

## 2014-11-11 ENCOUNTER — Encounter: Payer: Self-pay | Admitting: Podiatry

## 2014-11-11 VITALS — BP 134/70 | HR 82 | Resp 16

## 2014-11-11 DIAGNOSIS — M216X2 Other acquired deformities of left foot: Secondary | ICD-10-CM

## 2014-11-11 DIAGNOSIS — M779 Enthesopathy, unspecified: Secondary | ICD-10-CM

## 2014-11-11 MED ORDER — TRIAMCINOLONE ACETONIDE 10 MG/ML IJ SUSP
10.0000 mg | Freq: Once | INTRAMUSCULAR | Status: AC
  Administered 2014-11-11: 10 mg

## 2014-11-11 NOTE — Progress Notes (Signed)
Subjective:     Patient ID: Anita Pollard, female   DOB: [birth year], 74 y.o.   MRN: [id number]  HPI patient presents with pain in the lateral side of the right ankle with inflammation and fluid buildup noted   Review of Systems     Objective:   Physical Exam  neurovascular status intact no other change in health history with inflammation and pain in the right lateral ankle with no indications of tendon dysfunction or tear    Assessment:      probable tendinitis of the right lateral ankle peroneal tendon    Plan:      H&P and condition and x-rays reviewed. Did discuss possibility for sheath injection if symptoms continue to persist and may need to eventually get an MRI. Today I did a careful sheath injection right 3 mg Kenalog 5 mg Xylocaine advised on ice and dispensed fascial brace with instructions on bringing the foot and a lateral direction with exercise. Reappoint to recheck as needed

## 2014-12-16 ENCOUNTER — Encounter: Payer: Self-pay | Admitting: Podiatry

## 2014-12-16 ENCOUNTER — Ambulatory Visit (INDEPENDENT_AMBULATORY_CARE_PROVIDER_SITE_OTHER): Payer: Medicare Other | Admitting: Podiatry

## 2014-12-16 DIAGNOSIS — M79671 Pain in right foot: Secondary | ICD-10-CM | POA: Diagnosis not present

## 2014-12-16 DIAGNOSIS — M7661 Achilles tendinitis, right leg: Secondary | ICD-10-CM | POA: Diagnosis not present

## 2014-12-16 DIAGNOSIS — E1140 Type 2 diabetes mellitus with diabetic neuropathy, unspecified: Secondary | ICD-10-CM

## 2014-12-16 MED ORDER — TRIAMCINOLONE ACETONIDE 10 MG/ML IJ SUSP: 10.0000 mg | Freq: Once | INTRAMUSCULAR | Status: DC

## 2014-12-16 NOTE — Progress Notes (Signed)
Subjective:     Patient ID: Anita Pollard, female   DOB: 1940-07-18, 74 y.o.   MRN: [id number]  HPI patient states my right back of the heel has started to bother me over the last few weeks and the side is doing better but this hurts   Review of Systems     Objective:   Physical Exam Neurovascular status intact muscle strength adequate range of motion within normal limits with pain in the posterior aspect of the right heel of a moderate nature with no redness or swelling noted    Assessment:     Achilles tendinitis right moderate in nature    Plan:     no injection at this time I did recommend a night splint to try to support the plantar and posterior heel and advised on short periods of heat and ice with splint. Gave instructions on stretching exercises and reappoint in 4 weeks or earlier if needed

## 2014-12-16 NOTE — Patient Instructions (Addendum)
ICE INSTRUCTIONS  Apply ice/cold and heat pack to the affected area at least 3 times a day for 3-5 minutes each time.  You should also use ice after prolonged activity or vigorous exercise.  Do not apply ice or heat longer than 20 minutes at one time.  Always keep a cloth between your skin and the ice pack to prevent burns.  Being consistent and following these instructions will help control your symptoms.  We suggest you purchase a gel ice or heat pack because they are reusable and do bit leak.  Some of them are designed to wrap around the area.  Use the method that works best for you.  Here are some other suggestions for icing.

## 2015-01-18 ENCOUNTER — Ambulatory Visit: Payer: Medicare Other | Admitting: Podiatry

## 2015-02-05 ENCOUNTER — Telehealth: Payer: Self-pay | Admitting: *Deleted

## 2015-02-05 ENCOUNTER — Ambulatory Visit (INDEPENDENT_AMBULATORY_CARE_PROVIDER_SITE_OTHER): Payer: Medicare Other

## 2015-02-05 ENCOUNTER — Ambulatory Visit (INDEPENDENT_AMBULATORY_CARE_PROVIDER_SITE_OTHER): Payer: Medicare Other | Admitting: Podiatry

## 2015-02-05 ENCOUNTER — Encounter: Payer: Self-pay | Admitting: Podiatry

## 2015-02-05 VITALS — BP 152/80 | HR 70 | Resp 16

## 2015-02-05 DIAGNOSIS — M7661 Achilles tendinitis, right leg: Secondary | ICD-10-CM | POA: Diagnosis not present

## 2015-02-05 DIAGNOSIS — M79671 Pain in right foot: Secondary | ICD-10-CM

## 2015-02-05 MED ORDER — NONFORMULARY OR COMPOUNDED ITEM: Status: DC

## 2015-02-05 NOTE — Telephone Encounter (Signed)
Orders faxed with insurance and pt demographics to [place].

## 2015-02-07 NOTE — Progress Notes (Signed)
Subjective:     Patient ID: Anita Pollard, female   DOB: June 22, 1940, 74 y.o.   MRN: [id number]  HPI patient presents stating I'm still having a lot of pain in the Achilles tendon right at the insertion to the calcaneus and it is simply making it hard for me to be   Review of Systems     Objective:   Physical Exam Neurovascular status intact muscle strength adequate with discomfort posterior aspect right heel localized in nature that is very tender when pressed on the medial side making walking difficult    Assessment:     Achilles tendinitis right that so far as not responded to conservative treatments    Plan:     Reviewed condition and recommended shockwave therapy to try to reduce the condition and cure her problem. Educated her on this and today dispensed air fracture walker short with instructions on usage and patient will be seen next week for the initiation of shockwave therapy

## 2015-02-08 ENCOUNTER — Ambulatory Visit (INDEPENDENT_AMBULATORY_CARE_PROVIDER_SITE_OTHER): Payer: Medicare Other | Admitting: Podiatry

## 2015-02-08 DIAGNOSIS — M7661 Achilles tendinitis, right leg: Secondary | ICD-10-CM

## 2015-02-08 NOTE — Progress Notes (Signed)
Subjective:     Patient ID: Anita Pollard, female   DOB: 1940-04-29, 74 y.o.   MRN: [id number]  HPI patient presents for shockwave #1 right heel Achilles tendon lateral side   Review of Systems     Objective:   Physical Exam Neurovascular status intact exquisite discomfort posterior aspect right heel    Assessment:     Achilles tendinitis right    Plan:     Reviewed condition and administered shockwave 3000 shocks at 3.2 intensity 17 frequency

## 2015-02-15 ENCOUNTER — Ambulatory Visit (INDEPENDENT_AMBULATORY_CARE_PROVIDER_SITE_OTHER): Payer: Medicare Other | Admitting: Podiatry

## 2015-02-15 DIAGNOSIS — M7661 Achilles tendinitis, right leg: Secondary | ICD-10-CM

## 2015-02-16 NOTE — Progress Notes (Signed)
Subjective:     Patient ID: Anita Pollard, female   DOB: 11-Oct-1940, 74 y.o.   MRN: [id number]  HPI patient states my heel is doing pretty well with slight improvement   Review of Systems     Objective:   Physical Exam Neurovascular status intact negative Homans sign was noted with right posterior lateral heel that's mildly improved    Assessment:     Achilles tendinitis with mild improvement    Plan:     Shockwave administered 3000 shocks 3.8 intensity 17 frequency tolerated well and reappoint in 1 week

## 2015-02-22 ENCOUNTER — Ambulatory Visit (INDEPENDENT_AMBULATORY_CARE_PROVIDER_SITE_OTHER): Payer: Medicare Other | Admitting: Podiatry

## 2015-02-22 DIAGNOSIS — M7661 Achilles tendinitis, right leg: Secondary | ICD-10-CM

## 2015-02-23 NOTE — Progress Notes (Signed)
Subjective:     Patient ID: ZAKAIYA LARES, female   DOB: March 28, 1940, 74 y.o.   MRN: [id number]  HPI patient presents stating the heel is still moderately tender right   Review of Systems     Objective:   Physical Exam Neurovascular status intact with pain in the right plantar heel improved but present    Assessment:     Continued heel pain right    Plan:     Shockwave administered 3000 shocks 4.2 intensity 17 frequency and reappoint 4 weeks and instructed on continued boot usage on occasional basis

## 2015-03-30 ENCOUNTER — Telehealth: Payer: Self-pay | Admitting: *Deleted

## 2015-03-30 NOTE — Telephone Encounter (Signed)
Pt states she is scheduled to have the shockwave to her ankle on 04/05/2015, but is currently having pain in the bottom of the heel.  Pt asked if she should have a sooner appt.  I told pt to ice area 10-15 minutes 3-4 times a day and rest the area, I would see if DR. Regal wanted to see her sooner and call with instructions.  Dr. Paulla Dolly states continue the ice to the area, will discuss options at the [facility] appt.  Informed pt of recommendations.

## 2015-04-05 ENCOUNTER — Other Ambulatory Visit: Payer: Medicare Other

## 2015-04-09 ENCOUNTER — Ambulatory Visit (INDEPENDENT_AMBULATORY_CARE_PROVIDER_SITE_OTHER): Payer: Medicare Other | Admitting: Podiatry

## 2015-04-09 DIAGNOSIS — M722 Plantar fascial fibromatosis: Secondary | ICD-10-CM | POA: Diagnosis not present

## 2015-04-09 MED ORDER — TRIAMCINOLONE ACETONIDE 10 MG/ML IJ SUSP
10.0000 mg | Freq: Once | INTRAMUSCULAR | Status: AC
  Administered 2015-04-09: 10 mg

## 2015-04-11 NOTE — Progress Notes (Signed)
Subjective:     Patient ID: Anita Pollard, female   DOB: 1940-04-16, 75 y.o.   MRN: [id number]  HPI patient states the back of my heel is feeling good but I'm having pain underneath the heel that's new   Review of Systems     Objective:   Physical Exam Neurovascular status intact significant diminishment of discomfort posterior heel right secondary to shockwave with exquisite discomfort plantar aspect right heel    Assessment:     Resolving Achilles tendinitis right with shockwave and acute plantar fasciitis right    Plan:     Reviewed both conditions and for the posterior heel I advised on ice and I injected the plantar heel 3 mg Kenalog 5 mill grams Xylocaine and reappoint to recheck

## 2015-04-12 ENCOUNTER — Other Ambulatory Visit: Payer: Medicare Other

## 2015-04-16 ENCOUNTER — Ambulatory Visit (INDEPENDENT_AMBULATORY_CARE_PROVIDER_SITE_OTHER): Payer: Medicare Other | Admitting: Podiatry

## 2015-04-16 ENCOUNTER — Encounter: Payer: Self-pay | Admitting: Podiatry

## 2015-04-16 VITALS — BP 138/78 | HR 70 | Resp 12

## 2015-04-16 DIAGNOSIS — M722 Plantar fascial fibromatosis: Secondary | ICD-10-CM

## 2015-04-16 DIAGNOSIS — M7661 Achilles tendinitis, right leg: Secondary | ICD-10-CM | POA: Diagnosis not present

## 2015-04-16 NOTE — Progress Notes (Signed)
Subjective:     Patient ID: Anita Pollard, female   DOB: 07/13/1940, 75 y.o.   MRN: [id number]  HPI patient states heel is doing much better   Review of Systems     Objective:   Physical Exam Neurovascular status intact with minimal discomfort plantar posterior heel    Assessment:     Doing better after having plantar fascial and posterior heel treatments    Plan:     Reviewed the continued utilization of supportive shoes anti-inflammatories and reappoint to recheck

## 2015-05-10 ENCOUNTER — Ambulatory Visit (INDEPENDENT_AMBULATORY_CARE_PROVIDER_SITE_OTHER): Payer: Medicare Other | Admitting: Podiatry

## 2015-05-10 ENCOUNTER — Encounter: Payer: Self-pay | Admitting: Podiatry

## 2015-05-10 ENCOUNTER — Ambulatory Visit: Payer: Medicare Other

## 2015-05-10 VITALS — BP 131/76 | HR 66 | Resp 12

## 2015-05-10 DIAGNOSIS — M25571 Pain in right ankle and joints of right foot: Secondary | ICD-10-CM | POA: Diagnosis not present

## 2015-05-10 DIAGNOSIS — M779 Enthesopathy, unspecified: Secondary | ICD-10-CM

## 2015-05-10 DIAGNOSIS — M7661 Achilles tendinitis, right leg: Secondary | ICD-10-CM | POA: Diagnosis not present

## 2015-05-10 MED ORDER — TRIAMCINOLONE ACETONIDE 10 MG/ML IJ SUSP
10.0000 mg | Freq: Once | INTRAMUSCULAR | Status: AC
  Administered 2015-05-10: 10 mg

## 2015-05-11 NOTE — Progress Notes (Signed)
Subjective:     Patient ID: Anita Pollard, female   DOB: 01-08-1941, 75 y.o.   MRN: [id number]  HPI patient states my right ankle pain is bad and it seems to be in a slightly different position than previously   Review of Systems     Objective:   Physical Exam  neurovascular status intact muscle strength adequate with inflammation around the lateral side of the peroneal group as it comes under the lateral malleolus with at this time minimal discomfort in the Achilles tendon insertion into the calcaneus    Assessment:      seems to be improved from Achilles tendinitis with shockwave therapy with probable peroneal compensatory tendinitis    Plan:      we will start this patient on topical and I ordered a topical compound cream for her to reduce inflammation and I did a careful lateral sheath injection 3 mg dexamethasone Kenalog 5 mg Xylocaine to reduce the inflammation in the lateral component. Patient will be seen back to recheck

## 2015-05-31 ENCOUNTER — Ambulatory Visit: Payer: Medicare Other | Admitting: Podiatry

## 2015-06-07 ENCOUNTER — Ambulatory Visit: Payer: Medicare Other | Admitting: Podiatry

## 2015-06-07 ENCOUNTER — Ambulatory Visit (INDEPENDENT_AMBULATORY_CARE_PROVIDER_SITE_OTHER): Payer: Medicare Other | Admitting: Podiatry

## 2015-06-07 ENCOUNTER — Encounter: Payer: Self-pay | Admitting: Podiatry

## 2015-06-07 VITALS — BP 149/75 | HR 67 | Resp 16

## 2015-06-07 DIAGNOSIS — M7661 Achilles tendinitis, right leg: Secondary | ICD-10-CM

## 2015-06-07 DIAGNOSIS — M779 Enthesopathy, unspecified: Secondary | ICD-10-CM

## 2015-06-07 NOTE — Progress Notes (Signed)
Subjective:     Patient ID: Anita Pollard, female   DOB: 03-13-1941, 75 y.o.   MRN: [id number]  HPI patient states my pain seems improved but I did have some discomfort in the outside of my foot for a couple weeks but that's better   Review of Systems     Objective:   Physical Exam Neurovascular status intact with diminished discomfort in the lateral side of the right ankle with diminished peroneal pain and minimal discomfort on the Achilles tendon currently    Assessment:     Doing very well after shockwave with minimal discomfort noted lateral    Plan:     Advised on ice therapy and shoe gear modifications and stretching exercises and at this time patient's discharge and less needs to be seen again

## 2015-06-24 ENCOUNTER — Ambulatory Visit (INDEPENDENT_AMBULATORY_CARE_PROVIDER_SITE_OTHER): Payer: Medicare Other | Admitting: Podiatry

## 2015-06-24 ENCOUNTER — Encounter: Payer: Self-pay | Admitting: Podiatry

## 2015-06-24 ENCOUNTER — Ambulatory Visit (INDEPENDENT_AMBULATORY_CARE_PROVIDER_SITE_OTHER): Payer: Medicare Other

## 2015-06-24 DIAGNOSIS — S93601A Unspecified sprain of right foot, initial encounter: Secondary | ICD-10-CM

## 2015-06-24 DIAGNOSIS — M8430XA Stress fracture, unspecified site, initial encounter for fracture: Secondary | ICD-10-CM

## 2015-06-25 NOTE — Progress Notes (Signed)
Subjective:     Patient ID: Anita Pollard, female   DOB: September 06, 1940, 75 y.o.   MRN: [id number]  HPI patient states my right heel is doing great but I'm having pain in the top of my foot and it almost feels like it broke   Review of Systems     Objective:   Physical Exam Neuro vascular status is intact muscle strength adequate with edema in the right forefoot around the third metatarsal shaft with quite a bit of pain when palpated within the metatarsal shaft itself    Assessment:     Indications of possible stress fracture versus tendinitis swelling right forefoot    Plan:     H&P condition reviewed and today I went ahead and I reviewed x-rays. I applied Ace wrap surgical shoe to support and patient will be seen back to recheck  X-ray report indicates that there is some subtle changes third metatarsal which may be a possible stress fracture of this area

## 2015-07-07 ENCOUNTER — Encounter: Payer: Self-pay | Admitting: Podiatry

## 2015-07-07 ENCOUNTER — Ambulatory Visit (INDEPENDENT_AMBULATORY_CARE_PROVIDER_SITE_OTHER): Payer: Medicare Other

## 2015-07-07 ENCOUNTER — Ambulatory Visit (INDEPENDENT_AMBULATORY_CARE_PROVIDER_SITE_OTHER): Payer: Medicare Other | Admitting: Podiatry

## 2015-07-07 VITALS — BP 156/81 | HR 71 | Resp 16

## 2015-07-07 DIAGNOSIS — M779 Enthesopathy, unspecified: Secondary | ICD-10-CM | POA: Diagnosis not present

## 2015-07-07 DIAGNOSIS — M8430XD Stress fracture, unspecified site, subsequent encounter for fracture with routine healing: Secondary | ICD-10-CM

## 2015-07-08 NOTE — Progress Notes (Signed)
Subjective:     Patient ID: Anita Pollard, female   DOB: [birth year], 75 y.o.   MRN: [id number]  HPI patient states the foot is improving some but she still has swelling and is still concerned about fracture   Review of Systems     Objective:   Physical Exam Neurovascular status intact muscle strength adequate range of motion within normal limits with patient noted to have continued swelling in the dorsum of the right foot around the third and second metatarsal distal shaft with pain when palpated    Assessment:     Continued indications of stress fracture right    Plan:     X-ray taken reviewed. At this point I have recommended utilization of surgical shoe with gradual return to soft shoes over the next 4 weeks. Patient be seen back as needed and hopefully this will completely resolve X-ray report indicated that there is some stress within the distal third metatarsal shaft but it is localized

## 2015-07-26 ENCOUNTER — Other Ambulatory Visit: Payer: Self-pay | Admitting: Family Medicine

## 2015-07-26 DIAGNOSIS — Z853 Personal history of malignant neoplasm of breast: Secondary | ICD-10-CM

## 2015-07-26 DIAGNOSIS — Z85841 Personal history of malignant neoplasm of brain: Secondary | ICD-10-CM

## 2015-07-27 ENCOUNTER — Other Ambulatory Visit: Payer: Self-pay | Admitting: Family Medicine

## 2015-07-27 DIAGNOSIS — M8580 Other specified disorders of bone density and structure, unspecified site: Secondary | ICD-10-CM

## 2015-08-04 ENCOUNTER — Ambulatory Visit (INDEPENDENT_AMBULATORY_CARE_PROVIDER_SITE_OTHER): Payer: Medicare Other | Admitting: Podiatry

## 2015-08-04 ENCOUNTER — Ambulatory Visit (INDEPENDENT_AMBULATORY_CARE_PROVIDER_SITE_OTHER): Payer: Medicare Other

## 2015-08-04 ENCOUNTER — Encounter: Payer: Self-pay | Admitting: Podiatry

## 2015-08-04 VITALS — BP 138/70 | HR 74 | Resp 16

## 2015-08-04 DIAGNOSIS — M8430XD Stress fracture, unspecified site, subsequent encounter for fracture with routine healing: Secondary | ICD-10-CM | POA: Diagnosis not present

## 2015-08-04 NOTE — Progress Notes (Signed)
Subjective:     Patient ID: Anita Pollard, female   DOB: 09-23-1940, 75 y.o.   MRN: [id number]  HPI patient states I seem to be somewhat improved but still having some pain   Review of Systems     Objective:   Physical Exam Neurovascular status intact with mild edema in the dorsum of the right foot that still present with pain that has minimized but is still present upon palpation    Assessment:     Improving stress fracture-like pain right    Plan:     Advised on continued surgical shoe usage with gradual return soft shoe over the next 4 weeks and if symptoms persist reevaluate  X-rays indicate no indication of progression of fracture with healing of a satisfactory level and this metatarsal shaft

## 2015-09-06 ENCOUNTER — Ambulatory Visit
Admission: RE | Admit: 2015-09-06 | Discharge: 2015-09-06 | Disposition: A | Discharge status: Home / Self Care | Payer: Medicare Other | Source: Ambulatory Visit | Attending: Family Medicine | Admitting: Family Medicine

## 2015-09-06 DIAGNOSIS — Z853 Personal history of malignant neoplasm of breast: Secondary | ICD-10-CM

## 2015-09-06 DIAGNOSIS — M8580 Other specified disorders of bone density and structure, unspecified site: Secondary | ICD-10-CM

## 2015-10-07 ENCOUNTER — Other Ambulatory Visit: Payer: Self-pay | Admitting: Family Medicine

## 2015-10-07 DIAGNOSIS — I6523 Occlusion and stenosis of bilateral carotid arteries: Secondary | ICD-10-CM

## 2015-11-17 ENCOUNTER — Ambulatory Visit
Admission: RE | Admit: 2015-11-17 | Discharge: 2015-11-17 | Disposition: A | Discharge status: Home / Self Care | Payer: Medicare Other | Source: Ambulatory Visit | Attending: Family Medicine | Admitting: Family Medicine

## 2015-11-17 DIAGNOSIS — I6523 Occlusion and stenosis of bilateral carotid arteries: Secondary | ICD-10-CM

## 2015-12-13 ENCOUNTER — Ambulatory Visit: Payer: Medicare Other | Admitting: Podiatry

## 2015-12-29 ENCOUNTER — Encounter: Payer: Self-pay | Admitting: Podiatry

## 2015-12-29 ENCOUNTER — Ambulatory Visit (INDEPENDENT_AMBULATORY_CARE_PROVIDER_SITE_OTHER): Payer: Medicare Other

## 2015-12-29 ENCOUNTER — Ambulatory Visit (INDEPENDENT_AMBULATORY_CARE_PROVIDER_SITE_OTHER): Payer: Medicare Other | Admitting: Podiatry

## 2015-12-29 VITALS — BP 158/79 | HR 75 | Resp 16

## 2015-12-29 DIAGNOSIS — I6523 Occlusion and stenosis of bilateral carotid arteries: Secondary | ICD-10-CM

## 2015-12-29 DIAGNOSIS — M779 Enthesopathy, unspecified: Secondary | ICD-10-CM

## 2015-12-29 DIAGNOSIS — M25572 Pain in left ankle and joints of left foot: Secondary | ICD-10-CM

## 2015-12-29 MED ORDER — TRIAMCINOLONE ACETONIDE 10 MG/ML IJ SUSP
10.0000 mg | Freq: Once | INTRAMUSCULAR | Status: AC
Start: 2015-12-29 — End: 2015-12-29
  Administered 2015-12-29: 10 mg

## 2015-12-30 NOTE — Progress Notes (Signed)
Subjective:     Patient ID: Anita Pollard, female   DOB: 04-Dec-1940, 75 y.o.   MRN: [id number]  HPI patient presents stating that the left ankle has been quite sore and that she is worried she might have done subtalar by turning   Review of Systems     Objective:   Physical Exam Neurovascular status intact with inflammation and pain around the left ankle lateral side around sinus tarsi with possibility for acute trauma    Assessment:     Reviewed condition and careful injection administered in the sinus tarsi after reviewing x-rays and discussed inflammatory capsulitis with chronic disease    Plan:     Discussed inflammatory condition with fluid buildup and pain

## 2016-04-03 ENCOUNTER — Ambulatory Visit: Payer: Medicare Other | Admitting: Podiatry

## 2016-04-04 ENCOUNTER — Other Ambulatory Visit: Payer: Self-pay | Admitting: Orthopedic Surgery

## 2016-04-04 DIAGNOSIS — M5137 Other intervertebral disc degeneration, lumbosacral region: Secondary | ICD-10-CM

## 2016-04-05 ENCOUNTER — Ambulatory Visit (INDEPENDENT_AMBULATORY_CARE_PROVIDER_SITE_OTHER): Payer: Medicare Other

## 2016-04-05 ENCOUNTER — Ambulatory Visit (INDEPENDENT_AMBULATORY_CARE_PROVIDER_SITE_OTHER): Payer: Medicare Other | Admitting: Podiatry

## 2016-04-05 ENCOUNTER — Encounter: Payer: Self-pay | Admitting: Podiatry

## 2016-04-05 DIAGNOSIS — M7662 Achilles tendinitis, left leg: Secondary | ICD-10-CM | POA: Diagnosis not present

## 2016-04-05 DIAGNOSIS — M722 Plantar fascial fibromatosis: Secondary | ICD-10-CM | POA: Diagnosis not present

## 2016-04-05 MED ORDER — TRIAMCINOLONE ACETONIDE 10 MG/ML IJ SUSP
10.0000 mg | Freq: Once | INTRAMUSCULAR | Status: AC
  Administered 2016-04-05: 10 mg

## 2016-04-05 NOTE — Progress Notes (Signed)
Subjective:     Patient ID: Anita Pollard, female   DOB: 03-31-1940, 76 y.o.   MRN: [id number]  HPI patient presents stating that her left heel has been bothering her and that she had an injection in [place] but it only helped a little bit   Review of Systems     Objective:   Physical Exam Neurovascular status intact muscle strength adequate range of motion within normal limits with patient found to have discomfort plantar aspect left heel at the insertional point the tendon the calcaneus with fluid buildup around the medial band    Assessment:     Acute plantar fasciitis left heel at the insertional point tendon calcaneus    Plan:     H&P condition reviewed and injected the plantar fascial left 3 mg Kenalog 5 mg Xylocaine and applied fascial brace to support the arch. Reappoint to recheck  X-ray indicated very small spur with no indication stress fracture arthritis

## 2016-04-05 NOTE — Progress Notes (Signed)
   Subjective:    Patient ID: Anita Pollard, female    DOB: 11/17/1940, 76 y.o.   MRN: [id number]  HPI  Chief Complaint  Patient presents with  . Foot Pain    Left foot, "back of heel pain" x 1 week. Pt states that "the pain gradually got worse since last week."  Pt states that she went to a podiatrist in [place] on 04/01/15 and had a ultrasound done of the foot and was given an injection but she doesn't know what the injection was.         Review of Systems     Objective:   Physical Exam        Assessment & Plan:

## 2016-04-05 NOTE — Patient Instructions (Signed)

## 2016-04-07 ENCOUNTER — Ambulatory Visit
Admission: RE | Admit: 2016-04-07 | Discharge: 2016-04-07 | Disposition: A | Discharge status: Home / Self Care | Payer: Medicare Other | Source: Ambulatory Visit | Attending: Orthopedic Surgery | Admitting: Orthopedic Surgery

## 2016-04-07 DIAGNOSIS — M5137 Other intervertebral disc degeneration, lumbosacral region: Secondary | ICD-10-CM

## 2016-04-20 ENCOUNTER — Encounter: Payer: Self-pay | Admitting: Podiatry

## 2016-04-20 ENCOUNTER — Ambulatory Visit (INDEPENDENT_AMBULATORY_CARE_PROVIDER_SITE_OTHER): Payer: Medicare Other | Admitting: Podiatry

## 2016-04-20 DIAGNOSIS — M722 Plantar fascial fibromatosis: Secondary | ICD-10-CM

## 2016-04-20 MED ORDER — TRIAMCINOLONE ACETONIDE 10 MG/ML IJ SUSP
10.0000 mg | Freq: Once | INTRAMUSCULAR | Status: AC
Start: 2016-04-20 — End: 2016-04-20
  Administered 2016-04-20: 10 mg

## 2016-04-20 NOTE — Progress Notes (Signed)
Subjective:     Patient ID: Anita Pollard, female   DOB: Nov 23, 1940, 76 y.o.   MRN: [id number]  HPI patient having a lot of pain in the bottom of my left heel still and I admit I've not been wearing my brace   Review of Systems     Objective:   Physical Exam Neurovascular status intact with discomfort plantar heel left at the insertional point tendon into the calcaneus with fluid buildup still noted upon deep palpation    Assessment:     Plantar fasciitis left acute nature    Plan:     Advised on physical therapy anti-inflammatory and reinjected the plantar fascia 3 mg Kenalog 5 mill grams Xylocaine and instructed on night splint usage

## 2016-05-17 ENCOUNTER — Ambulatory Visit (INDEPENDENT_AMBULATORY_CARE_PROVIDER_SITE_OTHER): Payer: Medicare Other | Admitting: Podiatry

## 2016-05-17 DIAGNOSIS — M722 Plantar fascial fibromatosis: Secondary | ICD-10-CM | POA: Diagnosis not present

## 2016-05-17 DIAGNOSIS — M779 Enthesopathy, unspecified: Secondary | ICD-10-CM

## 2016-05-17 DIAGNOSIS — G629 Polyneuropathy, unspecified: Secondary | ICD-10-CM

## 2016-05-17 MED ORDER — TRIAMCINOLONE ACETONIDE 10 MG/ML IJ SUSP
10.0000 mg | Freq: Once | INTRAMUSCULAR | Status: AC
Start: 2016-05-17 — End: 2016-05-17
  Administered 2016-05-17: 10 mg

## 2016-05-18 ENCOUNTER — Ambulatory Visit: Payer: Medicare Other | Admitting: Podiatry

## 2016-05-18 NOTE — Progress Notes (Signed)
Subjective:     Patient ID: Anita Pollard, female   DOB: [birth year], 76 y.o.   MRN: [id number]  HPI patient states my heel is doing a lot better but now I'm getting pain in the left ankle probably from walking differently   Review of Systems     Objective:   Physical Exam Neurovascular status intact negative Homans sign was noted with significant improvement of plantar heel pain left with pain in the subtalar joint left with inflammation fluid buildup.    Assessment:     2 distinct problems with plantar fasciitis left along with inflammatory capsulitis of the left subtalar joint    Plan:     Both problems discussed and for plantar fascia and recommended continued shoe gear modifications stretching exercises ice therapy. I did inject the sinus tarsi 3 mg Kenalog 5 mg Xylocaine which was tolerated well and patient will be seen back to recheck depending on symptoms

## 2016-05-19 ENCOUNTER — Telehealth: Payer: Self-pay | Admitting: *Deleted

## 2016-05-19 MED ORDER — NONFORMULARY OR COMPOUNDED ITEM: 2 refills | Status: DC

## 2016-05-19 NOTE — Telephone Encounter (Signed)
[name] states pt's insurance would not cover the Achilles Tendonitis Cream, some the Authorized Substitute Pain Cream was given, and pt asked if the Lidocaine could be increased. [name] states Lidocaine can be increased to 5%. Dr. Josephina Shih the increase. 05/19/2016-I informed Anita Pollard of Dr. Mellody Drown orders.

## 2016-06-05 ENCOUNTER — Other Ambulatory Visit: Payer: Self-pay | Admitting: Dermatology

## 2016-06-14 ENCOUNTER — Ambulatory Visit: Payer: Medicare Other | Admitting: Podiatry

## 2016-07-24 ENCOUNTER — Other Ambulatory Visit: Payer: Self-pay | Admitting: Obstetrics and Gynecology

## 2016-07-24 DIAGNOSIS — Z853 Personal history of malignant neoplasm of breast: Secondary | ICD-10-CM

## 2016-07-24 DIAGNOSIS — Z1231 Encounter for screening mammogram for malignant neoplasm of breast: Secondary | ICD-10-CM

## 2016-08-16 ENCOUNTER — Encounter: Payer: Self-pay | Admitting: Podiatry

## 2016-08-16 ENCOUNTER — Ambulatory Visit (INDEPENDENT_AMBULATORY_CARE_PROVIDER_SITE_OTHER): Payer: Medicare Other | Admitting: Podiatry

## 2016-08-16 DIAGNOSIS — M779 Enthesopathy, unspecified: Secondary | ICD-10-CM

## 2016-08-16 DIAGNOSIS — M722 Plantar fascial fibromatosis: Secondary | ICD-10-CM | POA: Diagnosis not present

## 2016-08-16 MED ORDER — TRIAMCINOLONE ACETONIDE 10 MG/ML IJ SUSP
10.0000 mg | Freq: Once | INTRAMUSCULAR | Status: AC
  Administered 2016-08-16: 10 mg

## 2016-08-19 NOTE — Progress Notes (Signed)
Subjective:    Patient ID: Anita Pollard, female   DOB: 76 y.o.   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

## 2016-09-04 ENCOUNTER — Ambulatory Visit (INDEPENDENT_AMBULATORY_CARE_PROVIDER_SITE_OTHER): Payer: Medicare Other | Admitting: Podiatry

## 2016-09-04 DIAGNOSIS — M722 Plantar fascial fibromatosis: Secondary | ICD-10-CM

## 2016-09-04 DIAGNOSIS — M7752 Other enthesopathy of left foot: Secondary | ICD-10-CM

## 2016-09-04 DIAGNOSIS — M779 Enthesopathy, unspecified: Secondary | ICD-10-CM

## 2016-09-04 MED ORDER — TRIAMCINOLONE ACETONIDE 10 MG/ML IJ SUSP
10.0000 mg | Freq: Once | INTRAMUSCULAR | Status: AC
  Administered 2016-09-04: 10 mg

## 2016-09-06 ENCOUNTER — Ambulatory Visit
Admission: RE | Admit: 2016-09-06 | Discharge: 2016-09-06 | Disposition: A | Discharge status: Home / Self Care | Payer: Medicare Other | Source: Ambulatory Visit | Attending: Obstetrics and Gynecology | Admitting: Obstetrics and Gynecology

## 2016-09-06 DIAGNOSIS — Z853 Personal history of malignant neoplasm of breast: Secondary | ICD-10-CM

## 2016-09-06 DIAGNOSIS — Z1231 Encounter for screening mammogram for malignant neoplasm of breast: Secondary | ICD-10-CM

## 2016-09-06 HISTORY — DX: Malignant neoplasm of unspecified site of unspecified female breast: C50.919

## 2016-09-06 NOTE — Progress Notes (Signed)
Subjective:    Patient ID: Anita Pollard, female   DOB: 76 y.o.   MRN: [id number]   HPI patient presents stating she's getting pain in the top of the left foot and still has heel pain that's improving but is still present    ROS      Objective:  Physical Exam neurovascular status intact with patient noted to have discomfort in the lateral ankle tendon complex left and minimal discomfort in the plantar heel region bilateral with diminished inflammation fluid buildup noted     Assessment:    Doing well post fascia with no indications of current pathology and is noted to have tendinitis of the lateral tendon complex     Plan:    H&P conditions reviewed and at this point I did explain the continuation of conservative treatment for plantar fasciitis consisting of shoe gear modifications anti-inflammatories and support therapy. I did do a careful lateral injection left 3 mg Kenalog 5 mg Xylocaine and advised on physical therapy

## 2016-09-07 ENCOUNTER — Encounter (HOSPITAL_COMMUNITY): Payer: Self-pay | Admitting: Emergency Medicine

## 2016-09-07 ENCOUNTER — Emergency Department (HOSPITAL_COMMUNITY)
Admission: EM | Admit: 2016-09-07 | Discharge: 2016-09-07 | Disposition: A | Discharge status: Home / Self Care | Payer: Medicare Other | Attending: Emergency Medicine | Admitting: Emergency Medicine

## 2016-09-07 DIAGNOSIS — S81811A Laceration without foreign body, right lower leg, initial encounter: Secondary | ICD-10-CM | POA: Insufficient documentation

## 2016-09-07 DIAGNOSIS — W228XXA Striking against or struck by other objects, initial encounter: Secondary | ICD-10-CM | POA: Insufficient documentation

## 2016-09-07 DIAGNOSIS — Y939 Activity, unspecified: Secondary | ICD-10-CM | POA: Insufficient documentation

## 2016-09-07 DIAGNOSIS — Z853 Personal history of malignant neoplasm of breast: Secondary | ICD-10-CM | POA: Insufficient documentation

## 2016-09-07 DIAGNOSIS — Y999 Unspecified external cause status: Secondary | ICD-10-CM | POA: Diagnosis not present

## 2016-09-07 DIAGNOSIS — E039 Hypothyroidism, unspecified: Secondary | ICD-10-CM | POA: Insufficient documentation

## 2016-09-07 DIAGNOSIS — Z7984 Long term (current) use of oral hypoglycemic drugs: Secondary | ICD-10-CM | POA: Insufficient documentation

## 2016-09-07 DIAGNOSIS — E119 Type 2 diabetes mellitus without complications: Secondary | ICD-10-CM | POA: Insufficient documentation

## 2016-09-07 DIAGNOSIS — I10 Essential (primary) hypertension: Secondary | ICD-10-CM | POA: Diagnosis not present

## 2016-09-07 DIAGNOSIS — Z7982 Long term (current) use of aspirin: Secondary | ICD-10-CM | POA: Insufficient documentation

## 2016-09-07 DIAGNOSIS — S8011XA Contusion of right lower leg, initial encounter: Secondary | ICD-10-CM

## 2016-09-07 DIAGNOSIS — Z87891 Personal history of nicotine dependence: Secondary | ICD-10-CM | POA: Diagnosis not present

## 2016-09-07 DIAGNOSIS — Y929 Unspecified place or not applicable: Secondary | ICD-10-CM | POA: Insufficient documentation

## 2016-09-07 NOTE — ED Triage Notes (Signed)
Patient states that last night around 530pm she was unloading groceries and frozen water bottle fell out of freezer, bounced and hit patient's right shin.  Patient reports causing approx 1 laceration on right shin. Patient had trouble getting bleeding to stop. Went to Urgent care who re-wrapped leg and sent patient to ED. Patient denies taking blood thinners but is on prednisone due to kidney transplant.

## 2016-09-07 NOTE — Discharge Instructions (Signed)
Keep the laceration clean and dry. Steri-Strips should fall off on their own, if  fall off to early and reapply Steri-Strips to keep your wound closed. Continue to apply pressure dressing if any bleeding. Follow-up with family doctor as needed

## 2016-09-07 NOTE — ED Provider Notes (Signed)
[facility] DEPT Provider Note   CSN: [id number] Arrival date & time: 09/07/16  1150     History   Chief Complaint Chief Complaint  Patient presents with  . laceration on leg    HPI Anita Pollard is a 76 y.o. female.  HPI Anita Pollard is a 76 y.o. female with history of diabetes, hypertension, kidney transplant, presents to emergency department complaining of laceration to the right lower leg. Patient states she dropped a frozen bottle on the ground yesterday and it bounced off and hit her in the right shin. She had bruising and a small laceration to the shin. This happened approximately 5:30 last night, about 20 hours ago. She reports significant bleeding. She is able to control the bleeding with pressure dressing. Today when she went to urgent care they sent her here further evaluation. She states she believes her tetanus is up-to-date, since she had kidney transplant 2 years ago. She denies taking any blood thinners. She has no other complaints and she is ambulatory in the leg and denies any bony tenderness.  Past Medical History:  Diagnosis Date  . Anemia   . Arthritis   . Breast cancer ([facility]) 2010   Right  . Diabetes mellitus   . GERD (gastroesophageal reflux disease)   . HTN (hypertension)   . Hypothyroidism     Patient Active Problem List   Diagnosis Date Noted  . Pelvic mass in female 06/05/2013  . DM2 (diabetes mellitus, type 2) ([facility]) 09/24/2012  . AKI (acute kidney injury) ([facility]) 09/24/2012  . Elevated serum creatinine 09/03/2012  . Anemia, unspecified 09/03/2012  . Breast cancer ([facility]) 08/31/2011  . HYPERTENSION, BENIGN 02/28/2010  . CAROTID ARTERY DISEASE 01/26/2010    Past Surgical History:  Procedure Laterality Date  . ABDOMINAL HYSTERECTOMY    . ABDOMINOPLASTY    . APPENDECTOMY    . BREAST BIOPSY Right 08/03/2008   Stereo Bx, Malignant  . BREAST LUMPECTOMY Right 08/26/2008  . COSMETIC SURGERY    . ductal carcinoma     right breast   .  INSERTION OF DIALYSIS CATHETER Right 09/25/2012   Procedure: INSERTION OF DIALYSIS CATHETER;  Surgeon: Mal Misty, MD;  Location: [facility];  Service: Vascular;  Laterality: Right;  [facility]igh Internal Jugular Placement  . 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 THYROIDECTOMY    . TUMMY TUCK      OB History    No data available       Home Medications    Prior to Admission medications   Medication Sig Start Date End Date Taking? Authorizing Provider  amLODipine (NORVASC) 5 MG tablet Take 5 mg by mouth. 01/10/13  Yes [provider]  NONFORMULARY OR COMPOUNDED [place] compound: Achilles Tendonitis Cream - Diclofenac 3%, Baclofen 2%, Bupivacaine 1% Gabapentin 6%, Ibuprofen 3%, Pentoxifylline 3% dispensed 120 grams apply 1-2 grams to affected area 3-4 times daily, +2 refills. 02/05/15  Yes Wallene Huh, DPM  aspirin EC 81 MG tablet Take 81 mg by mouth. 10/02/13   [provider]  Cholecalciferol (VITAMIN D3) 5000 UNITS TABS Take 1 tablet by mouth daily.    [provider]  doxycycline (DORYX) 100 MG EC tablet Take 100 mg by mouth 2 (two) times daily.    [provider]  glipiZIDE (GLUCOTROL XL) 2.5 MG 24 hr tablet Take 5 mg by mouth. 01/01/14 01/01/15  [provider]  Glucosamine-Chondroit-Vit C-Mn (GLUCOSAMINE 1500 COMPLEX) CAPS Take by mouth 3 (three) times daily.  [provider]  Lancets (FREESTYLE) lancets by Does not apply route.    [provider]  losartan (COZAAR) 25 MG tablet Take 25 mg by mouth daily. 07/09/16   [provider]  magnesium oxide (MAGOX 400) 400 (241.3 MG) MG tablet Take 400 mg by mouth 3 (three) times daily. Pt takes a total of 800 mg every day 09/08/14   [provider]  Multiple Vitamin (MULTIVITAMIN) tablet Take 1 tablet by mouth daily.    [provider]  mycophenolate (MYFORTIC) 180 MG EC tablet Take 180 mg by mouth 2 (two) times daily. Pt takes 2 tablets twice a  day to equal 4 tablets a day 05/25/14   [provider]  NONFORMULARY OR COMPOUNDED [employer]: Authorized Substitute Pain Cream - Ibuprofen 15%, Baclofen 1%, Gabapentin 3%, and Lidocaine increased to 5% per Dr. Paulla Dolly. Patient not taking: Reported on 09/07/2016 05/19/16   Wallene Huh, DPM  omeprazole (PRILOSEC) 20 MG capsule Take 20 mg by mouth daily.    [provider]  potassium chloride (MICRO-K) 10 MEQ CR capsule Take 30 mEq by mouth 3 (three) times daily.  01/21/16   [provider]  pramipexole (MIRAPEX) 0.125 MG tablet Take 0.125 mg by mouth. 10/16/13   [provider]  predniSONE (DELTASONE) 5 MG tablet Take 5 mg by mouth daily at 12 noon.     [provider]  Probiotic Product (PROBIOTIC DAILY) CAPS Take one capsule every other day    [provider]  raloxifene (EVISTA) 60 MG tablet Take 60 mg by mouth daily.     [provider]  SYNTHROID 175 MCG tablet  10/03/14   [provider]  tacrolimus (PROGRAF) 1 MG capsule Pt takes 2 tablets in the morning and 1 tablet in the evening 10/02/14   [provider]  temazepam (RESTORIL) 15 MG capsule as needed.  05/20/13   [provider]    Family History Family History  Problem Relation Age of Onset  . Thyroid cancer Daughter 26  . Diabetes Father   . Diabetes Brother     Social History Social History  Substance Use Topics  . Smoking status: Former Smoker    Quit date: 09/25/1983  . Smokeless tobacco: Never Used  . 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  Skin: Positive for color change and wound.  Neurological: Negative for weakness and numbness.  All other systems reviewed and are  negative.    Physical Exam Updated Vital Signs BP (!) 153/70 (BP Location: Left Arm)   Pulse 67   Temp 97.6 F (36.4 C) (Oral)   Resp 18   Ht 5\' 1"  (1.549 m)   Wt 58.5 kg (129 lb)   SpO2 100%   BMI 24.37 kg/m   Physical Exam  Constitutional: She appears well-developed and well-nourished. No distress.  Eyes: Conjunctivae are normal.  Neck: Neck supple.  Musculoskeletal:  4 x 4 synovator hematoma to the right anterior lower shin with small, approximately 1 cm linear non-gaping skin tear. Hemostatic. No bony tenderness to the shin.  Neurological: She is alert.  Skin: Skin is warm and dry.  Nursing note and vitals reviewed.    ED Treatments / Results  Labs (all labs  ordered are listed, but only abnormal results are displayed) Labs Reviewed - No data to display  EKG  EKG Interpretation None       Radiology Mm Screening Breast Tomo Bilateral  Result Date: 09/06/2016 CLINICAL DATA:  Screening. EXAM: 2D DIGITAL SCREENING BILATERAL MAMMOGRAM WITH CAD AND ADJUNCT TOMO COMPARISON:  Previous exam(s). ACR Breast Density Category c: The breast tissue is heterogeneously dense, which may obscure small masses. FINDINGS: There are no findings suspicious for malignancy. Images were processed with CAD. IMPRESSION: No mammographic evidence of malignancy. A result letter of this screening mammogram will be mailed directly to the patient. RECOMMENDATION: Screening mammogram in one year. (Code:SM-B-01Y) BI-RADS CATEGORY  1: Negative. Electronically Signed   By: Lillia Mountain M.D.   On: 09/06/2016 16:38    Procedures Procedures (including critical care time)  LACERATION REPAIR Performed by: Jeannett Senior A Authorized by: Jeannett Senior A Consent: Verbal consent obtained. Risks and benefits: risks, benefits and alternatives were discussed Consent given by: patient Patient identity confirmed: provided demographic data Prepped and Draped in normal sterile fashion Wound  explored  Laceration Location: right anterior lower leg  Laceration Length: 1cm  No Foreign Bodies seen or palpated  Anesthesia: none Irrigation method: syringe Amount of cleaning: standard  Skin closure: sterri strip  Number of sutures: 3   Patient tolerance: Patient tolerated the procedure well with no immediate complications.  Medications Ordered in ED Medications - No data to display   Initial Impression / Assessment and Plan / ED Course  I have reviewed the triage vital signs and the nursing notes.  Pertinent labs & imaging results that were available during my care of the patient were reviewed by me and considered in my medical decision making (see chart for details).     Patient with hematoma and a small skin tear to the right anterior lower shin. Wound greater than 12 hours old. The laceration is very small, it is non-gaping, hemostatic, do not think it needs further workup or repair. Discussed with Dr. Laverta Baltimore who has seen patient and agrees. We'll place a Steri-Strip. Follow-up with family doctor as needed. Advised to ice, compression dressing as needed, elevate  Vitals:   09/07/16 1206 09/07/16 1207  BP: (!) 153/70   Pulse: 67   Resp: 18   Temp: 97.6 F (36.4 C)   TempSrc: Oral   SpO2: 100%   Weight:  58.5 kg (129 lb)  Height:  5\' 1"  (1.549 m)     Final Clinical Impressions(s) / ED Diagnoses   Final diagnoses:  Laceration of right lower leg, initial encounter  Traumatic hematoma of right lower leg, initial encounter    New Prescriptions Discharge Medication List as of 09/07/2016  1:13 PM       Jeannett Senior, PA-C 09/07/16 1530    Long, [facility], MD 09/07/16 1948

## 2016-09-13 ENCOUNTER — Telehealth: Payer: Self-pay | Admitting: *Deleted

## 2016-09-13 NOTE — Telephone Encounter (Signed)
Pt states both of her heels are so painful when walking. I offered pt an earlier appt and she states she was in last week and doesn't know if there is any thing Dr. Paulla Dolly can do. I told her to try ice 3-4 times a day for 15-20 minutes per session protecting skin from the ice pack with a cloth, and if needed call for an earlier appt.

## 2016-09-15 ENCOUNTER — Ambulatory Visit (INDEPENDENT_AMBULATORY_CARE_PROVIDER_SITE_OTHER): Payer: Medicare Other | Admitting: Podiatry

## 2016-09-15 ENCOUNTER — Encounter: Payer: Self-pay | Admitting: Podiatry

## 2016-09-15 DIAGNOSIS — M722 Plantar fascial fibromatosis: Secondary | ICD-10-CM

## 2016-09-15 MED ORDER — TRIAMCINOLONE ACETONIDE 10 MG/ML IJ SUSP
10.0000 mg | Freq: Once | INTRAMUSCULAR | Status: AC
  Administered 2016-09-15: 10 mg

## 2016-09-18 NOTE — Progress Notes (Signed)
Subjective:    Patient ID: Anita Pollard, female   DOB: 76 y.o.   MRN: [id number]   HPI patient states both of her heels have been extremely sore and she is due to go on a trip    ROS      Objective:  Physical Exam neurovascular status intact with exquisite discomfort noted plantar fascial band bilateral heels with inflammation fluid noted     Assessment:    Acute plantar fasciitis bilateral     Plan:    H&P conditions reviewed and at this point I did reinject the plantar fascia bilateral 3 mg Kenalog 5 mg Xylocaine and advised on physical therapy anti-inflammatories

## 2016-09-22 ENCOUNTER — Ambulatory Visit: Payer: Medicare Other | Admitting: Podiatry

## 2016-09-22 ENCOUNTER — Encounter: Payer: Self-pay | Admitting: Podiatry

## 2016-09-22 ENCOUNTER — Ambulatory Visit (INDEPENDENT_AMBULATORY_CARE_PROVIDER_SITE_OTHER): Payer: Medicare Other | Admitting: Podiatry

## 2016-09-22 DIAGNOSIS — M722 Plantar fascial fibromatosis: Secondary | ICD-10-CM

## 2016-09-22 DIAGNOSIS — M779 Enthesopathy, unspecified: Secondary | ICD-10-CM

## 2016-09-22 MED ORDER — TRIAMCINOLONE ACETONIDE 10 MG/ML IJ SUSP
10.0000 mg | Freq: Once | INTRAMUSCULAR | Status: AC
Start: 2016-09-22 — End: 2016-09-22
  Administered 2016-09-22: 10 mg

## 2016-09-22 NOTE — Progress Notes (Signed)
Subjective:    Patient ID: Anita Pollard, female   DOB: 76 y.o.   MRN: [id number]   HPI patient states the heels doing real well but she's getting pain in the outside of the right foot    ROS      Objective:  Physical Exam neurovascular status intact with inflammation pain in the outside of the right foot sinus tarsi with heel pain that's improved    Assessment:  Fasciitis symptoms improved with sinus tarsitis right      Plan:    Explained laying condition the patient and at this time sinus tarsi injection administered right 3 Mill grams Kenalog 5 g Xylocaine and discussed plantar fascia with continuation of stretching exercises supportive shoe gear and anti-inflammatories

## 2016-10-27 ENCOUNTER — Ambulatory Visit (INDEPENDENT_AMBULATORY_CARE_PROVIDER_SITE_OTHER): Payer: Medicare Other | Admitting: Podiatry

## 2016-10-27 ENCOUNTER — Encounter: Payer: Self-pay | Admitting: Podiatry

## 2016-10-27 DIAGNOSIS — M779 Enthesopathy, unspecified: Secondary | ICD-10-CM

## 2016-10-27 MED ORDER — TRIAMCINOLONE ACETONIDE 10 MG/ML IJ SUSP
10.0000 mg | Freq: Once | INTRAMUSCULAR | Status: AC
Start: 2016-10-27 — End: 2016-10-27
  Administered 2016-10-27: 10 mg

## 2016-10-30 NOTE — Progress Notes (Signed)
Subjective:    Patient ID: Anita Pollard, female   DOB: 76 y.o.   MRN: [id number]   HPI patient's states that she's developed pain down her left arch and the heel is improved and this is a new area    ROS      Objective:  Physical Exam neurovascular status intact with inflammation pain around the left mid arch area that's painful when pressed     Assessment:     Fasciitis-like symptoms    Plan:   Since it's a new area I did reinject this area 3 Mill grams Kenalog 5 mill grams Xylocaine and hopefully distal median of the patient's problems as everything else is resolved. Reappoint to recheck

## 2016-11-01 ENCOUNTER — Telehealth: Payer: Self-pay | Admitting: Podiatry

## 2016-11-01 NOTE — Telephone Encounter (Signed)
I just saw Dr. Paulla Dolly last week for my plantar fasciitis. He told me to wear the boot and not the orthotic shoe I'm assuming is the squared toe shoe. My question is I have a night splint so I didn't know if that is the boot he wants me to wear or the big black boot that you have to put air into.

## 2016-11-02 ENCOUNTER — Telehealth: Payer: Self-pay | Admitting: Podiatry

## 2016-11-02 NOTE — Telephone Encounter (Signed)
I spoke with pt, instructed her to wear the night splint at night and the cam walking boot as much as possible to immobilize the tendon, and decrease possible continued injury or overuse, continue ice therapy.

## 2016-11-02 NOTE — Telephone Encounter (Signed)
Left message informing pt she should wear the boot until her return appt with Dr. Paulla Dolly, and wear each boot as much as possible while on her trip.

## 2016-11-02 NOTE — Telephone Encounter (Signed)
This message is for [facility]. I spoke to you yesterday about wearing boots that Dr. Paulla Dolly had ordered. I wondered how long Dr. Paulla Dolly thinks I will have to be wearing these boots. He didn't give me any idea of how long it would have to be. You can reach me at [phone]. Thanks [employer].

## 2016-11-20 ENCOUNTER — Other Ambulatory Visit: Payer: Self-pay | Admitting: Podiatry

## 2016-11-20 ENCOUNTER — Encounter: Payer: Self-pay | Admitting: Podiatry

## 2016-11-20 ENCOUNTER — Ambulatory Visit (INDEPENDENT_AMBULATORY_CARE_PROVIDER_SITE_OTHER): Payer: Medicare Other | Admitting: Podiatry

## 2016-11-20 ENCOUNTER — Ambulatory Visit (INDEPENDENT_AMBULATORY_CARE_PROVIDER_SITE_OTHER): Payer: Medicare Other

## 2016-11-20 DIAGNOSIS — M779 Enthesopathy, unspecified: Secondary | ICD-10-CM

## 2016-11-20 DIAGNOSIS — M25571 Pain in right ankle and joints of right foot: Secondary | ICD-10-CM

## 2016-11-20 MED ORDER — TRIAMCINOLONE ACETONIDE 10 MG/ML IJ SUSP
10.0000 mg | Freq: Once | INTRAMUSCULAR | Status: AC
Start: 2016-11-20 — End: 2016-11-20
  Administered 2016-11-20: 10 mg

## 2016-11-21 NOTE — Progress Notes (Signed)
Subjective:    Patient ID: Anita Pollard, female   DOB: 76 y.o.   MRN: [id number]   HPI patient states her left seems to be feeling quite a bit better but the right subtalar joint has become very painful    ROS      Objective:  Physical Exam neurovascular status intact with patient continuing to have different areas of pain with improvement in certain areas followed by a current in other areas     Assessment:    Appears to be acute inflammation of the subtalar joint right with improvement of the fascial band left and subtalar joint left     Plan:    Since she's done so well with previous injections I did inject the right sinus tarsi 3 mg Kenalog 5 mill grams Xylocaine but explained we cannot keep doing this and I'm hoping this will be the end of her painful

## 2016-12-11 ENCOUNTER — Encounter: Payer: Self-pay | Admitting: Podiatry

## 2016-12-11 ENCOUNTER — Ambulatory Visit (INDEPENDENT_AMBULATORY_CARE_PROVIDER_SITE_OTHER): Payer: Medicare Other | Admitting: Podiatry

## 2016-12-11 DIAGNOSIS — S93601A Unspecified sprain of right foot, initial encounter: Secondary | ICD-10-CM

## 2016-12-11 DIAGNOSIS — M779 Enthesopathy, unspecified: Secondary | ICD-10-CM | POA: Diagnosis not present

## 2016-12-12 NOTE — Progress Notes (Signed)
Subjective:    Patient ID: Anita Pollard, female   DOB: 76 y.o.   MRN: [id number]   HPI patient continues to complain of pain in the right ankle stating that it seems to gradually be more irritated    ROS      Objective:  Physical Exam neurovascular status intact with patient found to have moderate edema in the lateral foot with continued pain sinus tarsi but most the pain is in the lateral ankle gutter right with diminished range of motion     Assessment:    Probability for inflammatory condition with arthritic processes and chronic swelling of the lateral ankle right     Plan:   Reviewed condition at great length the frustration of the continued pain and at this point I am going to send the patient for physical therapy and hopeful reduced edema activity and reduce discomfort. Will reappoint to me in 3-4 weeks for review

## 2016-12-13 ENCOUNTER — Telehealth: Payer: Self-pay | Admitting: *Deleted

## 2016-12-13 DIAGNOSIS — M25571 Pain in right ankle and joints of right foot: Principal | ICD-10-CM

## 2016-12-13 DIAGNOSIS — R609 Edema, unspecified: Secondary | ICD-10-CM

## 2016-12-13 DIAGNOSIS — G8929 Other chronic pain: Secondary | ICD-10-CM

## 2016-12-13 NOTE — Telephone Encounter (Addendum)
Pt request referral to [facility], PT. 12/14/2016-Order faxed to [facility]. I informed pt.

## 2016-12-14 NOTE — Telephone Encounter (Signed)
Chronic ankle pain right with edema. Needs p.t. To reduce edema and reduce pain inflammation of right lateral ankle

## 2017-01-01 ENCOUNTER — Ambulatory Visit: Payer: Medicare Other | Admitting: Podiatry

## 2017-03-13 ENCOUNTER — Other Ambulatory Visit: Payer: Self-pay | Admitting: Dermatology

## 2017-04-19 ENCOUNTER — Encounter: Payer: Self-pay | Admitting: Podiatry

## 2017-04-19 ENCOUNTER — Ambulatory Visit (INDEPENDENT_AMBULATORY_CARE_PROVIDER_SITE_OTHER): Payer: Medicare Other | Admitting: Podiatry

## 2017-04-19 DIAGNOSIS — M779 Enthesopathy, unspecified: Secondary | ICD-10-CM

## 2017-04-19 DIAGNOSIS — L84 Corns and callosities: Secondary | ICD-10-CM

## 2017-04-19 MED ORDER — TRIAMCINOLONE ACETONIDE 10 MG/ML IJ SUSP
10.0000 mg | Freq: Once | INTRAMUSCULAR | Status: AC
  Administered 2017-04-19: 10 mg

## 2017-04-19 NOTE — Progress Notes (Signed)
Subjective:   Patient ID: Anita Pollard, female   DOB: 77 y.o.   MRN: [id number]   HPI Patient's left second MPJ shows keratotic lesion and there is inflammation around the second third MPJs left   ROS      Objective:  Physical Exam  Inflammatory capsulitis second third MPJ with plantar keratotic lesion     Assessment:  Lesions secondary to pressure with capsulitis of the joint     Plan:  Careful injection of the MPJ accomplished 3 mg Kenalog 5 mg Xylocaine and debridement of lesion plantar accomplished left

## 2017-06-27 ENCOUNTER — Other Ambulatory Visit: Payer: Self-pay | Admitting: Dermatology

## 2017-07-25 ENCOUNTER — Other Ambulatory Visit: Payer: Self-pay | Admitting: Family Medicine

## 2017-07-25 DIAGNOSIS — Z139 Encounter for screening, unspecified: Secondary | ICD-10-CM

## 2017-07-25 DIAGNOSIS — M810 Age-related osteoporosis without current pathological fracture: Secondary | ICD-10-CM

## 2017-09-10 ENCOUNTER — Ambulatory Visit: Payer: Medicare Other

## 2017-09-10 ENCOUNTER — Other Ambulatory Visit: Payer: Medicare Other

## 2017-10-18 ENCOUNTER — Ambulatory Visit
Admission: RE | Admit: 2017-10-18 | Discharge: 2017-10-18 | Disposition: A | Discharge status: Home / Self Care | Payer: Medicare Other | Source: Ambulatory Visit | Attending: Family Medicine | Admitting: Family Medicine

## 2017-10-18 DIAGNOSIS — M810 Age-related osteoporosis without current pathological fracture: Secondary | ICD-10-CM

## 2017-10-18 DIAGNOSIS — Z139 Encounter for screening, unspecified: Secondary | ICD-10-CM

## 2017-12-21 ENCOUNTER — Ambulatory Visit (INDEPENDENT_AMBULATORY_CARE_PROVIDER_SITE_OTHER): Payer: Medicare Other | Admitting: Podiatry

## 2017-12-21 DIAGNOSIS — M779 Enthesopathy, unspecified: Secondary | ICD-10-CM

## 2017-12-21 DIAGNOSIS — G5761 Lesion of plantar nerve, right lower limb: Secondary | ICD-10-CM | POA: Diagnosis not present

## 2017-12-21 DIAGNOSIS — M7742 Metatarsalgia, left foot: Secondary | ICD-10-CM

## 2017-12-21 DIAGNOSIS — R29898 Other symptoms and signs involving the musculoskeletal system: Secondary | ICD-10-CM

## 2017-12-21 DIAGNOSIS — L909 Atrophic disorder of skin, unspecified: Secondary | ICD-10-CM

## 2017-12-21 DIAGNOSIS — M7741 Metatarsalgia, right foot: Secondary | ICD-10-CM

## 2017-12-21 NOTE — Progress Notes (Signed)
Subjective:  Patient ID: Anita Pollard, female    DOB: 10-21-1940,  MRN: [id number]  Chief Complaint  Patient presents with  . Toe Pain    Right foot 2nd toe pain, inflamed/red/swollen, "deformed" appearance, no known injury, started 2 weeks ago  . Foot Problem    Left foot callouses, pt requests removal    77 y.o. female presents with the above complaint. Reports pain in the right foot between the first and second toes. States the area was very swollen and red 2 weeks ago. Has been getting a little better but pain is still there.   Review of Systems: Negative except as noted in the HPI. Denies N/V/F/Ch.  Past Medical History:  Diagnosis Date  . Anemia   . Arthritis   . Breast cancer ([place]) 2010   Right  . Diabetes mellitus   . GERD (gastroesophageal reflux disease)   . HTN (hypertension)   . Hypothyroidism     Current Outpatient Medications:  .  amLODipine (NORVASC) 5 MG tablet, Take 5 mg by mouth., Disp: , Rfl:  .  aspirin EC 81 MG tablet, Take 81 mg by mouth., Disp: , Rfl:  .  Cholecalciferol (VITAMIN D3) 5000 UNITS TABS, Take 1 tablet by mouth 2 (two) times daily. , Disp: , Rfl:  .  glipiZIDE (GLUCOTROL XL) 2.5 MG 24 hr tablet, Take 5 mg by mouth., Disp: , Rfl:  .  Glucosamine-Chondroit-Vit C-Mn (GLUCOSAMINE 1500 COMPLEX) CAPS, Take by mouth 3 (three) times daily., Disp: , Rfl:  .  Lancets (FREESTYLE) lancets, by Does not apply route., Disp: , Rfl:  .  losartan (COZAAR) 25 MG tablet, Take 25 mg by mouth daily., Disp: , Rfl:  .  magnesium oxide (MAGOX 400) 400 (241.3 MG) MG tablet, Take 400 mg by mouth 3 (three) times daily. Pt takes a total of 800 mg every day, Disp: , Rfl:  .  Multiple Vitamin (MULTIVITAMIN) tablet, Take 1 tablet by mouth daily., Disp: , Rfl:  .  mycophenolate (MYFORTIC) 180 MG EC tablet, Take 180 mg by mouth 2 (two) times daily. Pt takes 2 tablets twice a day to equal 4 tablets a day, Disp: , Rfl:  .  NONFORMULARY OR COMPOUNDED ITEM, [facility]  compound: Achilles Tendonitis Cream - Diclofenac 3%, Baclofen 2%, Bupivacaine 1% Gabapentin 6%, Ibuprofen 3%, Pentoxifylline 3% dispensed 120 grams apply 1-2 grams to affected area 3-4 times daily, +2 refills. (Patient not taking: Reported on 12/21/2017), Disp: 480 each, Rfl: 11 .  NONFORMULARY OR COMPOUNDED ITEM, [facility]: Authorized Substitute Pain Cream - Ibuprofen 15%, Baclofen 1%, Gabapentin 3%, and Lidocaine increased to 5% per Dr. Paulla Dolly. (Patient not taking: Reported on 12/21/2017), Disp: 120 each, Rfl: 2 .  omeprazole (PRILOSEC) 20 MG capsule, Take 20 mg by mouth daily., Disp: , Rfl:  .  potassium chloride (MICRO-K) 10 MEQ CR capsule, Take 30 mEq by mouth 3 (three) times daily. , Disp: , Rfl:  .  pramipexole (MIRAPEX) 0.125 MG tablet, Take 0.125 mg by mouth., Disp: , Rfl:  .  predniSONE (DELTASONE) 5 MG tablet, Take 5 mg by mouth daily at 12 noon. , Disp: , Rfl:  .  Probiotic Product (PROBIOTIC DAILY) CAPS, Take one capsule every other day, Disp: , Rfl:  .  raloxifene (EVISTA) 60 MG tablet, Take 60 mg by mouth daily. , Disp: , Rfl:  .  SYNTHROID 175 MCG tablet, , Disp: , Rfl:  .  tacrolimus (PROGRAF) 1 MG capsule, Pt takes 1.5 in the morning  and 1.5 in the evening, Disp: , Rfl:  .  temazepam (RESTORIL) 15 MG capsule, as needed. , Disp: , Rfl:   Social History   Tobacco Use  Smoking Status Former Smoker  . Last attempt to quit: 09/25/1983  . Years since quitting: 34.2  Smokeless Tobacco Never Used    Allergies  Allergen Reactions  . Penicillins Itching and Swelling    Swelling and flushing Has patient had a PCN reaction causing immediate rash, facial/tongue/throat swelling, SOB or lightheadedness with hypotension: yes Has patient had a PCN reaction causing severe rash involving mucus membranes or skin necrosis: no Has patient had a PCN reaction that required hospitalization: no Has patient had a PCN reaction occurring within the last 10 years: no If all of the above answers  are "NO", then may proceed with Cephalosporin use.  . Banana Nausea Only    Green bananas   . Detrol [Tolterodine]     Leg cramps  . Quetiapine Other (See Comments)    Leg cramps/restless leg   Objective:  There were no vitals filed for this visit. There is no height or weight on file to calculate BMI. Constitutional Well developed. Well nourished.  Vascular Dorsalis pedis pulses palpable bilaterally. Posterior tibial pulses palpable bilaterally. Capillary refill normal to all digits.  No cyanosis or clubbing noted. Pedal hair growth normal.  Neurologic Normal speech. Oriented to person, place, and time. Epicritic sensation to light touch grossly present bilaterally.  Dermatologic Nails well groomed and normal in appearance. No open wounds. No skin lesions.  Orthopedic: Normal joint ROM without pain or crepitus bilaterally. No visible deformities. No bony tenderness. POP R 1st interspace   Radiographs: none today. Assessment:   1. Morton neuroma, right   2. Capsulitis   3. Metatarsalgia of both feet   4. Fat pad atrophy of foot    Plan:  Patient was evaluated and treated and all questions answered.  Morton's Neuroma 1st interspace right / capsulitis; could be secondary to gout -Injection delivered 1st interspace right as below. -Discussed f/u should pain recur.  Procedure: Neuroma Injection Location: Right 1st interspace Skin Prep: Alcohol. Injectate: 0.5 cc 0.5% marcaine plain, 0.5 cc dexamethasone phosphate. Disposition: Patient tolerated procedure well. Injection site dressed with a band-aid.   Return if symptoms worsen or fail to improve.

## 2017-12-29 ENCOUNTER — Other Ambulatory Visit: Payer: Self-pay

## 2017-12-29 ENCOUNTER — Encounter (HOSPITAL_COMMUNITY): Payer: Self-pay | Admitting: Emergency Medicine

## 2017-12-29 ENCOUNTER — Emergency Department (HOSPITAL_COMMUNITY)
Admission: EM | Admit: 2017-12-29 | Discharge: 2017-12-29 | Disposition: A | Discharge status: Home / Self Care | Payer: Medicare Other | Attending: Emergency Medicine | Admitting: Emergency Medicine

## 2017-12-29 ENCOUNTER — Emergency Department (HOSPITAL_COMMUNITY): Payer: Medicare Other

## 2017-12-29 DIAGNOSIS — Y9389 Activity, other specified: Secondary | ICD-10-CM | POA: Insufficient documentation

## 2017-12-29 DIAGNOSIS — Z79899 Other long term (current) drug therapy: Secondary | ICD-10-CM | POA: Insufficient documentation

## 2017-12-29 DIAGNOSIS — I2510 Atherosclerotic heart disease of native coronary artery without angina pectoris: Secondary | ICD-10-CM | POA: Insufficient documentation

## 2017-12-29 DIAGNOSIS — Y92810 Car as the place of occurrence of the external cause: Secondary | ICD-10-CM | POA: Diagnosis not present

## 2017-12-29 DIAGNOSIS — Z7984 Long term (current) use of oral hypoglycemic drugs: Secondary | ICD-10-CM | POA: Diagnosis not present

## 2017-12-29 DIAGNOSIS — Y999 Unspecified external cause status: Secondary | ICD-10-CM | POA: Diagnosis not present

## 2017-12-29 DIAGNOSIS — W268XXA Contact with other sharp object(s), not elsewhere classified, initial encounter: Secondary | ICD-10-CM | POA: Diagnosis not present

## 2017-12-29 DIAGNOSIS — E119 Type 2 diabetes mellitus without complications: Secondary | ICD-10-CM | POA: Insufficient documentation

## 2017-12-29 DIAGNOSIS — I10 Essential (primary) hypertension: Secondary | ICD-10-CM | POA: Diagnosis not present

## 2017-12-29 DIAGNOSIS — S81811A Laceration without foreign body, right lower leg, initial encounter: Secondary | ICD-10-CM

## 2017-12-29 DIAGNOSIS — E039 Hypothyroidism, unspecified: Secondary | ICD-10-CM | POA: Insufficient documentation

## 2017-12-29 DIAGNOSIS — Z7982 Long term (current) use of aspirin: Secondary | ICD-10-CM | POA: Insufficient documentation

## 2017-12-29 DIAGNOSIS — Z853 Personal history of malignant neoplasm of breast: Secondary | ICD-10-CM | POA: Insufficient documentation

## 2017-12-29 MED ORDER — LIDOCAINE-EPINEPHRINE-TETRACAINE (LET) SOLUTION
3.0000 mL | Freq: Once | NASAL | Status: AC
  Administered 2017-12-29: 3 mL via TOPICAL
  Filled 2017-12-29: qty 3

## 2017-12-29 MED ORDER — LIDOCAINE HCL (PF) 1 % IJ SOLN
20.0000 mL | Freq: Once | INTRAMUSCULAR | Status: DC
  Filled 2017-12-29 (×2): qty 30

## 2017-12-29 NOTE — ED Notes (Signed)
Bed: WA21 Expected date:  Expected time:  Means of arrival:  Comments: Leg lac

## 2017-12-29 NOTE — Discharge Instructions (Addendum)
Please see the information and instructions below regarding your visit.  Your diagnoses today include:  1. Skin tear of lower leg without complication, right, initial encounter   2. Elevated blood pressure reading in office with diagnosis of hypertension     Tests performed today include: X-ray of the affected area that did not show any foreign bodies or broken bones Vital signs. See below for your results today.   Medications prescribed:   Take any prescribed medications only as directed.  Tylenol 650 mg every 6 hours as needed for pain. Do not exceed 4000 mg in one day.  Home care instructions:  Follow any educational materials and wound care instructions contained in this packet.   Keep affected area above the level of your heart when possible to minimize swelling. Do not get wet. Change your dressing in 2 days.   Please refrain from soaking wound for long periods of time, or swimming in chlorinated water   You may apply antibiotic ointment such as Bacitracin or Neosporin.  Follow-up instructions: Return to the primary care care doctor in 5-7 days for a recheck of your wound.    Return instructions:  Return to the Emergency Department if you have: Fever Worsening pain Worsening swelling of the wound Pus draining from the wound Redness of the skin that moves away from the wound, especially if it streaks away from the affected area  Any other emergent concerns  Your vital signs today were: BP (!) 149/62    Pulse 73    Temp 97.6 F (36.4 C) (Oral)    Resp 16    Ht 5\' 1"  (1.549 m)    Wt 60.3 kg    SpO2 98%    BMI 25.13 kg/m  If your blood pressure (BP) was elevated on multiple readings during this visit above 130 for the top number or above 80 for the bottom number, please have this repeated by your primary care provider within one month. --------------  Thank you for allowing [place] to participate in your care today! It was a pleasure taking care of you.

## 2017-12-29 NOTE — ED Triage Notes (Signed)
Pt was at [facility] on [address]. Pt hit right shin on corner of the door. Able to see adipose tissue - wrapped with gauze by EMS. Bleeding had subsided prior to EMS arrival Pedal pulse +2

## 2017-12-29 NOTE — ED Provider Notes (Signed)
[facility] DEPT Provider Note   CSN: [id number] Arrival date & time: 12/29/17  1544     History   Chief Complaint Chief Complaint  Patient presents with  . Laceration    HPI Anita Pollard is a 77 y.o. female.  HPI  Patient is a 23-year female with a history of kidney transplant, diabetes mellitus, breast cancer, hypothyroidism, and hypertension presenting for laceration to the right lower extremity.  Patient reports that she was getting out of her car in a [place] parking lot, when she caught the side of her leg on the corner of the tour.  Patient reports that it hurt, but she did not think she lacerated until she saw blood trickling down the leg.  No syncope. Patient denies catching her leg in the door. Patient denies any loss of sensation distal to the injury.  Patient does not take blood thinning medications.  Patient reports that her last tetanus shot was within the last 2 to 3 years.  Past Medical History:  Diagnosis Date  . Anemia   . Arthritis   . Breast cancer ([place]) 2010   Right  . Diabetes mellitus   . GERD (gastroesophageal reflux disease)   . HTN (hypertension)   . Hypothyroidism     Patient Active Problem List   Diagnosis Date Noted  . Pelvic mass in female 06/05/2013  . DM2 (diabetes mellitus, type 2) ([place]) 09/24/2012  . AKI (acute kidney injury) ([place]) 09/24/2012  . Elevated serum creatinine 09/03/2012  . Anemia, unspecified 09/03/2012  . Breast cancer ([place]) 08/31/2011  . HYPERTENSION, BENIGN 02/28/2010  . CAROTID ARTERY DISEASE 01/26/2010    Past Surgical History:  Procedure Laterality Date  . ABDOMINAL HYSTERECTOMY    . ABDOMINOPLASTY    . APPENDECTOMY    . BREAST BIOPSY Right 08/03/2008   Stereo Bx, Malignant  . BREAST LUMPECTOMY Right 08/26/2008  . COSMETIC SURGERY    . ductal carcinoma     right breast   . INSERTION OF DIALYSIS CATHETER Right 09/25/2012   Procedure: INSERTION OF DIALYSIS CATHETER;  Surgeon:  Mal Misty, MD;  Location: [facility];  Service: Vascular;  Laterality: Right;  Righ Internal Jugular Placement  . 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 THYROIDECTOMY    . TUMMY TUCK       OB History   None      Home Medications    Prior to Admission medications   Medication Sig Start Date End Date Taking? Authorizing Provider  amLODipine (NORVASC) 5 MG tablet Take 5 mg by mouth. 01/10/13   [provider]  aspirin EC 81 MG tablet Take 81 mg by mouth. 10/02/13   [provider]  Cholecalciferol (VITAMIN D3) 5000 UNITS TABS Take 1 tablet by mouth 2 (two) times daily.     [provider]  glipiZIDE (GLUCOTROL XL) 2.5 MG 24 hr tablet Take 5 mg by mouth. 01/01/14 09/07/16  [provider]  Glucosamine-Chondroit-Vit C-Mn (GLUCOSAMINE 1500 COMPLEX) CAPS Take by mouth 3 (three) times daily.    [provider]  Lancets (FREESTYLE) lancets by Does not apply route.    [provider]  losartan (COZAAR) 25 MG tablet Take 25 mg by mouth daily. 07/09/16   [provider]  magnesium oxide (MAGOX 400) 400 (241.3 MG) MG tablet Take 400 mg by mouth 3 (three) times daily. Pt takes a total of 800 mg every day 09/08/14   [provider]  Multiple Vitamin (MULTIVITAMIN) tablet Take  1 tablet by mouth daily.    [provider]  mycophenolate (MYFORTIC) 180 MG EC tablet Take 180 mg by mouth 2 (two) times daily. Pt takes 2 tablets twice a day to equal 4 tablets a day 05/25/14   [provider]  NONFORMULARY OR COMPOUNDED [facility] compound: Achilles Tendonitis Cream - Diclofenac 3%, Baclofen 2%, Bupivacaine 1% Gabapentin 6%, Ibuprofen 3%, Pentoxifylline 3% dispensed 120 grams apply 1-2 grams to affected area 3-4 times daily, +2 refills. Patient not taking: Reported on 12/21/2017 02/05/15   Wallene Huh, DPM  NONFORMULARY OR COMPOUNDED ITEM [facility]: Authorized Substitute Pain Cream - Ibuprofen 15%,  Baclofen 1%, Gabapentin 3%, and Lidocaine increased to 5% per Dr. Paulla Dolly. Patient not taking: Reported on 12/21/2017 05/19/16   Wallene Huh, DPM  omeprazole (PRILOSEC) 20 MG capsule Take 20 mg by mouth daily.    [provider]  potassium chloride (MICRO-K) 10 MEQ CR capsule Take 30 mEq by mouth 3 (three) times daily.  01/21/16   [provider]  pramipexole (MIRAPEX) 0.125 MG tablet Take 0.125 mg by mouth. 10/16/13   [provider]  predniSONE (DELTASONE) 5 MG tablet Take 5 mg by mouth daily at 12 noon.     [provider]  Probiotic Product (PROBIOTIC DAILY) CAPS Take one capsule every other day    [provider]  raloxifene (EVISTA) 60 MG tablet Take 60 mg by mouth daily.     [provider]  SYNTHROID 175 MCG tablet  10/03/14   [provider]  tacrolimus (PROGRAF) 1 MG capsule Pt takes 1.5 in the morning and 1.5 in the evening 10/02/14   [provider]  temazepam (RESTORIL) 15 MG capsule as needed.  05/20/13   [provider]    Family History Family History  Problem Relation Age of Onset  . Thyroid cancer Daughter 38  . Diabetes Father   . Diabetes Brother     Social History Social History   Tobacco Use  . Smoking status: Former Smoker    Last attempt to quit: 09/25/1983    Years since quitting: 34.2  . Smokeless tobacco: Never Used  Substance Use Topics  . 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 Drug use: No     Allergies   Penicillins; Banana; Detrol [tolterodine]; and Quetiapine   Review of Systems Review of Systems  Skin: Positive for wound.  Neurological: Negative for weakness and numbness.     Physical Exam Updated Vital Signs BP (!) 174/66 (BP Location: Right Arm)   Pulse 78   Temp 97.6 F (36.4 C) (Oral)   Resp 16   Ht 5\' 1"  (1.549 m)   Wt 60.3 kg   SpO2 99%   BMI 25.13 kg/m    Physical Exam  Constitutional: She appears well-developed and well-nourished. No distress.  Sitting comfortably in bed.  HENT:  Head: Normocephalic and atraumatic.  Eyes: Conjunctivae are normal. Right eye exhibits no discharge. Left eye exhibits no discharge.  EOMs normal to gross examination.  Neck: Normal range of motion.  Cardiovascular: Normal rate and regular rhythm.  Intact, 2+ DP pulses.  Pulmonary/Chest:  Normal respiratory effort. Patient converses comfortably. No audible wheeze or stridor.  Abdominal: She exhibits no distension.  Musculoskeletal: Normal range of motion.  See clinical photo for details.  Patient has a 3.5 x 3.5 cm flap laceration of the  shin of the right lower extremity. Extends to adipose tissue.  Neurological: She is alert.  Cranial nerves intact to gross observation. Patient moves extremities without difficulty.  Skin: Skin is warm and dry. She is not diaphoretic.  Psychiatric: She has a normal mood and affect. Her behavior is normal. Judgment and thought content normal.  Nursing note and vitals reviewed.      ED Treatments / Results  Labs (all labs ordered are listed, but only abnormal results are displayed) Labs Reviewed - No data to display  EKG None  Radiology No results found.  Procedures .Marland KitchenLaceration Repair Date/Time: 12/29/2017 8:03 PM Performed by: Albesa Seen, PA-C Authorized by: Albesa Seen, PA-C   Consent:    Consent obtained:  Verbal   Consent given by:  Patient   Risks discussed:  Infection, pain, poor cosmetic result and need for additional repair Anesthesia (see MAR for exact dosages):    Anesthesia method:  Topical application   Topical anesthetic:  LET Laceration details:    Location:  Leg   Leg location:  R lower leg   Wound length (cm): 3.5 x 3.5 cm flaf. Repair type:    Repair type:  Simple Pre-procedure details:    Preparation:  Patient was prepped and draped in usual sterile fashion Exploration:     Wound exploration: wound explored through full range of motion     Contaminated: no   Treatment:    Area cleansed with:  Saline   Amount of cleaning:  Standard   Irrigation solution:  Sterile saline   Irrigation method:  Syringe Skin repair:    Repair method:  Steri-Strips   Number of Steri-Strips:  6 Approximation:    Approximation:  Close Post-procedure details:    Dressing:  Non-adherent dressing   Patient tolerance of procedure:  Tolerated well, no immediate complications   (including critical care time)  Medications Ordered in ED Medications - No data to display   Initial Impression / Assessment and Plan / ED Course  I have reviewed the triage vital signs and the nursing notes.  Pertinent labs & imaging results that were available during my care of the patient were reviewed by me and considered in my medical decision making (see chart for details).  Clinical Course as of Dec 29 2000  Sat Dec 29, 2017  1741 No radio-opaque foreign body.  DG Tibia/Fibula Right [AM]    Clinical Course User Index [AM] Albesa Seen, PA-C   Patient well-appearing and neurovascularly intact in the right lower extremity.  Patient with flap skin tear likely leading to wound dehiscence.  Skin of the flap is very thin, not amenable to primary closure with suture.  Steri-Strips were applied.  I did discuss with patient likelihood that the flap of skin would have dehiscence, and the importance of maintaining a clean and dry bandage.  Patient was also instructed to follow-up with her primary care provider for wound recheck.  This was a noncontaminated wound through the pant leg, and it was copiously cleansed, therefore no antibiotic prophylaxis is administered.  Return precautions were given for any increasing swelling, purulent drainage, erythema, and patient is in understanding and agrees with the plan of care.  Patient has significantly elevated blood pressure on arrival to emergency department  today.  It was lowered to the emergency department course.  Patient took antihypertensives today.  Patient is not experiencing any chest pain, shortness of breath, visual disturbance, headache.  Final Clinical Impressions(s) / ED Diagnoses  Final diagnoses:  Skin tear of lower leg without complication, right, initial encounter  Elevated blood pressure reading in office with diagnosis of hypertension    ED Discharge Orders    None       Tamala Julian 12/29/17 Steva Colder, MD 12/30/17 [phone]

## 2017-12-29 NOTE — ED Notes (Addendum)
LET gel applied

## 2018-02-13 ENCOUNTER — Encounter: Payer: Self-pay | Admitting: Cardiology

## 2018-04-02 ENCOUNTER — Encounter: Payer: Self-pay | Admitting: Cardiovascular Disease

## 2018-04-03 ENCOUNTER — Encounter

## 2018-04-03 ENCOUNTER — Encounter: Payer: Self-pay | Admitting: Cardiology

## 2018-04-03 ENCOUNTER — Ambulatory Visit (INDEPENDENT_AMBULATORY_CARE_PROVIDER_SITE_OTHER): Payer: Medicare Other | Admitting: Cardiology

## 2018-04-03 VITALS — BP 120/62 | HR 64 | Ht 61.0 in | Wt 133.4 lb

## 2018-04-03 DIAGNOSIS — E785 Hyperlipidemia, unspecified: Secondary | ICD-10-CM | POA: Diagnosis not present

## 2018-04-03 DIAGNOSIS — R011 Cardiac murmur, unspecified: Secondary | ICD-10-CM

## 2018-04-03 DIAGNOSIS — I739 Peripheral vascular disease, unspecified: Secondary | ICD-10-CM

## 2018-04-03 DIAGNOSIS — I779 Disorder of arteries and arterioles, unspecified: Secondary | ICD-10-CM

## 2018-04-03 MED ORDER — ROSUVASTATIN CALCIUM 5 MG PO TABS: 5.0000 mg | ORAL_TABLET | ORAL | 1 refills | Status: DC

## 2018-04-03 NOTE — Progress Notes (Signed)
Cardiology Office Note:    Date:  04/03/2018   ID:  Anita Pollard, DOB 10-10-1940, MRN [id number]  PCP:  Anita Cruel, MD  Cardiologist:  No primary care provider on file.  Electrophysiologist:  None   Referring MD: Anita Cruel, MD   Reason for referral: Murmur  History of Present Illness:    Anita Pollard is a 78 y.o. female with a hx of diabetes, hypertension, bilateral kidney transplant, 4 years ago, who was previously patient of Dr. Lia Pollard for palpitations and hypertension but not seen since 2014.  The patient was referred by Dr. Harrington Pollard for murmur.  The patient underwent stress testing prior to her kidney transplant that was normal.  She underwent transplant at [facility].  She denies any chest pain or shortness of breath, she has healthy weight, she remains minimally active as she has problem with arthritis.  She denies any dizziness palpitations or syncope.  No lower extremity edema.  Past Medical History:  Diagnosis Date  . Alcohol abuse   . Anemia   . Arthritis   . Breast cancer (Anita Pollard) 2010   Right  . Colon polyp   . Depression   . Diabetes mellitus   . GERD (gastroesophageal reflux disease)   . HTN (hypertension)   . Hypothyroidism   . Osteopenia   . Pancreatitis   . Ulcerative esophagitis    Past Surgical History:  Procedure Laterality Date  . ABDOMINAL HYSTERECTOMY    . ABDOMINOPLASTY    . APPENDECTOMY    . BREAST BIOPSY Right 08/03/2008   Stereo Bx, Malignant  . BREAST LUMPECTOMY Right 08/26/2008  . COSMETIC SURGERY    . ductal carcinoma     right breast   . INSERTION OF DIALYSIS CATHETER Right 09/25/2012   Procedure: INSERTION OF DIALYSIS CATHETER;  Surgeon: Mal Misty, MD;  Location: [place];  Service: Vascular;  Laterality: Right;  Righ Internal Jugular Placement  . 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 THYROIDECTOMY    . TUMMY TUCK      Current Medications: Current Meds  Medication Sig  . amLODipine (NORVASC) 5 MG tablet Take 10 mg  by mouth.   . Cholecalciferol (VITAMIN D3) 5000 UNITS TABS Take 1 tablet by mouth 3 (three) times daily.   Marland Kitchen glipiZIDE (GLUCOTROL XL) 2.5 MG 24 hr tablet Take 5 mg by mouth 4 (four) times daily.   . Glucosamine-Chondroit-Vit C-Mn (GLUCOSAMINE 1500 COMPLEX) CAPS Take by mouth 3 (three) times daily.  . Lancets (FREESTYLE) lancets by Does not apply route.  . lipase/protease/amylase (CREON) 12000 units CPEP capsule Creon 12,000-38,000-60,000 unit capsule,delayed release  TAKE 2 CAPSULES BY ORAL ROUTE 3 TIMES PER DAY WITH MEALS AND 1 CAPSULE WITH EACH SNACK  . losartan (COZAAR) 25 MG tablet Take 25 mg by mouth daily.  . magnesium oxide (MAGOX 400) 400 (241.3 MG) MG tablet Take 400 mg by mouth 3 (three) times daily. Pt takes a total of 800 mg every day  . Melatonin 5 MG CAPS Take by mouth.  . Multiple Vitamin (MULTIVITAMIN) tablet Take 1 tablet by mouth daily.  . mycophenolate (MYFORTIC) 180 MG EC tablet Take 180 mg by mouth 2 (two) times daily. Pt takes 2 tablets twice a day to equal 4 tablets a day  . niacinamide 500 MG tablet Take by mouth.  Marland Kitchen omeprazole (PRILOSEC) 20 MG capsule Take 20 mg by mouth daily.  . potassium chloride (MICRO-K) 10 MEQ CR capsule Take 30 mEq by mouth 3 (three) times  daily.   . pramipexole (MIRAPEX) 0.125 MG tablet Take 0.125 mg by mouth.  . predniSONE (DELTASONE) 5 MG tablet Take 5 mg by mouth daily at 12 noon.   . Probiotic Product (PROBIOTIC DAILY) CAPS Take one capsule every other day  . raloxifene (EVISTA) 60 MG tablet Take 60 mg by mouth daily.   Marland Kitchen sulfamethoxazole-trimethoprim (BACTRIM,SEPTRA) 400-80 MG tablet Take by mouth.  . SYNTHROID 175 MCG tablet   . tacrolimus (PROGRAF) 1 MG capsule Pt takes 1.5 in the morning and 1.5 in the evening  . temazepam (RESTORIL) 15 MG capsule as needed.      Allergies:   Penicillins; Banana; Detrol [tolterodine]; Quetiapine; Risperidone and related; and Lisinopril   Social History   Socioeconomic History  . Marital status:  Widowed    Spouse name: Not on file  . Number of children: Not on file  . Years of education: Not on file  . Highest education level: Not on file  Occupational History  . Not on file  Social Needs  . Financial resource strain: Not on file  . Food insecurity:    Worry: Not on file    Inability: Not on file  . Transportation needs:    Medical: Not on file    Non-medical: Not on file  Tobacco Use  . Smoking status: Former Smoker    Last attempt to quit: 09/25/1983    Years since quitting: 34.5  . Smokeless tobacco: Never Used  Substance and Sexual Activity  . 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 Drug use: No  . Sexual activity: Never  Lifestyle  . Physical activity:    Days per week: Not on file    Minutes per session: Not on file  . Stress: Not on file  Relationships  . Social connections:    Talks on phone: Not on file    Gets together: Not on file    Attends religious service: Not on file    Active member of club or organization: Not on file    Attends meetings of clubs or organizations: Not on file    Relationship status: Not on file  Other Topics Concern  . Not on file  Social History Narrative  . Not on file     Family History: The patient's family history includes Atrial fibrillation in her brother; Colon polyps in her brother; Dementia in her sister; Diabetes in her brother and father; Hypertension in her father, mother, and sister; Multiple myeloma in her sister; Thyroid cancer (age of onset: 33) in her daughter.  ROS:   Please see the history of present illness.    All other systems reviewed and are negative.  EKGs/Labs/Other Studies Reviewed:    The following studies were reviewed today:  EKG:  EKG is ordered today.  The ekg ordered today demonstrates normal sinus rhythm, normal EKG.  Personally reviewed.  Recent Labs: No results found for requested labs within last  8760 hours.  Recent Lipid Panel No results found for: CHOL, TRIG, HDL, CHOLHDL, VLDL, LDLCALC, LDLDIRECT  Physical Exam:    VS:  BP 120/62   Pulse 64   Ht '5\' 1"'  (1.549 m)   Wt 133 lb 6.4 oz (60.5 kg)   SpO2 98%   BMI 25.21 kg/m     Wt Readings from Last 3 Encounters:  04/03/18 133 lb 6.4 oz (60.5 kg)  12/29/17 133 lb (60.3  kg)  09/07/16 129 lb (58.5 kg)    Carotid ultrasound 11/17/2015  IMPRESSION: Minor carotid atherosclerosis. No hemodynamically significant ICA stenosis. Degree of narrowing less than 50% bilaterally. Patent antegrade vertebral flow bilaterally  Echocardiogram 09/30/2012   - Left ventricle: The cavity size was normal. Wall thickness was normal. Systolic function was vigorous. The estimated ejection fraction was in the range of 65% to 70%. Wall motion was normal; there were no regional wall motion abnormalities. Doppler parameters are consistent with abnormal left ventricular relaxation (grade 1 diastolic dysfunction). - Pulmonary arteries: PA peak pressure: 4m Hg (S).  GEN: Well nourished, well developed in no acute distress HEENT: Normal NECK: No JVD; No carotid bruits LYMPHATICS: No lymphadenopathy CARDIAC: RRR, 3 out of 6 systolic and 1 out of 6 diastolic murmurs, rubs, gallops RESPIRATORY:  Clear to auscultation without rales, wheezing or rhonchi  ABDOMEN: Soft, non-tender, non-distended MUSCULOSKELETAL:  No edema; No deformity  SKIN: Warm and dry NEUROLOGIC:  Alert and oriented x 3 PSYCHIATRIC:  Normal affect   ASSESSMENT:    1. Carotid artery disease, unspecified laterality, unspecified type ([place]   2. Murmur   3. Hyperlipidemia, unspecified hyperlipidemia type    PLAN:    In order of problems listed above:  1. Prior history of carotid disease, last carotid ultrasound 2017, will recheck. 2. Murmur suspicious for aortic stenosis, we will obtain an echocardiogram. 3. The patient is diabetic with known carotid disease, will  start rosuvastatin 5 mg twice a week and check LFTs and lipids in 1 months.  Follow-up in 1 year.  Medication Adjustments/Labs and Tests Ordered: Current medicines are reviewed at length with the patient today.  Concerns regarding medicines are outlined above.  Orders Placed This Encounter  Procedures  . Comp Met (CMET)  . Lipid Profile  . EKG 12-Lead  . ECHOCARDIOGRAM COMPLETE   Meds ordered this encounter  Medications  . rosuvastatin (CRESTOR) 5 MG tablet    Sig: Take 1 tablet (5 mg total) by mouth 2 (two) times a week.    Dispense:  30 tablet    Refill:  1   Patient Instructions  Medication Instructions:   START TAKING ROSUVASTATIN 5 MG BY MOUTH TWICE WEEKLY  If you need a refill on your cardiac medications before your next appointment, please call your pharmacy.     Lab work:  IN ONE MONTH TO CHECK--CMET AND LIPIDS--PLEASE COME FASTING TO THIS LAB APPOINTMENT  If you have labs (blood work) drawn today and your tests are completely normal, you will receive your results only by: .Marland KitchenMyChart Message (if you have MyChart) OR . A paper copy in the mail If you have any lab test that is abnormal or we need to change your treatment, we will call you to review the results.    Testing/Procedures:  Your physician has requested that you have an echocardiogram. Echocardiography is a painless test that uses sound waves to create images of your heart. It provides your doctor with information about the size and shape of your heart and how well your heart's chambers and valves are working. This procedure takes approximately one hour. There are no restrictions for this procedure.    Your physician has requested that you have a carotid duplex. This test is an ultrasound of the carotid arteries in your neck. It looks at blood flow through these arteries that supply the brain with blood. Allow one hour for this exam. There are no restrictions or special instructions.    Follow-Up:  At  [facility], you and your health needs are our priority.  As part of our continuing mission to provide you with exceptional heart care, we have created designated Provider Care Teams.  These Care Teams include your primary Cardiologist (physician) and Advanced Practice Providers (APPs -  Physician Assistants and Nurse Practitioners) who all work together to provide you with the care you need, when you need it. You will need a follow up appointment in 12 months.  Please call our office 2 months in advance to schedule this appointment.  You may see No primary care provider on file. or one of the following Advanced Practice Providers on your designated Care Team:   Lyda Jester, PA-C Melina Copa, PA-C . Ermalinda Barrios, PA-C        Signed, Ena Dawley, MD  04/03/2018 9:35 AM    [place]

## 2018-04-03 NOTE — Patient Instructions (Signed)
Medication Instructions:   START TAKING ROSUVASTATIN 5 MG BY MOUTH TWICE WEEKLY  If you need a refill on your cardiac medications before your next appointment, please call your pharmacy.     Lab work:  IN ONE MONTH TO CHECK--CMET AND LIPIDS--PLEASE COME FASTING TO THIS LAB APPOINTMENT  If you have labs (blood work) drawn today and your tests are completely normal, you will receive your results only by: Marland Kitchen MyChart Message (if you have MyChart) OR . A paper copy in the mail If you have any lab test that is abnormal or we need to change your treatment, we will call you to review the results.    Testing/Procedures:  Your physician has requested that you have an echocardiogram. Echocardiography is a painless test that uses sound waves to create images of your heart. It provides your doctor with information about the size and shape of your heart and how well your heart's chambers and valves are working. This procedure takes approximately one hour. There are no restrictions for this procedure.    Your physician has requested that you have a carotid duplex. This test is an ultrasound of the carotid arteries in your neck. It looks at blood flow through these arteries that supply the brain with blood. Allow one hour for this exam. There are no restrictions or special instructions.    Follow-Up: At [facility], you and your health needs are our priority.  As part of our continuing mission to provide you with exceptional heart care, we have created designated Provider Care Teams.  These Care Teams include your primary Cardiologist (physician) and Advanced Practice Providers (APPs -  Physician Assistants and Nurse Practitioners) who all work together to provide you with the care you need, when you need it. You will need a follow up appointment in 12 months.  Please call our office 2 months in advance to schedule this appointment.  You may see No primary care provider on file. or one of the  following Advanced Practice Providers on your designated Care Team:   Lyda Jester, PA-C Melina Copa, PA-C . Ermalinda Barrios, PA-C

## 2018-04-05 ENCOUNTER — Encounter

## 2018-05-01 ENCOUNTER — Other Ambulatory Visit: Payer: Medicare Other | Admitting: *Deleted

## 2018-05-01 DIAGNOSIS — I779 Disorder of arteries and arterioles, unspecified: Secondary | ICD-10-CM

## 2018-05-01 DIAGNOSIS — R011 Cardiac murmur, unspecified: Secondary | ICD-10-CM

## 2018-05-01 DIAGNOSIS — I739 Peripheral vascular disease, unspecified: Principal | ICD-10-CM

## 2018-05-01 DIAGNOSIS — E785 Hyperlipidemia, unspecified: Secondary | ICD-10-CM

## 2018-05-01 LAB — COMPREHENSIVE METABOLIC PANEL
ALT: 20 IU/L (ref 0–32)
AST: 19 IU/L (ref 0–40)
Albumin/Globulin Ratio: 2.3 — ABNORMAL HIGH (ref 1.2–2.2)
Albumin: 4.5 g/dL (ref 3.7–4.7)
Alkaline Phosphatase: 66 IU/L (ref 39–117)
BUN/Creatinine Ratio: 23 (ref 12–28)
BUN: 25 mg/dL (ref 8–27)
Bilirubin Total: 0.4 mg/dL (ref 0.0–1.2)
CO2: 26 mmol/L (ref 20–29)
Calcium: 9.6 mg/dL (ref 8.7–10.3)
Chloride: 97 mmol/L (ref 96–106)
Creatinine, Ser: 1.09 mg/dL — ABNORMAL HIGH (ref 0.57–1.00)
GFR calc Af Amer: 56 mL/min/{1.73_m2} — ABNORMAL LOW (ref >59)
GFR calc non Af Amer: 48 mL/min/{1.73_m2} — ABNORMAL LOW (ref >59)
Globulin, Total: 2 g/dL (ref 1.5–4.5)
Glucose: 183 mg/dL — ABNORMAL HIGH (ref 65–99)
Potassium: 3.8 mmol/L (ref 3.5–5.2)
Sodium: 141 mmol/L (ref 134–144)
Total Protein: 6.5 g/dL (ref 6.0–8.5)

## 2018-05-01 LAB — LIPID PANEL
Chol/HDL Ratio: 1.6 ratio (ref 0.0–4.4)
Cholesterol, Total: 233 mg/dL — ABNORMAL HIGH (ref 100–199)
HDL: 145 mg/dL (ref >39)
LDL Calculated: 66 mg/dL (ref 0–99)
Triglycerides: 112 mg/dL (ref 0–149)
VLDL Cholesterol Cal: 22 mg/dL (ref 5–40)

## 2018-05-08 ENCOUNTER — Ambulatory Visit (HOSPITAL_COMMUNITY): Payer: Medicare Other | Attending: Cardiology

## 2018-05-08 DIAGNOSIS — R011 Cardiac murmur, unspecified: Secondary | ICD-10-CM | POA: Diagnosis present

## 2018-05-14 ENCOUNTER — Encounter (HOSPITAL_COMMUNITY): Payer: Medicare Other

## 2018-05-16 ENCOUNTER — Other Ambulatory Visit: Payer: Self-pay | Admitting: Cardiology

## 2018-05-16 ENCOUNTER — Ambulatory Visit (HOSPITAL_COMMUNITY)
Admission: RE | Admit: 2018-05-16 | Discharge: 2018-05-16 | Disposition: A | Discharge status: Home / Self Care | Payer: Medicare Other | Source: Ambulatory Visit | Attending: Cardiology | Admitting: Cardiology

## 2018-05-16 DIAGNOSIS — I6523 Occlusion and stenosis of bilateral carotid arteries: Secondary | ICD-10-CM | POA: Insufficient documentation

## 2018-09-04 ENCOUNTER — Other Ambulatory Visit: Payer: Self-pay | Admitting: Family Medicine

## 2018-09-04 DIAGNOSIS — Z1231 Encounter for screening mammogram for malignant neoplasm of breast: Secondary | ICD-10-CM

## 2018-09-17 ENCOUNTER — Other Ambulatory Visit: Payer: Self-pay | Admitting: Cardiology

## 2018-09-17 DIAGNOSIS — I779 Disorder of arteries and arterioles, unspecified: Secondary | ICD-10-CM

## 2018-09-17 DIAGNOSIS — E785 Hyperlipidemia, unspecified: Secondary | ICD-10-CM

## 2018-09-17 DIAGNOSIS — R011 Cardiac murmur, unspecified: Secondary | ICD-10-CM

## 2018-11-21 ENCOUNTER — Ambulatory Visit: Payer: Medicare Other

## 2019-02-12 ENCOUNTER — Other Ambulatory Visit: Payer: Self-pay | Admitting: Orthopedic Surgery

## 2019-02-12 DIAGNOSIS — M545 Low back pain, unspecified: Secondary | ICD-10-CM

## 2019-02-14 ENCOUNTER — Other Ambulatory Visit: Payer: Medicare Other

## 2019-02-19 ENCOUNTER — Ambulatory Visit
Admission: RE | Admit: 2019-02-19 | Discharge: 2019-02-19 | Disposition: A | Discharge status: Home / Self Care | Payer: Medicare Other | Source: Ambulatory Visit | Attending: Orthopedic Surgery | Admitting: Orthopedic Surgery

## 2019-02-19 DIAGNOSIS — M545 Low back pain, unspecified: Secondary | ICD-10-CM

## 2019-05-05 ENCOUNTER — Other Ambulatory Visit: Payer: Self-pay

## 2019-05-05 DIAGNOSIS — R011 Cardiac murmur, unspecified: Secondary | ICD-10-CM

## 2019-05-05 DIAGNOSIS — E785 Hyperlipidemia, unspecified: Secondary | ICD-10-CM

## 2019-05-05 DIAGNOSIS — I779 Disorder of arteries and arterioles, unspecified: Secondary | ICD-10-CM

## 2019-05-05 MED ORDER — ROSUVASTATIN CALCIUM 5 MG PO TABS: 5.0000 mg | ORAL_TABLET | ORAL | 3 refills | Status: DC

## 2019-05-14 ENCOUNTER — Encounter: Payer: Self-pay | Admitting: *Deleted

## 2019-05-14 ENCOUNTER — Telehealth: Payer: Self-pay | Admitting: *Deleted

## 2019-05-14 ENCOUNTER — Telehealth (INDEPENDENT_AMBULATORY_CARE_PROVIDER_SITE_OTHER): Payer: Medicare Other | Admitting: Cardiology

## 2019-05-14 ENCOUNTER — Other Ambulatory Visit: Payer: Self-pay

## 2019-05-14 ENCOUNTER — Encounter: Payer: Self-pay | Admitting: Cardiology

## 2019-05-14 VITALS — BP 135/69 | HR 59 | Temp 97.5°F | Wt 132.0 lb

## 2019-05-14 DIAGNOSIS — E785 Hyperlipidemia, unspecified: Secondary | ICD-10-CM | POA: Diagnosis not present

## 2019-05-14 DIAGNOSIS — R011 Cardiac murmur, unspecified: Secondary | ICD-10-CM

## 2019-05-14 DIAGNOSIS — I6523 Occlusion and stenosis of bilateral carotid arteries: Secondary | ICD-10-CM

## 2019-05-14 NOTE — Progress Notes (Signed)
Virtual Visit via Telephone Note   This visit type was conducted due to national recommendations for restrictions regarding the COVID-19 Pandemic (e.g. social distancing) in an effort to limit this patient's exposure and mitigate transmission in our community.  Due to her co-morbid illnesses, this patient is at least at moderate risk for complications without adequate follow up.  This format is felt to be most appropriate for this patient at this time.  All issues noted in this document were discussed and addressed.  A limited physical exam was performed with this format.  Please refer to the patient's chart for her consent to telehealth for [facility] [place].   Date:  05/14/2019   ID:  Anita Pollard, DOB 1940-08-07, MRN [id number]  Patient Location: Home Provider Location: Home  PCP:  Lawerance Cruel, MD  Cardiologist: Ena Dawley, MD Electrophysiologist:  None   Evaluation Performed:  Follow-Up Visit  Reason for visit: 1 year follow up   History of Present Illness:    Anita Pollard is a 79 y.o. female with hx of diabetes, hypertension, bilateral kidney transplant, 4 years ago, who was previously patient of Dr. Lia Foyer for palpitations and hypertension but not seen since 2014.  The patient was referred by Dr. Harrington Challenger for murmur.  The patient underwent stress testing prior to her kidney transplant that was normal.  She underwent transplant at [facility].  She denies any chest pain or shortness of breath, she has healthy weight, she remains minimally active as she has problem with arthritis.  She denies any dizziness palpitations or syncope.  No lower extremity edema.  05/14/2019 - the patient has been doing well, she received both vaccine doses. She denies any chest pain, DOR, LE edema, no palpitations, no dizziness or syncope. She tolerates her meds well.   The patient does not have symptoms concerning for COVID-19 infection (fever, chills, cough, or new shortness of breath).   Past  Medical History:  Diagnosis Date  . Alcohol abuse   . Anemia   . Arthritis   . Breast cancer ([place]) 2010   Right  . Colon polyp   . Depression   . Diabetes mellitus   . GERD (gastroesophageal reflux disease)   . HTN (hypertension)   . Hypothyroidism   . Osteopenia   . Pancreatitis   . Ulcerative esophagitis    Past Surgical History:  Procedure Laterality Date  . ABDOMINAL HYSTERECTOMY    . ABDOMINOPLASTY    . APPENDECTOMY    . BREAST BIOPSY Right 08/03/2008   Stereo Bx, Malignant  . BREAST LUMPECTOMY Right 08/26/2008  . COSMETIC SURGERY    . ductal carcinoma     right breast   . INSERTION OF DIALYSIS CATHETER Right 09/25/2012   Procedure: INSERTION OF DIALYSIS CATHETER;  Surgeon: Mal Misty, MD;  Location: [facility];  Service: Vascular;  Laterality: Right;  Righ Internal Jugular Placement  . 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 THYROIDECTOMY    . TUMMY TUCK       Current Meds  Medication Sig  . amLODipine (NORVASC) 5 MG tablet Take 10 mg by mouth.   . Cholecalciferol (VITAMIN D3) 5000 UNITS TABS Take 1 tablet by mouth 3 (three) times daily.   Marland Kitchen glipiZIDE (GLUCOTROL XL) 2.5 MG 24 hr tablet Take 5 mg by mouth 4 (four) times daily.   . Glucosamine-Chondroit-Vit C-Mn (GLUCOSAMINE 1500 COMPLEX) CAPS Take by mouth 3 (three) times daily.  . Lancets (FREESTYLE) lancets by Does not apply route.  Marland Kitchen  levothyroxine (SYNTHROID) 200 MCG tablet Take 200 mcg by mouth daily before breakfast.  . lipase/protease/amylase (CREON) 12000 units CPEP capsule Creon 12,000-38,000-60,000 unit capsule,delayed release  TAKE 2 CAPSULES BY ORAL ROUTE 3 TIMES PER DAY WITH MEALS AND 1 CAPSULE WITH EACH SNACK  . losartan (COZAAR) 25 MG tablet Take 25 mg by mouth daily.  . magnesium oxide (MAGOX 400) 400 (241.3 MG) MG tablet Take 400 mg by mouth 3 (three) times daily. Pt takes a total of 800 mg every day  . Melatonin 5 MG CAPS Take by mouth.  . Multiple Vitamin (MULTIVITAMIN) tablet Take 1 tablet by  mouth daily.  . mycophenolate (MYFORTIC) 180 MG EC tablet Take 180 mg by mouth 2 (two) times daily. Pt takes 2 tablets twice a day to equal 4 tablets a day  . niacinamide 500 MG tablet Take by mouth.  Marland Kitchen omeprazole (PRILOSEC) 20 MG capsule Take 20 mg by mouth daily.  . potassium chloride (MICRO-K) 10 MEQ CR capsule Take 30 mEq by mouth 3 (three) times daily.   . pramipexole (MIRAPEX) 0.125 MG tablet Take 0.125 mg by mouth.  . predniSONE (DELTASONE) 5 MG tablet Take 5 mg by mouth daily at 12 noon.   . Probiotic Product (PROBIOTIC DAILY) CAPS Take one capsule every other day  . raloxifene (EVISTA) 60 MG tablet Take 60 mg by mouth daily.   . rosuvastatin (CRESTOR) 5 MG tablet Take 1 tablet (5 mg total) by mouth 2 (two) times a week.  . sulfamethoxazole-trimethoprim (BACTRIM,SEPTRA) 400-80 MG tablet Take by mouth.  . tacrolimus (PROGRAF) 1 MG capsule Pt takes 1.5 in the morning and 1.5 in the evening  . temazepam (RESTORIL) 15 MG capsule as needed.   . Turmeric (QC TUMERIC COMPLEX PO) Take 1,500 mg by mouth daily.     Allergies:   Penicillins, Banana, Detrol [tolterodine], Quetiapine, Risperidone and related, and Lisinopril   Social History   Tobacco Use  . Smoking status: Former Smoker    Quit date: 09/25/1983    Years since quitting: 35.6  . Smokeless tobacco: Never Used  Substance Use Topics  . 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 Drug use: No     Family Hx: The patient's family history includes Atrial fibrillation in her brother; Colon polyps in her brother; Dementia in her sister; Diabetes in her brother and father; Hypertension in her father, mother, and sister; Multiple myeloma in her sister; Thyroid cancer (age of onset: 16) in her daughter.  ROS:   Please see the history of present illness.    All other systems reviewed and are negative.   Prior CV studies:   The following studies were  reviewed today:  Labs/Other Tests and Data Reviewed:    EKG:  No ECG reviewed.  Recent Labs: No results found for requested labs within last 8760 hours.   Recent Lipid Panel Lab Results  Component Value Date/Time   CHOL 233 (H) 05/01/2018 07:49 AM   TRIG 112 05/01/2018 07:49 AM   HDL 145 05/01/2018 07:49 AM   CHOLHDL 1.6 05/01/2018 07:49 AM   LDLCALC 66 05/01/2018 07:49 AM   Wt Readings from Last 3 Encounters:  05/14/19 132 lb (59.9 kg)  04/03/18 133 lb 6.4 oz (60.5 kg)  12/29/17 133 lb (60.3 kg)    Objective:    Vital Signs:  BP 135/69   Pulse (!) 59   Temp (!)  97.5 F (36.4 C)   Wt 132 lb (59.9 kg)   BMI 24.94 kg/m    VITAL SIGNS:  reviewed   ASSESSMENT & PLAN:    1. Prior history of carotid disease, last carotid ultrasound in 04/2018 showed minimal plaque in both carotid arteries 1-39%. 2. Systolic murmur - echocardiogram in 04/2018 didn't show any significant valvular abnormalities. 3. Hyperlipidemia - with known DM, on rosuvastatin 5 mg po daily, tolerated well, AST 19, ALT 20, LDL 66, TG 112. We will continue the same dose.  COVID-19 Education: The signs and symptoms of COVID-19 were discussed with the patient and how to seek care for testing (follow up with PCP or arrange E-visit).  The importance of social distancing was discussed today.  Time:   Today, I have spent 15 minutes with the patient with telehealth technology discussing the above problems.    Medication Adjustments/Labs and Tests Ordered: Current medicines are reviewed at length with the patient today.  Concerns regarding medicines are outlined above.   Tests Ordered: No orders of the defined types were placed in this encounter.  Medication Changes: No orders of the defined types were placed in this encounter.  Follow Up:  In Person in 1 year(s)  Signed, Ena Dawley, MD  05/14/2019 11:08 AM    [place]

## 2019-05-14 NOTE — Telephone Encounter (Signed)
Virtual Visit Pre-Appointment Phone Call  "(Name), I am calling you today to discuss your upcoming appointment. We are currently trying to limit exposure to the virus that causes COVID-19 by seeing patients at home rather than in the office."  1. "What is the BEST phone number to call the day of the visit?" - include this in appointment notes  2. "Do you have or have access to (through a family member/friend) a smartphone with video capability that we can use for your visit?"  a. If no - list the appointment type as a PHONE visit in appointment notes-yes updated  3. Confirm consent - "In the setting of the current Covid19 crisis, you are scheduled for a (phone or video) visit with your provider on (date) at (time).  Just as we do with many in-office visits, in order for you to participate in this visit, we must obtain consent.  If you'd like, I can send this to your mychart (if signed up) or email for you to review.  Otherwise, I can obtain your verbal consent now.  All virtual visits are billed to your insurance company just like a normal visit would be.  By agreeing to a virtual visit, we'd like you to understand that the technology does not allow for your provider to perform an examination, and thus may limit your provider's ability to fully assess your condition. If your provider identifies any concerns that need to be evaluated in person, we will make arrangements to do so.  Finally, though the technology is pretty good, we cannot assure that it will always work on either your or our end, and in the setting of a video visit, we may have to convert it to a phone-only visit.  In either situation, we cannot ensure that we have a secure connection.  Are you willing to proceed?" STAFF: Did the patient verbally acknowledge consent to telehealth visit? Document YES/NO here: YES PT GAVE VERBAL CONSENT FOR DR. Meda Coffee TO TREAT HER VIA TELEPHONE VISIT ON 05/14/19  4. Advise patient to be prepared - "Two  hours prior to your appointment, go ahead and check your blood pressure, pulse, oxygen saturation, and your weight (if you have the equipment to check those) and write them all down. When your visit starts, your provider will ask you for this information. If you have an Apple Watch or Kardia device, please plan to have heart rate information ready on the day of your appointment. Please have a pen and paper handy nearby the day of the visit as well."--YES PT AWARE   5. Inform patient they will receive a phone call 15 minutes prior to their appointment time (may be from unknown caller ID) so they should be prepared to answer YES AWARE    TELEPHONE CALL NOTE  Anita Pollard has been deemed a candidate for a follow-up tele-health visit to limit community exposure during the Covid-19 pandemic. I spoke with the patient via phone to ensure availability of phone/video source, confirm preferred email & phone number, and discuss instructions and expectations.  I reminded Anita Pollard to be prepared with any vital sign and/or heart rhythm information that could potentially be obtained via home monitoring, at the time of her visit. I reminded Anita Pollard to expect a phone call prior to her visit.  Nuala Alpha, LPN 7/56/4332 95:18 AM       FULL LENGTH CONSENT FOR TELE-HEALTH VISIT   I hereby voluntarily request, consent and authorize [facility]  and its employed or contracted physicians, Engineer, materials, nurse practitioners or other licensed health care professionals (the Practitioner), to provide me with telemedicine health care services (the "Services") as deemed necessary by the treating Practitioner. I acknowledge and consent to receive the Services by the Practitioner via telemedicine. I understand that the telemedicine visit will involve communicating with the Practitioner through live audiovisual communication technology and the disclosure of certain medical information by electronic  transmission. I acknowledge that I have been given the opportunity to request an in-person assessment or other available alternative prior to the telemedicine visit and am voluntarily participating in the telemedicine visit.  I understand that I have the right to withhold or withdraw my consent to the use of telemedicine in the course of my care at any time, without affecting my right to future care or treatment, and that the Practitioner or I may terminate the telemedicine visit at any time. I understand that I have the right to inspect all information obtained and/or recorded in the course of the telemedicine visit and may receive copies of available information for a reasonable fee.  I understand that some of the potential risks of receiving the Services via telemedicine include:  Marland Kitchen Delay or interruption in medical evaluation due to technological equipment failure or disruption; . Information transmitted may not be sufficient (e.g. poor resolution of images) to allow for appropriate medical decision making by the Practitioner; and/or  . In rare instances, security protocols could fail, causing a breach of personal health information.  Furthermore, I acknowledge that it is my responsibility to provide information about my medical history, conditions and care that is complete and accurate to the best of my ability. I acknowledge that Practitioner's advice, recommendations, and/or decision may be based on factors not within their control, such as incomplete or inaccurate data provided by me or distortions of diagnostic images or specimens that may result from electronic transmissions. I understand that the practice of medicine is not an exact science and that Practitioner makes no warranties or guarantees regarding treatment outcomes. I acknowledge that I will receive a copy of this consent concurrently upon execution via email to the email address I last provided but may also request a printed copy by  calling the office of [facility].    I understand that my insurance will be billed for this visit.   I have read or had this consent read to me. . I understand the contents of this consent, which adequately explains the benefits and risks of the Services being provided via telemedicine.  . I have been provided ample opportunity to ask questions regarding this consent and the Services and have had my questions answered to my satisfaction. . I give my informed consent for the services to be provided through the use of telemedicine in my medical care  By participating in this telemedicine visit I agree to the above. PT GAVE VERBAL CONSENT FOR DR. Meda Coffee TO TREAT HER VIA VIRTUAL VISIT ON 05/14/19 AT 1020

## 2019-05-14 NOTE — Patient Instructions (Signed)
Medication Instructions:   Your physician recommends that you continue on your current medications as directed. Please refer to the Current Medication list given to you today.  *If you need a refill on your cardiac medications before your next appointment, please call your pharmacy*   Follow-Up: At [facility], you and your health needs are our priority.  As part of our continuing mission to provide you with exceptional heart care, we have created designated Provider Care Teams.  These Care Teams include your primary Cardiologist (physician) and Advanced Practice Providers (APPs -  Physician Assistants and Nurse Practitioners) who all work together to provide you with the care you need, when you need it.  Your next appointment:   1 year with Dr. Meda Coffee in person  The format for your next appointment:   In person   Provider:   Dr. Ena Dawley  Your physician wants you to follow-up in: [place] DR. Johann Capers will receive a reminder letter in the mail two months in advance. If you don't receive a letter, please call our office to schedule the follow-up appointment.

## 2019-06-13 ENCOUNTER — Other Ambulatory Visit: Payer: Self-pay

## 2019-06-13 ENCOUNTER — Ambulatory Visit
Admission: RE | Admit: 2019-06-13 | Discharge: 2019-06-13 | Disposition: A | Discharge status: Home / Self Care | Payer: Medicare Other | Source: Ambulatory Visit | Attending: Family Medicine | Admitting: Family Medicine

## 2019-06-13 DIAGNOSIS — Z1231 Encounter for screening mammogram for malignant neoplasm of breast: Secondary | ICD-10-CM

## 2019-06-16 ENCOUNTER — Ambulatory Visit (INDEPENDENT_AMBULATORY_CARE_PROVIDER_SITE_OTHER): Payer: 59 | Admitting: Podiatry

## 2019-06-16 ENCOUNTER — Other Ambulatory Visit: Payer: Self-pay

## 2019-06-16 ENCOUNTER — Other Ambulatory Visit: Payer: Self-pay | Admitting: Podiatry

## 2019-06-16 ENCOUNTER — Ambulatory Visit (INDEPENDENT_AMBULATORY_CARE_PROVIDER_SITE_OTHER): Payer: Medicare Other

## 2019-06-16 ENCOUNTER — Encounter: Payer: Self-pay | Admitting: Podiatry

## 2019-06-16 DIAGNOSIS — M778 Other enthesopathies, not elsewhere classified: Secondary | ICD-10-CM

## 2019-06-16 DIAGNOSIS — M779 Enthesopathy, unspecified: Secondary | ICD-10-CM

## 2019-06-16 DIAGNOSIS — M25572 Pain in left ankle and joints of left foot: Secondary | ICD-10-CM

## 2019-06-17 NOTE — Progress Notes (Signed)
Subjective:   Patient ID: Anita Pollard, female   DOB: 79 y.o.   MRN: [id number]   HPI Patient presents stating has started develop a lot of discomfort in the left ankle that she wants to have checked and she is not been here for around 2 years   ROS      Objective:  Physical Exam  Neurovascular status intact with exquisite discomfort sinus tarsi left     Assessment:  Acute sinus tarsitis left with probable arthritis     Plan:  H&P x-ray reviewed sterile prep done injected sinus tarsi left 3 mg Kenalog 5 mg Xylocaine reviewed point to recheck  X-rays indicate arthritis but no indications of stress fracture or acute injury

## 2019-08-14 ENCOUNTER — Other Ambulatory Visit: Payer: Self-pay

## 2019-08-14 ENCOUNTER — Encounter (HOSPITAL_COMMUNITY): Payer: Self-pay | Admitting: Emergency Medicine

## 2019-08-14 ENCOUNTER — Emergency Department (HOSPITAL_COMMUNITY)
Admission: EM | Admit: 2019-08-14 | Discharge: 2019-08-14 | Disposition: A | Discharge status: Home / Self Care | Payer: Medicare Other | Attending: Emergency Medicine | Admitting: Emergency Medicine

## 2019-08-14 DIAGNOSIS — Z7984 Long term (current) use of oral hypoglycemic drugs: Secondary | ICD-10-CM | POA: Insufficient documentation

## 2019-08-14 DIAGNOSIS — Z87891 Personal history of nicotine dependence: Secondary | ICD-10-CM | POA: Diagnosis not present

## 2019-08-14 DIAGNOSIS — E039 Hypothyroidism, unspecified: Secondary | ICD-10-CM | POA: Insufficient documentation

## 2019-08-14 DIAGNOSIS — S81811A Laceration without foreign body, right lower leg, initial encounter: Secondary | ICD-10-CM | POA: Diagnosis present

## 2019-08-14 DIAGNOSIS — E119 Type 2 diabetes mellitus without complications: Secondary | ICD-10-CM | POA: Insufficient documentation

## 2019-08-14 DIAGNOSIS — Z79899 Other long term (current) drug therapy: Secondary | ICD-10-CM | POA: Insufficient documentation

## 2019-08-14 DIAGNOSIS — Y999 Unspecified external cause status: Secondary | ICD-10-CM | POA: Insufficient documentation

## 2019-08-14 DIAGNOSIS — W208XXA Other cause of strike by thrown, projected or falling object, initial encounter: Secondary | ICD-10-CM | POA: Insufficient documentation

## 2019-08-14 DIAGNOSIS — Z940 Kidney transplant status: Secondary | ICD-10-CM | POA: Diagnosis not present

## 2019-08-14 DIAGNOSIS — I10 Essential (primary) hypertension: Secondary | ICD-10-CM | POA: Insufficient documentation

## 2019-08-14 DIAGNOSIS — Y929 Unspecified place or not applicable: Secondary | ICD-10-CM | POA: Diagnosis not present

## 2019-08-14 DIAGNOSIS — Y939 Activity, unspecified: Secondary | ICD-10-CM | POA: Insufficient documentation

## 2019-08-14 NOTE — Discharge Instructions (Addendum)
You were evaluated in the Emergency Department and after careful evaluation, we did not find any emergent condition requiring admission or further testing in the hospital.  Your exam/testing today was overall reassuring.  As discussed, your injury would not benefit from stiches.  Please change the dressing once a day for the next week and keep a close eye on the wound for worsening appearance.  With surrounding redness or fever we recommend returning to the Emergency Department for evaluation.  Please return to the Emergency Department if you experience any worsening of your condition.  We encourage you to follow up with a primary care provider.  Thank you for allowing [place] to be a part of your care.

## 2019-08-14 NOTE — ED Notes (Signed)
Dressing applied to patient's right lower leg where skin tear is.  Discussed discharge instructions with patient. All questions addressed. Patient transported home via car by her friend.

## 2019-08-14 NOTE — ED Provider Notes (Signed)
[facility] Emergency Department Provider Note MRN:  [id number]  Arrival date & time: 08/14/19     Chief Complaint   Laceration   History of Present Illness   Anita Pollard is a 79 y.o. year-old female with a history of diabetes, kidney transplant presenting to the ED with chief complaint of laceration.  Patient explains that last night.  The chair was folded and leaning against the wall and it fell down onto her right shin.  She was struck by a chair she sustained a hematoma and a skin tear to the right lower leg.  Denies any other injuries, no fall, no head trauma, no anticoagulation.  Ambulating fine but with some discomfort.  Review of Systems  A complete 10 system review of systems was obtained and all systems are negative except as noted in the HPI and PMH.   Patient's Health History    Past Medical History:  Diagnosis Date  . Alcohol abuse   . Anemia   . Arthritis   . Breast cancer ([place]) 2010   Right  . Colon polyp   . Depression   . Diabetes mellitus   . GERD (gastroesophageal reflux disease)   . HTN (hypertension)   . Hypothyroidism   . Osteopenia   . Pancreatitis   . Ulcerative esophagitis     Past Surgical History:  Procedure Laterality Date  . ABDOMINAL HYSTERECTOMY    . ABDOMINOPLASTY    . APPENDECTOMY    . BREAST BIOPSY Right 08/03/2008   Stereo Bx, Malignant  . BREAST LUMPECTOMY Right 08/26/2008  . COSMETIC SURGERY    . ductal carcinoma     right breast   . INSERTION OF DIALYSIS CATHETER Right 09/25/2012   Procedure: INSERTION OF DIALYSIS CATHETER;  Surgeon: Mal Misty, MD;  Location: [facility];  Service: Vascular;  Laterality: Right;  Righ Internal Jugular Placement  . 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 THYROIDECTOMY    . TUMMY TUCK      Family History  Problem Relation Age of Onset  . Thyroid cancer Daughter 66  . Diabetes Father   . Hypertension Father   . Diabetes Brother   . Hypertension Mother   .  Hypertension Sister   . Colon polyps Brother   . Atrial fibrillation Brother   . Dementia Sister   . Multiple myeloma Sister     Social History   Socioeconomic History  . Marital status: Widowed    Spouse name: Not on file  . Number of children: Not on file  . Years of education: Not on file  . Highest education level: Not on file  Occupational History  . Not on file  Tobacco Use  . Smoking status: Former Smoker    Quit date: 09/25/1983    Years since quitting: 35.9  . Smokeless tobacco: Never Used  Substance and Sexual Activity  . 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 Drug use: No  . Sexual activity: Never  Other Topics Concern  . Not on file  Social History Narrative  . Not on file   Social Determinants of Health   Financial Resource Strain:   . Difficulty of Paying Living Expenses:   Food Insecurity:   . Worried About Charity fundraiser in the Last Year:   . Arboriculturist in the Last Year:   Transportation Needs:   .  Lack of Transportation (Medical):   Marland Kitchen Lack of Transportation (Non-Medical):   Physical Activity:   . Days of Exercise per Week:   . Minutes of Exercise per Session:   Stress:   . Feeling of Stress :   Social Connections:   . Frequency of Communication with Friends and Family:   . Frequency of Social Gatherings with Friends and Family:   . Attends Religious Services:   . Active Member of Clubs or Organizations:   . Attends Archivist Meetings:   Marland Kitchen Marital Status:   Intimate Partner Violence:   . Fear of Current or Ex-Partner:   . Emotionally Abused:   Marland Kitchen Physically Abused:   . Sexually Abused:      Physical Exam   Vitals:   08/14/19 1339  BP: (!) 171/77  Pulse: 76  Resp: 15  Temp: 98.6 F (37 C)  SpO2: 98%    CONSTITUTIONAL: Well-appearing, NAD NEURO:  Alert and oriented x 3, no focal deficits EYES:  eyes equal and  reactive ENT/NECK:  no LAD, no JVD CARDIO: Regular rate, well-perfused, normal S1 and S2 PULM:  CTAB no wheezing or rhonchi GI/GU:  normal bowel sounds, non-distended, non-tender MSK/SPINE:  No gross deformities, no edema SKIN: Large skin tear to the right mid shin PSYCH:  Appropriate speech and behavior  *Additional and/or pertinent findings included in MDM below  Diagnostic and Interventional Summary    EKG Interpretation  Date/Time:    Ventricular Rate:    PR Interval:    QRS Duration:   QT Interval:    QTC Calculation:   R Axis:     Text Interpretation:        Labs Reviewed - No data to display  No orders to display    Medications - No data to display   Procedures  /  Critical Care Procedures  ED Course and Medical Decision Making  I have reviewed the triage vital signs, the nursing notes, and pertinent available records from the EMR.  Listed above are laboratory and imaging tests that I personally ordered, reviewed, and interpreted and then considered in my medical decision making (see below for details).      Skin tear, with patient presenting nearly 12 hours after the injury.  I do not see benefit to suture repair, this will need to heal by secondary intention.  There is increased risk of infection given patient's history of diabetes and immunosuppression.  Advised her to monitor this very closely and have a low threshold to return to the emergency department for repeat evaluation.  Washed and dressed in the emergency department.    Barth Kirks. Sedonia Small, [place] [email or web address]  Final Clinical Impressions(s) / ED Diagnoses     ICD-10-CM   1. Skin tear of right lower leg without complication, initial encounter  S81.811A     ED Discharge Orders    None       Discharge Instructions Discussed with and Provided to Patient:     Discharge Instructions     You were evaluated in the Emergency  Department and after careful evaluation, we did not find any emergent condition requiring admission or further testing in the hospital.  Your exam/testing today was overall reassuring.  As discussed, your injury would not benefit from stiches.  Please change the dressing once a day for the next week and keep a close eye on the wound for worsening appearance.  With surrounding redness or fever  we recommend returning to the Emergency Department for evaluation.  Please return to the Emergency Department if you experience any worsening of your condition.  We encourage you to follow up with a primary care provider.  Thank you for allowing [place] to be a part of your care.       Maudie Flakes, MD 08/14/19 1455

## 2019-08-21 ENCOUNTER — Other Ambulatory Visit: Payer: Self-pay

## 2019-08-21 ENCOUNTER — Telehealth: Payer: Self-pay

## 2019-08-21 ENCOUNTER — Ambulatory Visit (INDEPENDENT_AMBULATORY_CARE_PROVIDER_SITE_OTHER): Payer: Medicare Other | Admitting: Internal Medicine

## 2019-08-21 VITALS — BP 132/73 | HR 72 | Temp 97.8°F | Wt 132.0 lb

## 2019-08-21 DIAGNOSIS — S81819A Laceration without foreign body, unspecified lower leg, initial encounter: Secondary | ICD-10-CM | POA: Diagnosis not present

## 2019-08-21 NOTE — Progress Notes (Signed)
   Subjective:     Patient ID: Anita Pollard, female    DOB: Aug 06, 1940, 79 y.o.   MRN: [id number]  No chief complaint on file.   HPI: The patient is a 79 y.o. female here for skin tear of the right lower leg following trauma.  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  She reports developing a hematoma to the area that has opened up and drained.  She visited the urgent care and was recommended to follow-up with her primary care doctor.  She saw here PCP, Dr. Harrington Challenger at [facility] on 08/19/19 and was referred to our clinic due to poor wound healing.  She has been using bacitracin on the wound daily and wrapping it with gauze and ace.  She reports tenderness to the wound area.  She denies purulent drainage, fever/chills or increased warmth or erythema to the wound.  She asked about using ACell on the wound.   Review of Systems  All other systems reviewed and are negative.    has a past medical history of Alcohol abuse, Anemia, Arthritis, Breast cancer ([place]) (2010), Colon polyp, Depression, Diabetes mellitus, GERD (gastroesophageal reflux disease), HTN (hypertension), Hypothyroidism, Osteopenia, Pancreatitis, and Ulcerative esophagitis.  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  reports that she quit smoking about 35 years ago. She has never used smokeless tobacco. Objective:   Vital Signs BP 132/73   Pulse 72   Temp 97.8 F (36.6 C)   Wt 132 lb (59.9 kg)   BMI 24.94 kg/m  Vital Signs and Nursing Note Reviewed Physical Exam  Skin:  Right leg wound measures:  10.5 x 8.0 x 0.1 cm Granulation tissue throughout    Devitalized skin throughout      Assessment/Plan:     ICD-10-CM   1. Skin tear of lower leg without complication, initial encounter  S81.819A    Assessment:  Skin tear of lower leg following trauma  Patient had an original picture of the wound and in comparison the wound today appears to be healing.  The wound has no signs of infection. Devitalized tissue was noted throughout and this was debrided in office with pickups and scissors.  Office donated ACell provided by Dr. Marla Roe was used.  Dressing change with Adaptic, lubricant, ABD, Kerlix and Ace was done.  Instructions were provided to the patient for daily dressing changes.  Plan -In office debridement with scissors and pickups -In office dressing change -Daily dressing changes with Adaptic and lubricant -Form sent to [facility] for patient supplies -Follow-up next week  Boyd Kerbs, DO 08/21/2019, 2:36 PM

## 2019-08-21 NOTE — Telephone Encounter (Signed)
Faxed medical supplies: ABD pads, kerlix, adaptic and ace wrap to [facility].

## 2019-08-26 ENCOUNTER — Other Ambulatory Visit: Payer: Self-pay

## 2019-08-26 ENCOUNTER — Ambulatory Visit (INDEPENDENT_AMBULATORY_CARE_PROVIDER_SITE_OTHER): Payer: Medicare Other | Admitting: Internal Medicine

## 2019-08-26 ENCOUNTER — Encounter: Payer: Self-pay | Admitting: Internal Medicine

## 2019-08-26 VITALS — BP 124/72 | HR 71 | Temp 98.0°F | Ht 61.0 in | Wt 132.0 lb

## 2019-08-26 DIAGNOSIS — S81811D Laceration without foreign body, right lower leg, subsequent encounter: Secondary | ICD-10-CM | POA: Diagnosis not present

## 2019-08-26 MED ORDER — COLLAGENASE 250 UNIT/GM EX OINT: 1.0000 "application " | TOPICAL_OINTMENT | Freq: Every day | CUTANEOUS | 0 refills | Status: DC

## 2019-08-26 NOTE — Patient Instructions (Signed)
Anita Pollard It was a pleasure seeing you today.  Please follow the instructions below for your wound care  1) Place Santyl on the wound 2) followed by adaptic (the mesh) 3) place abd 4) cover with gauze and ace bandage   Please follow up with me in 1 weeks.  Call us at [phone] with any questions or concerns

## 2019-08-26 NOTE — Progress Notes (Signed)
   Subjective:     Patient ID: Anita Pollard, female    DOB: [birth year], 79 y.o.   MRN: [id number]  Chief Complaint  Patient presents with  . Follow-up    2 weeks for skin tear of lower leg w/o complication    HPI: The patient is a 79 y.o. female here for follow-up of skin tear of the right lower leg following trauma.  Patient states that she has been doing daily dressing changes with adaptic and lubricant.  She keeps it covered with gauze and ace wrap.  She continues to report tenderness directly to the wound.  She denies purulent drainage, fever/chills or increased warmth or erythema to the wound.  Review of Systems  All other systems reviewed and are negative.    has a past medical history of Alcohol abuse, Anemia, Arthritis, Breast cancer ([place]) (2010), Colon polyp, Depression, Diabetes mellitus, GERD (gastroesophageal reflux disease), HTN (hypertension), Hypothyroidism, Osteopenia, Pancreatitis, and Ulcerative esophagitis.  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  reports that she quit smoking about 35 years ago. She has never used smokeless tobacco. Objective:   Vital Signs BP 124/72 (BP Location: Left Arm, Patient Position: Sitting, Cuff Size: Normal)   Pulse 71   Temp 98 F (36.7 C) (Temporal)   Ht 5\' 1"  (1.549 m)   Wt 132 lb (59.9 kg)   SpO2 95%   BMI 24.94 kg/m  Vital Signs and Nursing Note Reviewed Physical Exam  Constitutional: She is well-developed, well-nourished, and in no distress.  Skin:  Right leg wound measures:  10.0 x 8.5 x 0.1 cm Granulation tissue throughout the wound  Devitalized tissue throughout       Assessment/Plan:     ICD-10-CM   1. Skin tear of right  lower leg without complication, subsequent encounter  S81.811D    Assessment: Skin tear of lower leg following trauma  The wound continues to show improvement.  Donated ACell was placed at last clinic visit.  Devitalized tissue is noted throughout.  This was attempted to be debrided in office.  I think patient may benefit from Santyl ointment at this time.  I recommended she continue to use Adaptic, ABD Kerlix and Ace for the dressing change.    Plan -In office dressing change with Vaseline, Adaptic, ABD Kerlix and Ace -Daily dressing change with Santyl, Adaptic, ABD, Kerlix and Ace -Follow-up next week  Boyd Kerbs, DO 08/26/2019, 1:57 PM

## 2019-09-02 ENCOUNTER — Encounter: Payer: Self-pay | Admitting: Internal Medicine

## 2019-09-02 ENCOUNTER — Other Ambulatory Visit: Payer: Self-pay

## 2019-09-02 ENCOUNTER — Ambulatory Visit (INDEPENDENT_AMBULATORY_CARE_PROVIDER_SITE_OTHER): Payer: Medicare Other | Admitting: Internal Medicine

## 2019-09-02 VITALS — BP 136/75 | HR 79 | Temp 97.1°F

## 2019-09-02 DIAGNOSIS — S81811D Laceration without foreign body, right lower leg, subsequent encounter: Secondary | ICD-10-CM | POA: Diagnosis not present

## 2019-09-02 MED ORDER — COLLAGENASE 250 UNIT/GM EX OINT: 1.0000 "application " | TOPICAL_OINTMENT | Freq: Every day | CUTANEOUS | 0 refills | Status: DC

## 2019-09-02 NOTE — Progress Notes (Signed)
   Subjective:     Patient ID: Anita Pollard, female    DOB: 04-Nov-1940, 78 y.o.   MRN: [id number]  Chief Complaint  Patient presents with  . Follow-up    HPI: The patient is a 79 y.o. female here for follow-up of skin tear of the right lower leg following trauma.  Patient states she has been using Santyl daily with dressing changes for the past week.  She has noticed an improvement in wound size and appearance.  She also reports her pain level has decreased. She denies purulent drainage, fever/chills or increased warmth or erythema to the wound.   Review of Systems  All other systems reviewed and are negative.    has a past medical history of Alcohol abuse, Anemia, Arthritis, Breast cancer ([place]) (2010), Colon polyp, Depression, Diabetes mellitus, GERD (gastroesophageal reflux disease), HTN (hypertension), Hypothyroidism, Osteopenia, Pancreatitis, and Ulcerative esophagitis.  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  reports that she quit smoking about 35 years ago. She has never used smokeless tobacco. Objective:   Vital Signs BP 136/75 (BP Location: Left Arm, Patient Position: Sitting, Cuff Size: Normal)   Pulse 79   Temp (!) 97.1 F (36.2 C) (Temporal)   SpO2 95%  Vital Signs and Nursing Note Reviewed Physical Exam  Constitutional: She is well-developed, well-nourished, and in no distress.  Skin: Skin is warm and dry.  Right leg wound measures:  7.5 x 5.5 x 0.1 Some sloughing noted Necrotic tissue present Epithelialized tissue present Granulation tissue present       Assessment/Plan:     ICD-10-CM   1. Skin tear of right lower leg without complication, subsequent encounter  S81.811D      Assessment: Skin tear of lower leg following trauma  Today the wound appears to be healing with no signs of infection today.  There is some sloughing and necrotic tissue noted and this was debrided in office with curette, gauze and wound cleanser.  There is some nice epithelialized tissue growing inwards.  She is currently using Santyl and I recommended to continue this for 1 more week.  She is having a lot of drainage and we may move to a foam dressing after reassessment next week.  Plan -In office sharp debridement with curette, gauze and wound cleanser -Daily dressing changes with Santyl, Adaptic, ABD, Kerlix and Ace -In office dressing change with Vaseline, Adaptic, nonstick pad and Ace -Follow-up in 1 week  Boyd Kerbs, DO 09/02/2019, 1:39 PM

## 2019-09-03 ENCOUNTER — Telehealth: Payer: Self-pay | Admitting: Internal Medicine

## 2019-09-03 NOTE — Telephone Encounter (Signed)
Returned patient's call from the vm she left yesterday.   Her prescription drug plan will not cover the Sentyl until 6/13 so she would like to know if she should use petroleum jelly Thursday-Sunday? Also, since she will not have been on medication but 2 days, she wanted to know if she should push her appt back a week. Please call patient to advise

## 2019-09-04 NOTE — Telephone Encounter (Signed)
Called and spoke with the patient and informed her of the message below.  Patient verbalized understanding and agreed.  Patient reschedules her appointment for (Wed-09/17/19 @ 1:00pm).//AB/CMA

## 2019-09-04 NOTE — Telephone Encounter (Signed)
Please call patient and let her know to finish the santyl she has currently.  She can use petroleum jelly by the time the next Santyl is refilled.  Please reschedule her for the week of the 21st.  Thank you

## 2019-09-09 ENCOUNTER — Ambulatory Visit: Payer: Medicare Other | Admitting: Internal Medicine

## 2019-09-17 ENCOUNTER — Other Ambulatory Visit: Payer: Self-pay

## 2019-09-17 ENCOUNTER — Ambulatory Visit (INDEPENDENT_AMBULATORY_CARE_PROVIDER_SITE_OTHER): Payer: Medicare Other | Admitting: Internal Medicine

## 2019-09-17 ENCOUNTER — Encounter: Payer: Self-pay | Admitting: Internal Medicine

## 2019-09-17 VITALS — BP 152/77 | HR 75 | Temp 97.8°F

## 2019-09-17 DIAGNOSIS — S81811D Laceration without foreign body, right lower leg, subsequent encounter: Secondary | ICD-10-CM | POA: Diagnosis not present

## 2019-09-17 NOTE — Patient Instructions (Signed)
Ms. Mohiuddin,  It was a pleasure seeing you today.  Please follow the instructions below for your wound care  Take the foam dressing that was placed in the office on Saturday the 26th  1) adatic dressing changes daily (can add vaseline over the adaptic) 2) followed by non stick pad 3) wrap with gauze 4) cover with ace bandage   When your new supplies come in you can stop the instructions above and follow the instructions below  1) place foam dressing over the wound  2) take off after 48hrs and reapply a new one  Please follow up with me in 1 week.  Call us at [phone] with any questions or concerns  [employer] is a medical supply company.  We have sent them an order for your supplies.  Please contact them if you do not receive your supplies in the next 72hrs.  Their number is [phone] and website is [email or web address]

## 2019-09-17 NOTE — Progress Notes (Signed)
° °  Subjective:     Patient ID: Anita Pollard, female    DOB: Mar 16, 1941, 79 y.o.   MRN: [id number]  Chief Complaint  Patient presents with   Follow-up    HPI: The patient is a 79 y.o. female here for follow-up ofskin tear of the right lower leg following trauma  Patient states that for the past 2 weeks she has been using santyl with adaptic daily.  She states she finished the course of santyl yesterday.  She continues to have moderate drainage to the wound.  She states that the pain has stopped and occasionally has stinging pain.  She denies purulent drainage, fever/chills or increased warmth or erythema to the wound.   Review of Systems  All other systems reviewed and are negative.    has a past medical history of Alcohol abuse, Anemia, Arthritis, Breast cancer ([place]) (2010), Colon polyp, Depression, Diabetes mellitus, GERD (gastroesophageal reflux disease), HTN (hypertension), Hypothyroidism, Osteopenia, Pancreatitis, and Ulcerative esophagitis.  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  reports that she quit smoking about 36 years ago. She has never used smokeless tobacco. Objective:   Vital Signs BP (!) 152/77 (BP Location: Left Arm, Patient Position: Sitting, Cuff Size: Normal)    Pulse 75    Temp 97.8 F (36.6 C) (Temporal)    SpO2 97%  Vital Signs and Nursing Note Reviewed Physical Exam Skin:    Comments: Right leg wound measures:  5 x 3 x 0.1 sloughing noted Epithelialized tissue present Granulation tissue present          Assessment/Plan:     ICD-10-CM   1. Skin tear of right lower leg without complication, subsequent encounter  S81.811D    Assessment:  Skin tear of right lower  leg following trauma  The wound has progressed nicely in appearance and size.  No signs of infection today.  There was sloughing in the wound bed and this was debrided with curette, guaze and cleanser.  Patient reports moderate drainage and at this time I recommended dressing the wound with mepilex boarder foam.  Will send request for supplies through [facility].  Plan -In office sharp debridement with curette, gauze and wound cleanser -in office mepilex border dressing - follow up in one week - mepilex border dressing every other day  Boyd Kerbs, DO 09/17/2019, 2:13 PM

## 2019-09-18 ENCOUNTER — Telehealth: Payer: Self-pay | Admitting: *Deleted

## 2019-09-18 NOTE — Telephone Encounter (Signed)
Faxed order to [facility] for supplies for the patient.  Confirmation received.  Copy scanned into the chart.//AB/CMA

## 2019-09-19 ENCOUNTER — Telehealth: Payer: Self-pay

## 2019-09-19 NOTE — Telephone Encounter (Signed)
Patient called to state insurance will not cover mesh. She would like to call back to discuss alternative wound care options.

## 2019-09-19 NOTE — Telephone Encounter (Addendum)
Called and spoke with the patient regarding the message below and informed her that Dr. Heber Wisdom was not in the office today, but I spoke with [employer] and she said she can use gauze and tape.  Patient verbalized understanding and agreed.    Patient stated that Dr. Heber Erwin mentioned her going back on the [facility].    Informed the patient that I will speak with Dr. Heber [place] on Monday regarding the [facility], and give her a call back on Monday.  Patient verbalized understanding and agreed.//AB/CMA

## 2019-09-22 NOTE — Telephone Encounter (Signed)
Informed the patient of the message below from Dr. Heber Mize.  Patient verbalized understanding and agreed,and stated that she still has the Adaptic and she will continue to use it.//AB/CMA

## 2019-09-22 NOTE — Telephone Encounter (Signed)
Per patient she finished santyl and I did not reorder at this time.  Treatment plan is for mepilex foam boarder dressings every 48 hrs.  If supplies through [employer] have not arrived can do vaseline followed by non stick pad and tape down.

## 2019-09-25 ENCOUNTER — Ambulatory Visit (INDEPENDENT_AMBULATORY_CARE_PROVIDER_SITE_OTHER): Payer: Medicare Other | Admitting: Internal Medicine

## 2019-09-25 ENCOUNTER — Encounter: Payer: Self-pay | Admitting: Internal Medicine

## 2019-09-25 ENCOUNTER — Other Ambulatory Visit: Payer: Self-pay

## 2019-09-25 ENCOUNTER — Other Ambulatory Visit (HOSPITAL_COMMUNITY): Payer: Self-pay | Admitting: Otolaryngology

## 2019-09-25 VITALS — BP 144/73 | HR 72 | Temp 97.8°F | Ht 61.0 in | Wt 125.0 lb

## 2019-09-25 DIAGNOSIS — J387 Other diseases of larynx: Secondary | ICD-10-CM

## 2019-09-25 DIAGNOSIS — S81811D Laceration without foreign body, right lower leg, subsequent encounter: Secondary | ICD-10-CM

## 2019-09-25 DIAGNOSIS — R070 Pain in throat: Secondary | ICD-10-CM

## 2019-09-25 MED ORDER — COLLAGENASE 250 UNIT/GM EX OINT: 1.0000 "application " | TOPICAL_OINTMENT | Freq: Every day | CUTANEOUS | 0 refills | Status: DC

## 2019-09-25 NOTE — Progress Notes (Signed)
   Subjective:     Patient ID: Anita Pollard, female    DOB: [birth year], 79 y.o.   MRN: [id number]  Chief Complaint  Patient presents with  . Right leg wound    HPI: The patient is a 79 y.o. female here for follow-up ofskin tear of the right lower leg following trauma  Patient has been doing adaptic and vaseline with dressing changes daily.  She reports improvement in the wound appearance.  She now has low drainage noted when doing dressing changes.  She denies acute pain, purulent drainage,  increased warmth or erythema to the wound.  Insurance would not cover foam dressings and she wanted to know today how many to purchase since it would be out of pocket.  Review of Systems  All other systems reviewed and are negative.    has a past medical history of Alcohol abuse, Anemia, Arthritis, Breast cancer ([place]) (2010), Colon polyp, Depression, Diabetes mellitus, GERD (gastroesophageal reflux disease), HTN (hypertension), Hypothyroidism, Osteopenia, Pancreatitis, and Ulcerative esophagitis.  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  reports that she quit smoking about 36 years ago. She has never used smokeless tobacco. Objective:   Vital Signs BP (!) 144/73 (BP Location: Left Arm, Patient Position: Sitting, Cuff Size: Normal)   Pulse 72   Temp 97.8 F (36.6 C) (Temporal)   Ht 5\' 1"  (1.549 m)   Wt 125 lb (56.7 kg)   SpO2 100%   BMI 23.62 kg/m  Vital Signs and Nursing Note Reviewed Physical Exam Skin:    Comments: Right leg wound measures:  2.0 x 1.5 x 0.1 cm sloughing noted Epithelialized tissue present Granulation tissue present          Assessment/Plan:     ICD-10-CM   1. Skin tear of  right lower leg without complication, subsequent encounter  S81.811D    Assessment:  Skin tear of right lower leg following trauma  The wound continues to heal nicely with no signs of infection today.  There is still an open area with sloughing noted.  Since patient is now reporting low drainage I will hold off on a foam dressing as the primary dressing.  I do think santyl would still be beneficial and recommended she re-start it with adaptic daily dressing changes.   Plan -in office mepilex border dressing  -santyl and adaptic dressing changes daily -follow up in one week  -vaseline and adaptic in the meantime, while she is able to pick up santyl    Boyd Kerbs, DO 09/25/2019, 1:48 PM

## 2019-10-02 ENCOUNTER — Ambulatory Visit (INDEPENDENT_AMBULATORY_CARE_PROVIDER_SITE_OTHER): Payer: Medicare Other | Admitting: Internal Medicine

## 2019-10-02 ENCOUNTER — Other Ambulatory Visit: Payer: Self-pay

## 2019-10-02 ENCOUNTER — Encounter: Payer: Self-pay | Admitting: Internal Medicine

## 2019-10-02 VITALS — BP 150/76 | HR 71 | Temp 98.2°F

## 2019-10-02 DIAGNOSIS — S81811D Laceration without foreign body, right lower leg, subsequent encounter: Secondary | ICD-10-CM | POA: Diagnosis not present

## 2019-10-02 NOTE — Progress Notes (Signed)
   Subjective:     Patient ID: Anita Pollard, female    DOB: 1941/01/04, 79 y.o.   MRN: [id number]  Chief Complaint  Patient presents with  . Follow-up    HPI: The patient is a 79 y.o. female with history of type II diabetes on oral agents and bilateral renal transplant (2015)  here for follow-up ofskin tear of the right lower leg following trauma  Patient reports using santyl daily with dressing changes.  She reports continued improvement in wound appearance.  She denies acute pain, purulent drainage,  increased warmth or erythema to the wound.  She has no complaints today.  Review of Systems  All other systems reviewed and are negative.    has a past medical history of Alcohol abuse, Anemia, Arthritis, Breast cancer ([place]) (2010), Colon polyp, Depression, Diabetes mellitus, GERD (gastroesophageal reflux disease), HTN (hypertension), Hypothyroidism, Osteopenia, Pancreatitis, and Ulcerative esophagitis.  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  reports that she quit smoking about 36 years ago. She has never used smokeless tobacco. Objective:   Vital Signs BP (!) 150/76 (BP Location: Left Arm, Patient Position: Sitting, Cuff Size: Normal)   Pulse 71   Temp 98.2 F (36.8 C) (Temporal)   SpO2 97%  Vital Signs and Nursing Note Reviewed Physical Exam Skin:    Comments: Right leg wound measures:  1.5 x 1.5 x 0.1 cm sloughing noted Epithelialized tissue present Granulation tissue present          Assessment/Plan:     ICD-10-CM   1. Skin tear of right lower leg without complication, subsequent encounter  S81.811D    Assessment:Skin tear ofrightlower leg following trauma  The wound  continues to heal nicely with no signs of infection today.  There is still an open area with sloughing noted.  I recommended continuing using santyl with dressing changes daily.  Will see her back in 2 weeks.  Plan -in office mepilex boarder dressing -santyl daily with dressing changes - follow up in 2 weeks  Boyd Kerbs, DO 10/02/2019, 1:41 PM

## 2019-10-03 ENCOUNTER — Other Ambulatory Visit: Payer: Self-pay | Admitting: Otolaryngology

## 2019-10-03 DIAGNOSIS — J387 Other diseases of larynx: Secondary | ICD-10-CM

## 2019-10-03 DIAGNOSIS — R070 Pain in throat: Secondary | ICD-10-CM

## 2019-10-06 ENCOUNTER — Other Ambulatory Visit: Payer: Self-pay | Admitting: Otolaryngology

## 2019-10-08 ENCOUNTER — Ambulatory Visit
Admission: RE | Admit: 2019-10-08 | Discharge: 2019-10-08 | Disposition: A | Discharge status: Home / Self Care | Payer: Medicare Other | Source: Ambulatory Visit | Attending: Otolaryngology | Admitting: Otolaryngology

## 2019-10-08 DIAGNOSIS — J387 Other diseases of larynx: Secondary | ICD-10-CM

## 2019-10-08 DIAGNOSIS — R070 Pain in throat: Secondary | ICD-10-CM

## 2019-10-08 MED ORDER — IOPAMIDOL (ISOVUE-300) INJECTION 61%
75.0000 mL | Freq: Once | INTRAVENOUS | Status: AC | PRN
  Administered 2019-10-08: 75 mL via INTRAVENOUS

## 2019-10-16 ENCOUNTER — Inpatient Hospital Stay (HOSPITAL_COMMUNITY): Admission: RE | Admit: 2019-10-16 | Payer: Medicare Other | Source: Ambulatory Visit

## 2019-10-16 ENCOUNTER — Ambulatory Visit (INDEPENDENT_AMBULATORY_CARE_PROVIDER_SITE_OTHER): Payer: Medicare Other | Admitting: Internal Medicine

## 2019-10-16 ENCOUNTER — Encounter (HOSPITAL_COMMUNITY)
Admission: RE | Admit: 2019-10-16 | Discharge: 2019-10-16 | Disposition: A | Discharge status: Home / Self Care | Payer: Medicare Other | Source: Ambulatory Visit | Attending: Otolaryngology | Admitting: Otolaryngology

## 2019-10-16 ENCOUNTER — Encounter (HOSPITAL_COMMUNITY): Payer: Self-pay

## 2019-10-16 ENCOUNTER — Other Ambulatory Visit: Payer: Self-pay

## 2019-10-16 ENCOUNTER — Encounter: Payer: Self-pay | Admitting: Internal Medicine

## 2019-10-16 VITALS — BP 142/89 | HR 86 | Temp 98.1°F

## 2019-10-16 DIAGNOSIS — R918 Other nonspecific abnormal finding of lung field: Secondary | ICD-10-CM | POA: Insufficient documentation

## 2019-10-16 DIAGNOSIS — E1122 Type 2 diabetes mellitus with diabetic chronic kidney disease: Secondary | ICD-10-CM | POA: Diagnosis not present

## 2019-10-16 DIAGNOSIS — K219 Gastro-esophageal reflux disease without esophagitis: Secondary | ICD-10-CM | POA: Insufficient documentation

## 2019-10-16 DIAGNOSIS — M1990 Unspecified osteoarthritis, unspecified site: Secondary | ICD-10-CM | POA: Diagnosis not present

## 2019-10-16 DIAGNOSIS — E890 Postprocedural hypothyroidism: Secondary | ICD-10-CM | POA: Diagnosis not present

## 2019-10-16 DIAGNOSIS — Z9841 Cataract extraction status, right eye: Secondary | ICD-10-CM | POA: Diagnosis not present

## 2019-10-16 DIAGNOSIS — I120 Hypertensive chronic kidney disease with stage 5 chronic kidney disease or end stage renal disease: Secondary | ICD-10-CM | POA: Insufficient documentation

## 2019-10-16 DIAGNOSIS — Z01818 Encounter for other preprocedural examination: Secondary | ICD-10-CM | POA: Diagnosis present

## 2019-10-16 DIAGNOSIS — Z7952 Long term (current) use of systemic steroids: Secondary | ICD-10-CM | POA: Diagnosis not present

## 2019-10-16 DIAGNOSIS — Z90710 Acquired absence of both cervix and uterus: Secondary | ICD-10-CM | POA: Insufficient documentation

## 2019-10-16 DIAGNOSIS — K9289 Other specified diseases of the digestive system: Secondary | ICD-10-CM | POA: Insufficient documentation

## 2019-10-16 DIAGNOSIS — Z79899 Other long term (current) drug therapy: Secondary | ICD-10-CM | POA: Diagnosis not present

## 2019-10-16 DIAGNOSIS — Z87442 Personal history of urinary calculi: Secondary | ICD-10-CM | POA: Diagnosis not present

## 2019-10-16 DIAGNOSIS — Z7984 Long term (current) use of oral hypoglycemic drugs: Secondary | ICD-10-CM | POA: Insufficient documentation

## 2019-10-16 DIAGNOSIS — Z86010 Personal history of colonic polyps: Secondary | ICD-10-CM | POA: Diagnosis not present

## 2019-10-16 DIAGNOSIS — M8580 Other specified disorders of bone density and structure, unspecified site: Secondary | ICD-10-CM | POA: Insufficient documentation

## 2019-10-16 DIAGNOSIS — D649 Anemia, unspecified: Secondary | ICD-10-CM | POA: Diagnosis not present

## 2019-10-16 DIAGNOSIS — K859 Acute pancreatitis without necrosis or infection, unspecified: Secondary | ICD-10-CM | POA: Insufficient documentation

## 2019-10-16 DIAGNOSIS — Z940 Kidney transplant status: Secondary | ICD-10-CM | POA: Insufficient documentation

## 2019-10-16 DIAGNOSIS — Z853 Personal history of malignant neoplasm of breast: Secondary | ICD-10-CM | POA: Diagnosis not present

## 2019-10-16 DIAGNOSIS — K2210 Ulcer of esophagus without bleeding: Secondary | ICD-10-CM | POA: Diagnosis not present

## 2019-10-16 DIAGNOSIS — N186 End stage renal disease: Secondary | ICD-10-CM | POA: Diagnosis not present

## 2019-10-16 DIAGNOSIS — S81811D Laceration without foreign body, right lower leg, subsequent encounter: Secondary | ICD-10-CM | POA: Diagnosis not present

## 2019-10-16 DIAGNOSIS — Z9842 Cataract extraction status, left eye: Secondary | ICD-10-CM | POA: Insufficient documentation

## 2019-10-16 DIAGNOSIS — I6523 Occlusion and stenosis of bilateral carotid arteries: Secondary | ICD-10-CM | POA: Insufficient documentation

## 2019-10-16 LAB — HEMOGLOBIN A1C
Hgb A1c MFr Bld: 7.5 % — ABNORMAL HIGH (ref 4.8–5.6)
Mean Plasma Glucose: 168.55 mg/dL

## 2019-10-16 NOTE — Progress Notes (Signed)
° °  Subjective:     Patient ID: Anita Pollard, female    DOB: 09/06/1940, 79 y.o.   MRN: [id number]  No chief complaint on file.   HPI: The patient is a 79 y.o. female with history of type II diabetes on oral agents and bilateral renal transplant (2015) here for follow-up ofskin tear of the right lower leg following trauma.  Patient continues to use Santyl daily with dressing changes.  She reports continued improvement in wound appearance and size.  She deniesacute pain,purulent drainage, increased warmth or erythema to the wound.  She has no complaints today.    Review of Systems  All other systems reviewed and are negative.    has a past medical history of Alcohol abuse, Anemia, Arthritis, Breast cancer ([place]) (2010), Colon polyp, Depression, Diabetes mellitus, GERD (gastroesophageal reflux disease), HTN (hypertension), Hypothyroidism, Osteopenia, Pancreatitis, and Ulcerative esophagitis.  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  reports that she quit smoking about 36 years ago. She has never used smokeless tobacco. Objective:   Vital Signs BP (!) 142/89 (BP Location: Left Arm, Patient Position: Sitting, Cuff Size: Normal)    Pulse 86    Temp 98.1 F (36.7 C) (Temporal)    SpO2 96%  Vital Signs and Nursing Note Reviewed Physical Exam Skin:    Comments: Right leg wound measures:  0.3 x 0.3 x 0.1 cm Epithelialized tissue present Granulation tissue present            Assessment/Plan:     ICD-10-CM   1. Skin tear of right lower leg without complication, subsequent encounter  S81.811D    Assessment:Skin tear ofrightlower leg following trauma  The wound continues to heal very nicely with  no signs of infection today.  There is a small open wound now present but this should heal and close in the next week.  She can stop using Santyl and use Vaseline with a Band-Aid daily until the wound is closed. She would like to follow-up as needed.  Plan -Can use Vaseline on the wound as needed, covered with a Band-Aid -Follow-up as needed  Boyd Kerbs, DO 10/16/2019, 1:07 PM

## 2019-10-16 NOTE — Progress Notes (Addendum)
Your procedure is scheduled on Monday, July 26th.  Report to [facility] Main Entrance "A" at 9:30 A.M., and check in at the Admitting office.  Call this number if you have problems the morning of surgery:  [phone]  Call [phone] if you have any questions prior to your surgery date Monday-Friday 8am-4pm   Remember:  Do not eat or drink after midnight the night before your surgery    Take these medicines the morning of surgery with A SIP OF WATER  levothyroxine (SYNTHROID) mycophenolate (MYFORTIC)  predniSONE (DELTASONE) rosuvastatin (CRESTOR)  tacrolimus (PROGRAF)   If needed - ondansetron (ZOFRAN)  As of today, STOP taking any Aspirin (unless otherwise instructed by your surgeon) Aleve, Naproxen, Ibuprofen, Motrin, Advil, Goody's, BC's, all herbal medications, fish oil, and all vitamins.         WHAT DO I DO ABOUT MY DIABETES MEDICATION?  ---The morning of surgery--- Do NOT take glipiZIDE (GLUCOTROL XL)  HOW TO MANAGE YOUR DIABETES BEFORE AND AFTER SURGERY  Why is it important to control my blood sugar before and after surgery? . Improving blood sugar levels before and after surgery helps healing and can limit problems. . A way of improving blood sugar control is eating a healthy diet by: o  Eating less sugar and carbohydrates o  Increasing activity/exercise o  Talking with your doctor about reaching your blood sugar goals . High blood sugars (greater than 180 mg/dL) can raise your risk of infections and slow your recovery, so you will need to focus on controlling your diabetes during the weeks before surgery. . Make sure that the doctor who takes care of your diabetes knows about your planned surgery including the date and location.  How do I manage my blood sugar before surgery? . Check your blood sugar at least 4 times a day, starting 2 days before surgery, to make sure that the level is not too high or low. . Check your blood sugar the morning of your surgery  when you wake up and every 2 hours until you get to the Short Stay unit. o If your blood sugar is less than 70 mg/dL, you will need to treat for low blood sugar: - Do not take insulin. - Treat a low blood sugar (less than 70 mg/dL) with  cup of clear juice (cranberry or apple), 4 glucose tablets, OR glucose gel. - Recheck blood sugar in 15 minutes after treatment (to make sure it is greater than 70 mg/dL). If your blood sugar is not greater than 70 mg/dL on recheck, call [phone] for further instructions. . Report your blood sugar to the short stay nurse when you get to Short Stay.  . If you are admitted to the hospital after surgery: o Your blood sugar will be checked by the staff and you will probably be given insulin after surgery (instead of oral diabetes medicines) to make sure you have good blood sugar levels. o The goal for blood sugar control after surgery is 80-180 mg/dL.             Do not wear jewelry, make up, or nail polish            Do not wear lotions, powders, perfumes,or deodorant.            Do not shave 48 hours prior to surgery.             Do not bring valuables to the hospital.  [facility] is not responsible for any belongings or valuables.  Do NOT Smoke (Tobacco/Vaping) or drink Alcohol 24 hours prior to your procedure If you use a CPAP at night, you may bring all equipment for your overnight stay.   Contacts, glasses, dentures or bridgework may not be worn into surgery.      For patients admitted to the hospital, discharge time will be determined by your treatment team.   Patients discharged the day of surgery will not be allowed to drive home, and someone needs to stay with them for 24 hours.  Special instructions:   [facility]- Preparing For Surgery  Before surgery, you can play an important role. Because skin is not sterile, your skin needs to be as free of germs as possible. You can reduce the number of germs on your skin by washing with CHG  (chlorahexidine gluconate) Soap before surgery.  CHG is an antiseptic cleaner which kills germs and bonds with the skin to continue killing germs even after washing.    Oral Hygiene is also important to reduce your risk of infection.  Remember - BRUSH YOUR TEETH THE MORNING OF SURGERY WITH YOUR REGULAR TOOTHPASTE  Please do not use if you have an allergy to CHG or antibacterial soaps. If your skin becomes reddened/irritated stop using the CHG.  Do not shave (including legs and underarms) for at least 48 hours prior to first CHG shower. It is OK to shave your face.  Please follow these instructions carefully.   1. Shower the NIGHT BEFORE SURGERY and the MORNING OF SURGERY with CHG Soap.   2. If you chose to wash your hair, wash your hair first as usual with your normal shampoo.  3. After you shampoo, rinse your hair and body thoroughly to remove the shampoo.  4. Use CHG as you would any other liquid soap. You can apply CHG directly to the skin and wash gently with a scrungie or a clean washcloth.   5. Apply the CHG Soap to your body ONLY FROM THE NECK DOWN.  Do not use on open wounds or open sores. Avoid contact with your eyes, ears, mouth and genitals (private parts). Wash Face and genitals (private parts)  with your normal soap.   6. Wash thoroughly, paying special attention to the area where your surgery will be performed.  7. Thoroughly rinse your body with warm water from the neck down.  8. DO NOT shower/wash with your normal soap after using and rinsing off the CHG Soap.  9. Pat yourself dry with a CLEAN TOWEL.  10. Wear CLEAN PAJAMAS to bed the night before surgery  11. Place CLEAN SHEETS on your bed the night of your first shower and DO NOT SLEEP WITH PETS.  Day of Surgery: Wear Clean/Comfortable clothing the morning of surgery Do not apply any deodorants/lotions.   Remember to brush your teeth WITH YOUR REGULAR TOOTHPASTE.   Please read over the following fact sheets that  you were given.

## 2019-10-16 NOTE — Progress Notes (Signed)
PCP - Dr. Lawerance Cruel Cardiologist - Dr. Ottie Glazier Nephrologist - Dr. Shea Stakes  PPM/ICD - denies  Chest x-ray - N/A EKG - 10/16/2019 Stress Test - per patient had one done before kidney transplant but said it was normal ECHO - 05/08/2018 Cardiac Cath - denies  Sleep Study - denies  Fasting Blood Sugar - 80 - 190 Checks Blood Sugar 1 time/day  Blood Thinner Instructions: N/A Aspirin Instructions: N/A  ERAS Protcol - N/A  COVID TEST: Scheduled for 10/17/2019. Patient verbalized understanding of self-quarantine instructions appointment time and place.  Anesthesia review: YES, kidney transplant, A1c, CBG @ PAT 292, patient stated she had just eaten a brownie. Patient made aware that if CBG is elevated the day of surgery, there is a possibility that surgery will be cancelled. Patient verbalized understanding.  Patient denies shortness of breath, fever, cough and chest pain at PAT appointment  All instructions explained to the patient, with a verbal understanding of the material. Patient agrees to go over the instructions while at home for a better understanding. Patient also instructed to self quarantine after being tested for COVID-19. The opportunity to ask questions was provided.

## 2019-10-17 ENCOUNTER — Other Ambulatory Visit (HOSPITAL_COMMUNITY)
Admission: RE | Admit: 2019-10-17 | Discharge: 2019-10-17 | Disposition: A | Discharge status: Home / Self Care | Payer: Medicare Other | Source: Ambulatory Visit | Attending: Otolaryngology | Admitting: Otolaryngology

## 2019-10-17 DIAGNOSIS — Z20822 Contact with and (suspected) exposure to covid-19: Secondary | ICD-10-CM | POA: Diagnosis not present

## 2019-10-17 DIAGNOSIS — Z01812 Encounter for preprocedural laboratory examination: Secondary | ICD-10-CM | POA: Insufficient documentation

## 2019-10-17 LAB — SARS CORONAVIRUS 2 (TAT 6-24 HRS): SARS Coronavirus 2: NEGATIVE

## 2019-10-17 NOTE — Anesthesia Preprocedure Evaluation (Addendum)
Anesthesia Evaluation  Patient identified by MRN, date of birth, ID band Patient awake    Reviewed: Allergy & Precautions, NPO status , Patient's Chart, lab work & pertinent test results  Airway Mallampati: II  TM Distance: >3 FB Neck ROM: Full    Dental  (+) Teeth Intact, Dental Advisory Given   Pulmonary former smoker,    breath sounds clear to auscultation       Cardiovascular hypertension,  Rhythm:Regular Rate:Normal     Neuro/Psych    GI/Hepatic   Endo/Other  diabetes  Renal/GU      Musculoskeletal   Abdominal   Peds  Hematology   Anesthesia Other Findings   Reproductive/Obstetrics                          Anesthesia Physical Anesthesia Plan  ASA: III  Anesthesia Plan: General   Post-op Pain Management:    Induction: Intravenous  PONV Risk Score and Plan: Ondansetron and Dexamethasone  Airway Management Planned: Oral ETT  Additional Equipment:   Intra-op Plan:   Post-operative Plan: Extubation in OR  Informed Consent: I have reviewed the patients History and Physical, chart, labs and discussed the procedure including the risks, benefits and alternatives for the proposed anesthesia with the patient or authorized representative who has indicated his/her understanding and acceptance.     Dental advisory given  Plan Discussed with: CRNA and Anesthesiologist  Anesthesia Plan Comments: (PAT note written 10/17/2019 by Myra Gianotti, PA-C. History renal transplant. CBC and CMET results are in Care Everywhere. )      Anesthesia Quick Evaluation

## 2019-10-17 NOTE — Progress Notes (Signed)
Anesthesia Chart Review:  Case: [id number] Date/Time: 10/20/19 1115   Procedures:      MICRO DIRECT LARYNGOSCOPY CO2 LASER (N/A )     RIGID ESOPHAGOSCOPY W/FROZEN SECTION (N/A )   Anesthesia type: General   Pre-op diagnosis: SUPRAGLOTTIC MASS   Location: [facility] OR ROOM 08 / [place] OR   Surgeons: Melida Quitter, MD      DISCUSSION: Patient is an 79 year old female scheduled for the above procedure.  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  Labs from PAT and from 10/07/19 through [facility] reviewed. Creatinine 1.07.   Presurgical COVID-19 test on 10/17/19 is in process.  Anesthesia team to evaluate on the day of surgery.   VS: BP (!) 111/64   Pulse 81   Temp 36.9 C (Oral)   Resp 18   Ht 5\' 1"  (1.549 m)   Wt 57.8 kg   SpO2 96%   BMI 24.08 kg/m    PROVIDERS: Lawerance Cruel, MD is PCP Shea Stakes, MD is transplant nephrologist ([place]) Ena Dawley, MD is cardiologist. Last evaluation 05/14/19 for murmur. Echo in 2020 did not show any significant valvular abnormalities.    LABS: Labs reviewed: Acceptable for surgery. (all labs ordered are listed, but only abnormal results are displayed)  Labs Reviewed  HEMOGLOBIN A1C - Abnormal; Notable for the following components:      Result Value   Hgb A1c MFr Bld 7.5 (*)    All other components within normal limits   Labs done through [facility] (see Care Everywhere) on 10/07/2019 include CMP, magnesium, phosphorus, CBC with differential.  Results include sodium 135, potassium 4.2, chloride 103, BUN 21, glucose 197, creatinine 1.07, calcium 8.9, total protein 6.4, albumin 4.2, total bilirubin 0.4, alkaline phosphatase 60, AST 14, ALT 11,  magnesium 2.0, phosphorus 2.9, WBC 8.9, hemoglobin 11.7, hematocrit 34.5, platelet count 185.   IMAGES: CT soft tissue neck 10/08/19 (ordered by Dr. Redmond Baseman): IMPRESSION: 1. Nodular thickening at the tip of the epiglottis. 2. 7 mm ground-glass nodule in the right upper lobe. Initial follow-up with CT at 6-12 months is recommended to confirm persistence. If persistent, repeat CT is recommended every 2 years until 5 years of stability has been established. This recommendation follows the consensus statement: Guidelines for Management of Incidental Pulmonary Nodules Detected on CT Images: From the Fleischner Society 2017; Radiology 2017; 284:228-243.   EKG: 10/16/19: NSR   CV: Carotid US 05/16/18: Summary:  - Right Carotid: Velocities in the right ICA are consistent with a 1-39% stenosis. The RICA velocities remain within normal range and stable when compared to the prior exam.  - Left Carotid: Velocities in the left ICA are consistent with a 1-39% stenosis. Non-hemodynamically significant plaque noted in the CCA. The LICA velocities remain within normal range and have increased when compared to the prior exam.  - Vertebrals: Bilateral vertebral arteries demonstrate antegrade flow.  - Subclavians: Normal flow hemodynamics were seen in bilateral subclavian arteries.    Echo 05/08/18: IMPRESSIONS  1. The left ventricle has hyperdynamic systolic function of >48%. The  cavity size was normal. Left ventricular diastolic Doppler parameters are  consistent with impaired relaxation.  2. The right ventricle has normal systolic function. The cavity was  normal. There is no increase in right ventricular wall  thickness.  3. The mitral valve is normal in structure.  4. The tricuspid valve is normal in structure.  5. The aortic valve is normal in structure.  6. The pulmonic valve was normal in structure.  7. Right atrial pressure is estimated at 3 mmHg.   Stress Echo 07/11/13 [facility]  CE): STRESS ECHO  Normal left ventricular function and global wall motion with stress. There  were no segmental wall motion abnormalities post exercise. There was normal  increase in global LV function post exercise. Global LV function is preserved  with stress. The estimated LV ejection fraction is 65-70% with stress.  Negative exercise echocardiography for inducible ischemia at target heart  rate.    Past Medical History:  Diagnosis Date  . Alcohol abuse   . Anemia   . Arthritis   . Breast cancer ([place]) 2010   Right  . Colon polyp   . Depression   . Diabetes mellitus   . GERD (gastroesophageal reflux disease)   . History of kidney stones 1976  . HTN (hypertension)   . Hypothyroidism   . Osteopenia   . Pancreatitis   . Ulcerative esophagitis     Past Surgical History:  Procedure Laterality Date  . ABDOMINAL HYSTERECTOMY    . ABDOMINOPLASTY    . APPENDECTOMY    . BREAST BIOPSY Right 08/03/2008   Stereo Bx, Malignant  . BREAST LUMPECTOMY Right 08/26/2008  . CATARACT EXTRACTION W/ INTRAOCULAR LENS  IMPLANT, BILATERAL    . COSMETIC SURGERY    . ductal carcinoma     right breast   . HIP ARTHROPLASTY Right   . INSERTION OF DIALYSIS CATHETER Right 09/25/2012   Procedure: INSERTION OF DIALYSIS CATHETER;  Surgeon: Mal Misty, MD;  Location: [place];  Service: Vascular;  Laterality: Right;  Righ Internal Jugular Placement  . 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 THYROIDECTOMY    . TUMMY TUCK      MEDICATIONS: . amLODipine (NORVASC) 10 MG tablet  . Cholecalciferol (VITAMIN D3) 5000 UNITS TABS  . collagenase (SANTYL) ointment  . glipiZIDE (GLUCOTROL XL) 2.5 MG 24 hr tablet  . Lancets (FREESTYLE) lancets  . levothyroxine (SYNTHROID) 200 MCG tablet  . losartan (COZAAR) 25 MG tablet  . magnesium oxide (MAGOX 400) 400 (241.3 MG) MG tablet  . Melatonin 5 MG CAPS  . Misc Natural Products (GLUCOS-CHONDROIT-MSM COMPLEX PO)  . Multiple Vitamin (MULTIVITAMIN) tablet  .  mycophenolate (MYFORTIC) 180 MG EC tablet  . omeprazole (PRILOSEC) 20 MG capsule  . ondansetron (ZOFRAN) 4 MG tablet  . Pancrelipase, Lip-Prot-Amyl, (CREON) 3000-9500 units CPEP  . Polyethyl Glycol-Propyl Glycol (SYSTANE OP)  . potassium chloride (MICRO-K) 10 MEQ CR capsule  . pramipexole (MIRAPEX) 0.25 MG tablet  . predniSONE (DELTASONE) 5 MG tablet  . Probiotic Product (CULTURELLE PROBIOTICS PO)  . raloxifene (EVISTA) 60 MG tablet  . rosuvastatin (CRESTOR) 5 MG tablet  . sulfamethoxazole-trimethoprim (BACTRIM,SEPTRA) 400-80 MG tablet  . tacrolimus (PROGRAF) 0.5 MG capsule  . tacrolimus (PROGRAF) 1 MG capsule  . temazepam (RESTORIL) 15 MG capsule  . Turmeric (QC TUMERIC COMPLEX PO)  . vitamin B-12 (CYANOCOBALAMIN) 500 MCG tablet   No current facility-administered medications for this encounter.    Myra Gianotti, PA-C Surgical Short Stay/Anesthesiology [facility] Phone [phone] [facility] Phone [phone] 10/17/2019 1:21 PM

## 2019-10-20 ENCOUNTER — Other Ambulatory Visit: Payer: Self-pay

## 2019-10-20 ENCOUNTER — Ambulatory Visit (HOSPITAL_COMMUNITY)
Admission: RE | Admit: 2019-10-20 | Discharge: 2019-10-20 | Disposition: A | Discharge status: Home / Self Care | Payer: Medicare Other | Attending: Otolaryngology | Admitting: Otolaryngology

## 2019-10-20 ENCOUNTER — Encounter (HOSPITAL_COMMUNITY)
Admission: RE | Disposition: A | Discharge status: Home / Self Care | Payer: Self-pay | Source: Home / Self Care | Attending: Otolaryngology

## 2019-10-20 ENCOUNTER — Encounter (HOSPITAL_COMMUNITY): Payer: Self-pay | Admitting: Otolaryngology

## 2019-10-20 ENCOUNTER — Ambulatory Visit (HOSPITAL_COMMUNITY): Payer: Medicare Other | Admitting: Physician Assistant

## 2019-10-20 ENCOUNTER — Ambulatory Visit (HOSPITAL_COMMUNITY): Payer: Medicare Other | Admitting: Certified Registered"

## 2019-10-20 DIAGNOSIS — Z940 Kidney transplant status: Secondary | ICD-10-CM | POA: Diagnosis not present

## 2019-10-20 DIAGNOSIS — Z833 Family history of diabetes mellitus: Secondary | ICD-10-CM | POA: Diagnosis not present

## 2019-10-20 DIAGNOSIS — Z7952 Long term (current) use of systemic steroids: Secondary | ICD-10-CM | POA: Insufficient documentation

## 2019-10-20 DIAGNOSIS — K219 Gastro-esophageal reflux disease without esophagitis: Secondary | ICD-10-CM | POA: Insufficient documentation

## 2019-10-20 DIAGNOSIS — Z888 Allergy status to other drugs, medicaments and biological substances status: Secondary | ICD-10-CM | POA: Insufficient documentation

## 2019-10-20 DIAGNOSIS — M1990 Unspecified osteoarthritis, unspecified site: Secondary | ICD-10-CM | POA: Insufficient documentation

## 2019-10-20 DIAGNOSIS — Z91018 Allergy to other foods: Secondary | ICD-10-CM | POA: Diagnosis not present

## 2019-10-20 DIAGNOSIS — Z8249 Family history of ischemic heart disease and other diseases of the circulatory system: Secondary | ICD-10-CM | POA: Diagnosis not present

## 2019-10-20 DIAGNOSIS — Z880 Allergy status to penicillin: Secondary | ICD-10-CM | POA: Diagnosis not present

## 2019-10-20 DIAGNOSIS — E119 Type 2 diabetes mellitus without complications: Secondary | ICD-10-CM | POA: Diagnosis not present

## 2019-10-20 DIAGNOSIS — Z87891 Personal history of nicotine dependence: Secondary | ICD-10-CM | POA: Insufficient documentation

## 2019-10-20 DIAGNOSIS — E890 Postprocedural hypothyroidism: Secondary | ICD-10-CM | POA: Diagnosis not present

## 2019-10-20 DIAGNOSIS — Z853 Personal history of malignant neoplasm of breast: Secondary | ICD-10-CM | POA: Diagnosis not present

## 2019-10-20 DIAGNOSIS — Z79899 Other long term (current) drug therapy: Secondary | ICD-10-CM | POA: Diagnosis not present

## 2019-10-20 DIAGNOSIS — Z7984 Long term (current) use of oral hypoglycemic drugs: Secondary | ICD-10-CM | POA: Insufficient documentation

## 2019-10-20 DIAGNOSIS — I10 Essential (primary) hypertension: Secondary | ICD-10-CM | POA: Diagnosis not present

## 2019-10-20 DIAGNOSIS — M8580 Other specified disorders of bone density and structure, unspecified site: Secondary | ICD-10-CM | POA: Diagnosis not present

## 2019-10-20 DIAGNOSIS — J387 Other diseases of larynx: Secondary | ICD-10-CM | POA: Diagnosis present

## 2019-10-20 HISTORY — PX: RIGID ESOPHAGOSCOPY: SHX5226

## 2019-10-20 HISTORY — PX: MICROLARYNGOSCOPY: SHX5208

## 2019-10-20 LAB — GLUCOSE, CAPILLARY
Glucose-Capillary: 167 mg/dL — ABNORMAL HIGH (ref 70–99)
Glucose-Capillary: 143 mg/dL — ABNORMAL HIGH (ref 70–99)
Glucose-Capillary: 162 mg/dL — ABNORMAL HIGH (ref 70–99)

## 2019-10-20 SURGERY — MICROLARYNGOSCOPY
Anesthesia: General | Site: Mouth

## 2019-10-20 MED ORDER — OXYCODONE HCL 5 MG/5ML PO SOLN: 5.0000 mg | Freq: Once | ORAL | Status: DC | PRN

## 2019-10-20 MED ORDER — EPINEPHRINE HCL (NASAL) 0.1 % NA SOLN
NASAL | Status: DC | PRN
  Administered 2019-10-20: 1 [drp] via NASAL

## 2019-10-20 MED ORDER — LIDOCAINE HCL (CARDIAC) PF 100 MG/5ML IV SOSY
PREFILLED_SYRINGE | INTRAVENOUS | Status: DC | PRN
  Administered 2019-10-20: 30 mg via INTRAVENOUS

## 2019-10-20 MED ORDER — FENTANYL CITRATE (PF) 250 MCG/5ML IJ SOLN
INTRAMUSCULAR | Status: AC
  Filled 2019-10-20: qty 5

## 2019-10-20 MED ORDER — ONDANSETRON HCL 4 MG/2ML IJ SOLN
INTRAMUSCULAR | Status: DC | PRN
  Administered 2019-10-20: 4 mg via INTRAVENOUS

## 2019-10-20 MED ORDER — PROPOFOL 10 MG/ML IV BOLUS
INTRAVENOUS | Status: DC | PRN
  Administered 2019-10-20: 130 mg via INTRAVENOUS

## 2019-10-20 MED ORDER — FENTANYL CITRATE (PF) 100 MCG/2ML IJ SOLN
INTRAMUSCULAR | Status: DC | PRN
  Administered 2019-10-20: 100 ug via INTRAVENOUS
  Administered 2019-10-20: 50 ug via INTRAVENOUS

## 2019-10-20 MED ORDER — EPINEPHRINE HCL (NASAL) 0.1 % NA SOLN
NASAL | Status: AC
  Filled 2019-10-20: qty 30

## 2019-10-20 MED ORDER — DEXAMETHASONE SODIUM PHOSPHATE 10 MG/ML IJ SOLN
INTRAMUSCULAR | Status: DC | PRN
Start: 2019-10-20 — End: 2019-10-20
  Administered 2019-10-20: 5 mg via INTRAVENOUS

## 2019-10-20 MED ORDER — SUGAMMADEX SODIUM 200 MG/2ML IV SOLN
INTRAVENOUS | Status: DC | PRN
Start: 2019-10-20 — End: 2019-10-20
  Administered 2019-10-20: 200 mg via INTRAVENOUS

## 2019-10-20 MED ORDER — OXYMETAZOLINE HCL 0.05 % NA SOLN
NASAL | Status: AC
  Filled 2019-10-20: qty 30

## 2019-10-20 MED ORDER — LACTATED RINGERS IV SOLN: INTRAVENOUS | Status: DC

## 2019-10-20 MED ORDER — CHLORHEXIDINE GLUCONATE 0.12 % MT SOLN
OROMUCOSAL | Status: AC
  Administered 2019-10-20: 15 mL via OROMUCOSAL
  Filled 2019-10-20: qty 15

## 2019-10-20 MED ORDER — ROCURONIUM BROMIDE 10 MG/ML (PF) SYRINGE
PREFILLED_SYRINGE | INTRAVENOUS | Status: DC | PRN
  Administered 2019-10-20: 50 mg via INTRAVENOUS

## 2019-10-20 MED ORDER — FENTANYL CITRATE (PF) 100 MCG/2ML IJ SOLN: 25.0000 ug | INTRAMUSCULAR | Status: DC | PRN

## 2019-10-20 MED ORDER — PROPOFOL 10 MG/ML IV BOLUS
INTRAVENOUS | Status: AC
  Filled 2019-10-20: qty 20

## 2019-10-20 MED ORDER — ORAL CARE MOUTH RINSE: 15.0000 mL | Freq: Once | OROMUCOSAL | Status: AC

## 2019-10-20 MED ORDER — CHLORHEXIDINE GLUCONATE 0.12 % MT SOLN: 15.0000 mL | Freq: Once | OROMUCOSAL | Status: AC

## 2019-10-20 MED ORDER — OXYCODONE HCL 5 MG PO TABS: 5.0000 mg | ORAL_TABLET | Freq: Once | ORAL | Status: DC | PRN

## 2019-10-20 MED ORDER — ONDANSETRON HCL 4 MG/2ML IJ SOLN: 4.0000 mg | Freq: Once | INTRAMUSCULAR | Status: DC | PRN

## 2019-10-20 SURGICAL SUPPLY — 36 items
BALLN PULM 15 16.5 18 X 75CM (BALLOONS)
BALLN PULM 15 16.5 18X75 (BALLOONS)
BALLOON PULM 15 16.5 18X75 (BALLOONS) IMPLANT
BLADE SURG 15 STRL LF DISP TIS (BLADE) IMPLANT
BLADE SURG 15 STRL SS (BLADE)
CANISTER SUCT 3000ML PPV (MISCELLANEOUS) ×4 IMPLANT
CNTNR URN SCR LID CUP LEK RST (MISCELLANEOUS) IMPLANT
CONT SPEC 4OZ STRL OR WHT (MISCELLANEOUS)
COVER BACK TABLE 60X90IN (DRAPES) ×4 IMPLANT
COVER MAYO STAND STRL (DRAPES) ×4 IMPLANT
COVER WAND RF STERILE (DRAPES) ×4 IMPLANT
DRAPE HALF SHEET 40X57 (DRAPES) ×4 IMPLANT
DRSG TELFA 3X8 NADH (GAUZE/BANDAGES/DRESSINGS) ×4 IMPLANT
GAUZE 4X4 16PLY RFD (DISPOSABLE) ×4 IMPLANT
GAUZE SPONGE 4X4 12PLY STRL (GAUZE/BANDAGES/DRESSINGS) ×4 IMPLANT
GLOVE BIO SURGEON STRL SZ7.5 (GLOVE) ×4 IMPLANT
GOWN STRL REUS W/ TWL LRG LVL3 (GOWN DISPOSABLE) ×4 IMPLANT
GOWN STRL REUS W/TWL LRG LVL3 (GOWN DISPOSABLE) ×8
GUARD TEETH (MISCELLANEOUS) ×4 IMPLANT
KIT BASIN OR (CUSTOM PROCEDURE TRAY) ×4 IMPLANT
KIT TURNOVER KIT B (KITS) ×4 IMPLANT
NDL HYPO 25GX1X1/2 BEV (NEEDLE) IMPLANT
NDL TRANS ORAL INJECTION (NEEDLE) IMPLANT
NEEDLE HYPO 25GX1X1/2 BEV (NEEDLE) IMPLANT
NEEDLE TRANS ORAL INJECTION (NEEDLE) IMPLANT
NS IRRIG 1000ML POUR BTL (IV SOLUTION) ×4 IMPLANT
PAD ARMBOARD 7.5X6 YLW CONV (MISCELLANEOUS) ×8 IMPLANT
PAD DRESSING TELFA 3X8 NADH (GAUZE/BANDAGES/DRESSINGS) IMPLANT
PATTIES SURGICAL .5 X3 (DISPOSABLE) ×3 IMPLANT
POSITIONER HEAD DONUT 9IN (MISCELLANEOUS) IMPLANT
SOL ANTI FOG 6CC (MISCELLANEOUS) ×2 IMPLANT
SOLUTION ANTI FOG 6CC (MISCELLANEOUS) ×2
SURGILUBE 2OZ TUBE FLIPTOP (MISCELLANEOUS) ×4 IMPLANT
TOWEL GREEN STERILE FF (TOWEL DISPOSABLE) ×8 IMPLANT
TUBE CONNECTING 12'X1/4 (SUCTIONS) ×1
TUBE CONNECTING 12X1/4 (SUCTIONS) ×3 IMPLANT

## 2019-10-20 NOTE — Op Note (Signed)
Preop diagnosis: Supraglottic mass Postop diagnosis: same Procedure: Suspended microdirect laryngoscopy with biopsy, rigid esophagoscopy Surgeon: Redmond Baseman Anesth: General endotracheal anesthesia Compl: None Indications: The patient is an 79 year old female with a history of throat pain since February.  She presents for surgical evaluation and biopsy. Findings: Mucosal irregularity of tip of epiglottis extending to either side somewhat with some bulk in the lingual side of the epiglottis with keratin expressible from a small spot.  Biopsies taken from left, right, and anterior epiglottis Description:  After discussing risks, benefits, and alternatives, the patient was brought to the operating room and placed on the operative table in the supine position.  Anesthesia was induced and the patient was intubated by the anesthesia team without difficulty.  The eyes were taped closed and the bed was turned 90 degrees from anesthesia.  Intravenous steroids were given.  Damp eye pads were taped in place and a tooth guard was placed over the upper teeth.  A Storz laryngoscope was inserted and used to view the various areas of the pharynx and larynx including the endolarynx, post-cricoid area, pyriform sinuses, and vallecula.  Findings are noted above.  The laryngoscope was removed and a cervical esophagoscope was then introduced and passed into the proximal esophagus.  It could not be inserted any further due to neck position.  The esophagoscope was removed and the laryngoscope reintroduced.  It was placed in position to view the epiglottis and placed in suspension on the Mayo stand using a suspension arm.  The rigid telescope was used to evaluate the epiglottis and a photo was made.  Biopsies were taken from the left, right, and anterior epiglottis and sent for permanent section.  Epinephrine-soaked pledgets were held against the site for a couple of minutes.  After completion, the laryngoscope was removed as the pharynx  was suctioned.  The patient was then turned back to anesthesia for wake up and was extubated and moved to the recovery room in stable condition.

## 2019-10-20 NOTE — H&P (Signed)
Anita Pollard is an 79 y.o. female.   Chief Complaint: Throat pain HPI: 79 year old female with right-sided throat pain since February.  She was found to have a small mass on the right aryepiglottic fold in the office and presents for endoscopy and removal/biopsy.  Past Medical History:  Diagnosis Date  . Alcohol abuse   . Anemia   . Arthritis   . Breast cancer ([place]) 2010   Right  . Colon polyp   . Depression   . Diabetes mellitus   . GERD (gastroesophageal reflux disease)   . History of kidney stones 1976  . HTN (hypertension)   . Hypothyroidism   . Osteopenia   . Pancreatitis   . Ulcerative esophagitis     Past Surgical History:  Procedure Laterality Date  . ABDOMINAL HYSTERECTOMY    . ABDOMINOPLASTY    . APPENDECTOMY    . BREAST BIOPSY Right 08/03/2008   Stereo Bx, Malignant  . BREAST LUMPECTOMY Right 08/26/2008  . CATARACT EXTRACTION W/ INTRAOCULAR LENS  IMPLANT, BILATERAL    . COSMETIC SURGERY    . ductal carcinoma     right breast   . HIP ARTHROPLASTY Right   . INSERTION OF DIALYSIS CATHETER Right 09/25/2012   Procedure: INSERTION OF DIALYSIS CATHETER;  Surgeon: Mal Misty, MD;  Location: [facility];  Service: Vascular;  Laterality: Right;  Righ Internal Jugular Placement  . 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 THYROIDECTOMY    . TUMMY TUCK      Family History  Problem Relation Age of Onset  . Thyroid cancer Daughter 82  . Diabetes Father   . Hypertension Father   . Diabetes Brother   . Hypertension Mother   . Hypertension Sister   . Colon polyps Brother   . Atrial fibrillation Brother   . Dementia Sister   . Multiple myeloma Sister    Social History:  reports that she quit smoking about 36 years ago. She has never used smokeless tobacco. She reports current alcohol use. She reports that she does not use drugs.  Allergies:  Allergies  Allergen Reactions  . Penicillins Itching and Swelling    Swelling and flushing Has patient had a PCN reaction  causing immediate rash, facial/tongue/throat swelling, SOB or lightheadedness with hypotension: yes Has patient had a PCN reaction causing severe rash involving mucus membranes or skin necrosis: no Has patient had a PCN reaction that required hospitalization: no Has patient had a PCN reaction occurring within the last 10 years: no If all of the above answers are "NO", then may proceed with Cephalosporin use.  . Banana Nausea Only    Green bananas   . Detrol [Tolterodine]     Leg cramps  . Quetiapine Other (See Comments)    Leg cramps/restless leg  . Risperidone And Related     Unknown reaction  . Lisinopril Cough    Medications Prior to Admission  Medication Sig Dispense Refill  . amLODipine (NORVASC) 10 MG tablet Take 10 mg by mouth at bedtime.    . Cholecalciferol (VITAMIN D3) 5000 UNITS TABS Take 5,000 Units by mouth 3 (three) times daily after meals.     . collagenase (SANTYL) ointment Apply 1 application topically daily. 30 g 0  . glipiZIDE (GLUCOTROL XL) 2.5 MG 24 hr tablet Take 5 mg by mouth 2 (two) times daily.     Marland Kitchen levothyroxine (SYNTHROID) 200 MCG tablet Take 200 mcg by mouth See admin instructions. Take 200 mcg at 3 am  daily    . losartan (COZAAR) 25 MG tablet Take 25 mg by mouth at bedtime.     . magnesium oxide (MAGOX 400) 400 (241.3 MG) MG tablet Take 400 mg by mouth 3 (three) times daily.     . Melatonin 5 MG CAPS Take 5 mg by mouth See admin instructions. Take 5 mg 2 hours before bedtime    . Misc Natural Products (GLUCOS-CHONDROIT-MSM COMPLEX PO) Take 1 tablet by mouth 3 (three) times daily.    . Multiple Vitamin (MULTIVITAMIN) tablet Take 1 tablet by mouth daily.    . mycophenolate (MYFORTIC) 180 MG EC tablet Take 180 mg by mouth every 12 (twelve) hours.     Marland Kitchen omeprazole (PRILOSEC) 20 MG capsule Take 20 mg by mouth daily.    . Pancrelipase, Lip-Prot-Amyl, (CREON) 3000-9500 units CPEP Take 1 capsule by mouth 3 (three) times daily with meals.     Vladimir Faster  Glycol-Propyl Glycol (SYSTANE OP) Place 1 drop into both eyes every evening.    . potassium chloride (MICRO-K) 10 MEQ CR capsule Take 10-20 mEq by mouth See admin instructions. Take 10 meq in the morning, 20 meq in the afternoon, and 10 meq in the evening on Tue, Thurs, and Sat. Take 10 meq 3 times daily on Sun, Mon, Wed, and Fri.    . pramipexole (MIRAPEX) 0.25 MG tablet Take 0.25-0.5 mg by mouth See admin instructions. Take 0.5 mg 2 hours prior to bedtime then 0.25 mg at bedtime    . predniSONE (DELTASONE) 5 MG tablet Take 5 mg by mouth daily with breakfast.     . Probiotic Product (CULTURELLE PROBIOTICS PO) Take 1 capsule by mouth at bedtime.    . raloxifene (EVISTA) 60 MG tablet Take 60 mg by mouth every evening.     . rosuvastatin (CRESTOR) 5 MG tablet Take 1 tablet (5 mg total) by mouth 2 (two) times a week. (Patient taking differently: Take 5 mg by mouth 2 (two) times a week. Mondays and Fridays) 45 tablet 3  . sulfamethoxazole-trimethoprim (BACTRIM,SEPTRA) 400-80 MG tablet Take 1 tablet by mouth every Monday, Wednesday, and Friday.     . tacrolimus (PROGRAF) 0.5 MG capsule Take 0.5 mg by mouth every 12 (twelve) hours. Take with the 1 mg to equal 1.5 mg every 12 hours    . tacrolimus (PROGRAF) 1 MG capsule Take 1 mg by mouth every 12 (twelve) hours. Take with the 0.5 mg to equal 1.5 mg every 12 hours    . temazepam (RESTORIL) 15 MG capsule Take 15-30 mg by mouth at bedtime.     . Turmeric (QC TUMERIC COMPLEX PO) Take 750 mg by mouth 2 (two) times daily.     . vitamin B-12 (CYANOCOBALAMIN) 500 MCG tablet Take 500 mcg by mouth daily.    . Lancets (FREESTYLE) lancets by Does not apply route.    . ondansetron (ZOFRAN) 4 MG tablet Take 4 mg by mouth every 8 (eight) hours as needed for nausea/vomiting.      Results for orders placed or performed during the hospital encounter of 10/20/19 (from the past 48 hour(s))  Glucose, capillary     Status: Abnormal   Collection Time: 10/20/19  9:22 AM   Result Value Ref Range   Glucose-Capillary 167 (H) 70 - 99 mg/dL    Comment: Glucose reference range applies only to samples taken after fasting for at least 8 hours.  Glucose, capillary     Status: Abnormal   Collection Time: 10/20/19 11:30 AM  Result Value Ref Range   Glucose-Capillary 143 (H) 70 - 99 mg/dL    Comment: Glucose reference range applies only to samples taken after fasting for at least 8 hours.   Comment 1 Notify RN    Comment 2 Document in Chart    No results found.  Review of Systems  HENT: Positive for sore throat.   All other systems reviewed and are negative.   Blood pressure (!) 131/60, pulse 69, temperature 97.6 F (36.4 C), temperature source Temporal, resp. rate 18, height _0  (1.549 m), weight 57.8 kg, SpO2 99 %. Physical Exam Constitutional:      Appearance: Normal appearance. She is normal weight.  HENT:     Head: Normocephalic and atraumatic.     Right Ear: External ear normal.     Left Ear: External ear normal.     Nose: Nose normal.     Mouth/Throat:     Mouth: Mucous membranes are moist.     Pharynx: Oropharynx is clear.  Eyes:     Extraocular Movements: Extraocular movements intact.     Conjunctiva/sclera: Conjunctivae normal.     Pupils: Pupils are equal, round, and reactive to light.  Cardiovascular:     Rate and Rhythm: Normal rate.  Pulmonary:     Effort: Pulmonary effort is normal.  Skin:    General: Skin is warm.  Neurological:     General: No focal deficit present.     Mental Status: She is alert and oriented to person, place, and time.  Psychiatric:        Mood and Affect: Mood normal.        Behavior: Behavior normal.        Thought Content: Thought content normal.        Judgment: Judgment normal.      Assessment/Plan Right supraglottic mass  To OR for SMDL with CO2 laser excision and esophagoscopy.  Melida Quitter, MD 10/20/2019, 11:45 AM

## 2019-10-20 NOTE — Anesthesia Procedure Notes (Signed)
Procedure Name: Intubation Date/Time: 10/20/2019 12:09 PM Performed by: Eligha Bridegroom, CRNA Pre-anesthesia Checklist: Patient identified, Emergency Drugs available, Suction available, Patient being monitored and Timeout performed Patient Re-evaluated:Patient Re-evaluated prior to induction Oxygen Delivery Method: Circle system utilized Preoxygenation: Pre-oxygenation with 100% oxygen Induction Type: IV induction Ventilation: Mask ventilation without difficulty and Oral airway inserted - appropriate to patient size Laryngoscope Size: Mac and 4 Grade View: Grade I Laser Tube: Laser Tube and Cuffed inflated with minimal occlusive pressure - saline Tube size: 6.0 mm Number of attempts: 1 Airway Equipment and Method: Patient positioned with wedge pillow and Stylet Placement Confirmation: ETT inserted through vocal cords under direct vision,  positive ETCO2 and breath sounds checked- equal and bilateral Secured at: 20 cm Tube secured with: Tape Dental Injury: Teeth and Oropharynx as per pre-operative assessment

## 2019-10-20 NOTE — Transfer of Care (Signed)
Immediate Anesthesia Transfer of Care Note  Patient: Anita Pollard  Procedure(s) Performed: Suspended Microlaryngoscopy with Biopsy (N/A Mouth) RIGID ESOPHAGOSCOPY (N/A Mouth)  Patient Location: PACU  Anesthesia Type:General  Level of Consciousness: awake, alert  and oriented  Airway & Oxygen Therapy: Patient Spontanous Breathing and Patient connected to nasal cannula oxygen  Post-op Assessment: Report given to RN and Post -op Vital signs reviewed and stable  Post vital signs: Reviewed and stable  Last Vitals:  Vitals Value Taken Time  BP 125/56 10/20/19 1303  Temp    Pulse 89 10/20/19 1305  Resp 21 10/20/19 1305  SpO2 92 % 10/20/19 1305  Vitals shown include unvalidated device data.  Last Pain:  Vitals:   10/20/19 0946  TempSrc:   PainSc: 0-No pain      Patients Stated Pain Goal: 5 (52/48/18 5909)  Complications: No complications documented.

## 2019-10-20 NOTE — Anesthesia Postprocedure Evaluation (Signed)
Anesthesia Post Note  Patient: BEYONCE SAWATZKY  Procedure(s) Performed: Suspended Microlaryngoscopy with Biopsy (N/A Mouth) RIGID ESOPHAGOSCOPY (N/A Mouth)     Patient location during evaluation: PACU Anesthesia Type: General Level of consciousness: awake and alert Pain management: pain level controlled Vital Signs Assessment: post-procedure vital signs reviewed and stable Respiratory status: spontaneous breathing, nonlabored ventilation, respiratory function stable and patient connected to nasal cannula oxygen Cardiovascular status: blood pressure returned to baseline and stable Postop Assessment: no apparent nausea or vomiting Anesthetic complications: no   No complications documented.  Last Vitals:  Vitals:   10/20/19 1305 10/20/19 1320  BP: (!) 125/56 (!) 143/52  Pulse: 85 85  Resp: 18 12  Temp: (!) 36 C (!) 36.2 C  SpO2: 99% 93%    Last Pain:  Vitals:   10/20/19 1320  TempSrc:   PainSc: 0-No pain                 Lewie Deman COKER

## 2019-10-20 NOTE — Addendum Note (Signed)
Addendum  created 10/20/19 1836 by Eligha Bridegroom, CRNA   Intraprocedure Event edited

## 2019-10-20 NOTE — Brief Op Note (Signed)
10/20/2019  12:55 PM  PATIENT:  Anita Pollard  79 y.o. female  PRE-OPERATIVE DIAGNOSIS:  SUPRAGLOTTIC MASS  POST-OPERATIVE DIAGNOSIS:   same  PROCEDURE:  Procedure(s): Suspended Microlaryngoscopy with Biopsy (N/A) RIGID ESOPHAGOSCOPY (N/A)  SURGEON:  Surgeon(s) and Role:    Melida Quitter, MD - Primary  PHYSICIAN ASSISTANT:   ASSISTANTS: none   ANESTHESIA:   general  EBL:  Minimal  BLOOD ADMINISTERED:none  DRAINS: none   LOCAL MEDICATIONS USED:  NONE  SPECIMEN:  Source of Specimen:  Left, right, and anterior epiglottis  DISPOSITION OF SPECIMEN:  PATHOLOGY  COUNTS:  YES  TOURNIQUET:  * No tourniquets in log *  DICTATION: .Note written in EPIC  PLAN OF CARE: Discharge to home after PACU  PATIENT DISPOSITION:  PACU - hemodynamically stable.   Delay start of Pharmacological VTE agent (>24hrs) due to surgical blood loss or risk of bleeding: no

## 2019-10-21 ENCOUNTER — Encounter (HOSPITAL_COMMUNITY): Payer: Self-pay | Admitting: Otolaryngology

## 2019-10-21 LAB — SURGICAL PATHOLOGY

## 2019-12-12 ENCOUNTER — Other Ambulatory Visit: Payer: Self-pay | Admitting: Otolaryngology

## 2020-01-06 NOTE — Progress Notes (Addendum)
Your procedure is scheduled on Friday, January 09, 2020.  Report to [facility] Main Entrance "A" at 5:30 A.M., and check in at the Admitting office.  Call this number if you have problems the morning of surgery:  [phone]  Call [phone] if you have any questions prior to your surgery date Monday-Friday 8am-4pm    Remember:  Do not eat or drink after midnight the night before your surgery     Take these medicines the morning of surgery with A SIP OF WATER:  levothyroxine (SYNTHROID) mycophenolate (MYFORTIC) omeprazole (PRILOSEC)  predniSONE (DELTASONE) rosuvastatin (CRESTOR) tacrolimus (PROGRAF) sulfamethoxazole-trimethoprim (BACTRIM,SEPTRA)  ondansetron (ZOFRAN) - if needed   As of today, STOP taking any Aspirin (unless otherwise instructed by your surgeon) Aleve, Naproxen, Ibuprofen, Motrin, Advil, Goody's, BC's, all herbal medications, fish oil, and all vitamins.    WHAT DO I DO ABOUT MY DIABETES MEDICATION?   Marland Kitchen Do not take your glipiZIDE (GLUCOTROL XL) the night before surgery OR the morning of surgery.   HOW TO MANAGE YOUR DIABETES BEFORE AND AFTER SURGERY  Why is it important to control my blood sugar before and after surgery? . Improving blood sugar levels before and after surgery helps healing and can limit problems. . A way of improving blood sugar control is eating a healthy diet by: o  Eating less sugar and carbohydrates o  Increasing activity/exercise o  Talking with your doctor about reaching your blood sugar goals . High blood sugars (greater than 180 mg/dL) can raise your risk of infections and slow your recovery, so you will need to focus on controlling your diabetes during the weeks before surgery. . Make sure that the doctor who takes care of your diabetes knows about your planned surgery including the date and location.  How do I manage my blood sugar before surgery? . Check your blood sugar at least 4 times a day, starting 2 days before  surgery, to make sure that the level is not too high or low. . Check your blood sugar the morning of your surgery when you wake up and every 2 hours until you get to the Short Stay unit. o If your blood sugar is less than 70 mg/dL, you will need to treat for low blood sugar: - Do not take insulin. - Treat a low blood sugar (less than 70 mg/dL) with  cup of clear juice (cranberry or apple), 4 glucose tablets, OR glucose gel. - Recheck blood sugar in 15 minutes after treatment (to make sure it is greater than 70 mg/dL). If your blood sugar is not greater than 70 mg/dL on recheck, call [phone] for further instructions. . Report your blood sugar to the short stay nurse when you get to Short Stay.  . If you are admitted to the hospital after surgery: o Your blood sugar will be checked by the staff and you will probably be given insulin after surgery (instead of oral diabetes medicines) to make sure you have good blood sugar levels. o The goal for blood sugar control after surgery is 80-180 mg/dL.                      Do not wear jewelry, make up, or nail polish            Do not wear lotions, powders, perfumes, or deodorant.            Do not shave 48 hours prior to surgery.  Do not bring valuables to the hospital.            [facility] is not responsible for any belongings or valuables.  Do NOT Smoke (Tobacco/Vaping) or drink Alcohol 24 hours prior to your procedure If you use a CPAP at night, you may bring all equipment for your overnight stay.   Contacts, glasses, dentures or bridgework may not be worn into surgery.      For patients admitted to the hospital, discharge time will be determined by your treatment team.   Patients discharged the day of surgery will not be allowed to drive home, and someone needs to stay with them for 24 hours.    Special instructions:   [facility]- Preparing For Surgery  Before surgery, you can play an important role. Because skin is not  sterile, your skin needs to be as free of germs as possible. You can reduce the number of germs on your skin by washing with CHG (chlorahexidine gluconate) Soap before surgery.  CHG is an antiseptic cleaner which kills germs and bonds with the skin to continue killing germs even after washing.    Oral Hygiene is also important to reduce your risk of infection.  Remember - BRUSH YOUR TEETH THE MORNING OF SURGERY WITH YOUR REGULAR TOOTHPASTE  Please do not use if you have an allergy to CHG or antibacterial soaps. If your skin becomes reddened/irritated stop using the CHG.  Do not shave (including legs and underarms) for at least 48 hours prior to first CHG shower. It is OK to shave your face.  Please follow these instructions carefully.   1. Shower the NIGHT BEFORE SURGERY and the MORNING OF SURGERY with CHG Soap.   2. If you chose to wash your hair, wash your hair first as usual with your normal shampoo.  3. After you shampoo, rinse your hair and body thoroughly to remove the shampoo.  4. Use CHG as you would any other liquid soap. You can apply CHG directly to the skin and wash gently with a scrungie or a clean washcloth.   5. Apply the CHG Soap to your body ONLY FROM THE NECK DOWN.  Do not use on open wounds or open sores. Avoid contact with your eyes, ears, mouth and genitals (private parts). Wash Face and genitals (private parts)  with your normal soap.   6. Wash thoroughly, paying special attention to the area where your surgery will be performed.  7. Thoroughly rinse your body with warm water from the neck down.  8. DO NOT shower/wash with your normal soap after using and rinsing off the CHG Soap.  9. Pat yourself dry with a CLEAN TOWEL.  10. Wear CLEAN PAJAMAS to bed the night before surgery  11. Place CLEAN SHEETS on your bed the night of your first shower and DO NOT SLEEP WITH PETS.   Day of Surgery: Wear Clean/Comfortable clothing the morning of surgery Do not apply any  deodorants/lotions.   Remember to brush your teeth WITH YOUR REGULAR TOOTHPASTE.   Please read over the following fact sheets that you were given.

## 2020-01-07 ENCOUNTER — Other Ambulatory Visit (HOSPITAL_COMMUNITY)
Admission: RE | Admit: 2020-01-07 | Discharge: 2020-01-07 | Disposition: A | Discharge status: Home / Self Care | Payer: Medicare Other | Source: Ambulatory Visit | Attending: Otolaryngology | Admitting: Otolaryngology

## 2020-01-07 ENCOUNTER — Encounter (HOSPITAL_COMMUNITY)
Admission: RE | Admit: 2020-01-07 | Discharge: 2020-01-07 | Disposition: A | Discharge status: Home / Self Care | Payer: Medicare Other | Source: Ambulatory Visit | Attending: Otolaryngology | Admitting: Otolaryngology

## 2020-01-07 ENCOUNTER — Other Ambulatory Visit: Payer: Self-pay

## 2020-01-07 ENCOUNTER — Encounter (HOSPITAL_COMMUNITY): Payer: Self-pay

## 2020-01-07 DIAGNOSIS — Z20822 Contact with and (suspected) exposure to covid-19: Secondary | ICD-10-CM | POA: Insufficient documentation

## 2020-01-07 DIAGNOSIS — Z01812 Encounter for preprocedural laboratory examination: Secondary | ICD-10-CM | POA: Insufficient documentation

## 2020-01-07 DIAGNOSIS — E119 Type 2 diabetes mellitus without complications: Secondary | ICD-10-CM | POA: Insufficient documentation

## 2020-01-07 HISTORY — DX: Chronic kidney disease, unspecified: N18.9

## 2020-01-07 HISTORY — DX: Cardiac murmur, unspecified: R01.1

## 2020-01-07 LAB — BASIC METABOLIC PANEL
Anion gap: 11 (ref 5–15)
BUN: 28 mg/dL — ABNORMAL HIGH (ref 8–23)
CO2: 25 mmol/L (ref 22–32)
Calcium: 9.6 mg/dL (ref 8.9–10.3)
Chloride: 101 mmol/L (ref 98–111)
Creatinine, Ser: 1.1 mg/dL — ABNORMAL HIGH (ref 0.44–1.00)
GFR, Estimated: 47 mL/min — ABNORMAL LOW (ref >60)
Glucose, Bld: 258 mg/dL — ABNORMAL HIGH (ref 70–99)
Potassium: 4.7 mmol/L (ref 3.5–5.1)
Sodium: 137 mmol/L (ref 135–145)

## 2020-01-07 LAB — CBC
HCT: 37.7 % (ref 36.0–46.0)
Hemoglobin: 11.8 g/dL — ABNORMAL LOW (ref 12.0–15.0)
MCH: 30.7 pg (ref 26.0–34.0)
MCHC: 31.3 g/dL (ref 30.0–36.0)
MCV: 98.2 fL (ref 80.0–100.0)
Platelets: 183 10*3/uL (ref 150–400)
RBC: 3.84 MIL/uL — ABNORMAL LOW (ref 3.87–5.11)
RDW: 13.2 % (ref 11.5–15.5)
WBC: 7.6 10*3/uL (ref 4.0–10.5)
nRBC: 0 % (ref 0.0–0.2)

## 2020-01-07 LAB — GLUCOSE, CAPILLARY: Glucose-Capillary: 233 mg/dL — ABNORMAL HIGH (ref 70–99)

## 2020-01-07 LAB — SARS CORONAVIRUS 2 (TAT 6-24 HRS): SARS Coronavirus 2: NEGATIVE

## 2020-01-07 NOTE — Progress Notes (Signed)
PCP - Dr. Lona Kettle Cardiologist - Denies  PPM/ICD - Denies  Chest x-ray - N/A EKG - 10/16/19 Stress Test - Denies ECHO - 05/08/18 Cardiac Cath - Denies  Sleep Study - Denies  Fasting Blood Sugar - 130-160 Checks Blood Sugar __1___ time a day  Blood Thinner Instructions: N/A Aspirin Instructions: N/A  ERAS Protcol - No  COVID TEST- 01/07/20   Anesthesia review: No  Patient denies shortness of breath, fever, cough and chest pain at PAT appointment   All instructions explained to the patient, with a verbal understanding of the material. Patient agrees to go over the instructions while at home for a better understanding. Patient also instructed to self quarantine after being tested for COVID-19. The opportunity to ask questions was provided.

## 2020-01-08 NOTE — Anesthesia Preprocedure Evaluation (Addendum)
Anesthesia Evaluation  Patient identified by MRN, date of birth, ID band Patient awake    Reviewed: Allergy & Precautions, NPO status , Patient's Chart, lab work & pertinent test results  Airway Mallampati: II  TM Distance: >3 FB Neck ROM: Full    Dental  (+) Dental Advisory Given   Pulmonary former smoker,  Supraglottic mass    breath sounds clear to auscultation       Cardiovascular hypertension, Pt. on medications  Rhythm:Regular Rate:Normal     Neuro/Psych negative neurological ROS     GI/Hepatic Neg liver ROS, PUD, GERD  ,  Endo/Other  diabetes, Type 2Hypothyroidism   Renal/GU Renal InsufficiencyRenal disease     Musculoskeletal  (+) Arthritis ,   Abdominal   Peds  Hematology  (+) anemia ,   Anesthesia Other Findings   Reproductive/Obstetrics                           Lab Results  Component Value Date   WBC 7.6 01/07/2020   HGB 11.8 (L) 01/07/2020   HCT 37.7 01/07/2020   MCV 98.2 01/07/2020   PLT 183 01/07/2020   Lab Results  Component Value Date   CREATININE 1.10 (H) 01/07/2020   BUN 28 (H) 01/07/2020   NA 137 01/07/2020   K 4.7 01/07/2020   CL 101 01/07/2020   CO2 25 01/07/2020   IMAGES: CT soft tissue neck 10/08/19 (ordered by Melida Quitter, MD): IMPRESSION: 1. Nodular thickening at the tip of the epiglottis. 2. 7 mm ground-glass nodule in the right upper lobe. Initial follow-up with CT at 6-12 months is recommended to confirm persistence. If persistent, repeat CT is recommended every 2 years until 5 years of stability has been established. This recommendation follows the consensus statement: Guidelines for Management of Incidental Pulmonary Nodules Detected on CT Images: From the Fleischner Society 2017; Radiology 2017; 284:228-243.   EKG:10/16/19: NSR   CV: Carotid US 05/16/18: Summary: -Right Carotid: Velocities in the right ICA are consistent with a  1-39% stenosis. The RICA velocities remain within normal range and stable when compared to the prior exam. -Left Carotid: Velocities in the left ICA are consistent with a 1-39% stenosis. Non-hemodynamically significant plaque noted in the CCA. The LICA velocities remain within normal range and have increased when compared to the prior exam. -Vertebrals: Bilateral vertebral arteries demonstrate antegrade flow. -Subclavians: Normal flow hemodynamics were seen in bilateral subclavian arteries.   Echo 05/08/18: IMPRESSIONS  1. The left ventricle has hyperdynamic systolic function of >23%. The  cavity size was normal. Left ventricular diastolic Doppler parameters are  consistent with impaired relaxation.  2. The right ventricle has normal systolic function. The cavity was  normal. There is no increase in right ventricular wall thickness.  3. The mitral valve is normal in structure.  4. The tricuspid valve is normal in structure.  5. The aortic valve is normal in structure.  6. The pulmonic valve was normal in structure.  7. Right atrial pressure is estimated at 3 mmHg.  Anesthesia Physical Anesthesia Plan  ASA: III  Anesthesia Plan: General   Post-op Pain Management:    Induction: Intravenous  PONV Risk Score and Plan: 3 and Dexamethasone, Ondansetron and Treatment may vary due to age or medical condition  Airway Management Planned: Oral ETT  Additional Equipment:   Intra-op Plan:   Post-operative Plan: Extubation in OR  Informed Consent: I have reviewed the patients History and Physical, chart, labs and  discussed the procedure including the risks, benefits and alternatives for the proposed anesthesia with the patient or authorized representative who has indicated his/her understanding and acceptance.     Dental advisory given  Plan Discussed with: CRNA  Anesthesia Plan Comments: (PAT note written 01/08/2020 by Myra Gianotti, PA-C. )      Anesthesia  Quick Evaluation

## 2020-01-08 NOTE — Progress Notes (Signed)
Anesthesia Chart Review:  Case: [id number] Date/Time: 01/09/20 0715   Procedure: MICROLARYNGOSCOPY WITH BIOPSY (N/A )   Anesthesia type: General   Pre-op diagnosis: Supraglottic Mass   Location: [facility] OR ROOM 09 / [place] OR   Surgeons: Melida Quitter, MD      DISCUSSION: Patient is an 79 year old female scheduled for the above procedure. She is s/p suspended microdirect laryngoscopy with biopsy, rigid esophagoscopy on 10/20/19. 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 At 12/04/19 follow-up she reported throat pain. Office transnasal fiberoptic laryngoscopy demonstrated an ulcerative mass to the left side of the epiglottis, so repeat laryngoscopy with biopsy recommended.    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  Presurgical COVID-19 test negative on 01/07/20.  Anesthesia team to evaluate on the day of surgery.   VS: BP (!) 145/60   Pulse 67   Temp 36.5 C   Resp 18   Ht 5\' 1"  (1.549 m)   Wt 58.2 kg   SpO2 98%   BMI 24.22 kg/m    PROVIDERS: Lawerance Cruel, MD is PCP - Shea Stakes, MD is nephrologist ([place]). She had routine transplant follow-up with Algernon Huxley, PA-C on 11/26/19. She is on long term Bactrim for infectious prophylaxis.  Ena Dawley, MD is cardiologist. Last evaluation 05/14/19 for murmur. Echo in 2020 did not show any significant valvular abnormalities.    LABS: Preoperative labs reviewed. Non-fasting glucose 258. A1c 7.6% on 11/26/19 ([place]). She reports fasting CBGs ~ 130-160. (all labs ordered are listed, but only  abnormal results are displayed)  Labs Reviewed  BASIC METABOLIC PANEL - Abnormal; Notable for the following components:      Result Value   Glucose, Bld 258 (*)    BUN 28 (*)    Creatinine, Ser 1.10 (*)    GFR, Estimated 47 (*)    All other components within normal limits  CBC - Abnormal; Notable for the following components:   RBC 3.84 (*)    Hemoglobin 11.8 (*)    All other components within normal limits  GLUCOSE, CAPILLARY - Abnormal; Notable for the following components:   Glucose-Capillary 233 (*)    All other components within normal limits     IMAGES: CT soft tissue neck 10/08/19 (ordered by Melida Quitter, MD): IMPRESSION: 1. Nodular thickening at the tip of the epiglottis. 2. 7 mm ground-glass nodule in the right upper lobe. Initial follow-up with CT at 6-12 months is recommended to confirm persistence. If persistent, repeat CT is recommended every 2 years until 5 years of stability has been established. This recommendation follows the consensus statement: Guidelines for Management of Incidental Pulmonary Nodules Detected on CT Images: From the Fleischner Society 2017; Radiology 2017; 284:228-243.   EKG: 10/16/19: NSR   CV: Carotid US 05/16/18: Summary:  - Right Carotid: Velocities in the right ICA are consistent with a 1-39% stenosis. The RICA velocities remain within normal range and stable when compared to the prior exam.  - Left Carotid: Velocities in the left ICA are consistent with a 1-39% stenosis. Non-hemodynamically significant plaque noted in the CCA. The LICA velocities remain within normal range and have increased when compared  to the prior exam.  - Vertebrals: Bilateral vertebral arteries demonstrate antegrade flow.  - Subclavians: Normal flow hemodynamics were seen in bilateral subclavian arteries.    Echo 05/08/18: IMPRESSIONS  1. The left ventricle has hyperdynamic systolic function of >84%. The  cavity size was normal. Left ventricular  diastolic Doppler parameters are  consistent with impaired relaxation.  2. The right ventricle has normal systolic function. The cavity was  normal. There is no increase in right ventricular wall thickness.  3. The mitral valve is normal in structure.  4. The tricuspid valve is normal in structure.  5. The aortic valve is normal in structure.  6. The pulmonic valve was normal in structure.  7. Right atrial pressure is estimated at 3 mmHg.   Stress Echo 07/11/13 An[facility] CE): STRESS ECHO  Normal left ventricular function and global wall motion with stress. There  were no segmental wall motion abnormalities post exercise. There was normal  increase in global LV function post exercise. Global LV function is preserved  with stress. The estimated LV ejection fraction is 65-70% with stress.  Negative exercise echocardiography for inducible ischemia at target heart  rate.    Past Medical History:  Diagnosis Date  . Alcohol abuse   . Anemia   . Arthritis   . Breast cancer ([place]) 2010   Right  . Chronic kidney disease   . Colon polyp   . Depression   . Diabetes mellitus   . GERD (gastroesophageal reflux disease)   . Heart murmur   . History of kidney stones 1976  . HTN (hypertension)   . Hypothyroidism   . Osteopenia   . Pancreatitis   . Ulcerative esophagitis     Past Surgical History:  Procedure Laterality Date  . ABDOMINAL HYSTERECTOMY    . ABDOMINOPLASTY    . APPENDECTOMY    . BREAST BIOPSY Right 08/03/2008   Stereo Bx, Malignant  . BREAST LUMPECTOMY Right 08/26/2008  . CATARACT EXTRACTION W/ INTRAOCULAR LENS  IMPLANT, BILATERAL    . COSMETIC SURGERY    . ductal carcinoma     right breast   . EYE SURGERY    . HIP ARTHROPLASTY Right   . INSERTION OF DIALYSIS CATHETER Right 09/25/2012   Procedure: INSERTION OF DIALYSIS CATHETER;  Surgeon: Mal Misty, MD;  Location: [facility];  Service: Vascular;  Laterality: Right;  Righ Internal Jugular Placement  . JOINT  REPLACEMENT    . 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 MICROLARYNGOSCOPY N/A 10/20/2019   Procedure: Suspended Microlaryngoscopy with Biopsy;  Surgeon: Melida Quitter, MD;  Location: [facility];  Service: ENT;  Laterality: N/A;  . RIGID ESOPHAGOSCOPY N/A 10/20/2019   Procedure: RIGID ESOPHAGOSCOPY;  Surgeon: Melida Quitter, MD;  Location: [facility];  Service: ENT;  Laterality: N/A;  . THYROIDECTOMY    . TUMMY TUCK      MEDICATIONS: . amLODipine (NORVASC) 10 MG tablet  . Cholecalciferol (VITAMIN D3) 5000 UNITS TABS  . collagenase (SANTYL) ointment  . glipiZIDE (GLUCOTROL XL) 2.5 MG 24 hr tablet  . Lancets (FREESTYLE) lancets  . levothyroxine (SYNTHROID) 200 MCG tablet  . losartan (COZAAR) 25 MG tablet  . magnesium oxide (MAGOX 400) 400 (241.3 MG) MG tablet  . Melatonin 5 MG CAPS  . Misc Natural Products (GLUCOS-CHONDROIT-MSM COMPLEX PO)  . Multiple Vitamin (MULTIVITAMIN) tablet  . mycophenolate (MYFORTIC) 180 MG EC tablet  . omeprazole (PRILOSEC) 20 MG capsule  . ondansetron (ZOFRAN) 4 MG tablet  . Pancrelipase, Lip-Prot-Amyl, (CREON) 3000-9500 units  CPEP  . Polyethyl Glycol-Propyl Glycol (SYSTANE OP)  . potassium chloride (MICRO-K) 10 MEQ CR capsule  . pramipexole (MIRAPEX) 0.25 MG tablet  . predniSONE (DELTASONE) 10 MG tablet  . Probiotic Product (CULTURELLE PROBIOTICS PO)  . raloxifene (EVISTA) 60 MG tablet  . rosuvastatin (CRESTOR) 5 MG tablet  . sulfamethoxazole-trimethoprim (BACTRIM,SEPTRA) 400-80 MG tablet  . tacrolimus (PROGRAF) 0.5 MG capsule  . tacrolimus (PROGRAF) 1 MG capsule  . temazepam (RESTORIL) 15 MG capsule  . Turmeric (QC TUMERIC COMPLEX PO)  . vitamin B-12 (CYANOCOBALAMIN) 500 MCG tablet   No current facility-administered medications for this encounter.    Myra Gianotti, PA-C Surgical Short Stay/Anesthesiology [facility] Phone [phone] [facility] Phone [phone] 01/08/2020 10:00 AM

## 2020-01-09 ENCOUNTER — Other Ambulatory Visit: Payer: Self-pay

## 2020-01-09 ENCOUNTER — Ambulatory Visit (HOSPITAL_COMMUNITY): Payer: Medicare Other | Admitting: Certified Registered Nurse Anesthetist

## 2020-01-09 ENCOUNTER — Ambulatory Visit (HOSPITAL_COMMUNITY): Payer: Medicare Other | Admitting: Vascular Surgery

## 2020-01-09 ENCOUNTER — Encounter (HOSPITAL_COMMUNITY)
Admission: RE | Disposition: A | Discharge status: Home / Self Care | Payer: Self-pay | Source: Home / Self Care | Attending: Otolaryngology

## 2020-01-09 ENCOUNTER — Ambulatory Visit (HOSPITAL_COMMUNITY)
Admission: RE | Admit: 2020-01-09 | Discharge: 2020-01-09 | Disposition: A | Discharge status: Home / Self Care | Payer: Medicare Other | Attending: Otolaryngology | Admitting: Otolaryngology

## 2020-01-09 ENCOUNTER — Encounter (HOSPITAL_COMMUNITY): Payer: Self-pay | Admitting: Otolaryngology

## 2020-01-09 DIAGNOSIS — Z79890 Hormone replacement therapy (postmenopausal): Secondary | ICD-10-CM | POA: Diagnosis not present

## 2020-01-09 DIAGNOSIS — M8580 Other specified disorders of bone density and structure, unspecified site: Secondary | ICD-10-CM | POA: Insufficient documentation

## 2020-01-09 DIAGNOSIS — E890 Postprocedural hypothyroidism: Secondary | ICD-10-CM | POA: Diagnosis not present

## 2020-01-09 DIAGNOSIS — K219 Gastro-esophageal reflux disease without esophagitis: Secondary | ICD-10-CM | POA: Diagnosis not present

## 2020-01-09 DIAGNOSIS — Z87891 Personal history of nicotine dependence: Secondary | ICD-10-CM | POA: Diagnosis not present

## 2020-01-09 DIAGNOSIS — Z940 Kidney transplant status: Secondary | ICD-10-CM | POA: Diagnosis not present

## 2020-01-09 DIAGNOSIS — Z7952 Long term (current) use of systemic steroids: Secondary | ICD-10-CM | POA: Diagnosis not present

## 2020-01-09 DIAGNOSIS — Z79810 Long term (current) use of selective estrogen receptor modulators (SERMs): Secondary | ICD-10-CM | POA: Insufficient documentation

## 2020-01-09 DIAGNOSIS — Z792 Long term (current) use of antibiotics: Secondary | ICD-10-CM | POA: Insufficient documentation

## 2020-01-09 DIAGNOSIS — Z79899 Other long term (current) drug therapy: Secondary | ICD-10-CM | POA: Insufficient documentation

## 2020-01-09 DIAGNOSIS — Z96641 Presence of right artificial hip joint: Secondary | ICD-10-CM | POA: Insufficient documentation

## 2020-01-09 DIAGNOSIS — N189 Chronic kidney disease, unspecified: Secondary | ICD-10-CM | POA: Diagnosis not present

## 2020-01-09 DIAGNOSIS — I129 Hypertensive chronic kidney disease with stage 1 through stage 4 chronic kidney disease, or unspecified chronic kidney disease: Secondary | ICD-10-CM | POA: Diagnosis not present

## 2020-01-09 DIAGNOSIS — Z880 Allergy status to penicillin: Secondary | ICD-10-CM | POA: Insufficient documentation

## 2020-01-09 DIAGNOSIS — E1122 Type 2 diabetes mellitus with diabetic chronic kidney disease: Secondary | ICD-10-CM | POA: Insufficient documentation

## 2020-01-09 DIAGNOSIS — Z7984 Long term (current) use of oral hypoglycemic drugs: Secondary | ICD-10-CM | POA: Insufficient documentation

## 2020-01-09 DIAGNOSIS — C131 Malignant neoplasm of aryepiglottic fold, hypopharyngeal aspect: Secondary | ICD-10-CM | POA: Insufficient documentation

## 2020-01-09 DIAGNOSIS — J387 Other diseases of larynx: Secondary | ICD-10-CM | POA: Diagnosis present

## 2020-01-09 DIAGNOSIS — Z888 Allergy status to other drugs, medicaments and biological substances status: Secondary | ICD-10-CM | POA: Diagnosis not present

## 2020-01-09 DIAGNOSIS — Z853 Personal history of malignant neoplasm of breast: Secondary | ICD-10-CM | POA: Insufficient documentation

## 2020-01-09 HISTORY — PX: CO2 LASER APPLICATION: SHX5778

## 2020-01-09 HISTORY — PX: MICROLARYNGOSCOPY: SHX5208

## 2020-01-09 LAB — GLUCOSE, CAPILLARY
Glucose-Capillary: 156 mg/dL — ABNORMAL HIGH (ref 70–99)
Glucose-Capillary: 149 mg/dL — ABNORMAL HIGH (ref 70–99)

## 2020-01-09 SURGERY — MICROLARYNGOSCOPY
Anesthesia: General | Site: Throat

## 2020-01-09 MED ORDER — SUGAMMADEX SODIUM 200 MG/2ML IV SOLN
INTRAVENOUS | Status: DC | PRN
  Administered 2020-01-09: 200 mg via INTRAVENOUS

## 2020-01-09 MED ORDER — PHENYLEPHRINE 40 MCG/ML (10ML) SYRINGE FOR IV PUSH (FOR BLOOD PRESSURE SUPPORT)
PREFILLED_SYRINGE | INTRAVENOUS | Status: DC | PRN
  Administered 2020-01-09 (×2): 80 ug via INTRAVENOUS

## 2020-01-09 MED ORDER — LIDOCAINE 2% (20 MG/ML) 5 ML SYRINGE
INTRAMUSCULAR | Status: DC | PRN
  Administered 2020-01-09: 60 mg via INTRAVENOUS

## 2020-01-09 MED ORDER — DEXAMETHASONE SODIUM PHOSPHATE 10 MG/ML IJ SOLN
INTRAMUSCULAR | Status: DC | PRN
  Administered 2020-01-09: 5 mg via INTRAVENOUS

## 2020-01-09 MED ORDER — FENTANYL CITRATE (PF) 100 MCG/2ML IJ SOLN: 25.0000 ug | INTRAMUSCULAR | Status: DC | PRN

## 2020-01-09 MED ORDER — AMISULPRIDE (ANTIEMETIC) 5 MG/2ML IV SOLN: 10.0000 mg | Freq: Once | INTRAVENOUS | Status: DC | PRN

## 2020-01-09 MED ORDER — PROPOFOL 10 MG/ML IV BOLUS
INTRAVENOUS | Status: DC | PRN
  Administered 2020-01-09: 30 mg via INTRAVENOUS
  Administered 2020-01-09: 70 mg via INTRAVENOUS

## 2020-01-09 MED ORDER — DEXAMETHASONE SODIUM PHOSPHATE 10 MG/ML IJ SOLN
INTRAMUSCULAR | Status: AC
  Filled 2020-01-09: qty 1

## 2020-01-09 MED ORDER — PHENYLEPHRINE 40 MCG/ML (10ML) SYRINGE FOR IV PUSH (FOR BLOOD PRESSURE SUPPORT)
PREFILLED_SYRINGE | INTRAVENOUS | Status: AC
  Filled 2020-01-09: qty 10

## 2020-01-09 MED ORDER — ACETAMINOPHEN 500 MG PO TABS
1000.0000 mg | ORAL_TABLET | Freq: Once | ORAL | Status: AC
  Filled 2020-01-09: qty 2

## 2020-01-09 MED ORDER — EPINEPHRINE HCL (NASAL) 0.1 % NA SOLN
NASAL | Status: AC
  Filled 2020-01-09: qty 30

## 2020-01-09 MED ORDER — ROCURONIUM BROMIDE 10 MG/ML (PF) SYRINGE
PREFILLED_SYRINGE | INTRAVENOUS | Status: DC | PRN
  Administered 2020-01-09: 30 mg via INTRAVENOUS
  Administered 2020-01-09 (×2): 10 mg via INTRAVENOUS

## 2020-01-09 MED ORDER — ONDANSETRON HCL 4 MG/2ML IJ SOLN
INTRAMUSCULAR | Status: AC
  Filled 2020-01-09: qty 2

## 2020-01-09 MED ORDER — EPINEPHRINE PF 1 MG/ML IJ SOLN
INTRAMUSCULAR | Status: DC | PRN
  Administered 2020-01-09: 1 mg

## 2020-01-09 MED ORDER — ROCURONIUM BROMIDE 10 MG/ML (PF) SYRINGE
PREFILLED_SYRINGE | INTRAVENOUS | Status: AC
  Filled 2020-01-09: qty 10

## 2020-01-09 MED ORDER — ONDANSETRON HCL 4 MG/2ML IJ SOLN
INTRAMUSCULAR | Status: DC | PRN
  Administered 2020-01-09: 4 mg via INTRAVENOUS

## 2020-01-09 MED ORDER — LIDOCAINE 2% (20 MG/ML) 5 ML SYRINGE
INTRAMUSCULAR | Status: AC
  Filled 2020-01-09: qty 5

## 2020-01-09 MED ORDER — FENTANYL CITRATE (PF) 250 MCG/5ML IJ SOLN
INTRAMUSCULAR | Status: AC
  Filled 2020-01-09: qty 5

## 2020-01-09 MED ORDER — CHLORHEXIDINE GLUCONATE 0.12 % MT SOLN
15.0000 mL | Freq: Once | OROMUCOSAL | Status: AC
  Administered 2020-01-09: 15 mL via OROMUCOSAL
  Filled 2020-01-09: qty 15

## 2020-01-09 MED ORDER — LACTATED RINGERS IV SOLN: INTRAVENOUS | Status: DC

## 2020-01-09 MED ORDER — ORAL CARE MOUTH RINSE: 15.0000 mL | Freq: Once | OROMUCOSAL | Status: AC

## 2020-01-09 MED ORDER — FENTANYL CITRATE (PF) 250 MCG/5ML IJ SOLN
INTRAMUSCULAR | Status: DC | PRN
Start: 2020-01-09 — End: 2020-01-09
  Administered 2020-01-09 (×2): 50 ug via INTRAVENOUS

## 2020-01-09 MED ORDER — PROPOFOL 10 MG/ML IV BOLUS
INTRAVENOUS | Status: AC
  Filled 2020-01-09: qty 20

## 2020-01-09 MED ORDER — 0.9 % SODIUM CHLORIDE (POUR BTL) OPTIME
TOPICAL | Status: DC | PRN
  Administered 2020-01-09: 1000 mL

## 2020-01-09 SURGICAL SUPPLY — 30 items
CANISTER SUCT 3000ML PPV (MISCELLANEOUS) ×4 IMPLANT
CNTNR URN SCR LID CUP LEK RST (MISCELLANEOUS) IMPLANT
CONT SPEC 4OZ STRL OR WHT (MISCELLANEOUS)
COVER BACK TABLE 60X90IN (DRAPES) ×4 IMPLANT
COVER MAYO STAND STRL (DRAPES) ×4 IMPLANT
COVER WAND RF STERILE (DRAPES) ×4 IMPLANT
DRAPE HALF SHEET 40X57 (DRAPES) ×4 IMPLANT
GAUZE SPONGE 4X4 12PLY STRL (GAUZE/BANDAGES/DRESSINGS) ×4 IMPLANT
GLOVE BIO SURGEON STRL SZ7.5 (GLOVE) ×4 IMPLANT
GOWN STRL REUS W/ TWL LRG LVL3 (GOWN DISPOSABLE) IMPLANT
GOWN STRL REUS W/TWL LRG LVL3 (GOWN DISPOSABLE)
GUARD TEETH (MISCELLANEOUS) IMPLANT
KIT BASIN OR (CUSTOM PROCEDURE TRAY) ×4 IMPLANT
KIT TURNOVER KIT B (KITS) ×4 IMPLANT
NDL HYPO 25GX1X1/2 BEV (NEEDLE) IMPLANT
NDL TRANS ORAL INJECTION (NEEDLE) IMPLANT
NEEDLE HYPO 25GX1X1/2 BEV (NEEDLE) IMPLANT
NEEDLE TRANS ORAL INJECTION (NEEDLE) IMPLANT
NS IRRIG 1000ML POUR BTL (IV SOLUTION) ×4 IMPLANT
PAD ARMBOARD 7.5X6 YLW CONV (MISCELLANEOUS) ×8 IMPLANT
PATTIES SURGICAL .5 X1 (DISPOSABLE) IMPLANT
PATTIES SURGICAL .5 X3 (DISPOSABLE) IMPLANT
POSITIONER HEAD DONUT 9IN (MISCELLANEOUS) IMPLANT
SOL ANTI FOG 6CC (MISCELLANEOUS) ×2 IMPLANT
SOLUTION ANTI FOG 6CC (MISCELLANEOUS) ×2
SURGILUBE 2OZ TUBE FLIPTOP (MISCELLANEOUS) IMPLANT
TOWEL GREEN STERILE FF (TOWEL DISPOSABLE) ×8 IMPLANT
TUBE CONNECTING 12'X1/4 (SUCTIONS) ×1
TUBE CONNECTING 12X1/4 (SUCTIONS) ×3 IMPLANT
WATER STERILE IRR 1000ML POUR (IV SOLUTION) IMPLANT

## 2020-01-09 NOTE — Anesthesia Procedure Notes (Signed)
Procedure Name: Intubation Date/Time: 01/09/2020 7:54 AM Performed by: Dorthea Cove, CRNA Pre-anesthesia Checklist: Patient identified, Emergency Drugs available, Suction available and Patient being monitored Patient Re-evaluated:Patient Re-evaluated prior to induction Oxygen Delivery Method: Circle system utilized Preoxygenation: Pre-oxygenation with 100% oxygen Induction Type: IV induction Ventilation: Mask ventilation without difficulty Laryngoscope Size: Mac and 3 Grade View: Grade I Tube type: Oral Tube size: 6.0 mm Number of attempts: 1 Airway Equipment and Method: Stylet and Oral airway Placement Confirmation: ETT inserted through vocal cords under direct vision,  positive ETCO2 and breath sounds checked- equal and bilateral Secured at: 19 cm Tube secured with: Tape Dental Injury: Teeth and Oropharynx as per pre-operative assessment

## 2020-01-09 NOTE — Op Note (Signed)
Preop diagnosis: Supraglottic mass Postop diagnosis: same Procedure: Suspended microdirect laryngoscopy with CO2 laser and biopsy Surgeon: Redmond Baseman Anesth: General endotracheal anesthesia Compl: None Indications: The patient is an 79 year old female with a history of throat pain since February.  She was found to have an epiglottic mass and underwent biopsy in July that was inconclusive.  She presents for surgical evaluation and repeat biopsy. Findings: Mucosal irregularity of tip of epiglottis extending to either side somewhat with some bulk in the lingual side of the epiglottis more to the right.  Granular mass also of the left aryepiglottic fold.  Biopsies taken from right ligual epiglottis and left aryepiglottic fold mass. Description:  After discussing risks, benefits, and alternatives, the patient was brought to the operating room and placed on the operative table in the supine position.  Anesthesia was induced and the patient was intubated by the anesthesia team without difficulty.  The eyes were taped closed and the bed was turned 90 degrees from anesthesia.  Intravenous steroids were given.  Damp eye pads were taped in place and a tooth guard was placed over the upper teeth.  A Dedo laryngoscope was inserted and used to view the various areas of the pharynx and larynx including the endolarynx, post-cricoid area, pyriform sinuses, and vallecula.  Findings are noted above.  The laryngoscope was placed in position to view the epiglottis and placed in suspension on the Mayo stand using a suspension arm.  The rigid telescope was used to evaluate the epiglottis and a photo was made.  An epinephrine-soaked pledget was held against the lingual surface of the epiglottis.  The CO2 laser was then used on a setting of 8 watts to make an incision on the superior lingual surface.  Cup forceps were used to take multiple biopsies down to cartilage.  These were sent for frozen section.  While waiting, the laryngoscope  was taken out of suspension and used to evaluate the larynx further.  The left aryepiglottic fold mass was then seen and the laryngoscope re-suspended.  Biopsies were taken from the left aryepiglottic fold mass and sent for permanent section.  Epinephrine-soaked pledgets were held against the site for a couple of minutes.  After completion, the laryngoscope was removed as the pharynx was suctioned.  The patient was then turned back to anesthesia for wake up and was extubated and moved to the recovery room in stable condition.

## 2020-01-09 NOTE — Anesthesia Postprocedure Evaluation (Signed)
Anesthesia Post Note  Patient: Anita [facility]  Procedure(s) Performed: MICROLARYNGOSCOPY WITH BIOPSY (N/A ) CO2 LASER APPLICATION (N/A Throat)     Patient location during evaluation: PACU Anesthesia Type: General Level of consciousness: awake and alert Pain management: pain level controlled Vital Signs Assessment: post-procedure vital signs reviewed and stable Respiratory status: spontaneous breathing, nonlabored ventilation, respiratory function stable and patient connected to nasal cannula oxygen Cardiovascular status: blood pressure returned to baseline and stable Postop Assessment: no apparent nausea or vomiting Anesthetic complications: no   No complications documented.  Last Vitals:  Vitals:   01/09/20 0855 01/09/20 0925  BP: 126/61 (!) 152/58  Pulse: 80 74  Resp: 15 13  Temp: (!) 36.2 C 36.5 C  SpO2: 98% 98%    Last Pain:  Vitals:   01/09/20 0925  TempSrc:   PainSc: 0-No pain                 Tiajuana Amass

## 2020-01-09 NOTE — Brief Op Note (Signed)
01/09/2020  8:43 AM  PATIENT:  Anita [facility]  79 y.o. female  PRE-OPERATIVE DIAGNOSIS:  Supraglottic Mass  POST-OPERATIVE DIAGNOSIS:  Same  PROCEDURE:  Procedure(s): MICROLARYNGOSCOPY WITH BIOPSY (N/A) CO2 LASER APPLICATION (N/A)  SURGEON:  Surgeon(s) and Role:    Melida Quitter, MD - Primary  PHYSICIAN ASSISTANT:   ASSISTANTS: none   ANESTHESIA:   general  EBL:  2 mL   BLOOD ADMINISTERED:none  DRAINS: none   LOCAL MEDICATIONS USED:  NONE  SPECIMEN:  Source of Specimen:  Epiglottic mass, left aryepiglottic fold mass  DISPOSITION OF SPECIMEN:  PATHOLOGY  COUNTS:  YES  TOURNIQUET:  * No tourniquets in log *  DICTATION: .Note written in EPIC  PLAN OF CARE: Discharge to home after PACU  PATIENT DISPOSITION:  PACU - hemodynamically stable.   Delay start of Pharmacological VTE agent (>24hrs) due to surgical blood loss or risk of bleeding: no

## 2020-01-09 NOTE — Transfer of Care (Signed)
Immediate Anesthesia Transfer of Care Note  Patient: Anita Pollard  Procedure(s) Performed: MICROLARYNGOSCOPY WITH BIOPSY (N/A ) CO2 LASER APPLICATION (N/A Throat)  Patient Location: PACU  Anesthesia Type:General  Level of Consciousness: awake, alert  and oriented  Airway & Oxygen Therapy: Patient Spontanous Breathing  Post-op Assessment: Report given to RN and Post -op Vital signs reviewed and stable  Post vital signs: Reviewed and stable  Last Vitals:  Vitals Value Taken Time  BP 126/61 01/09/20 0853  Temp    Pulse 77 01/09/20 0857  Resp 13 01/09/20 0857  SpO2 100 % 01/09/20 0857  Vitals shown include unvalidated device data.  Last Pain:  Vitals:   01/09/20 0721  TempSrc:   PainSc: 3       Patients Stated Pain Goal: 2 (42/10/31 2811)  Complications: No complications documented.

## 2020-01-09 NOTE — H&P (Signed)
Anita Pollard is an 79 y.o. female.   Chief Complaint: Epiglottis mass HPI: 79 year old female with throat pain and found to have a mass in the epiglottis.  Previous biopsy was unrevealing but the mass and symptoms persist.  She presents for repeat biopsy.  Past Medical History:  Diagnosis Date  . Alcohol abuse   . Anemia   . Arthritis   . Breast cancer ([place]) 2010   Right  . Chronic kidney disease   . Colon polyp   . Depression   . Diabetes mellitus   . GERD (gastroesophageal reflux disease)   . Heart murmur   . History of kidney stones 1976  . HTN (hypertension)   . Hypothyroidism   . Osteopenia   . Pancreatitis   . Ulcerative esophagitis     Past Surgical History:  Procedure Laterality Date  . ABDOMINAL HYSTERECTOMY    . ABDOMINOPLASTY    . APPENDECTOMY    . BREAST BIOPSY Right 08/03/2008   Stereo Bx, Malignant  . BREAST LUMPECTOMY Right 08/26/2008  . CATARACT EXTRACTION W/ INTRAOCULAR LENS  IMPLANT, BILATERAL    . COSMETIC SURGERY    . ductal carcinoma     right breast   . EYE SURGERY    . HIP ARTHROPLASTY Right   . INSERTION OF DIALYSIS CATHETER Right 09/25/2012   Procedure: INSERTION OF DIALYSIS CATHETER;  Surgeon: Mal Misty, MD;  Location: [facility];  Service: Vascular;  Laterality: Right;  Righ Internal Jugular Placement  . JOINT REPLACEMENT    . 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 MICROLARYNGOSCOPY N/A 10/20/2019   Procedure: Suspended Microlaryngoscopy with Biopsy;  Surgeon: Melida Quitter, MD;  Location: [facility];  Service: ENT;  Laterality: N/A;  . RIGID ESOPHAGOSCOPY N/A 10/20/2019   Procedure: RIGID ESOPHAGOSCOPY;  Surgeon: Melida Quitter, MD;  Location: [facility];  Service: ENT;  Laterality: N/A;  . THYROIDECTOMY    . TUMMY TUCK      Family History  Problem Relation Age of Onset  . Thyroid cancer Daughter 32  . Diabetes Father   . Hypertension Father   . Diabetes Brother   . Hypertension Mother   . Hypertension Sister   . Colon polyps Brother   .  Atrial fibrillation Brother   . Dementia Sister   . Multiple myeloma Sister    Social History:  reports that she quit smoking about 36 years ago. She has never used smokeless tobacco. She reports current alcohol use. She reports that she does not use drugs.  Allergies:  Allergies  Allergen Reactions  . Penicillins Itching and Swelling    Swelling and flushing Has patient had a PCN reaction causing immediate rash, facial/tongue/throat swelling, SOB or lightheadedness with hypotension: yes Has patient had a PCN reaction causing severe rash involving mucus membranes or skin necrosis: no Has patient had a PCN reaction that required hospitalization: no Has patient had a PCN reaction occurring within the last 10 years: no If all of the above answers are "NO", then may proceed with Cephalosporin use.  . Banana Nausea Only    Green bananas   . Detrol [Tolterodine]     Leg cramps  . Quetiapine Other (See Comments)    Leg cramps/restless leg  . Risperidone And Related     Unknown reaction  . Lisinopril Cough    Medications Prior to Admission  Medication Sig Dispense Refill  . amLODipine (NORVASC) 10 MG tablet Take 10 mg by mouth at bedtime.    Marland Kitchen  Cholecalciferol (VITAMIN D3) 5000 UNITS TABS Take 5,000 Units by mouth 3 (three) times daily after meals.     Marland Kitchen glipiZIDE (GLUCOTROL XL) 2.5 MG 24 hr tablet Take 5 mg by mouth 2 (two) times daily.     Marland Kitchen levothyroxine (SYNTHROID) 200 MCG tablet Take 200 mcg by mouth daily before breakfast.     . losartan (COZAAR) 25 MG tablet Take 25 mg by mouth at bedtime.     . magnesium oxide (MAGOX 400) 400 (241.3 MG) MG tablet Take 400 mg by mouth 3 (three) times daily.     . Melatonin 5 MG CAPS Take 5 mg by mouth See admin instructions. Take 5 mg 2 hours before bedtime    . Misc Natural Products (GLUCOS-CHONDROIT-MSM COMPLEX PO) Take 1 tablet by mouth 3 (three) times daily.    . Multiple Vitamin (MULTIVITAMIN) tablet Take 1 tablet by mouth daily.    .  mycophenolate (MYFORTIC) 180 MG EC tablet Take 180 mg by mouth every 12 (twelve) hours.     Marland Kitchen omeprazole (PRILOSEC) 20 MG capsule Take 20 mg by mouth daily.    . Pancrelipase, Lip-Prot-Amyl, (CREON) 3000-9500 units CPEP Take 1 capsule by mouth 3 (three) times daily with meals.     Vladimir Faster Glycol-Propyl Glycol (SYSTANE OP) Place 1 drop into both eyes every evening.    . potassium chloride (MICRO-K) 10 MEQ CR capsule Take 10-20 mEq by mouth See admin instructions. Take 20 meq by mouth daily on Sunday, Tuesday, Thursday and Saturday, take 10 meq on Monday, Wednesday and Friday    . pramipexole (MIRAPEX) 0.25 MG tablet Take 0.25-0.5 mg by mouth See admin instructions. Take 0.5 mg 2 hours prior to bedtime then 0.25 mg at bedtime    . predniSONE (DELTASONE) 10 MG tablet Take 10 mg by mouth daily with breakfast.     . Probiotic Product (CULTURELLE PROBIOTICS PO) Take 1 capsule by mouth at bedtime.    . raloxifene (EVISTA) 60 MG tablet Take 60 mg by mouth every evening.     . rosuvastatin (CRESTOR) 5 MG tablet Take 1 tablet (5 mg total) by mouth 2 (two) times a week. (Patient taking differently: Take 5 mg by mouth 2 (two) times a week. Mondays and Fridays) 45 tablet 3  . sulfamethoxazole-trimethoprim (BACTRIM,SEPTRA) 400-80 MG tablet Take 1 tablet by mouth every Monday, Wednesday, and Friday.     . tacrolimus (PROGRAF) 0.5 MG capsule Take 0.5 mg by mouth every 12 (twelve) hours.     . tacrolimus (PROGRAF) 1 MG capsule Take 1 mg by mouth every 12 (twelve) hours.     . temazepam (RESTORIL) 15 MG capsule Take 15-30 mg by mouth at bedtime.     . vitamin B-12 (CYANOCOBALAMIN) 500 MCG tablet Take 500 mcg by mouth daily.    . collagenase (SANTYL) ointment Apply 1 application topically daily. (Patient not taking: Reported on 12/31/2019) 30 g 0  . Lancets (FREESTYLE) lancets by Does not apply route.    . ondansetron (ZOFRAN) 4 MG tablet Take 4 mg by mouth every 8 (eight) hours as needed for nausea or vomiting.      . Turmeric (QC TUMERIC COMPLEX PO) Take 750 mg by mouth daily as needed (joint pain).       Results for orders placed or performed during the hospital encounter of 01/09/20 (from the past 48 hour(s))  Glucose, capillary     Status: Abnormal   Collection Time: 01/09/20  6:27 AM  Result Value Ref Range  Glucose-Capillary 156 (H) 70 - 99 mg/dL    Comment: Glucose reference range applies only to samples taken after fasting for at least 8 hours.   No results found.  Review of Systems  HENT: Positive for sore throat.   All other systems reviewed and are negative.   Blood pressure (!) 157/54, pulse 72, temperature 97.9 F (36.6 C), temperature source Oral, resp. rate 18, SpO2 100 %. Physical Exam Constitutional:      Appearance: Normal appearance. She is normal weight.  HENT:     Head: Normocephalic and atraumatic.     Right Ear: External ear normal.     Left Ear: External ear normal.     Nose: Nose normal.     Mouth/Throat:     Mouth: Mucous membranes are moist.     Pharynx: Oropharynx is clear.  Eyes:     Extraocular Movements: Extraocular movements intact.     Conjunctiva/sclera: Conjunctivae normal.     Pupils: Pupils are equal, round, and reactive to light.  Cardiovascular:     Rate and Rhythm: Normal rate.  Pulmonary:     Effort: Pulmonary effort is normal.  Skin:    General: Skin is warm.  Neurological:     General: No focal deficit present.     Mental Status: She is alert and oriented to person, place, and time.  Psychiatric:        Mood and Affect: Mood normal.        Behavior: Behavior normal.        Thought Content: Thought content normal.        Judgment: Judgment normal.      Assessment/Plan Throat pain, epiglottis mass  To OR for SMDL with biopsy.  Melida Quitter, MD 01/09/2020, 7:30 AM

## 2020-01-10 ENCOUNTER — Encounter (HOSPITAL_COMMUNITY): Payer: Self-pay | Admitting: Otolaryngology

## 2020-01-12 LAB — SURGICAL PATHOLOGY

## 2020-01-19 NOTE — Progress Notes (Signed)
Head and Neck Cancer Location of Tumor / Histology:  Invasive squamous cell carcinoma of epiglottis   Patient presented  with symptoms of: throat pain since February 2021. She was found to have an epiglottic mas and Dr. Redmond Baseman performed a biopsy in July 2021 that was unrevealing.   Biopsies revealed:  01/09/2020 FINAL MICROSCOPIC DIAGNOSIS:  A. MOUTH, EPIGLOTTIC MASS, BIOPSY:  - Dysplastic squamous mucosa, cannot rule out high-grade dysplasia.  - See comment.  B. MOUTH, LEFT ARYEPIGLOTTIC FOLD MASS, BIOPSY:  - Invasive squamous cell carcinoma.  10/20/2019 FINAL MICROSCOPIC DIAGNOSIS:  A. EPIGLOTTIS, LEFT, BIOPSY:  - Benign squamous mucosa.  - No dysplasia or carcinoma.  B. EPIGLOTTIS, RIGHT, BIOPSY:  - Squamous mucosa with focal mild dysplasia.  - No evidence of invasive carcinoma.  C. EPIGLOTTIS, ANTERIOR, BIOPSY:  - Squamous mucosa with focal moderate dysplasia.  - No evidence of invasive carcinoma  Nutrition Status Yes No Comments  Weight changes? []  [x]    Swallowing concerns? [x]  []  Reports food is starting to become more difficult to swallow  PEG? []  [x]     Referrals Yes No Comments  Social Work? [x]  []    Dentistry? [x]  []    Swallowing therapy? [x]  []    Nutrition? [x]  []    Med/Onc? []  [x]     Safety Issues Yes No Comments  Prior radiation? []  [x]    Pacemaker/ICD? []  [x]    Possible current pregnancy? []  [x]  Abdominal hysterectomy  Is the patient on methotrexate? []  [x]     Tobacco/Marijuana/Snuff/ETOH use: Reports that she quit smoking about 40 years ago. She has never used smokeless tobacco. She reports occasional alcohol use (wine). She reports that she does not use drugs.  Past/Anticipated interventions by otolaryngology, if any:  01/09/2020 Dr. Melida Quitter Suspended microdirect laryngoscopy with CO2 laser and biopsy 10/20/2019 Dr. Melida Quitter Suspended microdirect laryngoscopy with biopsy, rigid esophagoscopy  Past/Anticipated interventions by medical  oncology, if any: No referral placed at this time  Current Complaints / other details:   History of right sided breast cancer with lumpectomy in 08/2008. Patient declined radiation and was released back to primary care from Dr. Lurline Del in 08/2013. Patient received bilateral kidney transplant in 09/2013, and has intestinal issues from surgery.

## 2020-01-19 NOTE — Progress Notes (Signed)
Oncology Nurse Navigator Documentation  Placed introductory call to new referral patient Anita Pollard  Introduced myself as the H&N oncology nurse navigator that works with Dr. Isidore Moos to whom she has been referred by Dr. Redmond Baseman. She confirmed understanding of referral.  Briefly explained my role as his navigator, provided my contact information.   Confirmed understanding of upcoming appts and [place] location, explained arrival and registration process.  I explained the purpose of a dental evaluation prior to starting RT, indicated she would be contacted by [facility] to arrange an appt.    I encouraged her to call with questions/concerns as she moves forward with appts and procedures.    She verbalized understanding of information provided, expressed appreciation for my call.   Navigator Initial Assessment . Employment Status: retired . Currently on FMLA / STD: no . Living Situation: She lives alone. She is widowed . Support System: Children . PCP: Lawerance Cruel . PCD: Dr. Randol Kern . Financial Concerns: yes . Transportation Needs: no . Sensory Deficits: no . Language Barriers/Interpreter Needed:  no . Ambulation Needs: no . DME Used in Home: no . Psychosocial Needs:  no . Concerns/Needs Understanding Cancer:  addressed/answered by navigator to best of ability . Self-Expressed Needs: no   Harlow Asa RN, BSN, OCN Head & Neck Oncology Nurse [place] [facility] Phone # [phone]  Fax # [phone]

## 2020-01-20 ENCOUNTER — Ambulatory Visit
Admission: RE | Admit: 2020-01-20 | Discharge: 2020-01-20 | Disposition: A | Discharge status: Home / Self Care | Payer: Medicare Other | Source: Ambulatory Visit | Attending: Radiation Oncology | Admitting: Radiation Oncology

## 2020-01-20 ENCOUNTER — Other Ambulatory Visit: Payer: Self-pay

## 2020-01-20 VITALS — BP 151/63 | HR 68 | Temp 97.2°F | Resp 18 | Ht 61.0 in | Wt 128.1 lb

## 2020-01-20 DIAGNOSIS — Z923 Personal history of irradiation: Secondary | ICD-10-CM | POA: Insufficient documentation

## 2020-01-20 DIAGNOSIS — C321 Malignant neoplasm of supraglottis: Secondary | ICD-10-CM

## 2020-01-20 DIAGNOSIS — Z8521 Personal history of malignant neoplasm of larynx: Secondary | ICD-10-CM | POA: Diagnosis present

## 2020-01-20 MED ORDER — LARYNGOSCOPY SOLUTION RAD-ONC
15.0000 mL | Freq: Once | TOPICAL | Status: AC
  Administered 2020-01-20: 15 mL via TOPICAL
  Filled 2020-01-20: qty 15

## 2020-01-20 NOTE — Progress Notes (Signed)
Radiation Oncology         [phone] ________________________________  Initial outpatient Consultation  Name: Anita Pollard MRN: [id number]  Date: 01/20/2020  DOB: [birth year]  VQ:MGQQ, Dwyane Luo, MD  Melida Quitter, MD   REFERRING PHYSICIAN: Melida Quitter, MD  DIAGNOSIS:    ICD-10-CM   1. Squamous cell carcinoma of epiglottis (HCC)  C32.1 laryngocopy solution for Rad-Onc    Fiberoptic laryngoscopy    Ambulatory referral to Social Work  2. 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    Cancer Staging Malignant neoplasm of supraglottis ([place]) Staging form: Larynx - Supraglottis, AJCC 8th Edition - Clinical stage from 01/20/2020: Stage Unknown (cT2, cNX, cM0) - Signed by Eppie Gibson, MD on 01/21/2020   CHIEF COMPLAINT: Here to discuss management of throat cancer  HISTORY OF PRESENT ILLNESS::Anita Pollard is a 79 y.o. female who presented with ongoing right-sided throat pain since February.  She had a delay in seeking medical attention because of the deaths of 2 sisters.  Ultimately she saw Dr. Redmond Baseman at the end of June.  Fiberoptic exam demonstrated a concerning lesion of the left supraglottic larynx.  She underwent neck CT on 10/08/2019.  This showed nodular thickening at the tip of the epiglottis.  There was a nonspecific 7 mm groundglass nodule in the right upper lobe of the lung.  No adenopathy in the neck.   Subsequently, the patient saw Dr. Redmond Baseman who performed a suspended microdirect laryngoscopy with biopsy and rigid esophagoscopy on the date of 10/20/2019. Pathology from the procedure revealed squamous mucosa with focal mild to moderate dysplasia, but no evidence of invasive carcinoma, of the right and anterior epiglottis. Left epiglottis showed benign squamous mucosa without dysplasia or carcinoma.  The patient underwent a second suspected microdirect laryngoscopy with CO2 laser and biopsy on the date of 01/09/2020. Pathology from the procedure revealed invasive  squamous cell carcinoma of the left aryepiglottic fold mass and dysplastic squamous mucosa (could not rule out high-grade dysplasia) of the epiglottic mass.  She saw Dr. Hendricks Limes at [facility] otol[facility]ology for second opinion.  He endorsed radiation therapy as a definitive option.   Nutrition Status Yes No Comments  Weight changes? '[]'  '[x]'    Swallowing concerns? '[x]'  '[]'  Reports food is starting to become more difficult to swallow  PEG? '[]'  '[x]'     Referrals Yes No Comments  Social Work? '[x]'  '[]'    Dentistry? '[x]'  '[]'    Swallowing therapy? '[x]'  '[]'    Nutrition? '[x]'  '[]'    Med/Onc? '[]'  '[x]'     Safety Issues Yes No Comments  Prior radiation? '[]'  '[x]'    Pacemaker/ICD? '[]'  '[x]'    Possible current pregnancy? '[]'  '[x]'  Abdominal hysterectomy  Is the patient on methotrexate? '[]'  '[x]'     Tobacco/Marijuana/Snuff/ETOH use: Reports that she quit smoking about 40 years ago. She has never used smokeless tobacco. She reports occasional alcohol use (wine). She reports that she does not use drugs.   Current Complaints / other details:   History of right sided breast cancer with lumpectomy in 08/2008. Patient declined radiation and was released back to primary care from Dr. Lurline Del in 08/2013. Patient received bilateral kidney transplant in 09/2013, and has intestinal issues from surgery.  She has been vaccinated for Covid, including a booster shot.  PREVIOUS RADIATION THERAPY: No  PAST MEDICAL HISTORY:  has a past medical history of Alcohol abuse, Anemia, Arthritis, Breast cancer ([place]) (2010), Chronic kidney disease, Colon polyp, Depression, Diabetes mellitus, GERD (gastroesophageal reflux disease), Heart murmur, History of kidney  stones (1976), HTN (hypertension), Hypothyroidism, Osteopenia, Pancreatitis, and Ulcerative esophagitis.    PAST SURGICAL HISTORY: Past Surgical History:  Procedure Laterality Date  . ABDOMINAL HYSTERECTOMY    . ABDOMINOPLASTY    . APPENDECTOMY    . BREAST BIOPSY Right 08/03/2008    Stereo Bx, Malignant  . BREAST LUMPECTOMY Right 08/26/2008  . CATARACT EXTRACTION W/ INTRAOCULAR LENS  IMPLANT, BILATERAL    . CO2 LASER APPLICATION N/A 83/15/1761   Procedure: CO2 LASER APPLICATION;  Surgeon: Melida Quitter, MD;  Location: [facility];  Service: ENT;  Laterality: N/A;  . COSMETIC SURGERY    . ductal carcinoma     right breast   . EYE SURGERY    . HIP ARTHROPLASTY Right   . INSERTION OF DIALYSIS CATHETER Right 09/25/2012   Procedure: INSERTION OF DIALYSIS CATHETER;  Surgeon: Mal Misty, MD;  Location: [facility];  Service: Vascular;  Laterality: Right;  Righ Internal Jugular Placement  . JOINT REPLACEMENT    . 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 MICROLARYNGOSCOPY N/A 10/20/2019   Procedure: Suspended Microlaryngoscopy with Biopsy;  Surgeon: Melida Quitter, MD;  Location: [facility] OR;  Service: ENT;  Laterality: N/A;  . MICROLARYNGOSCOPY N/A 01/09/2020   Procedure: MICROLARYNGOSCOPY WITH BIOPSY;  Surgeon: Melida Quitter, MD;  Location: [facility];  Service: ENT;  Laterality: N/A;  . RIGID ESOPHAGOSCOPY N/A 10/20/2019   Procedure: RIGID ESOPHAGOSCOPY;  Surgeon: Melida Quitter, MD;  Location: [facility];  Service: ENT;  Laterality: N/A;  . THYROIDECTOMY    . TUMMY TUCK      FAMILY HISTORY: family history includes Atrial fibrillation in her brother; Colon polyps in her brother; Dementia in her sister; Diabetes in her brother and father; Hypertension in her father, mother, and sister; Multiple myeloma in her sister; Thyroid cancer (age of onset: 47) in her daughter.  SOCIAL HISTORY:  reports that she quit smoking about 36 years ago. She has never used smokeless tobacco. She reports current alcohol use. She reports that she does not use drugs.  ALLERGIES: Penicillins, Banana, Detrol [tolterodine], Quetiapine, Risperidone and related, and Lisinopril  MEDICATIONS:  Current Outpatient Medications  Medication Sig Dispense Refill  . amLODipine (NORVASC) 10 MG tablet Take 10 mg by mouth at bedtime.    .  Cholecalciferol (VITAMIN D3) 5000 UNITS TABS Take 5,000 Units by mouth 3 (three) times daily after meals.     Marland Kitchen glipiZIDE (GLUCOTROL XL) 2.5 MG 24 hr tablet Take 5 mg by mouth 2 (two) times daily.     . Lancets (FREESTYLE) lancets by Does not apply route.    Marland Kitchen levothyroxine (SYNTHROID) 200 MCG tablet Take 200 mcg by mouth daily before breakfast.     . losartan (COZAAR) 25 MG tablet Take 25 mg by mouth at bedtime.     . magnesium oxide (MAGOX 400) 400 (241.3 MG) MG tablet Take 400 mg by mouth 3 (three) times daily.     . Melatonin 5 MG CAPS Take 5 mg by mouth See admin instructions. Take 5 mg 2 hours before bedtime    . Misc Natural Products (GLUCOS-CHONDROIT-MSM COMPLEX PO) Take 1 tablet by mouth 3 (three) times daily.    . Multiple Vitamin (MULTIVITAMIN) tablet Take 1 tablet by mouth daily.    . mycophenolate (MYFORTIC) 180 MG EC tablet Take 180 mg by mouth every 12 (twelve) hours.     Marland Kitchen omeprazole (PRILOSEC) 20 MG capsule Take 20 mg by mouth daily.    . ondansetron (ZOFRAN) 4 MG tablet Take 4 mg  by mouth every 8 (eight) hours as needed for nausea or vomiting.     . Pancrelipase, Lip-Prot-Amyl, (CREON) 3000-9500 units CPEP Take 1 capsule by mouth 3 (three) times daily with meals.     Vladimir Faster Glycol-Propyl Glycol (SYSTANE OP) Place 1 drop into both eyes every evening.    . potassium chloride (MICRO-K) 10 MEQ CR capsule Take 10-20 mEq by mouth See admin instructions. Take 20 meq by mouth daily on Sunday, Tuesday, Thursday and Saturday, take 10 meq on Monday, Wednesday and Friday    . pramipexole (MIRAPEX) 0.25 MG tablet Take 0.25-0.5 mg by mouth See admin instructions. Take 0.5 mg 2 hours prior to bedtime then 0.25 mg at bedtime    . predniSONE (DELTASONE) 10 MG tablet Take 10 mg by mouth daily with breakfast.     . Probiotic Product (CULTURELLE PROBIOTICS PO) Take 1 capsule by mouth at bedtime.    . raloxifene (EVISTA) 60 MG tablet Take 60 mg by mouth every evening.     . rosuvastatin  (CRESTOR) 5 MG tablet Take 1 tablet (5 mg total) by mouth 2 (two) times a week. (Patient taking differently: Take 5 mg by mouth 2 (two) times a week. Mondays and Fridays) 45 tablet 3  . sulfamethoxazole-trimethoprim (BACTRIM,SEPTRA) 400-80 MG tablet Take 1 tablet by mouth every Monday, Wednesday, and Friday.     . tacrolimus (PROGRAF) 0.5 MG capsule Take 0.5 mg by mouth every 12 (twelve) hours.     . tacrolimus (PROGRAF) 1 MG capsule Take 1 mg by mouth every 12 (twelve) hours.     . temazepam (RESTORIL) 15 MG capsule Take 15-30 mg by mouth at bedtime.     . Turmeric (QC TUMERIC COMPLEX PO) Take 750 mg by mouth daily as needed (joint pain).     . vitamin B-12 (CYANOCOBALAMIN) 500 MCG tablet Take 500 mcg by mouth daily.     No current facility-administered medications for this encounter.    REVIEW OF SYSTEMS:  Notable for that above.   PHYSICAL EXAM:  height is '5\' 1"'  (1.549 m) and weight is 128 lb 2 oz (58.1 kg). Her temporal temperature is 97.2 F (36.2 C) (abnormal). Her blood pressure is 151/63 (abnormal) and her pulse is 68. Her respiration is 18 and oxygen saturation is 98%.   General: Alert and oriented, in no acute distress, slightly hoarse HEENT: Head is normocephalic. Extraocular movements are intact. Oropharynx is notable for no lesions in the upper throat. Neck: Neck is notable for no palpable adenopathy but she does have some tenderness to palpation in the left cervical neck Heart: Regular in rate and rhythm with no murmurs, rubs, or gallops. Chest: Clear to auscultation bilaterally, with no rhonchi, wheezes, or rales. Abdomen: Soft, nontender, nondistended, with no rigidity or guarding. Extremities: No cyanosis or edema. Lymphatics: see Neck Exam Skin: No concerning lesions. Musculoskeletal: symmetric strength and muscle tone throughout. Neurologic: Cranial nerves II through XII are grossly intact. No obvious focalities. Speech is fluent. Coordination is intact. Psychiatric:  Judgment and insight are intact. Affect is appropriate.  PROCEDURE NOTE: After obtaining consent and anesthetizing the nasal cavity with topical lidocaine and phenylephrine, the flexible endoscope was introduced and passed through the nasal cavity.  No lesions in the nasopharynx, base of tongue, vallecula.  Supraglottis is notable for swelling and thickening at the tip of the epiglottis with somewhat irregular appearing mucosa; at the left aryepiglottic fold there is evidence of residual tumor at the biopsy site, see photo below.  True cords are symmetrically mobile without nodularity     ECOG = 1  0 - Asymptomatic (Fully active, able to carry on all predisease activities without restriction)  1 - Symptomatic but completely ambulatory (Restricted in physically strenuous activity but ambulatory and able to carry out work of a light or sedentary nature. For example, light housework, office work)  2 - Symptomatic, <50% in bed during the day (Ambulatory and capable of all self care but unable to carry out any work activities. Up and about more than 50% of waking hours)  3 - Symptomatic, >50% in bed, but not bedbound (Capable of only limited self-care, confined to bed or chair 50% or more of waking hours)  4 - Bedbound (Completely disabled. Cannot carry on any self-care. Totally confined to bed or chair)  5 - Death   Eustace Pen MM, Creech RH, Tormey DC, et al. [phone]). "Toxicity and response criteria of the [facility]". [place] Oncol. 5 (6): 649-55   LABORATORY DATA:  Lab Results  Component Value Date   WBC 7.6 01/07/2020   HGB 11.8 (L) 01/07/2020   HCT 37.7 01/07/2020   MCV 98.2 01/07/2020   PLT 183 01/07/2020   CMP     Component Value Date/Time   NA 137 01/07/2020 1105   NA 141 05/01/2018 0749   NA 140 08/28/2013 0955   K 4.7 01/07/2020 1105   K 3.0 (LL) 08/28/2013 0955   CL 101 01/07/2020 1105   CL 111 (H) 09/03/2012 1249   CO2 25 01/07/2020 1105   CO2  16 (L) 08/28/2013 0955   GLUCOSE 258 (H) 01/07/2020 1105   GLUCOSE 110 08/28/2013 0955   GLUCOSE 214 (H) 09/03/2012 1249   BUN 28 (H) 01/07/2020 1105   BUN 25 05/01/2018 0749   BUN 75.7 (H) 08/28/2013 0955   CREATININE 1.10 (H) 01/07/2020 1105   CREATININE 5.9 (HH) 08/28/2013 0955   CALCIUM 9.6 01/07/2020 1105   CALCIUM 7.5 (L) 08/28/2013 0955   PROT 6.5 05/01/2018 0749   PROT 5.7 (L) 08/28/2013 0955   ALBUMIN 4.5 05/01/2018 0749   ALBUMIN 2.6 (L) 08/28/2013 0955   AST 19 05/01/2018 0749   AST 15 08/28/2013 0955   ALT 20 05/01/2018 0749   ALT 17 08/28/2013 0955   ALKPHOS 66 05/01/2018 0749   ALKPHOS 62 08/28/2013 0955   BILITOT 0.4 05/01/2018 0749   BILITOT 0.38 08/28/2013 0955   GFRNONAA 47 (L) 01/07/2020 1105   GFRAA 56 (L) 05/01/2018 0749      Lab Results  Component Value Date   TSH 0.163 (L) 09/25/2012     RADIOGRAPHY: as above I personally reviewed her imaging  IMPRESSION/PLAN:  This is a delightful patient with throat cancer in his significant medical history including history of kidney transplant. I recommend radiotherapy for this patient.  Anticipate 7 weeks of definitive treatment for her.  We will discuss her at tumor board.  At this point in time I do not see a role for chemotherapy and she wishes to defer medical oncology consultation unless it is recommended by tumor board.  However, she does need up-to-date staging scans.  I recommend a PET scan and we will try to arrange this as soon as possible.  This will help [place] discern if she has metastatic disease but also help [place] discern if she has any metastatic adenopathy.  Her last neck CT was 3 months ago and she now has some tenderness in the left neck that is  nonspecific.  We discussed the potential risks, benefits, and side effects of radiotherapy. We talked in detail about acute and late effects. We discussed that some of the most bothersome acute effects may be mucositis, dysgeusia, salivary changes, skin  irritation, hair loss, dehydration, weight loss and fatigue. We talked about late effects which include but are not necessarily limited to dysphagia, hypothyroidism, nerve injury, spinal cord injury, xerostomia, neck edema, and potential injuries to anywhere in the head and neck region. No guarantees of treatment were given. A consent form was signed and placed in the patient's medical record. The patient is enthusiastic about proceeding with treatment. I look forward to participating in the patient's care.    Simulation (treatment planning) will take place after staging scans and dentistry clearance  We also discussed that the treatment of head and neck cancer is a multidisciplinary process to maximize treatment outcomes and quality of life. For this reason the following referrals have been or will be made:   Dentistry for dental evaluation, possible extractions in the radiation fields, and /or advice on reducing risk of cavities, osteoradionecrosis, or other oral issues.  Anticipate I will be able to stay off of her tooth roots fairly aggressively so I do not think that extractions will be necessary to proceed with radiation.  She reports that she already has baseline dry mouth   Nutritionist for nutrition support during and after treatment.  We discussed the possible role that a feeding tube may need to play if she cannot maintain her weight.   Speech language pathology for swallowing and/or speech therapy.  Right now she has some mild swallowing difficulty.  I do not see an obvious role for a MBSS but I will order that if recommended   Social work for social support.    Physical therapy due to risk of lymphedema in neck and deconditioning.   Baseline labs including TSH.  On date of service, in total, I spent 65 minutes on this encounter. Patient was seen in person.  __________________________________________   Eppie Gibson, MD  This document serves as a record of services personally  performed by Eppie Gibson, MD. It was created on his behalf by Clerance Lav, a trained medical scribe. The creation of this record is based on the scribe's personal observations and the provider's statements to them. This document has been checked and approved by the attending provider.

## 2020-01-21 ENCOUNTER — Telehealth: Payer: Self-pay | Admitting: *Deleted

## 2020-01-21 ENCOUNTER — Other Ambulatory Visit: Payer: Self-pay

## 2020-01-21 DIAGNOSIS — C321 Malignant neoplasm of supraglottis: Secondary | ICD-10-CM | POA: Insufficient documentation

## 2020-01-21 DIAGNOSIS — R5383 Other fatigue: Secondary | ICD-10-CM

## 2020-01-21 DIAGNOSIS — R5381 Other malaise: Secondary | ICD-10-CM

## 2020-01-21 DIAGNOSIS — Z1329 Encounter for screening for other suspected endocrine disorder: Secondary | ICD-10-CM

## 2020-01-21 NOTE — Progress Notes (Signed)
Dental Form with Estimates of Radiation Dose      Diagnosis:    ICD-10-CM   1. Squamous cell carcinoma of epiglottis (HCC)  C32.1 laryngocopy solution for Rad-Onc    Fiberoptic laryngoscopy    Ambulatory referral to Social Work  2. 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

## 2020-01-21 NOTE — Telephone Encounter (Signed)
Called patient to inform of Pet Scan for 01-29-20 - arrival time- 11 am @ [facility], patient to be NPO- after midnight, patient to avoid carbs and sugars and hold morning insulin, spoke with patient and she is aware of this test

## 2020-01-21 NOTE — Addendum Note (Signed)
Encounter addended by: Eppie Gibson, MD on: 01/21/2020 8:14 AM  Actions taken: Level of Service modified

## 2020-01-22 ENCOUNTER — Other Ambulatory Visit: Payer: Self-pay

## 2020-01-22 ENCOUNTER — Encounter: Payer: Self-pay | Admitting: General Practice

## 2020-01-22 ENCOUNTER — Ambulatory Visit (HOSPITAL_COMMUNITY): Payer: Self-pay | Admitting: Dentistry

## 2020-01-22 DIAGNOSIS — Z01818 Encounter for other preprocedural examination: Secondary | ICD-10-CM | POA: Diagnosis not present

## 2020-01-22 DIAGNOSIS — C321 Malignant neoplasm of supraglottis: Secondary | ICD-10-CM | POA: Diagnosis present

## 2020-01-22 NOTE — Patient Instructions (Signed)
[facility] Dr. Debe Coder B. Autie Vasudevan Phone: [phone] Fax: [phone]   It was a pleasure seeing you today!  Please refer to the information below regarding your dental visit with [place], and call us should you have any questions or concerns that may come up after you leave.   Thank you for giving [place] the opportunity to provide care for you.  If there is anything we can do for you, please let us know.     RADIATION THERAPY AND INFORMATION REGARDING YOUR TEETH   [x] Xerostomia (Dry Mouth) Your salivary glands may be in the field of radiation.  Radiation may include all or only part of your salivary glands.  This will cause your saliva to dry up, and you will have a dry mouth.  The dry mouth will be for the rest of your life unless your radiation oncologist tells you otherwise.  Your saliva has many functions:  It wets your tongue for speaking.  It coats your teeth and the inside of your mouth for easier movement.  It helps with chewing and swallowing food.  It helps clean away harmful acid and toxic products made by the germs in your mouth, therefore it helps prevent cavities.  It kills some germs in your mouth and helps to prevent gum disease.  It helps to carry flavor to your taste buds.  Once you have lost your saliva you will be at higher risk for tooth decay and gum disease.    What can be done to help improve your mouth when there's not enough saliva:  Your dentist may give a prescription for Salagen.  It will not bring back all of your saliva but may bring back some of it.  Also, your saliva may be thick and ropy or white and foamy. It will not feel like it use to feel.  You will need to swish with water every time your mouth feels dry.  YOU CANNOT suck on any cough drops, mints, lemon drops, candy, vitamin C or any other products.  You cannot use anything other than water to make your mouth feel less dry.  If you want to drink anything else,  you have to drink it all at once and brush afterwards.  Be sure to discuss the details of your diet habits with your dentist or hygienist.  [x] Radiation caries:  This is decay (cavities) that happens very quickly once your mouth is very dry due to radiation therapy.  Normally, cavities take six months to two years to become a problem.  When you have dry mouth, cavities may take as little as eight weeks to cause you a problem.    Dental check-ups every two months are necessary as long as you have a dry mouth. Radiation caries typically, but not always, start at your gum line where it is hard to see the cavity.  It is therefore also hard to fill these cavities adequately.  This high rate of cavities happens because your mouth no longer has saliva and therefore the acid made by the germs starts the decay process.  Whenever you eat anything the germs in your mouth change the food into acid.  The acid then burns a small hole in your tooth.  This small hole is the beginning of a cavity.  If this is not treated then it will grow bigger and become a cavity.  The way to avoid this hole getting bigger is to use fluoride every evening as prescribed by your  dentist following your radiation.   NOTE:  You have to make sure that your teeth are very clean before you use the fluoride.  This fluoride in turn will strengthen your teeth and prepare them for another day of fighting acid.  If you develop radiation caries many times, the damage is so large that you will have to have all your teeth removed.  This could be a big problem if some of these teeth are in the field of radiation.  Further details of why this could be a big problem will follow.  (See Osteoradionecrosis).  [x] Dysgeusia (Loss of Taste) This happens to varying degrees once you've had radiation therapy to your jaw region.  Many times taste is not completely lost, but becomes limited.  The loss of taste is mostly due to radiation affecting your taste buds.   However, if you have no saliva in your mouth to carry the flavor to your taste buds, it would be difficult for your taste buds to taste anything.  That is why using water or a prescription for Salagen prior to meals and during meal times may help with some of the taste.  Keep in mind that taste generally returns very slowly over the course of several months or several years after radiation therapy.  Don't give up hope.  [x] Trismus (Limited Jaw Opening) According to your Radiation Oncologist, your TMJ or jaw joints are going to be partially or fully in the field of radiation.  This means that over time the muscles that help you open and close your mouth may get stiff.  This will potentially result in your not being able to open your mouth wide enough or as wide as you can open it now.    Let me give you an example of how slowly this happens and how unaware people are of it:   A gentlemen that had radiation therapy two years ago came back to me complaining that bananas are just too large for him to be able to fit them in between his teeth.  He was not able to open wide enough to bite into a banana.  This happens slowly and over a period of time.  What we do to try and prevent this:    Your dentist will probably give you a stack of sticks called a trismus exercise device.  This stack will help remind your muscles and your jaw joints to open up to the same distance every day.  Use these sticks every morning when you wake up, or according to the instructions given by your dentist.     You must use these sticks for at least one to two years after radiation therapy.  The reason for that is because it happens so slowly and keeps going on for about two years after radiation therapy.  Your hospital dentist will help you monitor your mouth opening and make sure that it's not getting smaller after radiation.  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  It has very little blood supply to keep it alive.  If you develop a cavity that turns into an abscess or an infection, then the jaw bone does not have enough blood supply to help fight the infection.  At this point it is very likely that the infection could cause the death of your jaw bone.  When you have dead bone it has to be removed.  Therefore, you might end up having  to have surgery to remove part of your jaw bone, the part of the jaw bone that has been affected.     Healing is also a problem if you are to have surgery (like a tooth extraction) in the areas where the bone has had radiation therapy.  If you have surgery, you need more blood supply to heal which is not available.  When blood supply and oxygen are not available, there is a chance for the bone to die.  Occasionally, ORN happens on its own with no obvious reason, but this is quite rare.  We believe that patients who continue to smoke and/or drink alcohol have a higher chance of having this problem.  Once your jaw bone has had radiation therapy, if there are any remaining teeth in that area, it is not recommended to have them pulled unless your dentist or oral surgeon is aware of your history of radiation and believes it is safe.   The risks for ORN either from infection or spontaneously occurring (with no reason) are life long.

## 2020-01-22 NOTE — Progress Notes (Signed)
DENTAL VISIT OUTPATIENT CONSULTATION  Service Date:   01/22/2020 Referring Provider:                  Eppie Gibson, MD  Patient Name:   Anita Pollard Date of Birth:   [birth year] Medical Record Number: [id number]   ___________________________________________________  PLAN/RECOMMENDATIONS: Patient is cleared to undergo radiation therapy from a dental perspective at this time.  There are no invasive dental procedures that are recommended prior to moving forward with her treatment.  Recommend patient return to our clinic for a follow-up visit 1 month after completion of radiation therapy and then return to her outside dentist for periodic exams and cleanings every 4-6 months.  Discussed in detail the findings, assessment and treatment options with the patient and she is agreeable to the plan.   Thank you for consulting with [facility] and for the opportunity to participate in this kind patient's treatment.  Should you have any questions or concerns, please contact the [facility] at [phone]. ____________________________________________________   COVID 19 SCREENING: The patient denies symptoms concerning for COVID-19 infection including fever, chills, cough, or newly developed shortness of breath.   HPI: Anita Pollard is a very pleasant 79 y.o. 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  She has h/o anemia, HTN, heart murmur, arthritis, GERD, Diabetes Mellitus Type 2, Hypothyroidism, osteopenia, pancreatitis, alcohol abuse and ulcerative esophagitis.  Dentistry was consulted for an evaluation as part of her Pre-Radiation work-up.  Patient denies fever, rigors and malaise. Dental History: Patient reports that she does have a dentist that she sees regularly.  Her dentist is Dr. Maryruth Hancock A. Randol Kern located in [place].  She last went for an appointment in November of 2020 for a cleaning and exam.  She reports  that since then, she has broken a tooth on the top right d/t her surgery where she thinks it was impacted by the procedure.  She denies any pain or sensitivity with the tooth, and also denies any other dental/oral pain or sensitivity.  Currently, she has an appointment for a cleaning and exam in November. Patient able to manage oral secretions.  Patient denies dysphagia, dysphonia, SOB and neck pain.  She does report odynophagia that she thinks started last January (2021) and has gotten worse since her diagnosis.   CHIEF COMPLAINT: "I have a broken tooth on the top right that I noticed after my last surgery."   Patient Active Problem List   Diagnosis Date Noted   Malignant neoplasm of supraglottis ([place]) 01/21/2020   Pelvic mass in female 06/05/2013   DM2 (diabetes mellitus, type 2) ([place]) 09/24/2012   AKI (acute kidney injury) ([place]) 09/24/2012   Elevated serum creatinine 09/03/2012   Anemia, unspecified 09/03/2012   Breast cancer ([place]) 08/31/2011   HYPERTENSION, BENIGN 02/28/2010   Carotid artery disease ([place]) 01/26/2010   Past Medical History:  Diagnosis Date   Alcohol abuse    Anemia    Arthritis    Breast cancer ([place]) 2010   Right   Chronic kidney disease    Colon polyp    Depression    Diabetes mellitus    GERD (gastroesophageal reflux disease)    Heart murmur    History of kidney stones 1976   HTN (hypertension)    Hypothyroidism    Osteopenia    Pancreatitis    Ulcerative esophagitis    Past Surgical History:  Procedure Laterality Date   ABDOMINAL HYSTERECTOMY  ABDOMINOPLASTY     APPENDECTOMY     BREAST BIOPSY Right 08/03/2008   Stereo Bx, Malignant   BREAST LUMPECTOMY Right 08/26/2008   CATARACT EXTRACTION W/ INTRAOCULAR LENS  IMPLANT, BILATERAL     CO2 LASER APPLICATION N/A 28/36/6294   Procedure: CO2 LASER APPLICATION;  Surgeon: Melida Quitter, MD;  Location: [facility] OR;  Service: ENT;  Laterality: N/A;   COSMETIC SURGERY     ductal carcinoma     right breast     EYE SURGERY     HIP ARTHROPLASTY Right    INSERTION OF DIALYSIS CATHETER Right 09/25/2012   Procedure: INSERTION OF DIALYSIS CATHETER;  Surgeon: Mal Misty, MD;  Location: [facility] OR;  Service: Vascular;  Laterality: Right;  Righ Internal Jugular Placement   JOINT REPLACEMENT     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   MICROLARYNGOSCOPY N/A 10/20/2019   Procedure: Suspended Microlaryngoscopy with Biopsy;  Surgeon: Melida Quitter, MD;  Location: [facility];  Service: ENT;  Laterality: N/A;   MICROLARYNGOSCOPY N/A 01/09/2020   Procedure: MICROLARYNGOSCOPY WITH BIOPSY;  Surgeon: Melida Quitter, MD;  Location: [facility];  Service: ENT;  Laterality: N/A;   RIGID ESOPHAGOSCOPY N/A 10/20/2019   Procedure: RIGID ESOPHAGOSCOPY;  Surgeon: Melida Quitter, MD;  Location[facility] OR;  Service: ENT;  Laterality: N/A;   THYROIDECTOMY     TUMMY TUCK     Allergies  Allergen Reactions   Penicillins Itching and Swelling    Swelling and flushing Has patient had a PCN reaction causing immediate rash, facial/tongue/throat swelling, SOB or lightheadedness with hypotension: yes Has patient had a PCN reaction causing severe rash involving mucus membranes or skin necrosis: no Has patient had a PCN reaction that required hospitalization: no Has patient had a PCN reaction occurring within the last 10 years: no If all of the above answers are "NO", then may proceed with Cephalosporin use.   Banana Nausea Only    Green bananas    Detrol [Tolterodine]     Leg cramps   Quetiapine Other (See Comments)    Leg cramps/restless leg   Risperidone And Related     Unknown reaction   Lisinopril Cough   Current Outpatient Medications  Medication Sig Dispense Refill   amLODipine (NORVASC) 10 MG tablet Take 10 mg by mouth at bedtime.     Cholecalciferol (VITAMIN D3) 5000 UNITS TABS Take 5,000 Units by mouth 3 (three) times daily after meals.      glipiZIDE (GLUCOTROL XL) 2.5 MG 24 hr tablet Take 5 mg by mouth 2 (two) times daily.       Lancets (FREESTYLE) lancets by Does not apply route.     levothyroxine (SYNTHROID) 200 MCG tablet Take 200 mcg by mouth daily before breakfast.      losartan (COZAAR) 25 MG tablet Take 25 mg by mouth at bedtime.      magnesium oxide (MAGOX 400) 400 (241.3 MG) MG tablet Take 400 mg by mouth 3 (three) times daily.      Melatonin 5 MG CAPS Take 5 mg by mouth See admin instructions. Take 5 mg 2 hours before bedtime     Misc Natural Products (GLUCOS-CHONDROIT-MSM COMPLEX PO) Take 1 tablet by mouth 3 (three) times daily.     Multiple Vitamin (MULTIVITAMIN) tablet Take 1 tablet by mouth daily.     mycophenolate (MYFORTIC) 180 MG EC tablet Take 180 mg by mouth every 12 (twelve) hours.      omeprazole (PRILOSEC) 20 MG capsule Take 20 mg by mouth daily.  ondansetron (ZOFRAN) 4 MG tablet Take 4 mg by mouth every 8 (eight) hours as needed for nausea or vomiting.      Pancrelipase, Lip-Prot-Amyl, (CREON) 3000-9500 units CPEP Take 1 capsule by mouth 3 (three) times daily with meals.      Polyethyl Glycol-Propyl Glycol (SYSTANE OP) Place 1 drop into both eyes every evening.     potassium chloride (MICRO-K) 10 MEQ CR capsule Take 10-20 mEq by mouth See admin instructions. Take 20 meq by mouth daily on Sunday, Tuesday, Thursday and Saturday, take 10 meq on Monday, Wednesday and Friday     pramipexole (MIRAPEX) 0.25 MG tablet Take 0.25-0.5 mg by mouth See admin instructions. Take 0.5 mg 2 hours prior to bedtime then 0.25 mg at bedtime     predniSONE (DELTASONE) 10 MG tablet Take 10 mg by mouth daily with breakfast.      Probiotic Product (CULTURELLE PROBIOTICS PO) Take 1 capsule by mouth at bedtime.     raloxifene (EVISTA) 60 MG tablet Take 60 mg by mouth every evening.      rosuvastatin (CRESTOR) 5 MG tablet Take 1 tablet (5 mg total) by mouth 2 (two) times a week. (Patient taking differently: Take 5 mg by mouth 2 (two) times a week. Mondays and Fridays) 45 tablet 3   sulfamethoxazole-trimethoprim  (BACTRIM,SEPTRA) 400-80 MG tablet Take 1 tablet by mouth every Monday, Wednesday, and Friday.      tacrolimus (PROGRAF) 0.5 MG capsule Take 0.5 mg by mouth every 12 (twelve) hours.      tacrolimus (PROGRAF) 1 MG capsule Take 1 mg by mouth every 12 (twelve) hours.      temazepam (RESTORIL) 15 MG capsule Take 15-30 mg by mouth at bedtime.      Turmeric (QC TUMERIC COMPLEX PO) Take 750 mg by mouth daily as needed (joint pain).      vitamin B-12 (CYANOCOBALAMIN) 500 MCG tablet Take 500 mcg by mouth daily.     No current facility-administered medications for this visit.    LABS: Lab Results  Component Value Date   WBC 7.6 01/07/2020   HGB 11.8 (L) 01/07/2020   HCT 37.7 01/07/2020   MCV 98.2 01/07/2020   PLT 183 01/07/2020      Component Value Date/Time   NA 137 01/07/2020 1105   NA 141 05/01/2018 0749   NA 140 08/28/2013 0955   K 4.7 01/07/2020 1105   K 3.0 (LL) 08/28/2013 0955   CL 101 01/07/2020 1105   CL 111 (H) 09/03/2012 1249   CO2 25 01/07/2020 1105   CO2 16 (L) 08/28/2013 0955   GLUCOSE 258 (H) 01/07/2020 1105   GLUCOSE 110 08/28/2013 0955   GLUCOSE 214 (H) 09/03/2012 1249   BUN 28 (H) 01/07/2020 1105   BUN 25 05/01/2018 0749   BUN 75.7 (H) 08/28/2013 0955   CREATININE 1.10 (H) 01/07/2020 1105   CREATININE 5.9 (HH) 08/28/2013 0955   CALCIUM 9.6 01/07/2020 1105   CALCIUM 7.5 (L) 08/28/2013 0955   GFRNONAA 47 (L) 01/07/2020 1105   GFRAA 56 (L) 05/01/2018 0749   Lab Results  Component Value Date   INR 1.13 09/30/2012   INR 1.19 09/25/2012   INR 1.28 09/25/2012   No results found for: PTT  Social History   Socioeconomic History   Marital status: Widowed    Spouse name: Not on file   Number of children: Not on file   Years of education: Not on file   Highest education level: Not on file  Occupational  History   Not on file  Tobacco Use   Smoking status: Former Smoker    Quit date: 09/25/1983    Years since quitting: 36.3   Smokeless tobacco: Never Used   Vaping Use   Vaping Use: Never used  Substance and Sexual Activity   Alcohol use: Yes    Comment: socially history of heavy drinking with intervention x 2-at least 2 occasions since april '14   Drug use: No   Sexual activity: Never  Other Topics Concern   Not on file  Social History Narrative   Not on file   Social Determinants of Health   Financial Resource Strain:    Difficulty of Paying Living Expenses: Not on file  Food Insecurity:    Worried About Charity fundraiser in the Last Year: Not on file   [employer] of Food in the Last Year: Not on file  Transportation Needs:    Lack of Transportation (Medical): Not on file   Lack of Transportation (Non-Medical): Not on file  Physical Activity:    Days of Exercise per Week: Not on file   Minutes of Exercise per Session: Not on file  Stress:    Feeling of Stress : Not on file  Social Connections:    Frequency of Communication with Friends and Family: Not on file   Frequency of Social Gatherings with Friends and Family: Not on file   Attends Religious Services: Not on file   Active Member of Clubs or Organizations: Not on file   Attends Archivist Meetings: Not on file   Marital Status: Not on file  Intimate Partner Violence:    Fear of Current or Ex-Partner: Not on file   Emotionally Abused: Not on file   Physically Abused: Not on file   Sexually Abused: Not on file   Family History  Problem Relation Age of Onset   Thyroid cancer Daughter 3   Diabetes Father    Hypertension Father    Diabetes Brother    Hypertension Mother    Hypertension Sister    Colon polyps Brother    Atrial fibrillation Brother    Dementia Sister    Multiple myeloma Sister      REVIEW OF SYSTEMS: Reviewed with the patient as per HPI. PSYCH: Patient denies having dental phobia.   VITAL SIGNS: BP (!) 146/58 (BP Location: Right Arm)   Pulse 68   Temp 97.8 F (36.6 C)    PHYSICAL EXAMINATION: GENERAL: Well-developed,  comfortable and in no apparent distress. NEUROLOGICAL: Alert and oriented to person, place and time. EXTRAORAL:  Facial symmetry present without any edema or erythema.  No swelling or lymphadenopathy noted upon clinical exam. INTRAORAL: Soft tissues appear well-perfused and mucous membranes moist.  Mild xerostomia at baseline.  FOM and vestibules soft and not raised. Oral cavity without mass or lesion. No signs of infection, parulis, sinus tract, edema or erythema.  Bilateral mandibular tori.  DENTAL EXAMINATION: DENTITION: Missing teeth, moderately restored remaining dentition, anterior maxillary and mandibular attrition, Class V abfractions on maxillary molars and premolars without hypersensitivity.   The patient is maintaining good oral hygiene. PERIODONTAL: Pink, healthy gingival tissue with blunted papilla.  Generalized gingival recession.  Mild calculus accumulation on mandibular teeth lingual surfaces. DENTAL CARIES/DEFECTIVE RESTORATIONS: #2 distal caries, #14 potential recurrent caries under existing crown.  #3 fractured mesiobuccal cusp into dentin with no obvious pulpal exposure or blushing. ENDODONTIC: Multiple teeth with previous RCT.  #9 and #11 previous RCT without definitive  crowns. CROWN/BRIDGE: Multiple teeth with existing PFM/ceramic full coverage crowns.  #14 with likely recurrent decay under crown margin. ORTHODONTIC: Fixed Lingual retainer on #8 and #9.  Patient reports having braces several times in the past. OCCLUSION: Class I molar occlusion.  #15 non-functioning tooth and supra-eruption.  RADIOGRAPHIC EXAMINATION PAN Full Mouth Series exposed and interpreted: Condyles seated bilaterally in fossas.  No evidence of abnormal pathology.  All visualized osseous structures appear WNL. Small radiopaque circular regions bilaterally above the ramus, consistent with possible tonsillolith (although high for this dx) or phlebolith. Generalized mild horizontal bone loss. Light  radiographic calculus accumulation evident. Missing teeth, caries, existing restorations and crowns. #8, #11 and #14 previously Endo treated, #8 and #11 without definitive crowns. #14 crown margin appears radiolucent indicating either open margin or recurrent decay. #19 has bone loss through the furcation and what appears to be restorative material underneath crown at tooth's furcation possibly from previously attempted RCT with perforation. #8 and #9 radiopaque retainer. #3 fractured/broken tooth.     ASSESSMENT 1. SCC of the Epiglottis  2. Anemia 3. Arthritis 4. HTN 5. Type 2 Diabetes Mellitus 6. GERD 7. Heart Murmur 8. Hypothyroidism 9. Osteopenia 10. Ulcerative esophagitis 11. Pre-Radiation Consultation 12. Missing teeth 13. Caries 14. Abfraction/Flexure 15. Gingival Recession 16. 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 Odynophagia   PLAN/RECOMMENDATIONS  I discussed the risks, benefits, and complications of various treatment options with the patient in relationship to her medical (anticipated radiation therapy to the head and neck) and dental conditions. We discussed side affects of radiation therapy, preventative and maintenance strategies for side effects (including but not limited to dry mouth, trismus and radiation caries), as well as the importance of returning to the dentist every 4-6 months following XRT for exams and close monitoring of her oral health.  I discussed the various treatment options including fabrication of fluoride trays to use following XRT, no treatment, or returning to her dentist for all recommended dental care following radiation.  The patient verbalized understanding of all treatment options and elected to return to her dentist for fluoride tray fabrication and routine exams/cleanings. At this time, there is no invasive dental surgery recommended prior to patient starting radiation. There are no questionable or concerning teeth in the field of radiation, no  signs of acute or chronic infection or severe dental decay.  The patient is cleared from a dental perspective at this time.  Discussion of findings with medical team and coordination of future medical and dental care as needed.  Patient is to return to our dental clinic 1 month after completion of radiation for a follow-up visit and then return to her regular dentist.  Of reference, her dentist's contact information is:           Dr. Maryruth Hancock A. [address], [place], [place] [address]                 P: [phone]                 F: [phone]    Patient tolerated today's visit well.  All questions/concerns were addressed and the patient departed in stable condition.   I spent in excess of 120 minutes during the conduct of this consultation and >50% of this time involved direct face-to-face encounter for counseling and/or coordination of the patient's care.   Rutledge Benson [place], DMD

## 2020-01-22 NOTE — Progress Notes (Signed)
[place] Psychosocial Distress Screening Clinical Social Work  Clinical Social Work was referred by distress screening protocol.  The patient scored a 9 on the Psychosocial Distress Thermometer which indicates severe distress. Clinical Social Worker contacted patient by phone to assess for distress and other psychosocial needs. Patients main concern is difficulty swallowing, especially pills.  :"When it first started, I thought it was a sore throat.  I had two deaths in the family."  Put her medical care to the side due to deaths of sisters and large family reunion planned 6 months in advance.  Is concerned that her ear is now hurting.  Also wants to let team know that her sister died of multiple myeloma.    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  "Some pills seem to get stuck in my throat."  Also is on myfortic (kidney rejection medicine) and cannot eat within two hours of taking this so cannot use food to help pills go down.  Many of her pills are "large and I cant do without them - those are some of the ones that keep me going."  Is diabetic, struggles to control her blood pressure.    Lives alone and she likes it that way.  Does have several friends, but they are blind.  Daughter and grandchildren are an hour away - son is unvaccinated and lives in [place].    Has upper left arm injury, is waiting for PT evaluation currently.  In a lot of pain.  Wants to continue to do her own cooking, shopping, house cleaning throughout treatment.  Does not like to depend on others.   ONCBCN DISTRESS SCREENING 01/20/2020  Screening Type Initial Screening  Distress experienced in past week (1-10) 9  Family Problem type Children  Emotional problem type Nervousness/Anxiety;Adjusting to illness;Isolation/feeling alone;Feeling hopeless  Spiritual/Religous concerns type Facing my mortality;Loss of sense of purpose  Information Concerns Type Lack of info about diagnosis;Lack of info  about treatment;Lack of info about complementary therapy choices  Physical Problem type Pain;Sleep/insomnia;Mouth sores/swallowing;Loss of appetitie  Physician notified of physical symptoms Yes  Referral to clinical psychology No  Referral to clinical social work Yes  Referral to dietition Yes  Referral to financial advocate Yes  Referral to support programs Yes  Referral to palliative care No    Clinical Social Worker follow up needed: No.  If yes, follow up plan:  Beverely Pace, [place], LCSW Clinical Social Worker Phone:  [phone]

## 2020-01-23 NOTE — Progress Notes (Signed)
Oncology Nurse Navigator Documentation  I called and spoke with [name] regarding her recently scheduled CT simulation appointment. She is aware and agreeable to the appointment. She knows to call me if she has any further questions or concerns.  Anita Asa RN, BSN, OCN Head & Neck Oncology Nurse [place] at [facility] Phone # [phone]  Fax # [phone]

## 2020-01-29 ENCOUNTER — Other Ambulatory Visit: Payer: Self-pay

## 2020-01-29 ENCOUNTER — Ambulatory Visit
Admission: RE | Admit: 2020-01-29 | Discharge: 2020-01-29 | Disposition: A | Discharge status: Home / Self Care | Payer: Medicare Other | Source: Ambulatory Visit | Attending: Radiation Oncology | Admitting: Radiation Oncology

## 2020-01-29 DIAGNOSIS — K861 Other chronic pancreatitis: Secondary | ICD-10-CM | POA: Diagnosis not present

## 2020-01-29 DIAGNOSIS — I2510 Atherosclerotic heart disease of native coronary artery without angina pectoris: Secondary | ICD-10-CM | POA: Diagnosis not present

## 2020-01-29 DIAGNOSIS — I700 Atherosclerosis of aorta: Secondary | ICD-10-CM | POA: Insufficient documentation

## 2020-01-29 DIAGNOSIS — R918 Other nonspecific abnormal finding of lung field: Secondary | ICD-10-CM | POA: Diagnosis not present

## 2020-01-29 DIAGNOSIS — C321 Malignant neoplasm of supraglottis: Secondary | ICD-10-CM

## 2020-01-29 DIAGNOSIS — K5730 Diverticulosis of large intestine without perforation or abscess without bleeding: Secondary | ICD-10-CM | POA: Diagnosis not present

## 2020-01-29 LAB — GLUCOSE, CAPILLARY: Glucose-Capillary: 254 mg/dL — ABNORMAL HIGH (ref 70–99)

## 2020-01-29 MED ORDER — FLUDEOXYGLUCOSE F - 18 (FDG) INJECTION
6.6000 | Freq: Once | INTRAVENOUS | Status: AC | PRN
  Administered 2020-01-29: 6.36 via INTRAVENOUS

## 2020-01-30 ENCOUNTER — Ambulatory Visit
Admission: RE | Admit: 2020-01-30 | Discharge: 2020-01-30 | Disposition: A | Discharge status: Home / Self Care | Payer: Medicare Other | Source: Ambulatory Visit | Attending: Radiation Oncology | Admitting: Radiation Oncology

## 2020-01-30 ENCOUNTER — Inpatient Hospital Stay: Payer: Medicare Other | Attending: Radiation Oncology

## 2020-01-30 ENCOUNTER — Other Ambulatory Visit: Payer: Self-pay

## 2020-01-30 DIAGNOSIS — R5381 Other malaise: Secondary | ICD-10-CM

## 2020-01-30 DIAGNOSIS — R5383 Other fatigue: Secondary | ICD-10-CM | POA: Insufficient documentation

## 2020-01-30 DIAGNOSIS — C321 Malignant neoplasm of supraglottis: Secondary | ICD-10-CM | POA: Insufficient documentation

## 2020-01-30 DIAGNOSIS — Z510 Encounter for antineoplastic radiation therapy: Secondary | ICD-10-CM | POA: Diagnosis present

## 2020-01-30 DIAGNOSIS — Z8371 Family history of colonic polyps: Secondary | ICD-10-CM | POA: Insufficient documentation

## 2020-01-30 DIAGNOSIS — Z87891 Personal history of nicotine dependence: Secondary | ICD-10-CM | POA: Diagnosis not present

## 2020-01-30 DIAGNOSIS — Z833 Family history of diabetes mellitus: Secondary | ICD-10-CM | POA: Diagnosis not present

## 2020-01-30 DIAGNOSIS — Z8249 Family history of ischemic heart disease and other diseases of the circulatory system: Secondary | ICD-10-CM | POA: Diagnosis not present

## 2020-01-30 DIAGNOSIS — I700 Atherosclerosis of aorta: Secondary | ICD-10-CM | POA: Diagnosis not present

## 2020-01-30 DIAGNOSIS — Z818 Family history of other mental and behavioral disorders: Secondary | ICD-10-CM | POA: Diagnosis not present

## 2020-01-30 DIAGNOSIS — Z1329 Encounter for screening for other suspected endocrine disorder: Secondary | ICD-10-CM

## 2020-01-30 DIAGNOSIS — E860 Dehydration: Secondary | ICD-10-CM | POA: Insufficient documentation

## 2020-01-30 DIAGNOSIS — K5730 Diverticulosis of large intestine without perforation or abscess without bleeding: Secondary | ICD-10-CM | POA: Diagnosis not present

## 2020-01-30 DIAGNOSIS — E876 Hypokalemia: Secondary | ICD-10-CM | POA: Insufficient documentation

## 2020-01-30 DIAGNOSIS — I951 Orthostatic hypotension: Secondary | ICD-10-CM | POA: Insufficient documentation

## 2020-01-30 DIAGNOSIS — I2510 Atherosclerotic heart disease of native coronary artery without angina pectoris: Secondary | ICD-10-CM | POA: Insufficient documentation

## 2020-01-30 DIAGNOSIS — Z808 Family history of malignant neoplasm of other organs or systems: Secondary | ICD-10-CM | POA: Diagnosis not present

## 2020-01-30 DIAGNOSIS — E119 Type 2 diabetes mellitus without complications: Secondary | ICD-10-CM | POA: Insufficient documentation

## 2020-01-30 DIAGNOSIS — Z79899 Other long term (current) drug therapy: Secondary | ICD-10-CM | POA: Diagnosis not present

## 2020-01-30 DIAGNOSIS — R1310 Dysphagia, unspecified: Secondary | ICD-10-CM | POA: Insufficient documentation

## 2020-01-30 LAB — BASIC METABOLIC PANEL - CANCER CENTER ONLY
Anion gap: 9 (ref 5–15)
BUN: 31 mg/dL — ABNORMAL HIGH (ref 8–23)
CO2: 17 mmol/L — ABNORMAL LOW (ref 22–32)
Calcium: 8.1 mg/dL — ABNORMAL LOW (ref 8.9–10.3)
Chloride: 110 mmol/L (ref 98–111)
Creatinine: 1.15 mg/dL — ABNORMAL HIGH (ref 0.44–1.00)
GFR, Estimated: 48 mL/min — ABNORMAL LOW (ref >60)
Glucose, Bld: 316 mg/dL — ABNORMAL HIGH (ref 70–99)
Potassium: 3 mmol/L — ABNORMAL LOW (ref 3.5–5.1)
Sodium: 136 mmol/L (ref 135–145)

## 2020-01-30 MED ORDER — SODIUM CHLORIDE 0.9 % IV SOLN
INTRAVENOUS | Status: AC
  Filled 2020-01-30 (×2): qty 250

## 2020-01-30 NOTE — Patient Instructions (Signed)

## 2020-02-02 ENCOUNTER — Other Ambulatory Visit: Payer: Self-pay

## 2020-02-02 ENCOUNTER — Telehealth: Payer: Self-pay

## 2020-02-02 ENCOUNTER — Other Ambulatory Visit: Payer: Self-pay | Admitting: Medical

## 2020-02-02 ENCOUNTER — Encounter: Payer: Self-pay | Admitting: Nutrition

## 2020-02-02 ENCOUNTER — Inpatient Hospital Stay: Payer: Medicare Other | Admitting: Nutrition

## 2020-02-02 DIAGNOSIS — R7989 Other specified abnormal findings of blood chemistry: Secondary | ICD-10-CM

## 2020-02-02 DIAGNOSIS — C321 Malignant neoplasm of supraglottis: Secondary | ICD-10-CM

## 2020-02-02 DIAGNOSIS — D649 Anemia, unspecified: Secondary | ICD-10-CM

## 2020-02-02 LAB — TSH: TSH: 0.402 u[IU]/mL (ref 0.308–3.960)

## 2020-02-02 NOTE — Progress Notes (Signed)
Oncology Nurse Navigator Documentation  I spoke to Ms. Heick today regarding the scheduling of her PEG placement. She is scheduled for the PEG on 11/16 at 10:00 am. She will go for her COVID test on 11/13 at 11:05 am. She is aware and agreeable to these appointments. I will meet with her after her first radiation treatment on 11/15 and provide PEG education. She knows to call me if she has any further questions or concerns.   Harlow Asa RN, BSN, OCN Head & Neck Oncology Nurse [place] at [facility] Phone # [phone]  Fax # [phone]

## 2020-02-02 NOTE — Progress Notes (Signed)
Patient did not show up for nutrition appointment today.  I have asked Web designer to reschedule.

## 2020-02-02 NOTE — Telephone Encounter (Signed)
Called patient to see if she was available/willing to come in and meet with Alison Murray as an additional layer of support while she received XRT treatment. She stated she would be willing to come in on Tuesday 02/03/20 at 13:00 for first visit. Will send high priority scheduling message to have patient added to Van's schedule

## 2020-02-03 ENCOUNTER — Inpatient Hospital Stay: Payer: Medicare Other

## 2020-02-03 ENCOUNTER — Telehealth: Payer: Self-pay | Admitting: *Deleted

## 2020-02-03 ENCOUNTER — Other Ambulatory Visit: Payer: Self-pay | Admitting: Medical

## 2020-02-03 ENCOUNTER — Ambulatory Visit (HOSPITAL_COMMUNITY): Payer: Medicare Other

## 2020-02-03 ENCOUNTER — Inpatient Hospital Stay (HOSPITAL_BASED_OUTPATIENT_CLINIC_OR_DEPARTMENT_OTHER): Payer: Medicare Other | Admitting: Medical

## 2020-02-03 ENCOUNTER — Other Ambulatory Visit: Payer: Self-pay

## 2020-02-03 VITALS — BP 152/63 | HR 65 | Temp 98.4°F | Resp 16 | Ht 61.0 in | Wt 126.5 lb

## 2020-02-03 DIAGNOSIS — D649 Anemia, unspecified: Secondary | ICD-10-CM

## 2020-02-03 DIAGNOSIS — R7989 Other specified abnormal findings of blood chemistry: Secondary | ICD-10-CM

## 2020-02-03 DIAGNOSIS — I951 Orthostatic hypotension: Secondary | ICD-10-CM | POA: Diagnosis not present

## 2020-02-03 DIAGNOSIS — E860 Dehydration: Secondary | ICD-10-CM | POA: Diagnosis not present

## 2020-02-03 DIAGNOSIS — C321 Malignant neoplasm of supraglottis: Secondary | ICD-10-CM

## 2020-02-03 DIAGNOSIS — E876 Hypokalemia: Secondary | ICD-10-CM | POA: Diagnosis not present

## 2020-02-03 LAB — CBC WITH DIFFERENTIAL (CANCER CENTER ONLY)
Abs Immature Granulocytes: 0.03 10*3/uL (ref 0.00–0.07)
Basophils Absolute: 0 10*3/uL (ref 0.0–0.1)
Basophils Relative: 0 %
Eosinophils Absolute: 0 10*3/uL (ref 0.0–0.5)
Eosinophils Relative: 0 %
HCT: 34.5 % — ABNORMAL LOW (ref 36.0–46.0)
Hemoglobin: 11.4 g/dL — ABNORMAL LOW (ref 12.0–15.0)
Immature Granulocytes: 0 %
Lymphocytes Relative: 12 %
Lymphs Abs: 0.9 10*3/uL (ref 0.7–4.0)
MCH: 31.1 pg (ref 26.0–34.0)
MCHC: 33 g/dL (ref 30.0–36.0)
MCV: 94.3 fL (ref 80.0–100.0)
Monocytes Absolute: 0.2 10*3/uL (ref 0.1–1.0)
Monocytes Relative: 3 %
Neutro Abs: 6.2 10*3/uL (ref 1.7–7.7)
Neutrophils Relative %: 85 %
Platelet Count: 174 10*3/uL (ref 150–400)
RBC: 3.66 MIL/uL — ABNORMAL LOW (ref 3.87–5.11)
RDW: 13.3 % (ref 11.5–15.5)
WBC Count: 7.4 10*3/uL (ref 4.0–10.5)
nRBC: 0 % (ref 0.0–0.2)

## 2020-02-03 LAB — CMP (CANCER CENTER ONLY)
ALT: 12 U/L (ref 0–44)
AST: 12 U/L — ABNORMAL LOW (ref 15–41)
Albumin: 4 g/dL (ref 3.5–5.0)
Alkaline Phosphatase: 55 U/L (ref 38–126)
Anion gap: 7 (ref 5–15)
BUN: 36 mg/dL — ABNORMAL HIGH (ref 8–23)
CO2: 23 mmol/L (ref 22–32)
Calcium: 8.3 mg/dL — ABNORMAL LOW (ref 8.9–10.3)
Chloride: 107 mmol/L (ref 98–111)
Creatinine: 1.34 mg/dL — ABNORMAL HIGH (ref 0.44–1.00)
GFR, Estimated: 40 mL/min — ABNORMAL LOW (ref >60)
Glucose, Bld: 283 mg/dL — ABNORMAL HIGH (ref 70–99)
Potassium: 3.4 mmol/L — ABNORMAL LOW (ref 3.5–5.1)
Sodium: 137 mmol/L (ref 135–145)
Total Bilirubin: 0.5 mg/dL (ref 0.3–1.2)
Total Protein: 6.4 g/dL — ABNORMAL LOW (ref 6.5–8.1)

## 2020-02-03 MED ORDER — POTASSIUM CHLORIDE IN NACL 20-0.9 MEQ/L-% IV SOLN
Freq: Once | INTRAVENOUS | Status: AC
  Filled 2020-02-03: qty 1000

## 2020-02-03 NOTE — Telephone Encounter (Signed)
Called patient to inform of nutrition appt. on 02-03-20 @ 2:30 pm, lvm for a return call

## 2020-02-03 NOTE — Progress Notes (Signed)
Nutrition Assessment:  Patient with supraglottis cancer.  Planning radiation treatment.  PEG to be placed on 11/16.  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  Patient reports that she has been severely restricting carbohydrates to better control blood glucose.  Has appointment with endocrinology tomorrow.  Reports for breakfast eating deli ham and cheese. Lunch 2 1/2 pieces of [place] wrapped in cheese.  Supper last night was grilled tuna and green beans.  Reports that she does have trouble swallowing thin liquids.  Has been using thick it to thicken thin liquids.    Medications: glucotrol, synthroid, mag ox, MVI, prilosec, zofran, creon  Labs: glucose 238, K 3.4, BUN 36, creatinine 1.34  Anthropometrics:   Height: 61 inches Weight: 126 lb today UBW: 132 in June 2021 BMI: 24  4% weight loss in the last 5 months, not significant but concerning   Estimated Energy Needs  Kcals: 1700-1900 Protein: 85-95 g Fluid: 1.7 L  NUTRITION DIAGNOSIS: Predicted suboptimal energy intake related to cancer and related treatment as evidenced by planning PEG tube placement to provide nutrition   INTERVENTION:  Concerned about patient's nutritional status and severely restricting carbohydrate intake along with current diagnosis of cancer and planned treatment/side effects.  Would recommend liberalizing diet and controlling blood glucose with medications.   Discussed feeding tube with patient.  Nursing will provide more education next week before tube is placed.   Contact information provided      MONITORING, EVALUATION, GOAL: weight trends, intake, tube feeding tolerance   NEXT VISIT: Wednesday, Nov 17 prior to radiation with [place]. Zenia Resides, [place], [place] Registered Dietitian [phone] (mobile)

## 2020-02-03 NOTE — Patient Instructions (Signed)

## 2020-02-05 NOTE — Progress Notes (Signed)
Oncology Nurse Navigator Documentation  I called and spoke with Ms. Safer regarding several questions she had about her upcoming radiation treatment. I also advised her that on 11/18 she will stay after her radiation treatment to meet with SLP and PT. She is agreeable to this. She knows to call me if she has any further questions or concerns.   Harlow Asa RN, BSN, OCN Head & Neck Oncology Nurse [place] at [facility] Phone # [phone]  Fax # [phone]

## 2020-02-06 DIAGNOSIS — Z510 Encounter for antineoplastic radiation therapy: Secondary | ICD-10-CM | POA: Diagnosis not present

## 2020-02-07 ENCOUNTER — Other Ambulatory Visit (HOSPITAL_COMMUNITY)
Admission: RE | Admit: 2020-02-07 | Discharge: 2020-02-07 | Disposition: A | Discharge status: Home / Self Care | Payer: Medicare Other | Source: Ambulatory Visit | Attending: Radiation Oncology | Admitting: Radiation Oncology

## 2020-02-07 DIAGNOSIS — Z01812 Encounter for preprocedural laboratory examination: Secondary | ICD-10-CM | POA: Insufficient documentation

## 2020-02-07 DIAGNOSIS — Z20822 Contact with and (suspected) exposure to covid-19: Secondary | ICD-10-CM | POA: Insufficient documentation

## 2020-02-07 LAB — SARS CORONAVIRUS 2 (TAT 6-24 HRS): SARS Coronavirus 2: NEGATIVE

## 2020-02-09 ENCOUNTER — Other Ambulatory Visit: Payer: Self-pay

## 2020-02-09 ENCOUNTER — Other Ambulatory Visit: Payer: Self-pay | Admitting: Student

## 2020-02-09 ENCOUNTER — Other Ambulatory Visit (HOSPITAL_COMMUNITY): Payer: Self-pay

## 2020-02-09 ENCOUNTER — Ambulatory Visit
Admission: RE | Admit: 2020-02-09 | Discharge: 2020-02-09 | Disposition: A | Discharge status: Home / Self Care | Payer: Medicare Other | Source: Ambulatory Visit | Attending: Radiation Oncology | Admitting: Radiation Oncology

## 2020-02-09 DIAGNOSIS — Z510 Encounter for antineoplastic radiation therapy: Secondary | ICD-10-CM | POA: Diagnosis not present

## 2020-02-09 DIAGNOSIS — C321 Malignant neoplasm of supraglottis: Secondary | ICD-10-CM

## 2020-02-09 DIAGNOSIS — R1310 Dysphagia, unspecified: Secondary | ICD-10-CM

## 2020-02-09 DIAGNOSIS — R059 Cough, unspecified: Secondary | ICD-10-CM

## 2020-02-09 MED ORDER — SONAFINE EX EMUL
1.0000 "application " | Freq: Two times a day (BID) | CUTANEOUS | Status: DC
  Administered 2020-02-09: 1 via TOPICAL

## 2020-02-09 NOTE — Progress Notes (Signed)
Oncology Nurse Navigator Documentation  To provide support, encouragement and care continuity, met with Ms. Anita Pollard for her initial RT.    I reviewed the 2-step treatment process, answered questions.   Ms. Anita Pollard completed treatment without difficulty, denied questions/concerns.  I reviewed the registration/arrival procedure for subsequent treatments.  I assisted her to her undertreat visit with Dr. Isidore Moos today and then provided PEG education as follows:  Met with  Anita Pollard to provide PEG education prior to tomorrow's placement.  Provided port educational handout, showed example, provided guidance for post-surgical dsg removal, site care.  . Using  PEG teaching device   and Teach Back, provided education for PEG use and care, including: hand hygiene, gravity bolus administration of daily water flushes and nutritional supplement, fluids and medications; care of tube insertion site including daily dressing change and cleaning; S&S of infection.   . Anita Pollard correctly verbalized procedures for and provided correct return demonstration of gravity administration of water, dressing change and site care.  . I provided written instructions for PEG flushing/dressing change in support of verbal instruction.   . I provided/described contents of Start of Care Bolus Feeding Kit (3 60 cc syringes, 2 boxes 4x4 drainage sponges, 1 package mesh briefs, 1 roll paper tape, 1 case Osmolite 1.5).  She voiced understanding she is to start using Osmolite per guidance of Nutrition. . She understands I will be available for ongoing PEG support. Provided barium sulfate prep which I obtained from [facility] and reviewed instructions. She tested negative for COVID-19 on 02/07/20.   Harlow Asa RN, BSN, OCN Head & Neck Oncology Nurse [place] at [facility] Phone # [phone]  Fax # [phone]     Harlow Asa RN, BSN, OCN Head & Neck Oncology Nurse  [place] at [facility] Phone # [phone]  Fax # [phone]

## 2020-02-09 NOTE — Progress Notes (Signed)
Oncology Nurse Navigator Documentation   At the request of Dr. Isidore Moos I have completed the following:  I notified the schedulers for Anita Pollard's MBSS that the procedure still hadn't been scheduled. They have contacted Anita Pollard and she has now been scheduled for her MBSS on 02/20/20.  I spoke with Tedra Coupe in Radiology and she has sent Anita Pollard's recent PET scan via [employer] to [facility]. I called and spoke with a nurse at Dr. Ronie Spies office and informed her that her most recent PET scan would be available to exam. I specifically discussed the renal calculus documented in her native left kidney. The nurse will notify Dr. Franchot Gallo.  Harlow Asa RN, BSN, OCN Head & Neck Oncology Nurse [place] at [facility] Phone # [phone]  Fax # [phone]

## 2020-02-09 NOTE — Progress Notes (Signed)
Pt here for patient teaching.    Pt given Radiation and You booklet, Managing Acute Radiation Side Effects for Head and Neck Cancer handout, skin care instructions and Sonafine.    Reviewed areas of pertinence such as fatigue, hair loss, mouth changes, skin changes, throat changes, earaches and taste changes .   Pt able to give teach back of to pat skin, use unscented/gentle soap and drink plenty of water,apply Sonafine bid and avoid applying anything to skin within 4 hours of treatment.   Pt demonstrated understanding and verbalizes understanding of information given and will contact nursing with any questions or concerns.    Http://[email or web address]/whattoexpect/index

## 2020-02-10 ENCOUNTER — Other Ambulatory Visit (HOSPITAL_COMMUNITY): Payer: Self-pay | Admitting: Radiology

## 2020-02-10 ENCOUNTER — Ambulatory Visit (HOSPITAL_COMMUNITY)
Admission: RE | Admit: 2020-02-10 | Discharge: 2020-02-10 | Disposition: A | Discharge status: Home / Self Care | Payer: Medicare Other | Source: Ambulatory Visit | Attending: Radiation Oncology | Admitting: Radiation Oncology

## 2020-02-10 ENCOUNTER — Ambulatory Visit
Admission: RE | Admit: 2020-02-10 | Discharge: 2020-02-10 | Disposition: A | Discharge status: Home / Self Care | Payer: Medicare Other | Source: Ambulatory Visit | Attending: Radiation Oncology | Admitting: Radiation Oncology

## 2020-02-10 DIAGNOSIS — K219 Gastro-esophageal reflux disease without esophagitis: Secondary | ICD-10-CM | POA: Diagnosis not present

## 2020-02-10 DIAGNOSIS — I120 Hypertensive chronic kidney disease with stage 5 chronic kidney disease or end stage renal disease: Secondary | ICD-10-CM | POA: Insufficient documentation

## 2020-02-10 DIAGNOSIS — Z880 Allergy status to penicillin: Secondary | ICD-10-CM | POA: Insufficient documentation

## 2020-02-10 DIAGNOSIS — Z87891 Personal history of nicotine dependence: Secondary | ICD-10-CM | POA: Insufficient documentation

## 2020-02-10 DIAGNOSIS — Z91018 Allergy to other foods: Secondary | ICD-10-CM | POA: Diagnosis not present

## 2020-02-10 DIAGNOSIS — Z79890 Hormone replacement therapy (postmenopausal): Secondary | ICD-10-CM | POA: Insufficient documentation

## 2020-02-10 DIAGNOSIS — E1122 Type 2 diabetes mellitus with diabetic chronic kidney disease: Secondary | ICD-10-CM | POA: Diagnosis not present

## 2020-02-10 DIAGNOSIS — Z510 Encounter for antineoplastic radiation therapy: Secondary | ICD-10-CM | POA: Diagnosis not present

## 2020-02-10 DIAGNOSIS — C760 Malignant neoplasm of head, face and neck: Secondary | ICD-10-CM | POA: Insufficient documentation

## 2020-02-10 DIAGNOSIS — Z888 Allergy status to other drugs, medicaments and biological substances status: Secondary | ICD-10-CM | POA: Diagnosis not present

## 2020-02-10 DIAGNOSIS — R1310 Dysphagia, unspecified: Secondary | ICD-10-CM | POA: Diagnosis not present

## 2020-02-10 DIAGNOSIS — Z7984 Long term (current) use of oral hypoglycemic drugs: Secondary | ICD-10-CM | POA: Diagnosis not present

## 2020-02-10 DIAGNOSIS — N186 End stage renal disease: Secondary | ICD-10-CM | POA: Diagnosis not present

## 2020-02-10 DIAGNOSIS — C321 Malignant neoplasm of supraglottis: Secondary | ICD-10-CM

## 2020-02-10 DIAGNOSIS — Z79899 Other long term (current) drug therapy: Secondary | ICD-10-CM | POA: Diagnosis not present

## 2020-02-10 HISTORY — PX: IR GASTROSTOMY TUBE MOD SED: IMG625

## 2020-02-10 LAB — CBC
HCT: 37.6 % (ref 36.0–46.0)
Hemoglobin: 12.1 g/dL (ref 12.0–15.0)
MCH: 31.7 pg (ref 26.0–34.0)
MCHC: 32.2 g/dL (ref 30.0–36.0)
MCV: 98.4 fL (ref 80.0–100.0)
Platelets: 207 10*3/uL (ref 150–400)
RBC: 3.82 MIL/uL — ABNORMAL LOW (ref 3.87–5.11)
RDW: 13.6 % (ref 11.5–15.5)
WBC: 8.4 10*3/uL (ref 4.0–10.5)
nRBC: 0 % (ref 0.0–0.2)

## 2020-02-10 LAB — GLUCOSE, CAPILLARY: Glucose-Capillary: 89 mg/dL (ref 70–99)

## 2020-02-10 LAB — APTT: aPTT: 23 seconds — ABNORMAL LOW (ref 24–36)

## 2020-02-10 LAB — PROTIME-INR
INR: 0.9 (ref 0.8–1.2)
Prothrombin Time: 12.1 seconds (ref 11.4–15.2)

## 2020-02-10 MED ORDER — MIDAZOLAM HCL 2 MG/2ML IJ SOLN
INTRAMUSCULAR | Status: AC | PRN
  Administered 2020-02-10 (×2): 1 mg via INTRAVENOUS

## 2020-02-10 MED ORDER — VANCOMYCIN HCL IN DEXTROSE 1-5 GM/200ML-% IV SOLN: 1000.0000 mg | Freq: Once | INTRAVENOUS | Status: AC

## 2020-02-10 MED ORDER — SODIUM CHLORIDE 0.9 % IV SOLN: INTRAVENOUS | Status: DC

## 2020-02-10 MED ORDER — VANCOMYCIN HCL IN DEXTROSE 1-5 GM/200ML-% IV SOLN
INTRAVENOUS | Status: AC
  Administered 2020-02-10: 1000 mg via INTRAVENOUS
  Filled 2020-02-10: qty 200

## 2020-02-10 MED ORDER — LIDOCAINE-EPINEPHRINE 1 %-1:100000 IJ SOLN
INTRAMUSCULAR | Status: AC
  Filled 2020-02-10: qty 1

## 2020-02-10 MED ORDER — GLUCAGON HCL RDNA (DIAGNOSTIC) 1 MG IJ SOLR
INTRAMUSCULAR | Status: AC
  Filled 2020-02-10: qty 1

## 2020-02-10 MED ORDER — LIDOCAINE-EPINEPHRINE 1 %-1:100000 IJ SOLN
INTRAMUSCULAR | Status: AC | PRN
  Administered 2020-02-10: 10 mL

## 2020-02-10 MED ORDER — IOHEXOL 300 MG/ML  SOLN
50.0000 mL | Freq: Once | INTRAMUSCULAR | Status: AC | PRN
  Administered 2020-02-10: 20 mL

## 2020-02-10 MED ORDER — HYDROCODONE-ACETAMINOPHEN 5-325 MG PO TABS: 1.0000 | ORAL_TABLET | Freq: Four times a day (QID) | ORAL | 0 refills | Status: DC | PRN

## 2020-02-10 MED ORDER — FENTANYL CITRATE (PF) 100 MCG/2ML IJ SOLN
INTRAMUSCULAR | Status: AC | PRN
Start: 2020-02-10 — End: 2020-02-10
  Administered 2020-02-10 (×2): 50 ug via INTRAVENOUS

## 2020-02-10 MED ORDER — MIDAZOLAM HCL 2 MG/2ML IJ SOLN
INTRAMUSCULAR | Status: AC
  Filled 2020-02-10: qty 4

## 2020-02-10 MED ORDER — FENTANYL CITRATE (PF) 100 MCG/2ML IJ SOLN
INTRAMUSCULAR | Status: AC
  Filled 2020-02-10: qty 2

## 2020-02-10 MED ORDER — GLUCAGON HCL (RDNA) 1 MG IJ SOLR
INTRAMUSCULAR | Status: AC | PRN
  Administered 2020-02-10: 1 mg via INTRAVENOUS

## 2020-02-10 NOTE — Procedures (Signed)
Pre procedure Dx: Head and neck cancer Post Procedure Dx: Same  Successful fluoroscopic guided insertion of gastrostomy tube.   The gastrostomy tube may be used immediately for medications.   Tube feeds may be initiated in 24 hours as per the primary team.    EBL: Minimal Complications: None immediate  Ronny Bacon, MD Pager #: [phone]

## 2020-02-10 NOTE — Discharge Instructions (Signed)
Liquids for 24 hours   How to Care for a Feeding Tube  A feeding tube is a tube used to give medicine, water, and liquid food. A person may have this tube if she or he has trouble swallowing or cannot take food or medicine. Supplies needed to care for the tube site:  Clean gloves.  Clean washcloth, gauze pads, or soft paper towel.  Cotton swabs.  A skin barrier ointment or cream, such as petroleum jelly.  Soap and water.  Pre-cut foam pads or gauze (for around the tube).  Tube tape.  An anchoring device (optional). How to care for the tube site  1. Have all supplies ready and close to you. 2. Wash your hands. 3. Put on gloves. 4. Change any pad or gauze near the tube if: ? It is dirty. ? It is wet. ? It has been there for more than one day. 5. Check the skin around the tube. Call the doctor if you see any of these: ? Red skin. ? A rash. ? Swelling. ? Leaking fluid. ? Extra skin. 6. Dip the gauze and cotton swabs in water and soap. 7. Use the cotton swabs to wipe the skin that is closest to the tube. 8. Use the gauze pads to wipe the rest of the skin near the tube. 9. Rinse with water. 10. Dry the area with a clean washcloth, dry gauze pad, or soft paper towel. 11. If the skin is red, use a cotton swab to put on a skin barrier cream or ointment. Put it on by making little circles. Do not apply antibiotic ointments at the tube site. 12. Put a new pre-cut foam pad or gauze around the tube. If there is no fluid at the tube site, you do not need a pad or gauze. 13. Tape down the edges. 14. Use tape or an anchoring device to attach the tube to the skin. Do this for comfort or as told. Each time you use tape, put it in a different place. 15. Sit the person up. 16. Throw away used supplies. 17. Take off your gloves. 18. Wash your hands. Supplies needed to flush a feeding tube:  Clean gloves.  A clean 60 mL syringe that connects to the feeding tube.  A  towel.  Germ-free (sterile) or purified water. Follow these rules: ? Use germ-free water if:  Your body's defense system (immune system) is weak and you have trouble getting better from infections (are immunocompromised).  You do not know how many chemicals are in your water. ? Do not use water from lakes or other bodies of water unless you treat it or filter it first. ? To make drinking water pure by boiling:  Boil water for 1 minute or longer. Keep a lid over the water while it boils.  Let the water cool off to room temperature before you use it. How to flush a feeding tube 1. Have all supplies ready and close to you. 2. Wash your hands. 3. Put on gloves. 4. Pull 30 mL of water into the syringe. 5. Before you push water into (flush) the tube, put the towel under the tube to catch any fluid leaks. 6. Bend (kink) the feeding tube while you do one of these things: ? Disconnect it from the feeding-bag tubing. ? Take off the cap at the end of the tube. 7. Put the tip of the syringe into the end of the feeding tube. 8. Stop bending the tube. 9. Use  the syringe to slowly put the water in the tube. If the water will not go in the tube: ? Have the person lie on his or her left side. ? Try putting the water in the tube again. ? Do not push hard to make the water go in. 10. Take out the syringe and put the cap on the tube. 11. Throw away used supplies. 12. Take off your gloves. 13. Wash your hands. Follow these instructions at home: Caring for the tube  If the person has a foam pad or gauze near the tube, change it: ? Every day. ? When it is dirty. ? When it is wet.  Do not put antibiotic ointments by the tube. Flushing the tube  Do not use a syringe that is smaller than 60 mL.  Flush the tube at all of these times: ? Before you give medicine. ? Between medicines. ? After the person gets the last medicine before a feeding.  Do not mix medicines with formula. Do not mix  medicines with other medicines.  Completely flush medicines through the tube. That way, they will not mix with formula. Contact a doctor if:  The tube gets blocked or clogged.  You find any of these on the skin around the tube site: ? Red skin. ? A rash. ? Swelling. ? Leaking fluid. ? Extra skin. Summary  A feeding tube is a tube used to give medicine, water, and liquid food. A person may have this tube if she or he has trouble swallowing or cannot take food or medicine.  Follow the doctor's instructions to care for the tube site and flush the tube every day.  Contact a doctor if the tube gets blocked or clogged. This information is not intended to replace advice given to you by your health care provider. Make sure you discuss any questions you have with your health care provider. Document Revised: 02/23/2017 Document Reviewed: 04/21/2016 Elsevier Patient Education  [place].     Moderate Conscious Sedation, Adult, Care After These instructions provide you with information about caring for yourself after your procedure. Your health care provider may also give you more specific instructions. Your treatment has been planned according to current medical practices, but problems sometimes occur. Call your health care provider if you have any problems or questions after your procedure. What can I expect after the procedure? After your procedure, it is common:  To feel sleepy for several hours.  To feel clumsy and have poor balance for several hours.  To have poor judgment for several hours.  To vomit if you eat too soon. Follow these instructions at home: For at least 24 hours after the procedure:   Do not: ? Participate in activities where you could fall or become injured. ? Drive. ? Use heavy machinery. ? Drink alcohol. ? Take sleeping pills or medicines that cause drowsiness. ? Make important decisions or sign legal documents. ? Take care of children on your  own.  Rest. Eating and drinking  Follow the diet recommended by your health care provider.  If you vomit: ? Drink water, juice, or soup when you can drink without vomiting. ? Make sure you have little or no nausea before eating solid foods. General instructions  Have a responsible adult stay with you until you are awake and alert.  Take over-the-counter and prescription medicines only as told by your health care provider.  If you smoke, do not smoke without supervision.  Keep all follow-up visits as  told by your health care provider. This is important. Contact a health care provider if:  You keep feeling nauseous or you keep vomiting.  You feel light-headed.  You develop a rash.  You have a fever. Get help right away if:  You have trouble breathing. This information is not intended to replace advice given to you by your health care provider. Make sure you discuss any questions you have with your health care provider. Document Revised: 02/23/2017 Document Reviewed: 07/03/2015 Elsevier Patient Education  2020 [employer].

## 2020-02-10 NOTE — H&P (Signed)
Chief Complaint: Cancer of the supraglottics with dysphagia. Request is for gastrostomy tube placement.    Referring Physician(s): Eppie Gibson  Supervising Physician: Sandi Mariscal  Patient Status: [facility] - Out-pt  History of Present Illness: Anita Pollard is a 79 y.o. 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 Patient is currently experiencing dysphagia with concern for progression with treatment. Team is requesting gastrostomy tube placement for nutritional access.   Patient endorses difficulty swallowing liquids and is on thickened liquids at this time.   Past Medical History:  Diagnosis Date  . Alcohol abuse   . Anemia   . Arthritis   . Breast cancer ([place]) 2010   Right  . Chronic kidney disease   . Colon polyp   . Depression   . Diabetes mellitus   . GERD (gastroesophageal reflux disease)   . Heart murmur   . History of kidney stones 1976  . HTN (hypertension)   . Hypothyroidism   . Osteopenia   . Pancreatitis   . Ulcerative esophagitis     Past Surgical History:  Procedure Laterality Date  . ABDOMINAL HYSTERECTOMY    . ABDOMINOPLASTY    . APPENDECTOMY    . BREAST BIOPSY Right 08/03/2008   Stereo Bx, Malignant  . BREAST LUMPECTOMY Right 08/26/2008  . CATARACT EXTRACTION W/ INTRAOCULAR LENS  IMPLANT, BILATERAL    . CO2 LASER APPLICATION N/A 40/98/1191   Procedure: CO2 LASER APPLICATION;  Surgeon: Melida Quitter, MD;  Location: [place];  Service: ENT;  Laterality: N/A;  . COSMETIC SURGERY    . ductal carcinoma     right breast   . EYE SURGERY    . HIP ARTHROPLASTY Right   . INSERTION OF DIALYSIS CATHETER Right 09/25/2012   Procedure: INSERTION OF DIALYSIS CATHETER;  Surgeon: Mal Misty, MD;  Location: [place];  Service: Vascular;  Laterality: Right;  Righ Internal Jugular Placement  . JOINT REPLACEMENT    . 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 MICROLARYNGOSCOPY N/A 10/20/2019   Procedure: Suspended Microlaryngoscopy with Biopsy;  Surgeon: Melida Quitter, MD;  Location: [facility] OR;  Service: ENT;  Laterality: N/A;  . MICROLARYNGOSCOPY N/A 01/09/2020   Procedure: MICROLARYNGOSCOPY WITH BIOPSY;  Surgeon: Melida Quitter, MD;  Location: [place];  Service: ENT;  Laterality: N/A;  . RIGID ESOPHAGOSCOPY N/A 10/20/2019   Procedure: RIGID ESOPHAGOSCOPY;  Surgeon: Melida Quitter, MD;  Location: [place];  Service: ENT;  Laterality: N/A;  . THYROIDECTOMY    . TUMMY TUCK      Allergies: Penicillins, Banana, Detrol [tolterodine], Quetiapine, Risperidone and related, and Lisinopril  Medications: Prior to Admission medications   Medication Sig Start Date End Date Taking? Authorizing Provider  amLODipine (NORVASC) 10 MG tablet Take 10 mg by mouth at bedtime.   Yes [provider]  Cholecalciferol (VITAMIN D3) 5000 UNITS TABS Take 5,000 Units by mouth 3 (three) times daily after meals.    Yes [provider]  glipiZIDE (GLUCOTROL XL) 2.5 MG 24 hr tablet Take 5 mg by mouth 2 (two) times daily.  01/01/14  Yes [provider]  levothyroxine (SYNTHROID) 200 MCG tablet Take 200 mcg by mouth daily before breakfast.    Yes [provider]  losartan (COZAAR) 25 MG tablet Take 25 mg by mouth at bedtime.  07/09/16  Yes [provider]  magnesium oxide (MAGOX 400) 400 (  241.3 MG) MG tablet Take 400 mg by mouth 3 (three) times daily.  09/08/14  Yes [provider]  Melatonin 5 MG CAPS Take 5 mg by mouth See admin instructions. Take 5 mg 2 hours before bedtime   Yes [provider]  Misc Natural Products (GLUCOS-CHONDROIT-MSM COMPLEX PO) Take 1 tablet by mouth 3 (three) times daily.   Yes [provider]  Multiple Vitamin (MULTIVITAMIN) tablet Take 1 tablet by mouth daily.   Yes [provider]  mycophenolate (MYFORTIC) 180 MG EC tablet Take 180 mg by mouth every 12  (twelve) hours.  05/25/14  Yes [provider]  omeprazole (PRILOSEC) 20 MG capsule Take 20 mg by mouth daily.   Yes [provider]  ondansetron (ZOFRAN) 4 MG tablet Take 4 mg by mouth every 8 (eight) hours as needed for nausea or vomiting.  06/09/19  Yes [provider]  Pancrelipase, Lip-Prot-Amyl, (CREON) 3000-9500 units CPEP Take 1 capsule by mouth 3 (three) times daily with meals.    Yes [provider]  Polyethyl Glycol-Propyl Glycol (SYSTANE OP) Place 1 drop into both eyes every evening.   Yes [provider]  potassium chloride (MICRO-K) 10 MEQ CR capsule Take 10-20 mEq by mouth See admin instructions. Take 20 meq by mouth daily on Sunday, Tuesday, Thursday and Saturday, take 10 meq on Monday, Wednesday and Friday 01/21/16  Yes [provider]  pramipexole (MIRAPEX) 0.25 MG tablet Take 0.25-0.5 mg by mouth See admin instructions. Take 0.5 mg 2 hours prior to bedtime then 0.25 mg at bedtime   Yes [provider]  predniSONE (DELTASONE) 10 MG tablet Take 10 mg by mouth daily with breakfast.    Yes [provider]  Probiotic Product (CULTURELLE PROBIOTICS PO) Take 1 capsule by mouth at bedtime.   Yes [provider]  raloxifene (EVISTA) 60 MG tablet Take 60 mg by mouth every evening.    Yes [provider]  rosuvastatin (CRESTOR) 5 MG tablet Take 1 tablet (5 mg total) by mouth 2 (two) times a week. Patient taking differently: Take 5 mg by mouth 2 (two) times a week. Mondays and Fridays 05/05/19  Yes Dorothy Spark, MD  sulfamethoxazole-trimethoprim Octavio Graves) 400-80 MG tablet Take 1 tablet by mouth every Monday, Wednesday, and Friday.  12/25/14  Yes [provider]  tacrolimus (PROGRAF) 0.5 MG capsule Take 0.5 mg by mouth every 12 (twelve) hours.    Yes [provider]  tacrolimus (PROGRAF) 1 MG capsule Take 1 mg by mouth every 12 (twelve) hours.  10/02/14  Yes [provider]   temazepam (RESTORIL) 15 MG capsule Take 15-30 mg by mouth at bedtime.  05/20/13  Yes [provider]  Turmeric (QC TUMERIC COMPLEX PO) Take 750 mg by mouth daily as needed (joint pain).    Yes [provider]  vitamin B-12 (CYANOCOBALAMIN) 500 MCG tablet Take 500 mcg by mouth daily.   Yes [provider]  Lancets (FREESTYLE) lancets by Does not apply route.    [provider]     Family History  Problem Relation Age of Onset  . Thyroid cancer Daughter 36  . Diabetes Father   . Hypertension Father   . Diabetes Brother   . Hypertension Mother   . Hypertension Sister   . Colon polyps Brother   . Atrial fibrillation Brother   . Dementia Sister   . Multiple myeloma Sister     Social History   Socioeconomic History  . Marital  status: Widowed    Spouse name: Not on file  . Number of children: Not on file  . Years of education: Not on file  . Highest education level: Not on file  Occupational History  . Not on file  Tobacco Use  . Smoking status: Former Smoker    Quit date: 09/25/1983    Years since quitting: 36.4  . Smokeless tobacco: Never Used  Vaping Use  . Vaping Use: Never used  Substance and Sexual Activity  . 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 Drug use: No  . Sexual activity: Never  Other Topics Concern  . Not on file  Social History Narrative  . Not on file   Social Determinants of Health   Financial Resource Strain: Low Risk   . Difficulty of Paying Living Expenses: Not hard at all  Food Insecurity: No Food Insecurity  . Worried About Charity fundraiser in the Last Year: Never true  . Ran Out of Food in the Last Year: Never true  Transportation Needs: No Transportation Needs  . Lack of Transportation (Medical): No  . Lack of Transportation (Non-Medical): No  Physical Activity: Insufficiently Active  . Days of Exercise per Week: 5  days  . Minutes of Exercise per Session: 20 min  Stress:   . Feeling of Stress : Not on file  Social Connections: Moderately Isolated  . Frequency of Communication with Friends and Family: More than three times a week  . Frequency of Social Gatherings with Friends and Family: Never  . Attends Religious Services: Never  . Active Member of Clubs or Organizations: Yes  . Attends Archivist Meetings: Never  . Marital Status: Widowed     Review of Systems: A 12 point ROS discussed and pertinent positives are indicated in the HPI above.  All other systems are negative.  Review of Systems  Constitutional: Negative for fatigue and fever.  HENT: Positive for trouble swallowing ( Water. On thickened liquids). Negative for congestion.   Respiratory: Negative for cough and shortness of breath.   Gastrointestinal: Negative for abdominal pain, diarrhea, nausea and vomiting.  Musculoskeletal: Positive for myalgias ( left arm pain "locked up").    Vital Signs: BP (!) 145/60   Pulse 71   Temp 98.1 F (36.7 C) (Oral)   Resp 18   SpO2 99%   Physical Exam Vitals and nursing note reviewed.  Constitutional:      Appearance: She is well-developed.  HENT:     Head: Normocephalic and atraumatic.  Eyes:     Conjunctiva/sclera: Conjunctivae normal.  Cardiovascular:     Rate and Rhythm: Normal rate and regular rhythm.     Heart sounds: Normal heart sounds.  Pulmonary:     Effort: Pulmonary effort is normal.     Breath sounds: Normal breath sounds.  Musculoskeletal:     Cervical back: Normal range of motion.  Skin:    General: Skin is warm and dry.  Neurological:     Mental Status: She is alert and oriented to person, place, and time.     Imaging: NM PET Image Initial (PI) Skull Base To Thigh  Result Date: 01/29/2020 CLINICAL DATA:  Initial treatment strategy for head and neck cancer. EXAM: NUCLEAR MEDICINE PET SKULL BASE TO THIGH TECHNIQUE: 6.36 mCi F-18 FDG was injected  intravenously. Full-ring PET imaging was performed from the skull base to thigh  after the radiotracer. CT data was obtained and used for attenuation correction and anatomic localization. Fasting blood glucose: 254 mg/dl COMPARISON:  CT abdomen and pelvis from 2014, CT of the neck from July of 2021 FINDINGS: Mediastinal blood pool activity: SUV max 2.46 Liver activity: SUV max NA NECK: No hypermetabolic lymph nodes in the neck. Mild irregularity of the glottis with maximum SUV of 2.5 just to the LEFT of midline. Glottic thickening not as well seen as on the prior CT. Incidental CT findings: On the prior CT CHEST: Small nodule in the posterior LEFT lower lobe superior segment (image 76, series 3) this is not associated with FDG uptake above cardiac blood pool. Approximately 4 mm. Tiny pulmonary nodule along the superior aspect of the major fissure in the LEFT chest also not associated with increased metabolic activity approximately 3 mm. Granuloma in the RIGHT upper lobe. No consolidation. No pleural effusion. No signs of hypermetabolic lymph nodes in the neck, lower neck or chest. Incidental CT findings: Calcified atheromatous plaque in the thoracic aorta. Calcified coronary artery disease of LEFT coronary circulation. Heart size is normal without pericardial effusion. Evidence of postoperative changes in the low neck related to thyroidectomy. No nodal enlargement by size criteria in the chest. Esophagus grossly normal. Pulmonary findings as above. ABDOMEN/PELVIS: No abnormal hypermetabolic activity within the liver, pancreas, adrenal glands, or spleen. No hypermetabolic lymph nodes in the abdomen or pelvis. Incidental CT findings: Noncontrast appearance of liver and biliary tree is unremarkable. Spleen is normal size. Signs of chronic calcific pancreatitis with calcifications is suggest intraductal calculi, no adjacent stranding. Portosystemic collaterals about the short gastrics imply prior splenic venous  compromise, a chronic finding. Adrenal glands are normal. Large LEFT-sided renal pelvic calculus within a markedly atrophied LEFT kidney measures 11 x 7 mm. An additional 7 mm intrarenal calculus is noted. Mild peripelvic stranding. No dilation of the collecting systems of the markedly atrophic LEFT kidney. Renal vascular calcifications bilaterally. Urinary bladder distended with urine. RIGHT lower quadrant with 2 iliac fossa transplant kidneys displaying no sign of hydronephrosis. Colonic diverticulosis. No sign of acute gastrointestinal process. SKELETON: No focal hypermetabolic activity to suggest skeletal metastasis. Incidental CT findings: Osteopenia and spinal degenerative changes. RIGHT hip arthroplasty. Healed fractures of RIGHT inferior pubic ramus. Degenerative changes with potential AVN of LEFT femoral head. IMPRESSION: 1. No signs of definitive increased FDG uptake. Mild asymmetry of uptake about the LEFT aspect of the glottis. Glottic thickening not seen to the extent that it was seen on the previous CT of the neck. 2. Tiny pulmonary nodules without FDG uptake above mediastinal blood pool, nonspecific. Attention on follow-up. 3. LEFT renal pelvic calculus at the UPJ involving the native kidney without signs of hydronephrosis in this patient with RIGHT lower quadrant transplant kidneys that show no evidence of hydronephrosis or perinephric fluid. 4. Changes of chronic calcific pancreatitis, aortic atherosclerosis and coronary artery disease. 5. Colonic diverticulosis. Aortic Atherosclerosis (ICD10-I70.0). Electronically Signed   By: Zetta Bills M.D.   On: 01/29/2020 16:47    Labs:  CBC: Recent Labs    01/07/20 1105 02/03/20 1245 02/10/20 1020  WBC 7.6 7.4 8.4  HGB 11.8* 11.4* 12.1  HCT 37.7 34.5* 37.6  PLT 183 174 207    COAGS: No results for input(s): INR, APTT in the last 8760 hours.  BMP: Recent Labs    01/07/20 1105 01/30/20 1442 02/03/20 1245  NA 137 136 137  K 4.7 3.0*  3.4*  CL 101 110 107  CO2  25 17* 23  GLUCOSE 258* 316* 283*  BUN 28* 31* 36*  CALCIUM 9.6 8.1* 8.3*  CREATININE 1.10* 1.15* 1.34*  GFRNONAA 47* 48* 40*    LIVER FUNCTION TESTS: Recent Labs    02/03/20 1245  BILITOT 0.5  AST 12*  ALT 12  ALKPHOS 55  PROT 6.4*  ALBUMIN 4.0    Assessment and Plan:  78 y.o. female outpatient. 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 Patient is currently experiencing dysphagia with concern for progression with treatment. Team is requesting gastrostomy tube placement for nutritional access.   PET from 11.4.21. All labs and medications are within acceptable parameters. Patient has been NPO since midnight. COVID - on 11.13.21.  Risks and benefits image guided gastrostomy tube placement was discussed with the patient including, but not limited to the need for a barium enema during the procedure, bleeding, infection, peritonitis and/or damage to adjacent structures.  All of the patient's questions were answered, patient is agreeable to proceed.  Consent signed and in chart.   Thank you for this interesting consult.  I greatly enjoyed meeting Anita Pollard and look forward to participating in their care.  A copy of this report was sent to the requesting provider on this date.  Electronically Signed: Jacqualine Mau, NP 02/10/2020, 10:33 AM   I spent a total of  30 Minutes   in face to face in clinical consultation, greater than 50% of which was counseling/coordinating care for gastrostomy tube placement

## 2020-02-11 ENCOUNTER — Ambulatory Visit
Admission: RE | Admit: 2020-02-11 | Discharge: 2020-02-11 | Disposition: A | Discharge status: Home / Self Care | Payer: Medicare Other | Source: Ambulatory Visit | Attending: Radiation Oncology | Admitting: Radiation Oncology

## 2020-02-11 ENCOUNTER — Inpatient Hospital Stay: Payer: Medicare Other | Admitting: Nutrition

## 2020-02-11 DIAGNOSIS — Z510 Encounter for antineoplastic radiation therapy: Secondary | ICD-10-CM | POA: Diagnosis not present

## 2020-02-11 NOTE — Progress Notes (Signed)
Symptoms Management Clinic Progress Note   Anita Pollard [id number] May 15, 1940 79 y.o.  Anita Pollard is managed by Dr. Eppie Gibson  Actively treated with chemotherapy/immunotherapy/hormonal therapy: Anita Pollard is pending start of radiation therapy next week   Assessment: Plan:    Dehydration - Plan: 0.9 % NaCl with KCl 20 mEq/ L  infusion  Hypokalemia - Plan: 0.9 % NaCl with KCl 20 mEq/ L  infusion  Orthostatic hypotension  Malignant neoplasm of supraglottis (HCC)   Dehydration orthostatic hypotension: The patient was given 1 L of normal saline over 2 hours.  Hypokalemia: A chemistry panel returned today with a potassium of 3.4.  Anita Pollard was given potassium chloride 20 mEq IV x1 today.  Diabetes: She is following up with her endocrinologist in [facility] tomorrow and by her report may potentially be placed on insulin.  Malignant neoplasm of the supraglottis: Anita Pollard is scheduled to begin radiation therapy next week.  She is having a PEG tube placed on next Tuesday.  She is being seen by nutrition today.  She will be contacted next week to schedule a follow-up appointment in the symptom management clinic.  Please see After Visit Summary for patient specific instructions.  Future Appointments  Date Time Provider [place]  02/12/2020 10:45 AM [facility] LINAC 3 [facility] None  02/12/2020 11:00 AM Breedlove Blue, Blaire L, PT [facility] None  02/12/2020 11:45 AM Sharen Counter, CCC-SLP [facility]  02/13/2020 11:15 AM [facility] LINAC 3 [facility] None  02/15/2020 10:30 AM [facility] LINAC 3 [facility] None  02/16/2020 10:45 AM [facility] LINAC 3 [facility] None  02/17/2020 10:45 AM [facility] LINAC 3 [facility] None  02/17/2020  3:15 PM Zenia Resides, Joli, RD [facility] None  02/18/2020 10:45 AM [facility] LINAC 3 [facility] None  02/20/2020  1:00 PM [facility] SPEECH THERAPIST [facility] [place]  02/20/2020  1:00 PM [facility]-DG R/F 2 [facility]DG [place]  LONG  02/23/2020 10:45 AM [facility] LINAC 3 [facility] None  02/24/2020 10:45 AM [facility] LINAC 3 [facility] None  02/25/2020 10:45 AM [facility] LINAC 3 [facility] None  02/26/2020 10:45 AM [facility] LINAC 3 [facility] None  02/26/2020 11:15 AM Neff, Barbara L, RD [facility] None  02/27/2020 10:45 AM [facility] LINAC 3 [facility] None  03/01/2020 10:45 AM [facility] LINAC 3 [facility] None  03/02/2020 10:45 AM [facility] LINAC 3 [facility] None  03/03/2020 10:45 AM [facility] LINAC 3 [facility] None  03/03/2020 11:15 AM Neff, Barbara L, RD [facility] None  03/04/2020 10:45 AM [facility] LINAC 3 [facility] None  03/05/2020 10:45 AM [facility] LINAC 3 [facility] None  03/08/2020 10:45 AM [facility] LINAC 3 [facility] None  03/08/2020 12:00 PM Neff, Barbara L, RD [facility] None  03/09/2020 10:45 AM [facility] LINAC 3 [facility] None  03/10/2020 10:45 AM [facility] LINAC 3 [facility] None  03/11/2020 10:45 AM [facility] LINAC 3 [facility] None  03/12/2020 10:45 AM [facility] LINAC 3 [facility] None  03/15/2020 10:45 AM [facility] LINAC 3 [facility] None  03/16/2020 10:45 AM [facility] LINAC 3 [facility] None  03/17/2020 10:45 AM [facility] LINAC 3 [facility] None  03/17/2020 11:15 AM Neff, Barbara L, RD [facility] None  03/18/2020 10:45 AM [facility] LINAC 3 [facility] None  03/22/2020 10:45 AM [facility] LINAC 3 [facility] None  03/23/2020 10:45 AM [facility] LINAC 3 [facility] None  03/24/2020 10:45 AM [facility] LINAC 3 [facility] None  03/24/2020 11:15 AM Neff, Barbara L, RD [facility] None  03/25/2020 10:45 AM [facility] LINAC 3 [facility] None  03/29/2020 10:45 AM [facility] LINAC 3 [facility] None  03/30/2020 10:45 AM [facility] LINAC 3  [facility] None  03/30/2020 11:15 AM Jennet Maduro, RD [facility] None  04/01/2020 10:45 AM [facility] LINAC 3 [facility] None    No orders of the defined types were placed in this encounter.       Subjective:   Patient ID:  Anita Pollard is a 79 y.o. (DOB 09-03-1940) female.  Chief Complaint: No chief complaint on file.   HPI Anita Pollard  is a 79 y.o. female with a diagnosis of a malignant neoplasm of the supraglottis.  She is being followed by Dr. Eppie Gibson and is to begin radiation therapy next week.  Dr. Isidore Moos asked that I see the patient today and follow her closely during her treatment.  She is scheduled to have a PEG tube placed next Tuesday.  She is being seen by our nutritionist today.  She is being seen by her endocrinologist tomorrow for her poorly controlled diabetes.  She reports that she potentially will be started on insulin tomorrow.  She is having difficulty swallowing and is using Thick-It in all of her liquids.  She reports that she is mainly drinking water.  She reports that she used has been avoiding carbohydrates per her endocrinologist recommendations and has been sustaining herself on cheese and protein.  Her daughter lives in this area and her son lives in [place].  Her son and her daughter-in-law have refused to get a Covid vaccination.  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  She was told by her transplant doctor that she should avoid anyone that has not had a Covid vaccination thus she does not see her son.  This causes her a great deal of angst.   Medications: I have reviewed the patient's current medications.  Allergies:  Allergies  Allergen Reactions  . Penicillins Itching and Swelling    Swelling and flushing Has patient had a PCN reaction causing immediate rash, facial/tongue/throat swelling, SOB or lightheadedness with hypotension: yes Has patient had a PCN reaction causing severe rash involving mucus membranes or skin necrosis: no Has patient had a PCN reaction that required hospitalization: no Has patient had a PCN reaction occurring within the last 10 years: no If all of the above answers are "NO", then  may proceed with Cephalosporin use.  . Banana Nausea Only    Green bananas   . Detrol [Tolterodine]     Leg cramps  . Quetiapine Other (See Comments)    Leg cramps/restless leg  . Risperidone And Related     Unknown reaction  . Lisinopril Cough    Past Medical History:  Diagnosis Date  . Alcohol abuse   . Anemia   . Arthritis   . Breast cancer ([place]) 2010   Right  . Chronic kidney disease   . Colon polyp   . Depression   . Diabetes mellitus   . GERD (gastroesophageal reflux disease)   . Heart murmur   . History of kidney stones 1976  . HTN (hypertension)   . Hypothyroidism   . Osteopenia   . Pancreatitis   . Ulcerative esophagitis     Past Surgical History:  Procedure Laterality Date  . ABDOMINAL HYSTERECTOMY    . ABDOMINOPLASTY    . APPENDECTOMY    . BREAST BIOPSY Right 08/03/2008   Stereo Bx, Malignant  . BREAST LUMPECTOMY Right 08/26/2008  . CATARACT EXTRACTION W/ INTRAOCULAR LENS  IMPLANT, BILATERAL    . CO2 LASER APPLICATION N/A 88/28/0034   Procedure: CO2 LASER APPLICATION;  Surgeon:  Melida Quitter, MD;  Location: [facility];  Service: ENT;  Laterality: N/A;  . COSMETIC SURGERY    . ductal carcinoma     right breast   . EYE SURGERY    . HIP ARTHROPLASTY Right   . INSERTION OF DIALYSIS CATHETER Right 09/25/2012   Procedure: INSERTION OF DIALYSIS CATHETER;  Surgeon: Mal Misty, MD;  Location: [facility];  Service: Vascular;  Laterality: Right;  Righ Internal Jugular Placement  . IR GASTROSTOMY TUBE MOD SED  02/10/2020  . JOINT REPLACEMENT    . 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 MICROLARYNGOSCOPY N/A 10/20/2019   Procedure: Suspended Microlaryngoscopy with Biopsy;  Surgeon: Melida Quitter, MD;  [facility];  Service: ENT;  Laterality: N/A;  . MICROLARYNGOSCOPY N/A 01/09/2020   Procedure: MICROLARYNGOSCOPY WITH BIOPSY;  Surgeon: Melida Quitter, MD;  Location: [facility];  Service: ENT;  Laterality: N/A;  . RIGID ESOPHAGOSCOPY N/A 10/20/2019   Procedure: RIGID  ESOPHAGOSCOPY;  Surgeon: Melida Quitter, MD;  Location: [facility];  Service: ENT;  Laterality: N/A;  . THYROIDECTOMY    . TUMMY TUCK      Family History  Problem Relation Age of Onset  . Thyroid cancer Daughter 57  . Diabetes Father   . Hypertension Father   . Diabetes Brother   . Hypertension Mother   . Hypertension Sister   . Colon polyps Brother   . Atrial fibrillation Brother   . Dementia Sister   . Multiple myeloma Sister     Social History   Socioeconomic History  . Marital status: Widowed    Spouse name: Not on file  . Number of children: Not on file  . Years of education: Not on file  . Highest education level: Not on file  Occupational History  . Not on file  Tobacco Use  . Smoking status: Former Smoker    Quit date: 09/25/1983    Years since quitting: 36.4  . Smokeless tobacco: Never Used  Vaping Use  . Vaping Use: Never used  Substance and Sexual Activity  . 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 Drug use: No  . Sexual activity: Never  Other Topics Concern  . Not on file  Social History Narrative  . Not on file   Social Determinants of Health   Financial Resource Strain: Low Risk   . Difficulty of Paying Living Expenses: Not hard at all  Food Insecurity: No Food Insecurity  . Worried About Charity fundraiser in the Last Year: Never true  . Ran Out of Food in the Last Year: Never true  Transportation Needs: No Transportation Needs  . Lack of Transportation (Medical): No  . Lack of Transportation (Non-Medical): No  Physical Activity: Insufficiently Active  . Days of Exercise per Week: 5 days  . Minutes of Exercise per Session: 20 min  Stress:   . Feeling of Stress : Not on file  Social Connections: Moderately Isolated  . Frequency of Communication with Friends and Family: More than three times a week  . Frequency of Social Gatherings with Friends and Family: Never    . Attends Religious Services: Never  . Active Member of Clubs or Organizations: Yes  . Attends Archivist Meetings: Never  . Marital Status: Widowed  Intimate Partner Violence:   . Fear of Current or Ex-Partner: Not on file  . Emotionally Abused: Not on file  .  Physically Abused: Not on file  . Sexually Abused: Not on file    Past Medical History, Surgical history, Social history, and Family history were reviewed and updated as appropriate.   Please see review of systems for further details on the patient's review from today.   Review of Systems:  Review of Systems  Constitutional: Negative for chills, diaphoresis and fever.  HENT: Positive for trouble swallowing. Negative for voice change.   Respiratory: Negative for cough, chest tightness, shortness of breath and wheezing.   Cardiovascular: Negative for chest pain and palpitations.  Gastrointestinal: Negative for abdominal pain, constipation, diarrhea, nausea and vomiting.  Musculoskeletal: Negative for Pollard pain and myalgias.  Neurological: Positive for dizziness. Negative for light-headedness and headaches.  Psychiatric/Behavioral: Positive for dysphoric mood.    Objective:   Physical Exam:  BP (!) 152/63   Pulse 65   Temp 98.4 F (36.9 C) (Oral)   Resp 16   Ht '5\' 1"'  (1.549 m)   Wt 126 lb 8 oz (57.4 kg)   SpO2 100%   BMI 23.90 kg/m  ECOG: 1  Orthostatic Blood Pressure: Blood pressure:   sitting 143/61, standing 123/59 Pulse:   sitting 66, standing 74    Physical Exam Constitutional:      General: She is not in acute distress.    Appearance: She is not diaphoretic.  HENT:     Head: Normocephalic and atraumatic.     Right Ear: External ear normal.     Left Ear: External ear normal.     Mouth/Throat:     Pharynx: No oropharyngeal exudate.  Eyes:     General: No scleral icterus.       Right eye: No discharge.        Left eye: No discharge.     Conjunctiva/sclera: Conjunctivae normal.   Cardiovascular:     Rate and Rhythm: Normal rate and regular rhythm.     Heart sounds: Normal heart sounds. No murmur heard.  No friction rub. No gallop.   Pulmonary:     Effort: Pulmonary effort is normal. No respiratory distress.     Breath sounds: Normal breath sounds. No wheezing or rales.  Musculoskeletal:     Cervical Pollard: Normal range of motion and neck supple.  Lymphadenopathy:     Cervical: No cervical adenopathy.  Skin:    General: Skin is warm and dry.     Findings: No erythema or rash.  Neurological:     Mental Status: She is alert.     Coordination: Coordination normal.  Psychiatric:        Behavior: Behavior normal.        Thought Content: Thought content normal.        Judgment: Judgment normal.     Comments: The patient is intermittently tearful     Lab Review:     Component Value Date/Time   NA 137 02/03/2020 1245   NA 141 05/01/2018 0749   NA 140 08/28/2013 0955   K 3.4 (L) 02/03/2020 1245   K 3.0 (LL) 08/28/2013 0955   CL 107 02/03/2020 1245   CL 111 (H) 09/03/2012 1249   CO2 23 02/03/2020 1245   CO2 16 (L) 08/28/2013 0955   GLUCOSE 283 (H) 02/03/2020 1245   GLUCOSE 110 08/28/2013 0955   GLUCOSE 214 (H) 09/03/2012 1249   BUN 36 (H) 02/03/2020 1245   BUN 25 05/01/2018 0749   BUN 75.7 (H) 08/28/2013 0955   CREATININE 1.34 (H) 02/03/2020 1245   CREATININE  5.9 (HH) 08/28/2013 0955   CALCIUM 8.3 (L) 02/03/2020 1245   CALCIUM 7.5 (L) 08/28/2013 0955   PROT 6.4 (L) 02/03/2020 1245   PROT 6.5 05/01/2018 0749   PROT 5.7 (L) 08/28/2013 0955   ALBUMIN 4.0 02/03/2020 1245   ALBUMIN 4.5 05/01/2018 0749   ALBUMIN 2.6 (L) 08/28/2013 0955   AST 12 (L) 02/03/2020 1245   AST 15 08/28/2013 0955   ALT 12 02/03/2020 1245   ALT 17 08/28/2013 0955   ALKPHOS 55 02/03/2020 1245   ALKPHOS 62 08/28/2013 0955   BILITOT 0.5 02/03/2020 1245   BILITOT 0.38 08/28/2013 0955   GFRNONAA 40 (L) 02/03/2020 1245   GFRAA 56 (L) 05/01/2018 0749       Component Value  Date/Time   WBC 8.4 02/10/2020 1020   RBC 3.82 (L) 02/10/2020 1020   HGB 12.1 02/10/2020 1020   HGB 11.4 (L) 02/03/2020 1245   HGB 9.7 (L) 08/28/2013 0955   HCT 37.6 02/10/2020 1020   HCT 29.6 (L) 08/28/2013 0955   PLT 207 02/10/2020 1020   PLT 174 02/03/2020 1245   PLT 207 08/28/2013 0955   MCV 98.4 02/10/2020 1020   MCV 100.7 08/28/2013 0955   MCH 31.7 02/10/2020 1020   MCHC 32.2 02/10/2020 1020   RDW 13.6 02/10/2020 1020   RDW 12.4 08/28/2013 0955   LYMPHSABS 0.9 02/03/2020 1245   LYMPHSABS 1.1 08/28/2013 0955   MONOABS 0.2 02/03/2020 1245   MONOABS 0.6 08/28/2013 0955   EOSABS 0.0 02/03/2020 1245   EOSABS 0.5 08/28/2013 0955   BASOSABS 0.0 02/03/2020 1245   BASOSABS 0.1 08/28/2013 0955   -------------------------------  Imaging from last 24 hours (if applicable):  Radiology interpretation: IR Gastrostomy Tube  Result Date: 02/10/2020 INDICATION: History of head neck cancer, now with dysphagia. Please perform percutaneous gastrostomy tube placement prior to further radiation. EXAM: PULL TROUGH GASTROSTOMY TUBE PLACEMENT COMPARISON:  None. MEDICATIONS: Vancomycin 1 gm IV; Antibiotics were administered within 1 hour of the procedure. Glucagon 1 mg IV CONTRAST:  20 cc of Omnipaque 300 administered into the gastric lumen. ANESTHESIA/SEDATION: Moderate (conscious) sedation was employed during this procedure. A total of Versed 2 mg and Fentanyl 100 mcg was administered intravenously. Moderate Sedation Time: 11 minutes. The patient's level of consciousness and vital signs were monitored continuously by radiology nursing throughout the procedure under my direct supervision. FLUOROSCOPY TIME:  2 minutes, 24 seconds (23 mGy) COMPLICATIONS: None immediate. PROCEDURE: Informed written consent was obtained from the patient following explanation of the procedure, risks, benefits and alternatives. A time out was performed prior to the initiation of the procedure. Ultrasound scanning was  performed to demarcate the edge of the left lobe of the liver. Maximal barrier sterile technique utilized including caps, mask, sterile gowns, sterile gloves, large sterile drape, hand hygiene and Betadine prep. The left upper quadrant was sterilely prepped and draped. An oral gastric catheter was inserted into the stomach under fluoroscopy. The existing nasogastric feeding tube was removed. The left costal margin and air opacified transverse colon were identified and avoided. Air was injected into the stomach for insufflation and visualization under fluoroscopy. Under sterile conditions a 17 gauge trocar needle was utilized to access the stomach percutaneously beneath the left subcostal margin after the overlying soft tissues were anesthetized with 1% Lidocaine with epinephrine. Needle position was confirmed within the stomach with aspiration of air and injection of small amount of contrast. A single T tack was deployed for gastropexy. Over an Amplatz guide wire, a 9-French  sheath was inserted into the stomach. A snare device was utilized to capture the oral gastric catheter. The snare device was pulled retrograde from the stomach up the esophagus and out the oropharynx. The 20-French pull-through gastrostomy was connected to the snare device and pulled antegrade through the oropharynx down the esophagus into the stomach and then through the percutaneous tract external to the patient. The gastrostomy was assembled externally. Contrast injection confirms position in the stomach. Several spot radiographic images were obtained in various obliquities for documentation. The patient tolerated procedure well without immediate post procedural complication. FINDINGS: After successful fluoroscopic guided placement, the gastrostomy tube is appropriately positioned with internal disc against the ventral aspect of the gastric lumen. IMPRESSION: Successful fluoroscopic insertion of a 20-French pull-through gastrostomy tube. The  gastrostomy may be used immediately for medication administration and in 24 hrs for the initiation of feeds. Electronically Signed   By: Sandi Mariscal M.D.   On: 02/10/2020 12:43   NM PET Image Initial (PI) Skull Base To Thigh  Result Date: 01/29/2020 CLINICAL DATA:  Initial treatment strategy for head and neck cancer. EXAM: NUCLEAR MEDICINE PET SKULL BASE TO THIGH TECHNIQUE: 6.36 mCi F-18 FDG was injected intravenously. Full-ring PET imaging was performed from the skull base to thigh after the radiotracer. CT data was obtained and used for attenuation correction and anatomic localization. Fasting blood glucose: 254 mg/dl COMPARISON:  CT abdomen and pelvis from 2014, CT of the neck from July of 2021 FINDINGS: Mediastinal blood pool activity: SUV max 2.46 Liver activity: SUV max NA NECK: No hypermetabolic lymph nodes in the neck. Mild irregularity of the glottis with maximum SUV of 2.5 just to the LEFT of midline. Glottic thickening not as well seen as on the prior CT. Incidental CT findings: On the prior CT CHEST: Small nodule in the posterior LEFT lower lobe superior segment (image 76, series 3) this is not associated with FDG uptake above cardiac blood pool. Approximately 4 mm. Tiny pulmonary nodule along the superior aspect of the major fissure in the LEFT chest also not associated with increased metabolic activity approximately 3 mm. Granuloma in the RIGHT upper lobe. No consolidation. No pleural effusion. No signs of hypermetabolic lymph nodes in the neck, lower neck or chest. Incidental CT findings: Calcified atheromatous plaque in the thoracic aorta. Calcified coronary artery disease of LEFT coronary circulation. Heart size is normal without pericardial effusion. Evidence of postoperative changes in the low neck related to thyroidectomy. No nodal enlargement by size criteria in the chest. Esophagus grossly normal. Pulmonary findings as above. ABDOMEN/PELVIS: No abnormal hypermetabolic activity within the  liver, pancreas, adrenal glands, or spleen. No hypermetabolic lymph nodes in the abdomen or pelvis. Incidental CT findings: Noncontrast appearance of liver and biliary tree is unremarkable. Spleen is normal size. Signs of chronic calcific pancreatitis with calcifications is suggest intraductal calculi, no adjacent stranding. Portosystemic collaterals about the short gastrics imply prior splenic venous compromise, a chronic finding. Adrenal glands are normal. Large LEFT-sided renal pelvic calculus within a markedly atrophied LEFT kidney measures 11 x 7 mm. An additional 7 mm intrarenal calculus is noted. Mild peripelvic stranding. No dilation of the collecting systems of the markedly atrophic LEFT kidney. Renal vascular calcifications bilaterally. Urinary bladder distended with urine. RIGHT lower quadrant with 2 iliac fossa transplant kidneys displaying no sign of hydronephrosis. Colonic diverticulosis. No sign of acute gastrointestinal process. SKELETON: No focal hypermetabolic activity to suggest skeletal metastasis. Incidental CT findings: Osteopenia and spinal degenerative changes. RIGHT hip arthroplasty. Healed  fractures of RIGHT inferior pubic ramus. Degenerative changes with potential AVN of LEFT femoral head. IMPRESSION: 1. No signs of definitive increased FDG uptake. Mild asymmetry of uptake about the LEFT aspect of the glottis. Glottic thickening not seen to the extent that it was seen on the previous CT of the neck. 2. Tiny pulmonary nodules without FDG uptake above mediastinal blood pool, nonspecific. Attention on follow-up. 3. LEFT renal pelvic calculus at the UPJ involving the native kidney without signs of hydronephrosis in this patient with RIGHT lower quadrant transplant kidneys that show no evidence of hydronephrosis or perinephric fluid. 4. Changes of chronic calcific pancreatitis, aortic atherosclerosis and coronary artery disease. 5. Colonic diverticulosis. Aortic Atherosclerosis (ICD10-I70.0).  Electronically Signed   By: Zetta Bills M.D.   On: 01/29/2020 16:47

## 2020-02-11 NOTE — Progress Notes (Signed)
Nutrition follow-up completed with patient diagnosed with supraglottis cancer.   Patient is status post G-tube placement on November 16. She reports she is very sore after placement.   She states she is afraid to change the dressing or flush the feeding tube even though she has been educated. Reports she was told not to have anything to eat for 24 hours so she has not had breakfast this morning.   She continues to drink water which has been thickened. Weight documented as 126.5 pounds on November 9.  Estimated nutrition needs: 1700-1900 cal, 85-95 g protein, 1.7 L fluid.  Nutrition diagnosis: Predicted suboptimal energy intake continues.  Intervention: Both oncology navigator RN and I reviewed G-tube site care and flushing with patient today and she successfully demonstrated appropriate skills. Educated her to use 1 carton Osmolite 1.5 with 60 mL of water before and after bolus feeding once a day.  She will give this over 15 minutes. Education given on crushing medications and infusing via feeding tube one at a time.  She has a list of medications that can be crushed which was given to her by her nephrologist. Provided reassurance and encouraged patient to contact me with questions regarding tube feeding.  Monitoring, evaluation, goals: We will monitor oral intake, tube feeding tolerance and weights throughout treatment.  Next visit: To be scheduled weekly with treatment.  **Disclaimer: This note was dictated with voice recognition software. Similar sounding words can inadvertently be transcribed and this note may contain transcription errors which may not have been corrected upon publication of note.**

## 2020-02-11 NOTE — Progress Notes (Signed)
Oncology Nurse Navigator Documentation  I was asked by Dory Peru RD to join her during her nutrition appointment with Ms. Kinser today. I reinforced my previous education regarding changing the dressing to her newly placed feeding tube. I demonstrated cleaning of the site and removing and replacing the dressing. She was able to demonstrate placing the dressing underneath the retention ring today. Dory Peru RD reinforced previous education regarding daily flushes and osmolite administration via the feeding tube. Ms. Lalli was provided with a calendar of upcoming appointments and I accompanied her to her radiation treatment.   Harlow Asa RN, BSN, OCN Head & Neck Oncology Nurse [place] [facility] Phone # [phone]  Fax # [phone]

## 2020-02-12 ENCOUNTER — Ambulatory Visit: Payer: Medicare Other

## 2020-02-12 ENCOUNTER — Other Ambulatory Visit: Payer: Self-pay

## 2020-02-12 ENCOUNTER — Encounter: Payer: Self-pay | Admitting: Physical Therapy

## 2020-02-12 ENCOUNTER — Ambulatory Visit
Admission: RE | Admit: 2020-02-12 | Discharge: 2020-02-12 | Disposition: A | Discharge status: Home / Self Care | Payer: Medicare Other | Source: Ambulatory Visit | Attending: Radiation Oncology | Admitting: Radiation Oncology

## 2020-02-12 ENCOUNTER — Ambulatory Visit: Payer: Medicare Other | Attending: Radiation Oncology | Admitting: Physical Therapy

## 2020-02-12 DIAGNOSIS — R1310 Dysphagia, unspecified: Secondary | ICD-10-CM | POA: Insufficient documentation

## 2020-02-12 DIAGNOSIS — Z510 Encounter for antineoplastic radiation therapy: Secondary | ICD-10-CM | POA: Diagnosis not present

## 2020-02-12 DIAGNOSIS — R293 Abnormal posture: Secondary | ICD-10-CM | POA: Diagnosis not present

## 2020-02-12 DIAGNOSIS — C321 Malignant neoplasm of supraglottis: Secondary | ICD-10-CM

## 2020-02-12 NOTE — Patient Instructions (Signed)
SWALLOWING EXERCISES Do these every day until 6 monhts after your last day of radiation  1. HARD Swallows- fast and strong    - Take sips if you need to - 10 times, 2 times a day, and use whenever you eat or drink  2. Swallow with your tongue sticking out - use a drop of water - 10 times, 2 times a day  3. Say "HEEEEE" in as high of a voice as you can - 20 times, 2 times a day  4. Swallow as hard as you can an hold  It for 5 seconds - use a drop of water  - 10  times, 2 times a day        5.  Super swallow - we will wait on this until next time due to your feeding tube

## 2020-02-12 NOTE — Therapy (Signed)
[place], [place], [address] Phone: [phone]   Fax:  [phone]  Physical Therapy Evaluation  Patient Details  Name: Anita Pollard MRN: [id number] Date of Birth: [birth year] Referring Provider (PT): Reita May Date: 02/12/2020    Past Medical History:  Diagnosis Date  . Alcohol abuse   . Anemia   . Arthritis   . Breast cancer ([place]) 2010   Right  . Chronic kidney disease   . Colon polyp   . Depression   . Diabetes mellitus   . GERD (gastroesophageal reflux disease)   . Heart murmur   . History of kidney stones 1976  . HTN (hypertension)   . Hypothyroidism   . Osteopenia   . Pancreatitis   . Ulcerative esophagitis     Past Surgical History:  Procedure Laterality Date  . ABDOMINAL HYSTERECTOMY    . ABDOMINOPLASTY    . APPENDECTOMY    . BREAST BIOPSY Right 08/03/2008   Stereo Bx, Malignant  . BREAST LUMPECTOMY Right 08/26/2008  . CATARACT EXTRACTION W/ INTRAOCULAR LENS  IMPLANT, BILATERAL    . CO2 LASER APPLICATION N/A 66/08/3014   Procedure: CO2 LASER APPLICATION;  Surgeon: Melida Quitter, MD;  Location: [facility];  Service: ENT;  Laterality: N/A;  . COSMETIC SURGERY    . ductal carcinoma     right breast   . EYE SURGERY    . HIP ARTHROPLASTY Right   . INSERTION OF DIALYSIS CATHETER Right 09/25/2012   Procedure: INSERTION OF DIALYSIS CATHETER;  Surgeon: Mal Misty, MD;  Location: [facility];  Service: Vascular;  Laterality: Right;  Righ Internal Jugular Placement  . IR GASTROSTOMY TUBE MOD SED  02/10/2020  . JOINT REPLACEMENT    . 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 MICROLARYNGOSCOPY N/A 10/20/2019   Procedure: Suspended Microlaryngoscopy with Biopsy;  Surgeon: Melida Quitter, MD;  Location: [facility] OR;  Service: ENT;  Laterality: N/A;  . MICROLARYNGOSCOPY N/A 01/09/2020   Procedure: MICROLARYNGOSCOPY WITH BIOPSY;  Surgeon: Melida Quitter, MD;  Location: [facility];  Service: ENT;   Laterality: N/A;  . RIGID ESOPHAGOSCOPY N/A 10/20/2019   Procedure: RIGID ESOPHAGOSCOPY;  Surgeon: Melida Quitter, MD;  Location: [facility];  Service: ENT;  Laterality: N/A;  . THYROIDECTOMY    . TUMMY TUCK      There were no vitals filed for this visit.    Subjective Assessment - 02/12/20 0901    Subjective This morning I was very nauseated. I am not sure if it was something I ate. I have kidney transplants so some of that is normal. I woke up and lost my rotator cuff on Tuesday and I got a shot in my shoulder and am getting PT.    Pertinent History malignant neoplasm of supraglottis, stage unknown, ongoing R sided throat pain since Feb; 10/20/19- microdirect laryngoscopy with biopsy revealed squamous mucosa with midl to moderable dysplasia; 01/09/20- second microdirect laryngoscopy and pathology revealed SCC; began radiation 02/09/20 to larynx and bilateral neck and will complete on 04/01/20; 02/10/20- PEG placed due to poor intake and difficulty swallowing ; hx of R breast cancer; bilateral kidney transplant on 09/2013    Patient Stated Goals to gain info from providers    Currently in Pain? Yes    Pain Score 5     Pain Location Abdomen    Pain Descriptors / Indicators Tender    Pain Type Surgical pain    Pain Onset In the past 7 days  Pain Frequency Constant    Aggravating Factors  changing positions    Pain Relieving Factors nothing    Effect of Pain on Daily Activities hurts to move around              [facility] PT Assessment - 02/12/20 0001      Assessment   Medical Diagnosis supraglottic cancer    Referring Provider (PT) Isidore Moos    Onset Date/Surgical Date 01/09/20    Hand Dominance Right    Prior Therapy currently receiving therapy for her bilateral shoulders      Precautions   Precautions Other (comment)   R hip replaced, at risk of lymphedema     Restrictions   Weight Bearing Restrictions No      Balance Screen   Has the patient fallen in the past 6 months No    Has the  patient had a decrease in activity level because of a fear of falling?  No    Is the patient reluctant to leave their home because of a fear of falling?  No      Home Environment   Living Environment Private residence    Living Arrangements Alone    Available Help at Discharge Family    Type of [place]      Prior Function   Level of [place] Retired    Leisure pt walks outside - totals 5,000 steps a day      Cognition   Overall Cognitive Status Within Functional Limits for tasks assessed      Functional Tests   Functional tests Sit to Stand      Sit to Stand   Comments 30 sit to stand: 15 reps which is excellient for her age      Posture/Postural Control   Posture/Postural Control Postural limitations    Postural Limitations Rounded Shoulders;Forward head      ROM / Strength   AROM / PROM / Strength AROM      AROM   Overall AROM  Deficits    Overall AROM Comments R shoulder WFL; L shoulder pt unable to move- is getting therapy for this    AROM Assessment Site Cervical    Cervical Flexion WFL     Cervical Extension WFL    Cervical - Right Side Bend WFL    Cervical - Left Side Bend WFL    Cervical - Right Rotation WFL    Cervical - Left Rotation [facility]      Ambulation/Gait   Ambulation/Gait Yes    Ambulation/Gait Assistance 7: Independent    Ambulation Distance (Feet) 15 Feet    Gait Pattern Within Functional Limits             LYMPHEDEMA/ONCOLOGY QUESTIONNAIRE - 02/12/20 0001      Lymphedema Assessments   Lymphedema Assessments Head and Neck      Head and Neck   4 cm superior to sternal notch around neck 32.4 cm    6 cm superior to sternal notch around neck 32.2 cm    8 cm superior to sternal notch around neck 34.5 cm                   Objective measurements completed on examination: See above findings.               PT Education - 02/12/20 1203    Education Details Neck ROM, importance of posture when  sitting, standing and lying down, deep breathing,  walking program and importance of staying active throughout treatment, CURE article on staying active, "Why exercise?" flyer, lymphedema and PT info    Person(s) Educated Patient    Methods Explanation;Handout    Comprehension Verbalized understanding               PT Long Term Goals - 02/12/20 1157      PT LONG TERM GOAL #1   Title Pt will return to baseline measurements and not demonstrate any signs of lymphedmea to allow pt to return to prior level of function    Time 8    Period Weeks    Status New    Target Date 04/08/20              Head and Neck Clinic Goals - 02/12/20 1157      Patient will be able to verbalize understanding of a home exercise program for cervical range of motion, posture, and walking.    Status Achieved      Patient will be able to verbalize understanding of proper sitting and standing posture.    Status Achieved      Patient will be able to verbalize understanding of lymphedema risk and availability of treatment for this condition.    Status Achieved              Plan - 02/12/20 1158    Clinical Impression Statement Pt presents to PT with newly diagnosed malignant neoplasm of supraglottis. Her cervical ROM is WFL. Her shoulder ROM is limited on the L side due to rotator cuff issues that she is currently receiving therapy for. Pt lives alone but does have a daughter that can help her if she needs it. She is independent with everything. Educated pt about signs and symptoms of lymphedema as well as anatomy and physiology of lymphatic system. Educated pt in importance of staying as active as possible throughout treatment to decrease fatigue and in head and neck ROM exercises to decrease loss of ROM.  Pt would benefit from a follow up appointment once she completes radiation to reassess baselines.    Personal Factors and Comorbidities Comorbidity 2    Comorbidities hx of R breast  cancer, hx of bilateral kidney transplants    Examination-Activity Limitations Lift;Carry;Sleep;Reach Overhead    Examination-Participation Restrictions Meal Prep;Cleaning    Stability/Clinical Decision Making Stable/Uncomplicated    Clinical Decision Making Low    Rehab Potential Good    PT Frequency --   eval and 1 f/u visit   PT Duration 8 weeks    PT Treatment/Interventions ADLs/Self Care Home Management;Patient/family education;Therapeutic exercise    PT Next Visit Plan reassess baselines    PT Home Exercise Plan head and neck ROM exercises    Consulted and Agree with Plan of Care Patient           Patient will benefit from skilled therapeutic intervention in order to improve the following deficits and impairments:     Visit Diagnosis: Abnormal posture  Malignant neoplasm of supraglottis [facility])     Problem List Patient Active Problem List   Diagnosis Date Noted  . Malignant neoplasm of supraglottis ([place]) 01/21/2020  . Pelvic mass in female 06/05/2013  . DM2 (diabetes mellitus, type 2) ([place]) 09/24/2012  . AKI (acute kidney injury) ([place]) 09/24/2012  . Elevated serum creatinine 09/03/2012  . Anemia, unspecified 09/03/2012  . Breast cancer ([place]) 08/31/2011  . HYPERTENSION, BENIGN 02/28/2010  . Carotid artery disease ([place]) 01/26/2010    Allyson Sabal [facility]  02/12/2020, 12:04 PM  [place], [place], [address] Phone: [phone]   Fax:  [phone]  Name: Anita Pollard MRN: [id number] Date of Birth: 01/04/1941  Manus Gunning, PT 02/12/20 12:04 PM

## 2020-02-12 NOTE — Therapy (Signed)
[facility] [address] [place], [place], [address] Phone: [phone]   Fax:  [phone]  Speech Language Pathology Evaluation  Patient Details  Name: Anita Pollard MRN: [id number] Date of Birth: 1940-07-30 Referring Provider (SLP): Eppie Gibson, MD   Encounter Date: 02/12/2020   End of Session - 02/12/20 1653    Visit Number 1    Number of Visits 7    Date for SLP Re-Evaluation 05/12/20   90 days   SLP Start Time 1147    SLP Stop Time  9678    SLP Time Calculation (min) 44 min    Activity Tolerance Patient tolerated treatment well           Past Medical History:  Diagnosis Date  . Alcohol abuse   . Anemia   . Arthritis   . Breast cancer ([place]) 2010   Right  . Chronic kidney disease   . Colon polyp   . Depression   . Diabetes mellitus   . GERD (gastroesophageal reflux disease)   . Heart murmur   . History of kidney stones 1976  . HTN (hypertension)   . Hypothyroidism   . Osteopenia   . Pancreatitis   . Ulcerative esophagitis     Past Surgical History:  Procedure Laterality Date  . ABDOMINAL HYSTERECTOMY    . ABDOMINOPLASTY    . APPENDECTOMY    . BREAST BIOPSY Right 08/03/2008   Stereo Bx, Malignant  . BREAST LUMPECTOMY Right 08/26/2008  . CATARACT EXTRACTION W/ INTRAOCULAR LENS  IMPLANT, BILATERAL    . CO2 LASER APPLICATION N/A 93/81/0175   Procedure: CO2 LASER APPLICATION;  Surgeon: Melida Quitter, MD;  Location: [facility];  Service: ENT;  Laterality: N/A;  . COSMETIC SURGERY    . ductal carcinoma     right breast   . EYE SURGERY    . HIP ARTHROPLASTY Right   . INSERTION OF DIALYSIS CATHETER Right 09/25/2012   Procedure: INSERTION OF DIALYSIS CATHETER;  Surgeon: Mal Misty, MD;  Location: [facility];  Service: Vascular;  Laterality: Right;  Righ Internal Jugular Placement  . IR GASTROSTOMY TUBE MOD SED  02/10/2020  . JOINT REPLACEMENT    . 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  MICROLARYNGOSCOPY N/A 10/20/2019   Procedure: Suspended Microlaryngoscopy with Biopsy;  Surgeon: Melida Quitter, MD;  Location: [facility] OR;  Service: ENT;  Laterality: N/A;  . MICROLARYNGOSCOPY N/A 01/09/2020   Procedure: MICROLARYNGOSCOPY WITH BIOPSY;  Surgeon: Melida Quitter, MD;  Location: [facility];  Service: ENT;  Laterality: N/A;  . RIGID ESOPHAGOSCOPY N/A 10/20/2019   Procedure: RIGID ESOPHAGOSCOPY;  Surgeon: Melida Quitter, MD;  Location: [facility];  Service: ENT;  Laterality: N/A;  . THYROIDECTOMY    . TUMMY TUCK      There were no vitals filed for this visit.       SLP Evaluation [facility] - 02/12/20 1159      SLP Visit Information   SLP Received On 02/12/20    Referring Provider (SLP) Eppie Gibson, MD    Onset Date February 2021    Medical Diagnosis Malignant neoplasm of supraglottis      Subjective   Patient/Family Stated Goal "keep swallowing well"      Pain Assessment   Currently in Pain? Yes    Pain Score 5     Pain Location Abdomen    Pain Type Surgical pain    Pain Onset In the past 7 days    Pain Frequency Constant  Pain Relieving Factors nothing    Effect of Pain on Daily Activities movement dificult      General Information   HPI Throat pain since February, 10-08-19 CT neck nodular thicking at top of epiglottis; 10-20-19 biopsy - no evidence of invasive CA. 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 PEG on 02-10-20 due to decr'd PO intake and difficulty swallowing. MBSS is ordered but not scheduled at this time.       Prior Functional Status   Cognitive/Linguistic Baseline Baseline deficits    Baseline deficit details memory - possibly      Cognition   Overall Cognitive Status Within Functional Limits for tasks assessed   ? memory   Area of Impairment --    Memory Comments Rn navigator states possible s/sx of memory deficits have been present - SLP to make HEP as simple as possible for pt.       Auditory Comprehension   Overall  Auditory Comprehension Appears within functional limits for tasks assessed      Verbal Expression   Overall Verbal Expression Appears within functional limits for tasks assessed      Oral Motor/Sensory Function   Overall Oral Motor/Sensory Function Appears within functional limits for tasks assessed      Motor Speech   Overall Motor Speech Appears within functional limits for tasks assessed    Phonation --   Harsh, gravelly - mild-mod "fora copule months"          Pt currently tolerates softer foods best, and thin-nectar liquids functionally - throat clear on 8/10 sips. No overt s/sx difficulty with solids today x4 bites.   Because data states the risk for dysphagia during and after radiation treatment is high due to undergoing radiation tx, SLP taught pt about the possibility of reduced/limited ability for PO intake during rad tx. SLP encouraged pt to continue swallowing POs as far into rad tx as possible. SLP talked about performing only non-swallowing tasks on the handout/HEP. Pt demonstrated understanding of these things to SLP by repeating back to SLP.  SLP educated pt re: changes to swallowing musculature after rad tx, and why adherence to dysphagia HEP provided today and PO consumption was necessary to inhibit muscular disuse atrophy and to reduce muscle fibrosis following rad tx. Pt demonstrated understanding of these things to SLP by acknowledging non-verbally (head nod).    SLP then developed a HEP for pt and pt was instructed how to perform exercises involving lingual, vocal, and pharyngeal strengthening. SLP performed each exercise and pt return demonstrated each exercise. SLP ensured pt performance was correct prior to moving on to next exercise. Pt was instructed to complete this program 2 times a day,until 6 months after her last rad tx, then x2 a week after that.                  SLP Education - 02/12/20 1312    Education Details HEP procedure, late effects  head/neck raditaion on swallow function, continue with near-nectar consistency liquids    Person(s) Educated Patient    Methods Explanation;Demonstration;Verbal cues;Handout    Comprehension Verbalized understanding;Returned demonstration;Verbal cues required;Need further instruction            SLP Short Term Goals - 02/12/20 1701      SLP SHORT TERM GOAL #1   Title Pt will tell SLP rationale for HEP completion    Time 1    Period --   visit, for all STGs  Status New      SLP SHORT TERM GOAL #2   Title Pt will complete HEP with occasional min A over two sessions    Time 2    Status New      SLP SHORT TERM GOAL #3   Title pt will tell SLP 3 overt s/sx aspiration PNA    Time 3    Status New      SLP SHORT TERM GOAL #4   Title Pt will tell SLP how a food journal can hasten/facilitate return to more normalized diet    Time 2    Status New            SLP Long Term Goals - 02/12/20 1702      SLP LONG TERM GOAL #1   Title Pt will complete HEP with rare min A over 3 visits    Time 4    Period --   visits, for all LTGs   Status New      SLP LONG TERM GOAL #2   Title Pt will tell SLP when to decr frequency of HEP    Time 5    Status New      SLP LONG TERM GOAL #3   Title pt will tell SLP how to incr difficulty of HEP, with min A    Time 6    Status New            Plan - 02/12/20 1653    Clinical Impression Statement Pt presents today with [facility] swallowing ability with cheese stick and thin-nectar water - throat clearing on 8/10 sips of slightly thckened H2O, no overt difficulties noted with solids today. Pt to undergo modified (MBSS) -ordered but not scheduled at this time. No overt s/sx aspiration PNA reported or observed today. Data suggests that as pts progress through rad or chemorad therapy that their swallowing ability will decrease. Also, WNL swallowing is threatened by muscle fibrosis that will likely develop after rad/chemorad is completed. Therefore, SLP  developed an indivdualized exercise program to mitigate/eliminate pt's muscle fibrosis following and as a result of, pt's rad tx. Skilled ST would be beneficial to the pt in order to regularly assess pt's safety with POs and/or need for instrumental swallow assessment, as well as to assess accurate completion of swallowing HEP.    Speech Therapy Frequency --   once approx every 4 weeks   Duration --   7 total sessions   Treatment/Interventions Aspiration precaution training;Pharyngeal strengthening exercises;Compensatory techniques;Diet toleration management by SLP;Trials of upgraded texture/liquids;Cognitive reorganization;Internal/external aids;Patient/family education;SLP instruction and feedback    Potential to [place] provided today    Consulted and Agree with Plan of Care Patient           Patient will benefit from skilled therapeutic intervention in order to improve the following deficits and impairments:   Dysphagia, unspecified type    Problem List Patient Active Problem List   Diagnosis Date Noted  . Malignant neoplasm of supraglottis ([place]) 01/21/2020  . Pelvic mass in female 06/05/2013  . DM2 (diabetes mellitus, type 2) ([place]) 09/24/2012  . AKI (acute kidney injury) ([place]) 09/24/2012  . Elevated serum creatinine 09/03/2012  . Anemia, unspecified 09/03/2012  . Breast cancer ([place]) 08/31/2011  . HYPERTENSION, BENIGN 02/28/2010  . Carotid artery disease ([place]) 01/26/2010    Lollie Gunner ,MS, [place]  02/12/2020, 5:04 PM  [facility] [address] [place], [place],  [address] Phone: [phone]   Fax:  [phone]  Name: Anita Pollard MRN: [id number] Date of Birth: 03/24/1941

## 2020-02-13 ENCOUNTER — Ambulatory Visit
Admission: RE | Admit: 2020-02-13 | Discharge: 2020-02-13 | Disposition: A | Discharge status: Home / Self Care | Payer: Medicare Other | Source: Ambulatory Visit | Attending: Radiation Oncology | Admitting: Radiation Oncology

## 2020-02-13 DIAGNOSIS — Z510 Encounter for antineoplastic radiation therapy: Secondary | ICD-10-CM | POA: Diagnosis not present

## 2020-02-15 ENCOUNTER — Ambulatory Visit
Admission: RE | Admit: 2020-02-15 | Discharge: 2020-02-15 | Disposition: A | Discharge status: Home / Self Care | Payer: Medicare Other | Source: Ambulatory Visit | Attending: Radiation Oncology | Admitting: Radiation Oncology

## 2020-02-15 ENCOUNTER — Other Ambulatory Visit: Payer: Self-pay

## 2020-02-15 DIAGNOSIS — Z510 Encounter for antineoplastic radiation therapy: Secondary | ICD-10-CM | POA: Diagnosis not present

## 2020-02-16 ENCOUNTER — Ambulatory Visit
Admission: RE | Admit: 2020-02-16 | Discharge: 2020-02-16 | Disposition: A | Discharge status: Home / Self Care | Payer: Medicare Other | Source: Ambulatory Visit | Attending: Radiation Oncology | Admitting: Radiation Oncology

## 2020-02-16 ENCOUNTER — Other Ambulatory Visit: Payer: Self-pay

## 2020-02-16 ENCOUNTER — Ambulatory Visit (HOSPITAL_COMMUNITY)
Admission: RE | Admit: 2020-02-16 | Discharge: 2020-02-16 | Disposition: A | Discharge status: Home / Self Care | Payer: Medicare Other | Source: Ambulatory Visit | Attending: Radiation Oncology | Admitting: Radiation Oncology

## 2020-02-16 DIAGNOSIS — C321 Malignant neoplasm of supraglottis: Secondary | ICD-10-CM | POA: Insufficient documentation

## 2020-02-16 DIAGNOSIS — Z510 Encounter for antineoplastic radiation therapy: Secondary | ICD-10-CM | POA: Diagnosis not present

## 2020-02-17 ENCOUNTER — Other Ambulatory Visit: Payer: Self-pay

## 2020-02-17 ENCOUNTER — Ambulatory Visit
Admission: RE | Admit: 2020-02-17 | Discharge: 2020-02-17 | Disposition: A | Discharge status: Home / Self Care | Payer: Medicare Other | Source: Ambulatory Visit | Attending: Radiation Oncology | Admitting: Radiation Oncology

## 2020-02-17 ENCOUNTER — Inpatient Hospital Stay: Payer: Medicare Other

## 2020-02-17 DIAGNOSIS — Z510 Encounter for antineoplastic radiation therapy: Secondary | ICD-10-CM | POA: Diagnosis not present

## 2020-02-17 MED ORDER — GLUCERNA 1.5 CAL PO LIQD: ORAL | 2 refills | Status: DC

## 2020-02-17 NOTE — Progress Notes (Addendum)
Nutrition Follow-up:  Patient with supraglottis cancer.  Patient receiving radiation.  G-tube placed on 11/16.   Met with patient following radiation. Reports that she gave 1 carton of osmolite 1.5 via feeding tube on Friday, Nov  19 at 9am. Ate lunch of 1/2 sandwich (ham and cheese) at noon then did not eat anything and checked blood glucose at 4:30pm and was 300.  Concerned about elevated blood glucose and tube feeding.  Patient has been meeting with endocrinology.  Reports that she has not taken anymore osmolite because afraid of elevated blood glucose.  Ate 2 eggs and toast this am with thickened water. Some mornings have been eating waffle with berries and sugar free syrup or oatmeal with berries and walnuts.  Supper last night was soup with vegetables and chicken.  Continues with drinking thickened water.  Has seen SLP.  Planning MBSS.    Recent KUB showed constipation per RN Navigator and Dr Isidore Moos has recommended miralax daily.     Medications: reviewed  Labs: no new  Anthropometrics:   Weight 11/22 126 lb 4 oz  11/9 126 lb 5 oz 08/2019 132 lb   Estimated Energy Needs  Kcals: 1700-1900 Protein: 85-95 g Fluid: 1.7 L  NUTRITION DIAGNOSIS: Predicted suboptimal energy intake continues   INTERVENTION:  Recommend glucerna 1.5 (lower in carbohydrate formula) with patient concerns about elevated blood glucose and history of DM.  Patient will need 5 cartons (1 carton 5 times) daily if unable to consume oral intake to meet nutritional needs.  Instructed patient to give 1 carton daily.  Titration of tube feeding will continue with decrease in oral intake from radiation side effects.  Flush with 60 ml of water before and after feeding.   Tube feeding will provide 1780 calories, 98 g protein and 900 ml water from formula alone plus additional 600 ml of water with water flush once at goal rate for total of 1500 ml free water. Meets 100% of calorie needs  Encouraged oral intake as tolerated.   RD to contact [facility] for enteral supplies and formula.    MONITORING, EVALUATION, GOAL: weight trends, intake, tube feeding   NEXT VISIT: Dec 2nd after radiation  Takeela Peil B. Zenia Resides, [place], [place] Registered Dietitian [phone] (mobile)

## 2020-02-17 NOTE — Progress Notes (Signed)
Oncology Nurse Navigator Documentation  I met with Ms. Nase after her radiation treatment today and accompanied her to her nutrition appointment. I informed her of the results from her abdominal x-ray yesterday and advised her to begin miralax twice daily until her abdomin felt less distended and maintain miralax daily throughout her treatment. She voiced her understanding and reports several bowel movements already today. She knows to call me if she has any further concerns or questions.  Harlow Asa RN, BSN, OCN Head & Neck Oncology Nurse [place] at [facility] Phone # [phone]  Fax # [phone]

## 2020-02-18 ENCOUNTER — Ambulatory Visit
Admission: RE | Admit: 2020-02-18 | Discharge: 2020-02-18 | Disposition: A | Discharge status: Home / Self Care | Payer: Medicare Other | Source: Ambulatory Visit | Attending: Radiation Oncology | Admitting: Radiation Oncology

## 2020-02-18 DIAGNOSIS — Z510 Encounter for antineoplastic radiation therapy: Secondary | ICD-10-CM | POA: Diagnosis not present

## 2020-02-20 ENCOUNTER — Ambulatory Visit (HOSPITAL_COMMUNITY)
Admission: RE | Admit: 2020-02-20 | Discharge: 2020-02-20 | Disposition: A | Discharge status: Home / Self Care | Payer: Medicare Other | Source: Ambulatory Visit | Attending: Radiation Oncology | Admitting: Radiation Oncology

## 2020-02-20 ENCOUNTER — Other Ambulatory Visit: Payer: Self-pay

## 2020-02-20 DIAGNOSIS — R1310 Dysphagia, unspecified: Secondary | ICD-10-CM | POA: Insufficient documentation

## 2020-02-20 DIAGNOSIS — R059 Cough, unspecified: Secondary | ICD-10-CM | POA: Diagnosis present

## 2020-02-20 DIAGNOSIS — C321 Malignant neoplasm of supraglottis: Secondary | ICD-10-CM | POA: Diagnosis present

## 2020-02-23 ENCOUNTER — Ambulatory Visit
Admission: RE | Admit: 2020-02-23 | Discharge: 2020-02-23 | Disposition: A | Discharge status: Home / Self Care | Payer: Medicare Other | Source: Ambulatory Visit | Attending: Radiation Oncology | Admitting: Radiation Oncology

## 2020-02-23 ENCOUNTER — Other Ambulatory Visit: Payer: Self-pay | Admitting: Radiation Oncology

## 2020-02-23 DIAGNOSIS — C321 Malignant neoplasm of supraglottis: Secondary | ICD-10-CM

## 2020-02-23 DIAGNOSIS — Z510 Encounter for antineoplastic radiation therapy: Secondary | ICD-10-CM | POA: Diagnosis not present

## 2020-02-23 MED ORDER — SONAFINE EX EMUL
1.0000 "application " | Freq: Two times a day (BID) | CUTANEOUS | Status: DC
  Administered 2020-02-23: 1 via TOPICAL

## 2020-02-23 MED ORDER — LIDOCAINE VISCOUS HCL 2 % MT SOLN: OROMUCOSAL | 4 refills | Status: DC

## 2020-02-24 ENCOUNTER — Ambulatory Visit
Admission: RE | Admit: 2020-02-24 | Discharge: 2020-02-24 | Disposition: A | Discharge status: Home / Self Care | Payer: Medicare Other | Source: Ambulatory Visit | Attending: Radiation Oncology | Admitting: Radiation Oncology

## 2020-02-24 DIAGNOSIS — Z510 Encounter for antineoplastic radiation therapy: Secondary | ICD-10-CM | POA: Diagnosis not present

## 2020-02-25 ENCOUNTER — Ambulatory Visit
Admission: RE | Admit: 2020-02-25 | Discharge: 2020-02-25 | Disposition: A | Discharge status: Home / Self Care | Payer: Medicare Other | Source: Ambulatory Visit | Attending: Radiation Oncology | Admitting: Radiation Oncology

## 2020-02-25 DIAGNOSIS — C321 Malignant neoplasm of supraglottis: Secondary | ICD-10-CM | POA: Insufficient documentation

## 2020-02-26 ENCOUNTER — Ambulatory Visit
Admission: RE | Admit: 2020-02-26 | Discharge: 2020-02-26 | Disposition: A | Discharge status: Home / Self Care | Payer: Medicare Other | Source: Ambulatory Visit | Attending: Radiation Oncology | Admitting: Radiation Oncology

## 2020-02-26 ENCOUNTER — Inpatient Hospital Stay: Payer: Medicare Other | Attending: Radiation Oncology | Admitting: Nutrition

## 2020-02-26 DIAGNOSIS — C321 Malignant neoplasm of supraglottis: Secondary | ICD-10-CM | POA: Diagnosis not present

## 2020-02-26 NOTE — Progress Notes (Signed)
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 Reports she can no longer eat much by mouth. She is trying to drink water and reports she swallows better with thickened water. Current weight documented as 126.4 pounds December 2 down slightly from 127.8 pounds November 15. Noted glucose 89-316. RD wrote new tube feeding orders on November 23.  Patient states she has not received her Glucerna 1.5 yet. Reports compliance with swallowing exercises. She reports she tries to do everything everybody has told her to do. She still has increased anxiety.  Estimated nutrition needs: 1700-1900 cal, 85-95 g protein, 1.7 L fluid.  Nutrition diagnosis: Predicted suboptimal energy intake has evolved into inadequate oral intake related to supraglottis cancer and associated treatments as evidenced by patient's self-report of inability to consume nutrition by mouth.  Intervention: Recommended patient use Glucerna 1.5 via feeding tube, 1 carton every 3 hours for a total of 5 cartons daily. Flush with 60 mL of free water before and after tube feeding. 5 cartons Glucerna 1.5+ free water flushes provides 1780 cal, 98 g protein, and 1500 mL free water. This meets 100% calorie and protein needs. Patient to continue free water as tolerated by mouth. Will contact [facility] to ensure tube feeding is delivered.  Monitoring, evaluation, goals: Patient will tolerate tube feeding at goal rate to meet estimated nutrition needs for weight maintenance and healing.  Next visit: Wednesday, December 8 after radiation therapy.  **Disclaimer: This note was dictated with voice recognition software. Similar sounding words can inadvertently be transcribed and this note may contain transcription errors which may not have been corrected upon publication of note.**

## 2020-02-27 ENCOUNTER — Ambulatory Visit
Admission: RE | Admit: 2020-02-27 | Discharge: 2020-02-27 | Disposition: A | Discharge status: Home / Self Care | Payer: Medicare Other | Source: Ambulatory Visit | Attending: Radiation Oncology | Admitting: Radiation Oncology

## 2020-02-27 DIAGNOSIS — C321 Malignant neoplasm of supraglottis: Secondary | ICD-10-CM | POA: Diagnosis not present

## 2020-03-01 ENCOUNTER — Other Ambulatory Visit: Payer: Self-pay | Admitting: Radiation Oncology

## 2020-03-01 ENCOUNTER — Ambulatory Visit
Admission: RE | Admit: 2020-03-01 | Discharge: 2020-03-01 | Disposition: A | Discharge status: Home / Self Care | Payer: Medicare Other | Source: Ambulatory Visit | Attending: Radiation Oncology | Admitting: Radiation Oncology

## 2020-03-01 DIAGNOSIS — C321 Malignant neoplasm of supraglottis: Secondary | ICD-10-CM | POA: Diagnosis not present

## 2020-03-01 MED ORDER — HYDROCODONE-ACETAMINOPHEN 7.5-325 MG/15ML PO SOLN: 10.0000 mL | Freq: Four times a day (QID) | ORAL | 0 refills | Status: DC | PRN

## 2020-03-01 NOTE — Progress Notes (Signed)
Oncology Nurse Navigator Documentation  Met with patient during Weekly Under Treat appointment with Dr. Isidore Moos. I offered support, encouragement regarding her current treatment. I answered questions regarding symptoms and upcoming appointments for Anita Pollard. She knows to call me if she has any questions or concerns.   Harlow Asa RN, BSN, OCN Head & Neck Oncology Nurse [place] [facility] Phone # [phone]  Fax # [phone]

## 2020-03-02 ENCOUNTER — Ambulatory Visit
Admission: RE | Admit: 2020-03-02 | Discharge: 2020-03-02 | Disposition: A | Discharge status: Home / Self Care | Payer: Medicare Other | Source: Ambulatory Visit | Attending: Radiation Oncology | Admitting: Radiation Oncology

## 2020-03-02 DIAGNOSIS — C321 Malignant neoplasm of supraglottis: Secondary | ICD-10-CM | POA: Diagnosis not present

## 2020-03-03 ENCOUNTER — Ambulatory Visit
Admission: RE | Admit: 2020-03-03 | Discharge: 2020-03-03 | Disposition: A | Discharge status: Home / Self Care | Payer: Medicare Other | Source: Ambulatory Visit | Attending: Radiation Oncology | Admitting: Radiation Oncology

## 2020-03-03 ENCOUNTER — Other Ambulatory Visit: Payer: Self-pay

## 2020-03-03 ENCOUNTER — Inpatient Hospital Stay: Payer: Medicare Other | Admitting: Nutrition

## 2020-03-03 DIAGNOSIS — C321 Malignant neoplasm of supraglottis: Secondary | ICD-10-CM | POA: Diagnosis not present

## 2020-03-03 NOTE — Progress Notes (Signed)
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 Patient complains of thickened saliva. She continues to try to drink water by mouth. Weight is decreased and documented as 121.8 pounds on December 6.  This is decreased from 126.4 pounds December 2. Patient reports MD increase free water 100 mL before and after each bolus feeding, which is 5 times daily. Also reports MD is adjusting insulin. She reports occasional nausea which is controlled with medications when she takes them.   She is feeling discouraged. She reports her feeding tube site is bleeding. she stated bright red blood noted last evening when she was cleaning her feeding tube.  She reports it is still "a little" bloody this morning. She has adequate tube feeding supplies. 5 cartons of Glucerna 1.5+ free water flushes provides 1780 cal, 98 g protein, and 1900 mL free water.  Estimated nutrition needs: 1700-1900 cal, 85-95 g protein, 1.7 L fluid.  Nutrition diagnosis: Inadequate oral intake continues.  Intervention: Monitor weight.  May need to increase Glucerna 1.5-6 cartons daily if patient can tolerate increased volume. Agree with 100 mL of free water flushes before and after bolus tube feedings. Provided support and encouragement. Referred patient to radiation oncology nursing to evaluate feeding tube.  Monitoring, evaluation, goals: Patient will tolerate adequate calories and protein to minimize weight loss.  Next visit: Wednesday,December 15.  **Disclaimer: This note was dictated with voice recognition software. Similar sounding words can inadvertently be transcribed and this note may contain transcription errors which may not have been corrected upon publication of note.**

## 2020-03-04 ENCOUNTER — Ambulatory Visit
Admission: RE | Admit: 2020-03-04 | Discharge: 2020-03-04 | Disposition: A | Discharge status: Home / Self Care | Payer: Medicare Other | Source: Ambulatory Visit | Attending: Radiation Oncology | Admitting: Radiation Oncology

## 2020-03-04 DIAGNOSIS — C321 Malignant neoplasm of supraglottis: Secondary | ICD-10-CM | POA: Diagnosis not present

## 2020-03-05 ENCOUNTER — Ambulatory Visit
Admission: RE | Admit: 2020-03-05 | Discharge: 2020-03-05 | Disposition: A | Discharge status: Home / Self Care | Payer: Medicare Other | Source: Ambulatory Visit | Attending: Radiation Oncology | Admitting: Radiation Oncology

## 2020-03-05 DIAGNOSIS — C321 Malignant neoplasm of supraglottis: Secondary | ICD-10-CM | POA: Diagnosis not present

## 2020-03-07 ENCOUNTER — Ambulatory Visit: Payer: Medicare Other

## 2020-03-08 ENCOUNTER — Encounter: Payer: Medicare Other | Admitting: Nutrition

## 2020-03-08 ENCOUNTER — Other Ambulatory Visit: Payer: Self-pay | Admitting: Radiation Oncology

## 2020-03-08 ENCOUNTER — Ambulatory Visit
Admission: RE | Admit: 2020-03-08 | Discharge: 2020-03-08 | Disposition: A | Discharge status: Home / Self Care | Payer: Medicare Other | Source: Ambulatory Visit | Attending: Radiation Oncology | Admitting: Radiation Oncology

## 2020-03-08 ENCOUNTER — Other Ambulatory Visit: Payer: Self-pay

## 2020-03-08 DIAGNOSIS — C321 Malignant neoplasm of supraglottis: Secondary | ICD-10-CM

## 2020-03-08 MED ORDER — HYDROCODONE-ACETAMINOPHEN 7.5-325 MG/15ML PO SOLN: 10.0000 mL | Freq: Four times a day (QID) | ORAL | 0 refills | Status: DC | PRN

## 2020-03-08 MED ORDER — SONAFINE EX EMUL
1.0000 "application " | Freq: Two times a day (BID) | CUTANEOUS | Status: DC
  Administered 2020-03-08: 1 via TOPICAL

## 2020-03-09 ENCOUNTER — Ambulatory Visit
Admission: RE | Admit: 2020-03-09 | Discharge: 2020-03-09 | Disposition: A | Discharge status: Home / Self Care | Payer: Medicare Other | Source: Ambulatory Visit | Attending: Radiation Oncology | Admitting: Radiation Oncology

## 2020-03-09 ENCOUNTER — Telehealth: Payer: Self-pay

## 2020-03-09 DIAGNOSIS — C321 Malignant neoplasm of supraglottis: Secondary | ICD-10-CM | POA: Diagnosis not present

## 2020-03-09 NOTE — Telephone Encounter (Signed)
Patient left a VM concerned about where her physical therapist had tapped her left shoulder. She worried that the tape would interfere with her continuing to receive radiation, and she requested a return call to discuss the matter.   Returned patient's call to see where exactly tape was on her body, and she described an area outside her bra strap that is outside the radiation field. Patient stated she decided to take the tape off to be on the safe side. Told patient the radiation therapists would evaluate the area when she came in for treatment today (and consult Dr. Isidore Moos if they had any doubts), but most likely her physical therapist could re-tape the area when patient sees him again next Monday without any concern of interfering with radiation. Patient verbalized understanding and agreement of plan, and denied any other needs at this time.   Information relayed to therapists on L2 and Dr. Isidore Moos.

## 2020-03-10 ENCOUNTER — Inpatient Hospital Stay: Payer: Medicare Other

## 2020-03-10 ENCOUNTER — Ambulatory Visit
Admission: RE | Admit: 2020-03-10 | Discharge: 2020-03-10 | Disposition: A | Discharge status: Home / Self Care | Payer: Medicare Other | Source: Ambulatory Visit | Attending: Radiation Oncology | Admitting: Radiation Oncology

## 2020-03-10 DIAGNOSIS — C321 Malignant neoplasm of supraglottis: Secondary | ICD-10-CM | POA: Diagnosis not present

## 2020-03-10 NOTE — Progress Notes (Signed)
Nutrition Follow-up:  Patient with supraglottis cancer, receiving radiation.   Met with patient following radiation treatment today.  Patient reports that she is giving 5 cartons of glucerna 1.5  (daily).  Flushing with 146m water before and after per MD.  Reports took laxative on Saturday and had diarrhea, resolved now.     Medications: reviewed  Labs: reviewed  Anthropometrics:   Weight 129 lb 6 oz today on RD scale increased from 126 lb 2 oz on 12/13 per Aria  Patient thinks she may have swelling in ankles.       Estimated Energy Needs  Kcals: 1700-1900 cal Protein: 85-95 g Fluid: 1.7 L  NUTRITION DIAGNOSIS: Inadequate oral intake but nutrition needs be addressed with feeding tube   INTERVENTION:  Continue glucerna 1.5, 5 cartons per day and 1065mwater flush before and after per MD request.   Encouraged patient to discuss swelling with MD.   Message sent to Dr SqIsidore Moosnd RN Navigator regarding weight increase and swelling.   Patient has contact information    MONITORING, EVALUATION, GOAL: weight trends, tube feeding   NEXT VISIT: Dec 22 after radiation  Anita Quinn B. AlZenia ResidesRDWindsor HeightsLDHinsdaleegistered Dietitian [phone])

## 2020-03-11 ENCOUNTER — Other Ambulatory Visit: Payer: Self-pay

## 2020-03-11 ENCOUNTER — Ambulatory Visit
Admission: RE | Admit: 2020-03-11 | Discharge: 2020-03-11 | Disposition: A | Discharge status: Home / Self Care | Payer: Medicare Other | Source: Ambulatory Visit | Attending: Radiation Oncology | Admitting: Radiation Oncology

## 2020-03-11 ENCOUNTER — Ambulatory Visit: Payer: Medicare Other | Attending: Radiation Oncology

## 2020-03-11 DIAGNOSIS — C321 Malignant neoplasm of supraglottis: Secondary | ICD-10-CM | POA: Diagnosis not present

## 2020-03-11 DIAGNOSIS — R1310 Dysphagia, unspecified: Secondary | ICD-10-CM | POA: Diagnosis present

## 2020-03-11 NOTE — Therapy (Signed)
[facility] [address] [place], [place], [address] Phone: [phone]   Fax:  [phone]  Speech Language Pathology Treatment  Patient Details  Name: Anita Pollard MRN: [id number] Date of Birth: 1940/06/02 Referring Provider (SLP): Eppie Gibson, MD   Encounter Date: 03/11/2020   End of Session - 03/11/20 1532    Visit Number 2    Number of Visits 7    Date for SLP Re-Evaluation 05/12/20   90 days   SLP Start Time 7510    SLP Stop Time  1440    SLP Time Calculation (min) 36 min    Activity Tolerance Patient tolerated treatment well           Past Medical History:  Diagnosis Date  . Alcohol abuse   . Anemia   . Arthritis   . Breast cancer ([place]) 2010   Right  . Chronic kidney disease   . Colon polyp   . Depression   . Diabetes mellitus   . GERD (gastroesophageal reflux disease)   . Heart murmur   . History of kidney stones 1976  . HTN (hypertension)   . Hypothyroidism   . Osteopenia   . Pancreatitis   . Ulcerative esophagitis     Past Surgical History:  Procedure Laterality Date  . ABDOMINAL HYSTERECTOMY    . ABDOMINOPLASTY    . APPENDECTOMY    . BREAST BIOPSY Right 08/03/2008   Stereo Bx, Malignant  . BREAST LUMPECTOMY Right 08/26/2008  . CATARACT EXTRACTION W/ INTRAOCULAR LENS  IMPLANT, BILATERAL    . CO2 LASER APPLICATION N/A 25/85/2778   Procedure: CO2 LASER APPLICATION;  Surgeon: Melida Quitter, MD;  Location: [facility];  Service: ENT;  Laterality: N/A;  . COSMETIC SURGERY    . ductal carcinoma     right breast   . EYE SURGERY    . HIP ARTHROPLASTY Right   . INSERTION OF DIALYSIS CATHETER Right 09/25/2012   Procedure: INSERTION OF DIALYSIS CATHETER;  Surgeon: Mal Misty, MD;  Location: [facility];  Service: Vascular;  Laterality: Right;  Righ Internal Jugular Placement  . IR GASTROSTOMY TUBE MOD SED  02/10/2020  . JOINT REPLACEMENT    . 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  MICROLARYNGOSCOPY N/A 10/20/2019   Procedure: Suspended Microlaryngoscopy with Biopsy;  Surgeon: Melida Quitter, MD;  Location[facility];  Service: ENT;  Laterality: N/A;  . MICROLARYNGOSCOPY N/A 01/09/2020   Procedure: MICROLARYNGOSCOPY WITH BIOPSY;  Surgeon: Melida Quitter, MD;  Location: [facility];  Service: ENT;  Laterality: N/A;  . RIGID ESOPHAGOSCOPY N/A 10/20/2019   Procedure: RIGID ESOPHAGOSCOPY;  Surgeon: Melida Quitter, MD;  Location: [facility];  Service: ENT;  Laterality: N/A;  . THYROIDECTOMY    . TUMMY TUCK      There were no vitals filed for this visit.   Subjective Assessment - 03/11/20 1519    Subjective "I've done these (HEP) twice a day like you asked."    Currently in Pain? No/denies   hydrocodone prior ot visit                ADULT SLP TREATMENT - 03/11/20 0001      General Information   Behavior/Cognition Alert;Cooperative;Pleasant mood      Treatment Provided   Treatment provided Dysphagia      Dysphagia Treatment   Temperature Spikes Noted No    Respiratory Status Room air    Treatment Methods Skilled observation;Therapeutic exercise;Patient/caregiver education    Patient observed directly  with PO's Yes    Type of PO's observed Dysphagia 3 (soft);Thin liquids    Liquids provided via Cup    Oral Phase Signs & Symptoms Prolonged mastication    Pharyngeal Phase Signs & Symptoms Other (comment)   none seen   Other treatment/comments Pt reports eating and drinking something every day. SPL congratulated her on this. She req'd extra time to tell SLP rationale for HEP. HEP performed with independence. SLP added super supraglottic with modification of pulling up on chair seat instead of bearing down due to pt's fear of possiblility of blood at PEG site. Pt recalled rare difficulty (throat clearing) when she chews something "for a long time" like today with cheese. SPL told her to swallow what she has in saliva/broken down solids, if she notices she is taking a longer time to  chew the item - then complete the mastication process and swallow everything else. SLP explained mixed consistencies to pt. SLP told pt in the next 4 weeks she may need to ramp down on the HEP reps and this was fine but as she improves in success with swallowing and less pain she needs to ramp back up again. Anita Pollard voiced understanding.      Assessment / Recommendations / Plan   Plan Continue with current plan of care      Dysphagia Recommendations   Diet recommendations --   as tolerated   Medication Administration --   as tolerated     Progression Toward Goals   Progression toward goals Progressing toward goals            SLP Education - 03/11/20 1531    Education Details mixed consistencies (swallow saliva and then continue chewing and swallow all down together)    Person(s) Educated Patient    Methods Explanation    Comprehension Verbalized understanding            SLP Short Term Goals - 03/11/20 1535      SLP SHORT TERM GOAL #1   Title Pt will tell SLP rationale for HEP completion    Period --   visit, for all STGs   Status Achieved      SLP SHORT TERM GOAL #2   Title Pt will complete HEP with occasional min A over two sessions    Baseline 03-11-20    Time 1    Status On-going      SLP SHORT TERM GOAL #3   Title pt will tell SLP 3 overt s/sx aspiration PNA    Time 2    Status On-going      SLP SHORT TERM GOAL #4   Title Pt will tell SLP how a food journal can hasten/facilitate return to more normalized diet    Time 1    Status On-going            SLP Long Term Goals - 03/11/20 1536      SLP LONG TERM GOAL #1   Title Pt will complete HEP with rare min A over 3 visits    Time 3    Period --   visits, for all LTGs   Status On-going      SLP LONG TERM GOAL #2   Title Pt will tell SLP when to decr frequency of HEP    Time 4    Status On-going      SLP LONG TERM GOAL #3   Title pt will tell SLP how to incr difficulty of HEP, with min A  Time 5     Status On-going            Plan - 03/11/20 1532    Clinical Impression Statement Pt presents today with WFL/WNL swallowing ability with cheese stick and nectar water.  No overt difficulties noted with liquids or solids today. Modified (MBSS) on 02-20-20 recommended regular diet with nectar liquids. No overt s/sx aspiration PNA reported or observed today. Data suggests that as pts progress through rad or chemorad therapy that their swallowing ability will decrease. Also, WNL swallowing is threatened by muscle fibrosis that will likely develop after rad/chemorad is completed. Therefore, SLP developed an indivdualized exercise program to mitigate/eliminate pt's muscle fibrosis following and as a result of, pt's rad tx. Skilled ST would be beneficial to the pt in order to regularly assess pt's safety with POs and/or need for instrumental swallow assessment, as well as to assess accurate completion of swallowing HEP.    Speech Therapy Frequency --   once approx every 4 weeks   Duration --   7 total sessions   Treatment/Interventions Aspiration precaution training;Pharyngeal strengthening exercises;Compensatory techniques;Diet toleration management by SLP;Trials of upgraded texture/liquids;Cognitive reorganization;Internal/external aids;Patient/family education;SLP instruction and feedback    Potential to [place] provided today    Consulted and Agree with Plan of Care Patient           Patient will benefit from skilled therapeutic intervention in order to improve the following deficits and impairments:   Dysphagia, unspecified type    Problem List Patient Active Problem List   Diagnosis Date Noted  . Malignant neoplasm of supraglottis ([place]) 01/21/2020  . Pelvic mass in female 06/05/2013  . DM2 (diabetes mellitus, type 2) ([place]) 09/24/2012  . AKI (acute kidney injury) ([place] [facility]) 09/24/2012  . Elevated serum creatinine 09/03/2012  . Anemia, unspecified  09/03/2012  . Breast cancer ([place]) 08/31/2011  . HYPERTENSION, BENIGN 02/28/2010  . Carotid artery disease ([place]) 01/26/2010    [facility], [place]  03/11/2020, 3:36 PM  [facility] [address] [place], [place], [address] Phone: [phone]   Fax:  [phone]   Name: Anita Pollard MRN: [id number] Date of Birth: Aug 10, 1940

## 2020-03-12 ENCOUNTER — Ambulatory Visit
Admission: RE | Admit: 2020-03-12 | Discharge: 2020-03-12 | Disposition: A | Discharge status: Home / Self Care | Payer: Medicare Other | Source: Ambulatory Visit | Attending: Radiation Oncology | Admitting: Radiation Oncology

## 2020-03-12 DIAGNOSIS — C321 Malignant neoplasm of supraglottis: Secondary | ICD-10-CM | POA: Diagnosis not present

## 2020-03-15 ENCOUNTER — Ambulatory Visit
Admission: RE | Admit: 2020-03-15 | Discharge: 2020-03-15 | Disposition: A | Discharge status: Home / Self Care | Payer: Medicare Other | Source: Ambulatory Visit | Attending: Radiation Oncology | Admitting: Radiation Oncology

## 2020-03-15 ENCOUNTER — Telehealth: Payer: Self-pay | Admitting: Cardiology

## 2020-03-15 DIAGNOSIS — C321 Malignant neoplasm of supraglottis: Secondary | ICD-10-CM

## 2020-03-15 DIAGNOSIS — R600 Localized edema: Secondary | ICD-10-CM

## 2020-03-15 DIAGNOSIS — Z9221 Personal history of antineoplastic chemotherapy: Secondary | ICD-10-CM

## 2020-03-15 DIAGNOSIS — C140 Malignant neoplasm of pharynx, unspecified: Secondary | ICD-10-CM

## 2020-03-15 DIAGNOSIS — Z79899 Other long term (current) drug therapy: Secondary | ICD-10-CM

## 2020-03-15 MED ORDER — SONAFINE EX EMUL
1.0000 "application " | Freq: Two times a day (BID) | CUTANEOUS | Status: DC
  Administered 2020-03-15: 1 via TOPICAL

## 2020-03-15 MED ORDER — FUROSEMIDE 40 MG PO TABS: 40.0000 mg | ORAL_TABLET | Freq: Every day | ORAL | 1 refills | Status: DC

## 2020-03-15 NOTE — Telephone Encounter (Signed)
Left the pt a message to call the office back so I could endorse recommendations per Dr. Meda Coffee.

## 2020-03-15 NOTE — Telephone Encounter (Signed)
Please arrange an early appointment this week in our clinic as well as echo with strain, please tell her I am sorry about her cancer

## 2020-03-15 NOTE — Telephone Encounter (Signed)
Spoke with the pt. She is aware of recommendations per Dr. Meda Coffee. Her echo is scheduled for this Thursday 12/23, and her labs are scheduled for 12/27.  Pt will see Dr. Meda Coffee in the office on 05/13/20 at [facility].  Pt is aware of how to take her new med, lasix 40 mg po daily. Pt verbalized understanding and agrees with this plan.  Pt was more than gracious for all the assistance provided.

## 2020-03-15 NOTE — Telephone Encounter (Signed)
Spoke with Dr. Meda Coffee on the phone about our office not having any open appt slots for the next 2 weeks. With that, Dr. Meda Coffee voiced new orders on this pt to have an echo done with strain, start the pt on lasix 40 mg po daily, and bring her in for labs on 12/27, to obtain a CMET and PRO-BNP.  She also advised for me to tell the pt that if she starts having sob and/or symptoms worsen, then she should go to the ER.  Also Dr. Meda Coffee advised to schedule her to be seen in our clinic on 05/13/20 at [facility].  Went ahead and scheduled the pt to come back into the office and see Dr. Meda Coffee for 05/13/20 at [facility].  Went ahead and called in the pts lasix 40 mg po daily to her pharmacy on file.  Went ahead and scheduled her labs for 12/27, as advised by Dr. Meda Coffee.  Will go ahead and place echo with strain in the system and send a message to our echo scheduler to call the pt back sometime this afternoon, when she is home from chemo, to have this appt scheduled in the next week or two.  Will endorse all this information to the pt once I return a call back to her around noon, for she will be home from her chemo and oncology appt at that time.

## 2020-03-15 NOTE — Telephone Encounter (Signed)
Pt c/o swelling: STAT is pt has developed SOB within 24 hours  1) How much weight have you gained and in what time span? 3 pounds in 1 week   2) If swelling, where is the swelling located? Legs, abdomen,   3) Are you currently taking a fluid pill? no  4) Are you currently SOB? No. Pt does have throat cancer and gets radiation in her throat   5) Do you have a log of your daily weights (if so, list)?  03/08/20: 122.6 03/15/20: 125.2  6) Have you gained 3 pounds in a day or 5 pounds in a week?  3 pounds in a week   7) Have you traveled recently? No  Patient said she started radiation for her throat cancer 02/09/20. She said she tried to wear a ring last week but had a hard time taking it off. She had to take it off with windex. She wears a watch and has not noticed a difference in her wrist.  She was advised by her Oncologist to call Dr. Meda Coffee to get an appt

## 2020-03-15 NOTE — Telephone Encounter (Signed)
Pt called back returning Ivy's call   Best number  [phone]

## 2020-03-15 NOTE — Telephone Encounter (Signed)
-----   Message from Dorothy Spark, MD sent at 03/15/2020  9:29 AM EST ----- Please start her on lasix 40 mg PO daily and bring her on 12/27 for CMP and BNP, if her SOB gets worse, tell her to go to the ER

## 2020-03-15 NOTE — Telephone Encounter (Signed)
Pt is calling in, as she was advised to by her Oncologist, to let Dr. Meda Coffee know that since she started getting chemo and radiation for her throat cancer, she has noticed bilateral lower extremity edema, and edema to both hands and fingers. Pt states she has NO SOB, CP, DOE, dizziness, palpitations, pre-syncopal or syncopal episodes.  Pt reports she has no cough.  Pt states her weight did go up 3 lbs in a weeks time.  Pt states her Oncologist advised her to reach out to Dr. Meda Coffee about this, for she may need to advise on starting the pt on a diuretic.  Informed the pt that I will route this message to Dr. Meda Coffee to further review and advise on this matter, and I will follow-up with her shortly thereafter.  Pt verbalized understanding and agrees with this plan.  Pt states to call her back after noon, for she will be at chemo until then.

## 2020-03-16 ENCOUNTER — Ambulatory Visit
Admission: RE | Admit: 2020-03-16 | Discharge: 2020-03-16 | Disposition: A | Discharge status: Home / Self Care | Payer: Medicare Other | Source: Ambulatory Visit | Attending: Radiation Oncology | Admitting: Radiation Oncology

## 2020-03-16 DIAGNOSIS — C321 Malignant neoplasm of supraglottis: Secondary | ICD-10-CM | POA: Diagnosis not present

## 2020-03-17 ENCOUNTER — Inpatient Hospital Stay: Payer: Medicare Other

## 2020-03-17 ENCOUNTER — Ambulatory Visit
Admission: RE | Admit: 2020-03-17 | Discharge: 2020-03-17 | Disposition: A | Discharge status: Home / Self Care | Payer: Medicare Other | Source: Ambulatory Visit | Attending: Radiation Oncology | Admitting: Radiation Oncology

## 2020-03-17 ENCOUNTER — Encounter: Payer: Medicare Other | Admitting: Nutrition

## 2020-03-17 DIAGNOSIS — C321 Malignant neoplasm of supraglottis: Secondary | ICD-10-CM | POA: Diagnosis not present

## 2020-03-17 NOTE — Progress Notes (Signed)
Nutrition Follow-up:  Patient with supraglottis cancer, receiving radiation.    Met with patient following radiation treatment today.  Patient giving 5 cartons of glucerna 1.5 daily (1 carton, 5 times per day).  MD reduced water flush back to 1m before and after each feeding.  Patient reports bowel movement this am, normal.  Tells RD that she was with her daughter all day Saturday and found out last night that daughter tested positive for strep throat.  She is unsure what to do.    Patient eating soups and crackers with cheese during the day.  Drinking liquids as well orally.  Patient has started lasix per PCP and planning echo.  Medications: reviewed  Labs: reviewed  Anthropometrics:   Weight 129 lb today in RD office stable from last week   NUTRITION DIAGNOSIS: Inadequate oral intake but nutrition being addressed with feeding tube   INTERVENTION:  Patient to continue with glucerna 1.5, 1 carton 5 times per day.  Flush with 669mof water before and after each feeding. Further fluid restrictions per MD.  Tube feeding providing 1780 calories, 98 g protein, 1500 ml free water (formula and flush). Message sent to MD SqIsidore Moosbout recent strep exposure with daughter. Nursing to follow-up regarding next steps.      MONITORING, EVALUATION, GOAL: weight trends, intake, tube feeding   NEXT VISIT: Dec 28   Wendall Isabell B. AlZenia ResidesRDFredericksburgLDFranklin Parkegistered Dietitian [phone])

## 2020-03-18 ENCOUNTER — Ambulatory Visit (HOSPITAL_COMMUNITY): Payer: Medicare Other | Attending: Internal Medicine

## 2020-03-18 ENCOUNTER — Ambulatory Visit
Admission: RE | Admit: 2020-03-18 | Discharge: 2020-03-18 | Disposition: A | Discharge status: Home / Self Care | Payer: Medicare Other | Source: Ambulatory Visit | Attending: Radiation Oncology | Admitting: Radiation Oncology

## 2020-03-18 ENCOUNTER — Other Ambulatory Visit: Payer: Self-pay

## 2020-03-18 DIAGNOSIS — Z9221 Personal history of antineoplastic chemotherapy: Secondary | ICD-10-CM | POA: Diagnosis present

## 2020-03-18 DIAGNOSIS — R600 Localized edema: Secondary | ICD-10-CM | POA: Insufficient documentation

## 2020-03-18 DIAGNOSIS — C140 Malignant neoplasm of pharynx, unspecified: Secondary | ICD-10-CM | POA: Insufficient documentation

## 2020-03-18 DIAGNOSIS — C321 Malignant neoplasm of supraglottis: Secondary | ICD-10-CM | POA: Diagnosis not present

## 2020-03-18 LAB — ECHOCARDIOGRAM COMPLETE
Area-P 1/2: 2.68 cm2
S' Lateral: 1.6 cm

## 2020-03-22 ENCOUNTER — Other Ambulatory Visit: Payer: Medicare Other | Admitting: *Deleted

## 2020-03-22 ENCOUNTER — Other Ambulatory Visit: Payer: Self-pay

## 2020-03-22 ENCOUNTER — Ambulatory Visit
Admission: RE | Admit: 2020-03-22 | Discharge: 2020-03-22 | Disposition: A | Discharge status: Home / Self Care | Payer: Medicare Other | Source: Ambulatory Visit | Attending: Radiation Oncology | Admitting: Radiation Oncology

## 2020-03-22 DIAGNOSIS — Z79899 Other long term (current) drug therapy: Secondary | ICD-10-CM

## 2020-03-22 DIAGNOSIS — C321 Malignant neoplasm of supraglottis: Secondary | ICD-10-CM | POA: Diagnosis not present

## 2020-03-22 DIAGNOSIS — R600 Localized edema: Secondary | ICD-10-CM

## 2020-03-22 DIAGNOSIS — C140 Malignant neoplasm of pharynx, unspecified: Secondary | ICD-10-CM

## 2020-03-22 DIAGNOSIS — Z9221 Personal history of antineoplastic chemotherapy: Secondary | ICD-10-CM

## 2020-03-23 ENCOUNTER — Inpatient Hospital Stay: Payer: Medicare Other

## 2020-03-23 ENCOUNTER — Ambulatory Visit
Admission: RE | Admit: 2020-03-23 | Discharge: 2020-03-23 | Disposition: A | Discharge status: Home / Self Care | Payer: Medicare Other | Source: Ambulatory Visit | Attending: Radiation Oncology | Admitting: Radiation Oncology

## 2020-03-23 DIAGNOSIS — C321 Malignant neoplasm of supraglottis: Secondary | ICD-10-CM

## 2020-03-23 LAB — COMPREHENSIVE METABOLIC PANEL
ALT: 14 IU/L (ref 0–32)
AST: 15 IU/L (ref 0–40)
Albumin/Globulin Ratio: 1.7 (ref 1.2–2.2)
Albumin: 3.9 g/dL (ref 3.6–4.6)
Alkaline Phosphatase: 74 IU/L (ref 44–121)
BUN/Creatinine Ratio: 39 — ABNORMAL HIGH (ref 12–28)
BUN: 42 mg/dL — ABNORMAL HIGH (ref 8–27)
Bilirubin Total: 0.3 mg/dL (ref 0.0–1.2)
CO2: 30 mmol/L — ABNORMAL HIGH (ref 20–29)
Calcium: 9.1 mg/dL (ref 8.7–10.3)
Chloride: 93 mmol/L — ABNORMAL LOW (ref 96–106)
Creatinine, Ser: 1.08 mg/dL — ABNORMAL HIGH (ref 0.57–1.00)
GFR calc Af Amer: 56 mL/min/{1.73_m2} — ABNORMAL LOW (ref >59)
GFR calc non Af Amer: 48 mL/min/{1.73_m2} — ABNORMAL LOW (ref >59)
Globulin, Total: 2.3 g/dL (ref 1.5–4.5)
Glucose: 295 mg/dL — ABNORMAL HIGH (ref 65–99)
Potassium: 4.4 mmol/L (ref 3.5–5.2)
Sodium: 137 mmol/L (ref 134–144)
Total Protein: 6.2 g/dL (ref 6.0–8.5)

## 2020-03-23 LAB — PRO B NATRIURETIC PEPTIDE: NT-Pro BNP: 313 pg/mL (ref 0–738)

## 2020-03-23 MED ORDER — SONAFINE EX EMUL
1.0000 "application " | Freq: Two times a day (BID) | CUTANEOUS | Status: DC
  Administered 2020-03-23: 1 via TOPICAL

## 2020-03-23 NOTE — Progress Notes (Signed)
Nutrition Follow-up:  Patient with superglottis cancer, receiving radiation  Met with patient following radiation today. Patient can hardly speak. Reports that feeding tube was clogged this am.  Was able to use warm water and unclog tube.  Drank glucerna 1.5 feeding.  Continues using 5 cartons of glucerna 1.5 daily.  Met with PCP yesterday, continues on lasix.  Flushes with 58m of water before and after each feeding. Drinking fluids orally and eating soft foods (soups). Reports that strep and covid tests were negative last week. Having bowel movement daily.    Medications: reviewed  Labs: reviewed  Anthropometrics:   Weight 126 lb 8 oz today in RD's office (?? Fluid weight loss)  129 lb on 12/22 126 on 12/2 127 lb on 11/15   Estimated Energy Needs  Kcals: 1700-1900 Protein: 85-95 g Fluid: 1.7 L  NUTRITION DIAGNOSIS: Inadequate oral intake continues but relying on feeding tube for nutrition   INTERVENTION:  Continue glucerna 1.5, 5 cartons per day and 666mwater flush before and after each feeding. Reviewed ways to unclog feeding tube with warm water.      MONITORING, EVALUATION, GOAL: weight trends, intake, tube feeding   NEXT VISIT: Tuesday, Jan 4 after radiation  Anita Riveron B. AlZenia ResidesRDSnyderLDNew Siteegistered Dietitian [phone])

## 2020-03-24 ENCOUNTER — Ambulatory Visit
Admission: RE | Admit: 2020-03-24 | Discharge: 2020-03-24 | Disposition: A | Discharge status: Home / Self Care | Payer: Medicare Other | Source: Ambulatory Visit | Attending: Radiation Oncology | Admitting: Radiation Oncology

## 2020-03-24 ENCOUNTER — Encounter: Payer: Medicare Other | Admitting: Nutrition

## 2020-03-24 DIAGNOSIS — C321 Malignant neoplasm of supraglottis: Secondary | ICD-10-CM | POA: Diagnosis not present

## 2020-03-25 ENCOUNTER — Ambulatory Visit
Admission: RE | Admit: 2020-03-25 | Discharge: 2020-03-25 | Disposition: A | Discharge status: Home / Self Care | Payer: Medicare Other | Source: Ambulatory Visit | Attending: Radiation Oncology | Admitting: Radiation Oncology

## 2020-03-25 DIAGNOSIS — C321 Malignant neoplasm of supraglottis: Secondary | ICD-10-CM | POA: Diagnosis not present

## 2020-03-29 ENCOUNTER — Ambulatory Visit
Admission: RE | Admit: 2020-03-29 | Discharge: 2020-03-29 | Disposition: A | Discharge status: Home / Self Care | Payer: Medicare Other | Source: Ambulatory Visit | Attending: Radiation Oncology | Admitting: Radiation Oncology

## 2020-03-29 ENCOUNTER — Other Ambulatory Visit: Payer: Self-pay | Admitting: Radiation Oncology

## 2020-03-29 DIAGNOSIS — C321 Malignant neoplasm of supraglottis: Secondary | ICD-10-CM | POA: Diagnosis present

## 2020-03-29 MED ORDER — HYDROCODONE-ACETAMINOPHEN 7.5-325 MG/15ML PO SOLN: 10.0000 mL | Freq: Four times a day (QID) | ORAL | 0 refills | Status: DC | PRN

## 2020-03-29 MED ORDER — SONAFINE EX EMUL
1.0000 "application " | Freq: Two times a day (BID) | CUTANEOUS | Status: DC
  Administered 2020-03-29: 1 via TOPICAL

## 2020-03-30 ENCOUNTER — Inpatient Hospital Stay: Payer: Medicare Other | Attending: Radiation Oncology

## 2020-03-30 ENCOUNTER — Other Ambulatory Visit: Payer: Self-pay

## 2020-03-30 ENCOUNTER — Ambulatory Visit
Admission: RE | Admit: 2020-03-30 | Discharge: 2020-03-30 | Disposition: A | Discharge status: Home / Self Care | Payer: Medicare Other | Source: Ambulatory Visit | Attending: Radiation Oncology | Admitting: Radiation Oncology

## 2020-03-30 DIAGNOSIS — C321 Malignant neoplasm of supraglottis: Secondary | ICD-10-CM | POA: Diagnosis not present

## 2020-03-30 NOTE — Progress Notes (Signed)
Nutrition Follow-up:  Patient with superglottis cancer, receiving radiation. Last treatment tomorrow 1/5.    Met with patient in clinic after treatment. Patient continues to give 5 cartons of glucerna 1.5.  Flushes with 29m of water before and after each feeding.  Continues to eat soups, chocolate, macaroni and cheese.  Denies problems with tube feeding.  Reports dry mouth, no taste and soreness with swallowing.   Received new shipment of tube feeding this past week.      Medications: reviewed  Labs: reviewed  Anthropometrics:   Weight 127 lb today in RD clinic, stable  126 lb 8 oz on 12/28 129 lb on 12/22 126 lb on 12/2 127 lb on 11/15  Estimated Energy Needs  Kcals: 1700-1900 Protein: 85-95 g Fluid: 1.7 L  NUTRITION DIAGNOSIS: Inadequate oral intake continues but relying on feeding tube for nutrition   INTERVENTION:  Continue glucerna 1.5, 5 cartons per day via feeding tube. Flush 631mbefore and after each feeding.   Patient to continue to nibble on soft foods as able.      MONITORING, EVALUATION, GOAL: weight trends, intake, tube feeding   NEXT VISIT: Wednesday, Jan 26 after MD appt  Koray Soter B. AlZenia ResidesRDLoma Linda EastLDEndicottegistered Dietitian [phone])

## 2020-03-31 ENCOUNTER — Ambulatory Visit
Admission: RE | Admit: 2020-03-31 | Discharge: 2020-03-31 | Disposition: A | Discharge status: Home / Self Care | Payer: Medicare Other | Source: Ambulatory Visit | Attending: Radiation Oncology | Admitting: Radiation Oncology

## 2020-03-31 ENCOUNTER — Encounter: Payer: Self-pay | Admitting: Radiation Oncology

## 2020-03-31 ENCOUNTER — Ambulatory Visit: Payer: Medicare Other

## 2020-03-31 DIAGNOSIS — C321 Malignant neoplasm of supraglottis: Secondary | ICD-10-CM | POA: Diagnosis not present

## 2020-04-01 ENCOUNTER — Ambulatory Visit: Payer: Medicare Other

## 2020-04-05 ENCOUNTER — Other Ambulatory Visit (HOSPITAL_COMMUNITY): Payer: Medicare Other

## 2020-04-08 ENCOUNTER — Ambulatory Visit: Payer: Medicare Other | Attending: Radiation Oncology | Admitting: Physical Therapy

## 2020-04-08 ENCOUNTER — Encounter: Payer: Self-pay | Admitting: Physical Therapy

## 2020-04-08 ENCOUNTER — Other Ambulatory Visit: Payer: Self-pay

## 2020-04-08 DIAGNOSIS — C321 Malignant neoplasm of supraglottis: Secondary | ICD-10-CM | POA: Diagnosis present

## 2020-04-08 DIAGNOSIS — R293 Abnormal posture: Secondary | ICD-10-CM | POA: Diagnosis present

## 2020-04-08 NOTE — Therapy (Signed)
[place], [place], [address] Phone: [phone]   Fax:  [phone]  Physical Therapy Treatment  Patient Details  Name: Anita Pollard MRN: [id number] Date of Birth: [birth year] Referring Provider (PT): Reita May Date: 04/08/2020   PT End of Session - 04/08/20 1429    Visit Number 2    Number of Visits 2    Date for PT Re-Evaluation 04/08/20    PT Start Time 1405    PT Stop Time 1429    PT Time Calculation (min) 24 min    Activity Tolerance Patient tolerated treatment well    Behavior During Therapy [facility] for tasks assessed/performed           Past Medical History:  Diagnosis Date  . Alcohol abuse   . Anemia   . Arthritis   . Breast cancer ([place]) 2010   Right  . Chronic kidney disease   . Colon polyp   . Depression   . Diabetes mellitus   . GERD (gastroesophageal reflux disease)   . Heart murmur   . History of kidney stones 1976  . HTN (hypertension)   . Hypothyroidism   . Osteopenia   . Pancreatitis   . Ulcerative esophagitis     Past Surgical History:  Procedure Laterality Date  . ABDOMINAL HYSTERECTOMY    . ABDOMINOPLASTY    . APPENDECTOMY    . BREAST BIOPSY Right 08/03/2008   Stereo Bx, Malignant  . BREAST LUMPECTOMY Right 08/26/2008  . CATARACT EXTRACTION W/ INTRAOCULAR LENS  IMPLANT, BILATERAL    . CO2 LASER APPLICATION N/A [id number]/58/8502   Procedure: CO2 LASER APPLICATION;  Surgeon: Melida Quitter, MD;  Location: [place];  Service: ENT;  Laterality: N/A;  . COSMETIC SURGERY    . ductal carcinoma     right breast   . EYE SURGERY    . HIP ARTHROPLASTY Right   . INSERTION OF DIALYSIS CATHETER Right 09/25/2012   Procedure: INSERTION OF DIALYSIS CATHETER;  Surgeon: Mal Misty, MD;  Location: [place];  Service: Vascular;  Laterality: Right;  Righ Internal Jugular Placement  . IR GASTROSTOMY TUBE MOD SED  02/10/2020  . JOINT REPLACEMENT    . KIDNEY TRANSPLANT Bilateral  09/30/2013  . MICROLARYNGOSCOPY N/A 10/20/2019   Procedure: Suspended Microlaryngoscopy with Biopsy;  Surgeon: Melida Quitter, MD;  Location: [facility] OR;  Service: ENT;  Laterality: N/A;  . MICROLARYNGOSCOPY N/A 01/09/2020   Procedure: MICROLARYNGOSCOPY WITH BIOPSY;  Surgeon: Melida Quitter, MD;  Location: [place];  Service: ENT;  Laterality: N/A;  . RIGID ESOPHAGOSCOPY N/A 10/20/2019   Procedure: RIGID ESOPHAGOSCOPY;  Surgeon: Melida Quitter, MD;  Location: [place];  Service: ENT;  Laterality: N/A;  . THYROIDECTOMY    . TUMMY TUCK      There were no vitals filed for this visit.   Subjective Assessment - 04/08/20 1406    Subjective I have been doing all the exercises you gave me and I am not having trouble with my neck ROM. I have not noticed any swelling. I am having trouble talking. I am still getting PT for my shoulder.    Pertinent History malignant neoplasm of supraglottis, stage unknown, ongoing R sided throat pain since Feb; 10/20/19- microdirect laryngoscopy with biopsy revealed squamous mucosa with midl to moderable dysplasia; 01/09/20- second microdirect laryngoscopy and pathology revealed SCC; began radiation 02/09/20 to larynx and bilateral neck and will complete on 04/01/20; 02/10/20- PEG placed due to poor intake and difficulty  swallowing ; hx of R breast cancer; bilateral kidney transplant on 09/2013    Patient Stated Goals to gain info from providers    Currently in Pain? Yes    Pain Score 6     Pain Location Throat    Pain Orientation Anterior    Pain Descriptors / Indicators Sore;Aching    Pain Type Chronic pain    Pain Onset 1 to 4 weeks ago    Pain Frequency Constant    Aggravating Factors  swallowing, taking meds, eating    Pain Relieving Factors drinking cold thickened liquids    Effect of Pain on Daily Activities hard to swallow, hard to talk              [facility] PT Assessment - 04/08/20 0001      Functional Tests   Functional tests Sit to Stand      Sit to Stand   Comments  30 sec sit to stand: 17 reps which is excellent for her age      Posture/Postural Control   Posture/Postural Control Postural limitations    Postural Limitations Rounded Shoulders;Forward head      AROM   Cervical Flexion WFL    Cervical Extension WFL    Cervical - Right Side Bend WFL    Cervical - Left Side Bend WFL    Cervical - Right Rotation WFL    Cervical - Left Rotation WFL             LYMPHEDEMA/ONCOLOGY QUESTIONNAIRE - 04/08/20 0001      Head and Neck   4 cm superior to sternal notch around neck 32.6 cm    6 cm superior to sternal notch around neck 31.6 cm    8 cm superior to sternal notch around neck 32.7 cm                                   PT Long Term Goals - 04/08/20 1434      PT LONG TERM GOAL #1   Title Pt will return to baseline measurements and not demonstrate any signs of lymphedmea to allow pt to return to prior level of function    Time 8    Period Weeks    Status Achieved                 Plan - 04/08/20 1430    Clinical Impression Statement Pt returns to PT after completing radiation for treatment of supraglottic cancer. She has been compliant with her home exercise program. Her cervical ROM is still WFL. She is currently receiving therapy for left shoulder ROM due to rotator cuff issues at a seperate clinic. Remeasured neck circumferences and these have decreased since last session so there are no signs of lymphedema. Pt was able to complete 2 additional sit to stands today during 30 sec sit to stand which was 17 reps which is excellent for her age. Educated pt that at any point if she notices any swelling in her anterior neck to get a referral to PT to address lymphedema. Pt has no current needs for PT at this time and will be discharged from skilled PT services.    PT Frequency --   eval and 1 f/u   PT Duration 8 weeks    PT Treatment/Interventions ADLs/Self Care Home Management;Patient/family education;Therapeutic  exercise    PT Next Visit Plan d/c today    PT  Home Exercise Plan head and neck ROM exercises    Consulted and Agree with Plan of Care Patient           Patient will benefit from skilled therapeutic intervention in order to improve the following deficits and impairments:     Visit Diagnosis: Abnormal posture  Malignant neoplasm of supraglottis [facility])     Problem List Patient Active Problem List   Diagnosis Date Noted  . Malignant neoplasm of supraglottis ([address]) 01/21/2020  . Pelvic mass in female 06/05/2013  . DM2 (diabetes mellitus, type 2) ([place]) 09/24/2012  . AKI (acute kidney injury) ([place]) 09/24/2012  . Elevated serum creatinine 09/03/2012  . Anemia, unspecified 09/03/2012  . Breast cancer ([place]) 08/31/2011  . HYPERTENSION, BENIGN 02/28/2010  . Carotid artery disease ([place]) 01/26/2010    Allyson Sabal [facility] 04/08/2020, 2:34 PM  [place], [place], [address] Phone: [phone]   Fax:  [phone]  Name: SHAYANN GARBUTT MRN: [id number] Date of Birth: May 17, 1940  PHYSICAL THERAPY DISCHARGE SUMMARY  Visits from Start of Care: 2  Current functional level related to goals / functional outcomes: All goals met   Remaining deficits: None   Education / Equipment: HEP  Plan: Patient agrees to discharge.  Patient goals were met. Patient is being discharged due to meeting the stated rehab goals.  ?????    Allyson Sabal [place], [place] 04/08/20 2:34 PM

## 2020-04-15 ENCOUNTER — Ambulatory Visit: Payer: Medicare Other

## 2020-04-20 NOTE — Progress Notes (Signed)
Anita Pollard presents today for 2 week follow-up after completing radiation to her supraglottis on 03/31/2020  Pain issues, if any: Reports minor lingering throat discomfort. Reports it has greatly improved though since completing radiation Using a feeding tube?: Yes--she currently only does 1 feeding a day. She reports the feedings seem to cause diarrhea/GI upset, so she has begun weaning herself off. She feels she is eating/drinking enough by mouth that she would like to have PEG removed Weight changes, if any:  Wt Readings from Last 3 Encounters:  04/21/20 127 lb (57.6 kg)  03/30/20 127 lb (57.6 kg)  03/10/20 129 lb 6 oz (58.7 kg)   Swallowing issues, if any: Patient denies, reports she eats "anything I want if I can taste it". She still uses a thickener with all her beverages to help thin liquids go down easier. Smoking or chewing tobacco? None Using fluoride trays daily? N/A--sees her dentist regularly and denies any mouth sores Last ENT visit was on: Not since diagnosis Other notable issues, if any: Discharged from PT after last visit do to excellent compliance with exercises and no signs of lymphedema. Skin in treatment field appears well healed. Continues to deal with dry mouth but states her sense of taste is slowly returning. Overall she reports she feels much improved being 2 weeks out from completing treatment.   Vitals:   04/21/20 1104  BP: (!) 163/70  Pulse: 79  Resp: 18  Temp: (!) 97.5 F (36.4 C)  SpO2: 98%

## 2020-04-21 ENCOUNTER — Other Ambulatory Visit: Payer: Self-pay

## 2020-04-21 ENCOUNTER — Inpatient Hospital Stay: Payer: Medicare Other

## 2020-04-21 ENCOUNTER — Ambulatory Visit
Admission: RE | Admit: 2020-04-21 | Discharge: 2020-04-21 | Disposition: A | Discharge status: Home / Self Care | Payer: Medicare Other | Source: Ambulatory Visit | Attending: Radiation Oncology | Admitting: Radiation Oncology

## 2020-04-21 VITALS — BP 163/70 | HR 79 | Temp 97.5°F | Resp 18 | Ht 61.0 in | Wt 127.0 lb

## 2020-04-21 DIAGNOSIS — Z7984 Long term (current) use of oral hypoglycemic drugs: Secondary | ICD-10-CM | POA: Diagnosis not present

## 2020-04-21 DIAGNOSIS — Z79899 Other long term (current) drug therapy: Secondary | ICD-10-CM | POA: Diagnosis not present

## 2020-04-21 DIAGNOSIS — C321 Malignant neoplasm of supraglottis: Secondary | ICD-10-CM | POA: Insufficient documentation

## 2020-04-21 DIAGNOSIS — R682 Dry mouth, unspecified: Secondary | ICD-10-CM | POA: Insufficient documentation

## 2020-04-21 DIAGNOSIS — Z923 Personal history of irradiation: Secondary | ICD-10-CM | POA: Insufficient documentation

## 2020-04-21 NOTE — Progress Notes (Signed)
Nutrition Follow-up:  Patient with superglottis cancer, receiving radiation.  Patient completed treatment on 1/5.  Met with patient in clinic after follow-up with Dr Isidore Moos.  Patient reports that she has been giving only 1 carton of glucerna 1.5 via feeding tube in the am for the last week.  She has been eating solid foods, typically 3 meals per day.  Reports breakfast has been oatmeal or cereal with nuts and fruit.  Lunch has been sandwich ([place] or ham and cheese).  Supper has been salmon or tuna or meatloaf with vegetable or macaroni and cheese.  Has also had breakfast for supper (eggs, bacon,toast). Denies trouble swallowing.  Reports taste is slowly coming back.  Has a taste for chocolate and has been eating chocolate candies.  Aware this could increase blood glucose.  Has been drinking a 30 g protein shake.      Medications: reviewed  Labs: reviewed  Anthropometrics:   Weight stable at 127 lb today   NUTRITION DIAGNOSIS: Inadequate oral intake improving   INTERVENTION:  Recommend stop giving 1 carton of glucerna via feeding tube. Flush tube daily with 9m of water to keep patent.   Discussed adding calories and protein to diet when remove carton of tube feeding.  Patient wants to have tube removed. Will weigh at home at least weekly and be aware of changes in weight.  Has RD contact information    MONITORING, EVALUATION, GOAL: weight trends, intake   NEXT VISIT: Feb 23, weight check  Myesha Stillion B. AZenia Resides RKramer LGibson FlatsRegistered Dietitian [phone](mobile)

## 2020-04-21 NOTE — Progress Notes (Signed)
Oncology Nurse Navigator Documentation  I met briefly with Anita Pollard after her follow up visit with Dr. Isidore Moos today. She is feeling well and pleased with her progress. I accompanied her to her appointment with the [facility]. She will return in about 3 months to receive results of her post-treatment PET scan. She knows to call me if she has any needs or concerns.   Harlow Asa RN, BSN, OCN Head & Neck Oncology Nurse [place] at [address] [facility] Phone # [phone]  Fax # [phone]

## 2020-04-23 ENCOUNTER — Encounter: Payer: Self-pay | Admitting: Radiation Oncology

## 2020-04-23 NOTE — Progress Notes (Signed)
  Patient Name: Anita Pollard MRN: [id number] DOB: 1940/05/26 Referring Physician: Redmond Baseman [name] (Profile Not Attached) Date of Service: 03/31/2020  [facility], [place]                                                        End Of Treatment Note  Diagnoses: C32.1-Malignant neoplasm of supraglottis  Cancer Staging: Cancer Staging Malignant neoplasm of supraglottis [facility]) Staging form: Larynx - Supraglottis, AJCC 8th Edition - Clinical stage from 01/20/2020: Stage Unknown (cT2, cNX, cM0) - Signed by Eppie Gibson, MD on 01/21/2020 Stage prefix: Initial diagnosis  Intent: Curative  Radiation Treatment Dates: 02/09/2020 through 03/31/2020 Site Technique Total Dose (Gy) Dose per Fx (Gy) Completed Fx Beam Energies  Larynx: HN_supragl IMRT 70/70 2 35/35 6X   Narrative: The patient tolerated radiation therapy relatively well.   Plan: The patient will follow-up with radiation oncology in 2-3 weeks .  -----------------------------------  Eppie Gibson, MD

## 2020-04-23 NOTE — Progress Notes (Signed)
Radiation Oncology         [phone]) [phone] ________________________________  Name: Anita Pollard MRN: [id number]  Date: 04/21/2020  DOB: Feb 09, 1941  Follow-Up Visit Note  CC: Anita Cruel, MD  Anita Quitter, MD  Diagnosis and Prior Radiotherapy:       ICD-10-CM   1. Malignant neoplasm of supraglottis [facility])  C32.1     Radiation Treatment Dates: 02/09/2020 through 03/31/2020 Site Technique Total Dose (Gy) Dose per Fx (Gy) Completed Fx Beam Energies  Larynx: HN_supragl IMRT 70/70 2 35/35 6X    CHIEF COMPLAINT:  Here for follow-up and surveillance of throat cancer  Narrative:  The patient returns today for routine follow-up.   [name] presents today for 2 week follow-up after completing radiation to her supraglottis on 03/31/2020  Pain issues, if any: Reports minor lingering throat discomfort. Reports it has greatly improved though since completing radiation Using a feeding tube?: Yes--she currently only does 1 feeding a day; she has begun weaning herself off. She feels she is eating/drinking enough by mouth that she would like to have PEG removed Weight changes, if any:  Wt Readings from Last 3 Encounters:  04/21/20 127 lb (57.6 kg)  03/30/20 127 lb (57.6 kg)  03/10/20 129 lb 6 oz (58.7 kg)   Swallowing issues, if any: Patient denies, reports she eats "anything I want if I can taste it". She still uses a thickener with all her beverages to help thin liquids go down easier. Smoking or chewing tobacco? None Using fluoride trays daily? N/A--sees her dentist regularly and denies any mouth sores Last ENT visit was on: Not since diagnosis Other notable issues, if any: Discharged from PT after last visit do to excellent compliance with exercises and no signs of lymphedema. Skin in treatment field appears well healed. Continues to deal with dry mouth but states her sense of taste is slowly returning. Overall she reports she feels much improved being 2 weeks out from completing  treatment.  She reports urgency of bowel movements.  This is a longstanding issue that preceded her throat cancer diagnosis.  Her bowel movements are usually well formed.  She is much less hoarse.  She denies any significant pain.  Vitals:   04/21/20 1104  BP: (!) 163/70  Pulse: 79  Resp: 18  Temp: (!) 97.5 F (36.4 C)  SpO2: 98%                       ALLERGIES:  is allergic to penicillins, banana, detrol [tolterodine], quetiapine, risperidone and related, and lisinopril.  Meds: Current Outpatient Medications  Medication Sig Dispense Refill  . amLODipine (NORVASC) 10 MG tablet Take 10 mg by mouth at bedtime.    . Cholecalciferol (VITAMIN D3) 5000 UNITS TABS Take 5,000 Units by mouth 3 (three) times daily after meals.     . furosemide (LASIX) 40 MG tablet Take 1 tablet (40 mg total) by mouth daily. 90 tablet 1  . glipiZIDE (GLUCOTROL XL) 2.5 MG 24 hr tablet Take 5 mg by mouth 2 (two) times daily.     Marland Kitchen HYDROcodone-acetaminophen (HYCET) 7.5-325 mg/15 ml solution Take 10-15 mLs by mouth 4 (four) times daily as needed for moderate pain. 473 mL 0  . Lancets (FREESTYLE) lancets by Does not apply route.    Marland Kitchen levothyroxine (SYNTHROID) 200 MCG tablet Take 200 mcg by mouth daily before breakfast.     . lidocaine (XYLOCAINE) 2 % solution Patient: Mix 1part 2% viscous lidocaine, 1part H20.  Swish & swallow 13mL of diluted mixture, 40min before meals and at bedtime, up to QID 200 mL 4  . losartan (COZAAR) 25 MG tablet Take 25 mg by mouth at bedtime.     . magnesium oxide (MAGOX 400) 400 (241.3 MG) MG tablet Take 400 mg by mouth 3 (three) times daily.     . Melatonin 5 MG CAPS Take 5 mg by mouth See admin instructions. Take 5 mg 2 hours before bedtime    . Misc Natural Products (GLUCOS-CHONDROIT-MSM COMPLEX PO) Take 1 tablet by mouth 3 (three) times daily.    . Multiple Vitamin (MULTIVITAMIN) tablet Take 1 tablet by mouth daily.    . mycophenolate (MYFORTIC) 180 MG EC tablet Take 180 mg by mouth  every 12 (twelve) hours.     . Nutritional Supplements (FEEDING SUPPLEMENT, GLUCERNA 1.5 CAL,) LIQD Give 1 carton 5 times per day via feeding tube (7 am, 10am, 1pm, 4pm, 7pm). Flush with 53ml of water before and after each feeding.  Meets 100% of calorie needs. 1185 mL 2  . omeprazole (PRILOSEC) 20 MG capsule Take 20 mg by mouth daily.    . ondansetron (ZOFRAN) 4 MG tablet Take 4 mg by mouth every 8 (eight) hours as needed for nausea or vomiting.     . Pancrelipase, Lip-Prot-Amyl, (CREON) 3000-9500 units CPEP Take 1 capsule by mouth 3 (three) times daily with meals.     Vladimir Faster Glycol-Propyl Glycol (SYSTANE OP) Place 1 drop into both eyes every evening.    . potassium chloride (MICRO-K) 10 MEQ CR capsule Take 10-20 mEq by mouth See admin instructions. Take 20 meq by mouth daily on Sunday, Tuesday, Thursday and Saturday, take 10 meq on Monday, Wednesday and Friday    . pramipexole (MIRAPEX) 0.25 MG tablet Take 0.25-0.5 mg by mouth See admin instructions. Take 0.5 mg 2 hours prior to bedtime then 0.25 mg at bedtime    . predniSONE (DELTASONE) 10 MG tablet Take 10 mg by mouth daily with breakfast.     . Probiotic Product (CULTURELLE PROBIOTICS PO) Take 1 capsule by mouth at bedtime.    . raloxifene (EVISTA) 60 MG tablet Take 60 mg by mouth every evening.     . rosuvastatin (CRESTOR) 5 MG tablet Take 1 tablet (5 mg total) by mouth 2 (two) times a week. (Patient taking differently: Take 5 mg by mouth 2 (two) times a week. Mondays and Fridays) 45 tablet 3  . sulfamethoxazole-trimethoprim (BACTRIM,SEPTRA) 400-80 MG tablet Take 1 tablet by mouth every Monday, Wednesday, and Friday.     . tacrolimus (PROGRAF) 0.5 MG capsule Take 0.5 mg by mouth every 12 (twelve) hours.     . tacrolimus (PROGRAF) 1 MG capsule Take 1 mg by mouth every 12 (twelve) hours.     . temazepam (RESTORIL) 15 MG capsule Take 15-30 mg by mouth at bedtime.     . Turmeric (QC TUMERIC COMPLEX PO) Take 750 mg by mouth daily as needed  (joint pain).     . vitamin B-12 (CYANOCOBALAMIN) 500 MCG tablet Take 500 mcg by mouth daily.     No current facility-administered medications for this encounter.    Physical Findings: The patient is in no acute distress. Patient is alert and oriented. Wt Readings from Last 3 Encounters:  04/21/20 127 lb (57.6 kg)  03/30/20 127 lb (57.6 kg)  03/10/20 129 lb 6 oz (58.7 kg)    height is 5\' 1"  (1.549 m) and weight is 127 lb (57.6 kg). Her  temporal temperature is 97.5 F (36.4 C) (abnormal). Her blood pressure is 163/70 (abnormal) and her pulse is 79. Her respiration is 18 and oxygen saturation is 98%. .  General: Alert and oriented, in no acute distress; hoarseness has decreased substantially HEENT: Head is normocephalic. Extraocular movements are intact. Oropharynx is notable for no lesions.  No oral cavity lesions. Neck: Neck is notable for no palpable adenopathy. Skin: Skin in treatment fields shows satisfactory healing in treatment fields. Psychiatric: Judgment and insight are intact. Affect is appropriate.   Lab Findings: Lab Results  Component Value Date   WBC 8.4 02/10/2020   HGB 12.1 02/10/2020   HCT 37.6 02/10/2020   MCV 98.4 02/10/2020   PLT 207 02/10/2020    Lab Results  Component Value Date   TSH 0.402 01/30/2020    Radiographic Findings: No results found.  Impression/Plan:    1) Head and Neck Cancer Status: Healing well from radiotherapy  2) Nutritional Status: Weight has stabilized.  She is using her PEG tube much less.  I advised her to continue to wean off of the tube to as tolerated.  When she has not used her feeding tube for a full month and is maintaining her weight, this can be removed  3) Risk Factors: The patient has been educated about risk factors including alcohol and tobacco abuse; they understand that avoidance of alcohol and tobacco is important to prevent recurrences as well as other cancers  4) Swallowing: Satisfactory function.  Continue  swallowing exercises.  Continue using thickener as needed for liquids  5) Dental: Encouraged to continue regular followup with dentistry, and dental hygiene including fluoride rinses.   6) Thyroid function: Check annually Lab Results  Component Value Date   TSH 0.402 01/30/2020    7)   Follow-up in about 3 months with restaging PET scan. The patient was encouraged to call with any issues or questions before then.  On date of service, in total, I spent 20 minutes on this encounter. Patient was seen in person. _____________________________________   Eppie Gibson, MD

## 2020-04-27 ENCOUNTER — Ambulatory Visit: Payer: Medicare Other | Attending: Radiation Oncology

## 2020-04-27 ENCOUNTER — Other Ambulatory Visit: Payer: Self-pay

## 2020-04-27 DIAGNOSIS — R1310 Dysphagia, unspecified: Secondary | ICD-10-CM | POA: Insufficient documentation

## 2020-04-27 NOTE — Therapy (Signed)
[facility] [address] [place], [place], [address] Phone: [phone]   Fax:  [phone]  Speech Language Pathology Treatment  Patient Details  Name: Anita Pollard MRN: [id number] Date of Birth: 12-20-1940 Referring Provider (SLP): Eppie Gibson, MD   Encounter Date: 04/27/2020   End of Session - 04/27/20 2348    Visit Number 3    Number of Visits 7    Date for SLP Re-Evaluation 05/12/20   90 days   SLP Start Time 9924    SLP Stop Time  1059    SLP Time Calculation (min) 38 min    Activity Tolerance Patient tolerated treatment well           Past Medical History:  Diagnosis Date  . Alcohol abuse   . Anemia   . Arthritis   . Breast cancer ([facility]) 2010   Right  . Chronic kidney disease   . Colon polyp   . Depression   . Diabetes mellitus   . GERD (gastroesophageal reflux disease)   . Heart murmur   . History of kidney stones 1976  . HTN (hypertension)   . Hypothyroidism   . Osteopenia   . Pancreatitis   . Ulcerative esophagitis     Past Surgical History:  Procedure Laterality Date  . ABDOMINAL HYSTERECTOMY    . ABDOMINOPLASTY    . APPENDECTOMY    . BREAST BIOPSY Right 08/03/2008   Stereo Bx, Malignant  . BREAST LUMPECTOMY Right 08/26/2008  . CATARACT EXTRACTION W/ INTRAOCULAR LENS  IMPLANT, BILATERAL    . CO2 LASER APPLICATION N/A 26/83/4196   Procedure: CO2 LASER APPLICATION;  Surgeon: Melida Quitter, MD;  Location: [facility];  Service: ENT;  Laterality: N/A;  . COSMETIC SURGERY    . ductal carcinoma     right breast   . EYE SURGERY    . HIP ARTHROPLASTY Right   . INSERTION OF DIALYSIS CATHETER Right 09/25/2012   Procedure: INSERTION OF DIALYSIS CATHETER;  Surgeon: Mal Misty, MD;  Location: [facility];  Service: Vascular;  Laterality: Right;  Righ Internal Jugular Placement  . IR GASTROSTOMY TUBE MOD SED  02/10/2020  . JOINT REPLACEMENT    . 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  MICROLARYNGOSCOPY N/A 10/20/2019   Procedure: Suspended Microlaryngoscopy with Biopsy;  Surgeon: Melida Quitter, MD;  Location: [facility] OR;  Service: ENT;  Laterality: N/A;  . MICROLARYNGOSCOPY N/A 01/09/2020   Procedure: MICROLARYNGOSCOPY WITH BIOPSY;  Surgeon: Melida Quitter, MD;  Location: [facility];  Service: ENT;  Laterality: N/A;  . RIGID ESOPHAGOSCOPY N/A 10/20/2019   Procedure: RIGID ESOPHAGOSCOPY;  Surgeon: Melida Quitter, MD;  Location: [facility];  Service: ENT;  Laterality: N/A;  . THYROIDECTOMY    . TUMMY TUCK      There were no vitals filed for this visit.   Subjective Assessment - 04/27/20 1021    Subjective Pt has been eating softer foods amidst some regular: meatloaf, salmon, [employer] cereal. Pt enters with nectar liquids today.    Currently in Pain? No/denies                 ADULT SLP TREATMENT - 04/27/20 1030      General Information   Behavior/Cognition Alert;Cooperative;Pleasant mood      Treatment Provided   Treatment provided Dysphagia      Dysphagia Treatment   Temperature Spikes Noted No    Respiratory Status Room air    Treatment Methods Skilled observation;Therapeutic exercise;Patient/caregiver education  Patient observed directly with PO's Yes    Type of PO's observed Dysphagia 3 (soft);Thin liquids    Liquids provided via Cup    Oral Phase Signs & Symptoms Other (comment)   none noted   Pharyngeal Phase Signs & Symptoms Other (comment)   none noted with smaller sips ("That one filled my mouth" sip size resulted in throat clearing for 1-2 inutes after ingestion)   Other treatment/comments SLP trialed pt with regular water instead of nectar. With a large sip pt had throat clearing, but with smaller sips and WNL bite sizes pt wihtout overt s/sx pharygneal difficulties.SLP discussed/educated pt on how to make pill administration easier for pharyngeal clearance as pt expressed fear with this - pt still using liquid meds except for multivitamin which she has not taken  since late October 2021. SLP told pt to drink nectar with meals but all other instances have thin water/regular liquids with smaller sips.With HEP pt was independent and reported performing as prescribed by SLP. Pt told SLP 3 overt s/sx aspiration PNA.      Assessment / Recommendations / Plan   Plan Continue with current plan of care      Dysphagia Recommendations   Diet recommendations Thin liquid   solids as tolerated; nectar with meals and thin otherwise   Liquids provided via Cup    Compensations Slow rate;Small sips/bites      Progression Toward Goals   Progression toward goals Progressing toward goals            SLP Education - 04/27/20 2347    Education Details use nectar with meals only, liquids with smaller sips, overt s/sx aspiration PNA    Person(s) Educated Patient    Methods Explanation;Verbal cues    Comprehension Verbalized understanding;Returned demonstration;Verbal cues required;Need further instruction            SLP Short Term Goals - 04/27/20 2350      SLP SHORT TERM GOAL #1   Title Pt will tell SLP rationale for HEP completion    Period --   visit, for all STGs   Status Achieved      SLP SHORT TERM GOAL #2   Title Pt will complete HEP with occasional min A over two sessions    Baseline 03-11-20    Status Achieved      SLP SHORT TERM GOAL #3   Title pt will tell SLP 3 overt s/sx aspiration PNA    Status Achieved      SLP SHORT TERM GOAL #4   Title Pt will tell SLP how a food journal can hasten/facilitate return to more normalized diet    Status Deferred            SLP Long Term Goals - 04/27/20 2351      SLP LONG TERM GOAL #1   Title Pt will complete HEP with rare min A over 3 visits    Baseline 04-27-20    Time 2    Period --   visits, for all LTGs   Status On-going      SLP LONG TERM GOAL #2   Title Pt will tell SLP when to decr frequency of HEP    Time 3    Status On-going      SLP LONG TERM GOAL #3   Title pt will tell SLP how to  incr difficulty of HEP, with min A    Time 4    Status On-going  Plan - 04/27/20 2348    Clinical Impression Statement Pt presents today with WFL/WNL swallowing ability with dys III item (cereal bar) and nectar thick water. SLP assessed pt's success with thins today and she was told to have thins between meals.No overt s/sx aspiration PNA reported or observed today. Data suggests that as pts progress through rad or chemorad therapy that their swallowing ability will decrease. Also, WNL swallowing is threatened by muscle fibrosis that will likely develop after rad/chemorad is completed. Therefore, SLP developed an indivdualized exercise program to mitigate/eliminate pt's muscle fibrosis following and as a result of, pt's rad tx. Skilled ST would be beneficial to the pt in order to regularly assess pt's safety with POs and/or need for instrumental swallow assessment, as well as to assess accurate completion of swallowing HEP.    Speech Therapy Frequency --   once approx every 4 weeks   Duration --   7 total sessions   Treatment/Interventions Aspiration precaution training;Pharyngeal strengthening exercises;Compensatory techniques;Diet toleration management by SLP;Trials of upgraded texture/liquids;Cognitive reorganization;Internal/external aids;Patient/family education;SLP instruction and feedback    Potential to [place] provided today    Consulted and Agree with Plan of Care Patient           Patient will benefit from skilled therapeutic intervention in order to improve the following deficits and impairments:   Dysphagia, unspecified type    Problem List Patient Active Problem List   Diagnosis Date Noted  . 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 Pelvic mass in female 06/05/2013  . DM2 (diabetes mellitus, type 2) ([facility]) 09/24/2012  . AKI (acute kidney injury) ([facility]) 09/24/2012  . Elevated serum creatinine 09/03/2012  .  Anemia, unspecified 09/03/2012  . Breast cancer ([facility]) 08/31/2011  . HYPERTENSION, BENIGN 02/28/2010  . Carotid artery disease ([facility]) 01/26/2010    [facility] ,[place], [place]  04/27/2020, 11:51 PM  [facility] [address] [place], [place], [address] Phone: [phone]   Fax:  [phone]   Name: Anita Pollard MRN: [id number] Date of Birth: 1940/08/03

## 2020-04-27 NOTE — Patient Instructions (Signed)
Signs of Aspiration Pneumonia   . Chest pain/tightness . Fever (can be low grade) . Cough  o With foul-smelling phlegm (sputum) o With sputum containing pus or blood o With greenish sputum . Fatigue  . Shortness of breath  . Wheezing   **IF YOU HAVE THESE SIGNS, CONTACT YOUR DOCTOR OR GO TO THE EMERGENCY DEPARTMENT OR URGENT CARE AS SOON AS POSSIBLE**      

## 2020-05-11 ENCOUNTER — Other Ambulatory Visit (HOSPITAL_COMMUNITY): Payer: Medicare Other | Admitting: Dentistry

## 2020-05-13 ENCOUNTER — Ambulatory Visit (INDEPENDENT_AMBULATORY_CARE_PROVIDER_SITE_OTHER): Payer: Medicare Other | Admitting: Cardiology

## 2020-05-13 ENCOUNTER — Other Ambulatory Visit: Payer: Self-pay

## 2020-05-13 ENCOUNTER — Encounter: Payer: Self-pay | Admitting: Cardiology

## 2020-05-13 VITALS — BP 132/70 | HR 82 | Ht 61.0 in | Wt 128.0 lb

## 2020-05-13 DIAGNOSIS — I779 Disorder of arteries and arterioles, unspecified: Secondary | ICD-10-CM

## 2020-05-13 DIAGNOSIS — I10 Essential (primary) hypertension: Secondary | ICD-10-CM

## 2020-05-13 NOTE — Progress Notes (Signed)
Cardiology clinic note   Date:  05/13/2020   ID:  Anita Pollard, DOB 1940/05/29, MRN [id number]  Patient Location: Home Provider Location: Home  PCP:  Anita Cruel, MD  Cardiologist: Anita Dawley, MD Electrophysiologist:  None   Evaluation Performed:  Follow-Up Visit  Reason for visit: 1 year follow up   History of Present Illness:    Anita Pollard is a 80 y.o. female with hx of diabetes, hypertension, bilateral kidney transplant, 4 years ago, who was previously patient of Anita Pollard for palpitations and hypertension but not seen since 2014 till 2019.  The patient was referred by Dr. Harrington Pollard for murmur.  The patient underwent stress testing prior to her kidney transplant that was normal.  She underwent transplant at [facility].    The patient is coming after a year, treated for throat cancer with radiation to her supraglottis that was completed on March 31, 2020, she was not able to eat and has lost significant amount of weight, and currently has a feeding tube.  Because of that her statin was discontinued.  She otherwise denies any chest pain or shortness of breath, she has fatigue from cancer treatment but otherwise denies other symptoms such as palpitations, lower extremity edema no orthopnea proximal nocturnal dyspnea.  No dizziness or falls.  Past Medical History:  Diagnosis Date  . Alcohol abuse   . Anemia   . Arthritis   . Breast cancer ([facility]) 2010   Right  . Chronic kidney disease   . Colon polyp   . Depression   . Diabetes mellitus   . GERD (gastroesophageal reflux disease)   . Heart murmur   . History of kidney stones 1976  . HTN (hypertension)   . Hypothyroidism   . Osteopenia   . Pancreatitis   . Ulcerative esophagitis    Past Surgical History:  Procedure Laterality Date  . ABDOMINAL HYSTERECTOMY    . ABDOMINOPLASTY    . APPENDECTOMY    . BREAST BIOPSY Right 08/03/2008   Stereo Bx, Malignant  . BREAST LUMPECTOMY Right 08/26/2008  . CATARACT  EXTRACTION W/ INTRAOCULAR LENS  IMPLANT, BILATERAL    . CO2 LASER APPLICATION N/A 92/03/69   Procedure: CO2 LASER APPLICATION;  Surgeon: Melida Quitter, MD;  Location: [facility];  Service: ENT;  Laterality: N/A;  . COSMETIC SURGERY    . ductal carcinoma     right breast   . EYE SURGERY    . HIP ARTHROPLASTY Right   . INSERTION OF DIALYSIS CATHETER Right 09/25/2012   Procedure: INSERTION OF DIALYSIS CATHETER;  Surgeon: Mal Misty, MD;  Location: [facility];  Service: Vascular;  Laterality: Right;  Righ Internal Jugular Placement  . IR GASTROSTOMY TUBE MOD SED  02/10/2020  . JOINT REPLACEMENT    . 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 MICROLARYNGOSCOPY N/A 10/20/2019   Procedure: Suspended Microlaryngoscopy with Biopsy;  Surgeon: Melida Quitter, MD;  Location: [facility] OR;  Service: ENT;  Laterality: N/A;  . MICROLARYNGOSCOPY N/A 01/09/2020   Procedure: MICROLARYNGOSCOPY WITH BIOPSY;  Surgeon: Melida Quitter, MD;  Location: [facility];  Service: ENT;  Laterality: N/A;  . RIGID ESOPHAGOSCOPY N/A 10/20/2019   Procedure: RIGID ESOPHAGOSCOPY;  Surgeon: Melida Quitter, MD;  Location: [facility];  Service: ENT;  Laterality: N/A;  . THYROIDECTOMY    . TUMMY TUCK       Current Meds  Medication Sig  . amLODipine (NORVASC) 10 MG tablet Take 10 mg by mouth at bedtime.  . Cholecalciferol (VITAMIN  D3) 5000 UNITS TABS Take 5,000 Units by mouth 3 (three) times daily after meals.   . furosemide (LASIX) 40 MG tablet Take 1 tablet (40 mg total) by mouth daily.  Marland Kitchen glipiZIDE (GLUCOTROL XL) 2.5 MG 24 hr tablet Take 5 mg by mouth 2 (two) times daily.   . Lancets (FREESTYLE) lancets by Does not apply route.  Marland Kitchen levothyroxine (SYNTHROID) 200 MCG tablet Take 200 mcg by mouth daily before breakfast.   . losartan (COZAAR) 25 MG tablet Take 25 mg by mouth at bedtime.   . Melatonin 5 MG CAPS Take 5 mg by mouth See admin instructions. Take 5 mg 2 hours before bedtime  . omeprazole (PRILOSEC) 20 MG capsule Take 20 mg by mouth daily.  .  ondansetron (ZOFRAN) 4 MG tablet Take 4 mg by mouth every 8 (eight) hours as needed for nausea or vomiting.   . Pancrelipase, Lip-Prot-Amyl, (CREON) 3000-9500 units CPEP Take 1 capsule by mouth 3 (three) times daily with meals.   Vladimir Faster Glycol-Propyl Glycol (SYSTANE OP) Place 1 drop into both eyes every evening.  . potassium chloride (MICRO-K) 10 MEQ CR capsule Take 10-20 mEq by mouth See admin instructions. Take 20 meq by mouth daily on Sunday, Tuesday, Thursday and Saturday, take 10 meq on Monday, Wednesday and Friday  . pramipexole (MIRAPEX) 0.25 MG tablet Take 0.25-0.5 mg by mouth See admin instructions. Take 0.5 mg 2 hours prior to bedtime then 0.25 mg at bedtime  . predniSONE (DELTASONE) 10 MG tablet Take 10 mg by mouth daily with breakfast.   . tacrolimus (PROGRAF) 0.5 MG capsule Take 0.5 mg by mouth every 12 (twelve) hours.   . tacrolimus (PROGRAF) 1 MG capsule Take 1 mg by mouth every 12 (twelve) hours.   . temazepam (RESTORIL) 15 MG capsule Take 15-30 mg by mouth at bedtime.   . vitamin B-12 (CYANOCOBALAMIN) 500 MCG tablet Take 500 mcg by mouth daily.     Allergies:   Penicillins, Banana, Detrol [tolterodine], Quetiapine, Risperidone and related, and Lisinopril   Social History   Tobacco Use  . Smoking status: Former Smoker    Quit date: 09/25/1983    Years since quitting: 36.6  . Smokeless tobacco: Never Used  Vaping Use  . Vaping Use: Never used  Substance Use Topics  . 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 Drug use: No     Family Hx: The patient's family history includes Atrial fibrillation in her brother; Colon polyps in her brother; Dementia in her sister; Diabetes in her brother and father; Hypertension in her father, mother, and sister; Multiple myeloma in her sister; Thyroid cancer (age of onset: 79) in her daughter.  ROS:   Please see the history of present illness.    All  other systems reviewed and are negative.   Prior CV studies:   The following studies were reviewed today:  Labs/Other Tests and Data Reviewed:    EKG: Sinus rhythm, normal EKG, unchanged from prior.  This was personally reviewed.  Recent Labs: 01/30/2020: TSH 0.402 02/10/2020: Hemoglobin 12.1; Platelets 207 03/22/2020: ALT 14; BUN 42; Creatinine, Ser 1.08; NT-Pro BNP 313; Potassium 4.4; Sodium 137   Recent Lipid Panel Lab Results  Component Value Date/Time   CHOL 233 (H) 05/01/2018 07:49 AM   TRIG 112 05/01/2018 07:49 AM   HDL 145 05/01/2018 07:49 AM   CHOLHDL 1.6 05/01/2018 07:49 AM   LDLCALC  66 05/01/2018 07:49 AM   Wt Readings from Last 3 Encounters:  05/13/20 128 lb (58.1 kg)  04/21/20 127 lb (57.6 kg)  03/30/20 127 lb (57.6 kg)    Objective:    Vital Signs:  BP 132/70   Pulse 82   Ht '5\' 1"'  (1.549 m)   Wt 128 lb (58.1 kg)   SpO2 97%   BMI 24.19 kg/m    GEN:  Well nourished, well developed in no acute distress HEENT: Normal NECK: No JVD; No carotid bruits LYMPHATICS: No lymphadenopathy CARDIAC: RRR, no murmurs, rubs, gallops RESPIRATORY:  Clear to auscultation without rales, wheezing or rhonchi  ABDOMEN: Soft, non-tender, non-distended MUSCULOSKELETAL:  No edema; No deformity  SKIN: Warm and dry NEUROLOGIC:  Alert and oriented x 3 PSYCHIATRIC:  Normal affect    ASSESSMENT & PLAN:    1. Prior history of carotid disease, last carotid ultrasound in 04/2018 showed minimal plaque in both carotid arteries 1-39%.  We will repeat now. 2. Systolic murmur - echocardiogram in 04/2018 didn't show any significant valvular abnormalities. 3. Hyperlipidemia - with known DM, however now with throat cancer, with poor oral intake and feeding tube, we will continue to hold her rosuvastatin.    COVID-19 Education: The signs and symptoms of COVID-19 were discussed with the patient and how to seek care for testing (follow up with PCP or arrange E-visit).  The importance of social  distancing was discussed today.  Time:   Today, I have spent 15 minutes with the patient with telehealth technology discussing the above problems.    Medication Adjustments/Labs and Tests Ordered: Current medicines are reviewed at length with the patient today.  Concerns regarding medicines are outlined above.   Tests Ordered: Orders Placed This Encounter  Procedures  . EKG 12-Lead  . VAS US CAROTID   Medication Changes: No orders of the defined types were placed in this encounter.  Follow Up:  In Person in 1 year(s)  Signed, Anita Dawley, MD  05/13/2020 2:10 PM    [facility]

## 2020-05-13 NOTE — Patient Instructions (Signed)
Medication Instructions: NO CHANGES If you need a refill on your cardiac medications before your next appointment, please call your pharmacy*   Lab Work: NONE If you have labs (blood work) drawn today and your tests are completely normal, you will receive your results only by: Marland Kitchen MyChart Message (if you have MyChart) OR . A paper copy in the mail If you have any lab test that is abnormal or we need to change your treatment, we will call you to review the results.   Testing/Procedures: Your physician has requested that you have a carotid duplex. This test is an ultrasound of the carotid arteries in your neck. It looks at blood flow through these arteries that supply the brain with blood. Allow one hour for this exam. There are no restrictions or special instructions.     Follow-Up: At [facility], you and your health needs are our priority.  As part of our continuing mission to provide you with exceptional heart care, we have created designated Provider Care Teams.  These Care Teams include your primary Cardiologist (physician) and Advanced Practice Providers (APPs -  Physician Assistants and Nurse Practitioners) who all work together to provide you with the care you need, when you need it.  We recommend signing up for the patient portal called "MyChart".  Sign up information is provided on this After Visit Summary.  MyChart is used to connect with patients for Virtual Visits (Telemedicine).  Patients are able to view lab/test results, encounter notes, upcoming appointments, etc.  Non-urgent messages can be sent to your provider as well.   To learn more about what you can do with MyChart, go to [email or web address].    Your next appointment:   6 month(s)  The format for your next appointment:   In Person  Provider:   Gwyndolyn Kaufman, MD   Other Instructions NONE

## 2020-05-18 ENCOUNTER — Other Ambulatory Visit: Payer: Self-pay

## 2020-05-18 ENCOUNTER — Ambulatory Visit (HOSPITAL_COMMUNITY): Payer: Medicare Other | Admitting: Dentistry

## 2020-05-18 VITALS — BP 132/56 | HR 83 | Temp 98.0°F | Wt 125.0 lb

## 2020-05-18 DIAGNOSIS — Z923 Personal history of irradiation: Secondary | ICD-10-CM

## 2020-05-18 DIAGNOSIS — R634 Abnormal weight loss: Secondary | ICD-10-CM

## 2020-05-18 DIAGNOSIS — K117 Disturbances of salivary secretion: Secondary | ICD-10-CM

## 2020-05-18 DIAGNOSIS — R252 Cramp and spasm: Secondary | ICD-10-CM

## 2020-05-18 DIAGNOSIS — C321 Malignant neoplasm of supraglottis: Secondary | ICD-10-CM

## 2020-05-18 DIAGNOSIS — R432 Parageusia: Secondary | ICD-10-CM

## 2020-05-18 DIAGNOSIS — Z5189 Encounter for other specified aftercare: Secondary | ICD-10-CM

## 2020-05-18 NOTE — Progress Notes (Signed)
DENTAL VISIT LIMITED EXAM   Date of Appointment:  05/18/2020 Patient Name:   Anita Pollard Date of Birth:   10-21-1940 Medical Record Number: [id number]   COVID 19 SCREENING: The patient does not symptoms concerning for COVID-19 infection (Including fever, chills, cough, or new SHORTNESS OF BREATH).    . 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 Patient has completed 35 of 35 radiation treatments from 02/09/20 to 03/31/20. . Reviewed medical and dental history with the patient.  VITALS: BP (!) 132/56 (BP Location: Left Arm)   Pulse 83   Temp 98 F (36.7 C)   Wt 125 lb (56.7 kg)   BMI 23.62 kg/m    REVIEW OF CHIEF COMPLAINTS: DRY MOUTH: Yes HARD TO SWALLOW: No HURT TO SWALLOW: No TASTE CHANGES: Taste is slowly returning SORES IN MOUTH: No TRISMUS: Patient denies symptoms WEIGHT: 125 lbs - difficulty with oral intake and still has feeding tube  HOME ORAL HYGIENE REGIMEN: BRUSHING: Twice daily FLOSSING: Uses waterpik daily  RINSING: Not currently using any mouthrinses FLUORIDE: In toothpaste TRISMUS EXERCISES: Maximum interincisal opening: 42 mm down from 44 mm   Patient Active Problem List   Diagnosis Date Noted  . 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 Pelvic mass in female 06/05/2013  . DM2 (diabetes mellitus, type 2) ([place]) 09/24/2012  . AKI (acute kidney injury) ([place]) 09/24/2012  . Elevated serum creatinine 09/03/2012  . Anemia, unspecified 09/03/2012  . Breast cancer ([place]) 08/31/2011  . HYPERTENSION, BENIGN 02/28/2010  . Carotid artery disease ([place]) 01/26/2010   Past Medical History:  Diagnosis Date  . Alcohol abuse   . Anemia   . Arthritis   . Breast cancer ([place]) 2010   Right  . Chronic kidney disease   . Colon polyp   . Depression   . Diabetes mellitus   . GERD (gastroesophageal reflux disease)   . Heart murmur   . History of kidney stones 1976  . HTN (hypertension)   .  Hypothyroidism   . Osteopenia   . Pancreatitis   . Ulcerative esophagitis    Past Surgical History:  Procedure Laterality Date  . ABDOMINAL HYSTERECTOMY    . ABDOMINOPLASTY    . APPENDECTOMY    . BREAST BIOPSY Right 08/03/2008   Stereo Bx, Malignant  . BREAST LUMPECTOMY Right 08/26/2008  . CATARACT EXTRACTION W/ INTRAOCULAR LENS  IMPLANT, BILATERAL    . CO2 LASER APPLICATION N/A 40/98/1191   Procedure: CO2 LASER APPLICATION;  Surgeon: Melida Quitter, MD;  Location: [facility];  Service: ENT;  Laterality: N/A;  . COSMETIC SURGERY    . ductal carcinoma     right breast   . EYE SURGERY    . HIP ARTHROPLASTY Right   . INSERTION OF DIALYSIS CATHETER Right 09/25/2012   Procedure: INSERTION OF DIALYSIS CATHETER;  Surgeon: Mal Misty, MD;  Location: [facility];  Service: Vascular;  Laterality: Right;  Righ Internal Jugular Placement  . IR GASTROSTOMY TUBE MOD SED  02/10/2020  . JOINT REPLACEMENT    . 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 MICROLARYNGOSCOPY N/A 10/20/2019   Procedure: Suspended Microlaryngoscopy with Biopsy;  Surgeon: Melida Quitter, MD;  Location: [facility] OR;  Service: ENT;  Laterality: N/A;  . MICROLARYNGOSCOPY N/A 01/09/2020   Procedure: MICROLARYNGOSCOPY WITH BIOPSY;  Surgeon: Melida Quitter, MD;  Location: [facility];  Service: ENT;  Laterality: N/A;  . RIGID ESOPHAGOSCOPY N/A 10/20/2019   Procedure: RIGID ESOPHAGOSCOPY;  Surgeon: Melida Quitter, MD;  [facility];  Service: ENT;  Laterality: N/A;  . THYROIDECTOMY    . TUMMY TUCK     Current Outpatient Medications  Medication Sig Dispense Refill  . amLODipine (NORVASC) 10 MG tablet Take 10 mg by mouth at bedtime.    . Cholecalciferol (VITAMIN D3) 5000 UNITS TABS Take 5,000 Units by mouth 3 (three) times daily after meals.     . furosemide (LASIX) 40 MG tablet Take 1 tablet (40 mg total) by mouth daily. 90 tablet 1  . glipiZIDE (GLUCOTROL XL) 2.5 MG 24 hr tablet Take 5 mg by mouth 2 (two) times daily.     . Lancets (FREESTYLE)  lancets by Does not apply route.    Marland Kitchen levothyroxine (SYNTHROID) 200 MCG tablet Take 200 mcg by mouth daily before breakfast.     . losartan (COZAAR) 25 MG tablet Take 25 mg by mouth at bedtime.     . Melatonin 5 MG CAPS Take 5 mg by mouth See admin instructions. Take 5 mg 2 hours before bedtime    . omeprazole (PRILOSEC) 20 MG capsule Take 20 mg by mouth daily.    . ondansetron (ZOFRAN) 4 MG tablet Take 4 mg by mouth every 8 (eight) hours as needed for nausea or vomiting.     . Pancrelipase, Lip-Prot-Amyl, (CREON) 3000-9500 units CPEP Take 1 capsule by mouth 3 (three) times daily with meals.     Vladimir Faster Glycol-Propyl Glycol (SYSTANE OP) Place 1 drop into both eyes every evening.    . potassium chloride (MICRO-K) 10 MEQ CR capsule Take 10-20 mEq by mouth See admin instructions. Take 20 meq by mouth daily on Sunday, Tuesday, Thursday and Saturday, take 10 meq on Monday, Wednesday and Friday    . pramipexole (MIRAPEX) 0.25 MG tablet Take 0.25-0.5 mg by mouth See admin instructions. Take 0.5 mg 2 hours prior to bedtime then 0.25 mg at bedtime    . predniSONE (DELTASONE) 10 MG tablet Take 10 mg by mouth daily with breakfast.     . tacrolimus (PROGRAF) 0.5 MG capsule Take 0.5 mg by mouth every 12 (twelve) hours.     . tacrolimus (PROGRAF) 1 MG capsule Take 1 mg by mouth every 12 (twelve) hours.     . temazepam (RESTORIL) 15 MG capsule Take 15-30 mg by mouth at bedtime.     . vitamin B-12 (CYANOCOBALAMIN) 500 MCG tablet Take 500 mcg by mouth daily.     No current facility-administered medications for this visit.   Allergies  Allergen Reactions  . Penicillins Itching and Swelling    Swelling and flushing Has patient had a PCN reaction causing immediate rash, facial/tongue/throat swelling, SOB or lightheadedness with hypotension: yes Has patient had a PCN reaction causing severe rash involving mucus membranes or skin necrosis: no Has patient had a PCN reaction that required hospitalization:  no Has patient had a PCN reaction occurring within the last 10 years: no If all of the above answers are "NO", then may proceed with Cephalosporin use.  . Banana Nausea Only    Green bananas   . Detrol [Tolterodine]     Leg cramps  . Quetiapine Other (See Comments)    Leg cramps/restless leg  . Risperidone And Related     Unknown reaction  . Lisinopril Cough    DENTAL EXAM: Oral Hygiene:(PLAQUE): No accretions evident upon examination.  Patient is maintaining excellent oral hygiene. LOCATION OF MUCOSITIS: None DESCRIPTION OF SALIVA: Slightly viscous saliva, mild xerostomia ANY EXPOSED BONE: None OTHER WATCHED  AREAS: None   DIAGNOSES:  1. Xerostomia: Dry mouth is significant, especially at night and occasionally when swallowing saliva gets stuck in her throat.  She is drinking water, especially in the mornings.  Recommend trying CLoSYs brand mouthrinse or gel/topical paste to help relieve symptoms.  Coupons given to patient today. 2. Dysgeusia: Taste is slowly returning.  Patient reports being able to taste chocolate now and sweeter foods, but is still having difficulty with oral intake to maintain adequate nutrition.  She is only ~2 mos out of radiation, so this should continue to return to baseline and can take up to 3 mos. 3. Weight Loss: She has lost a significant amount of weight since before radiation.  She still has feeding tube in place, and says she is trying to improve her oral intake in order to have it removed. 4. Trismus: MIO decreased 2 mm total which is great.  She does not report any symptoms related to limited opening.   RECOMMENDATIONS: 1. Brush after meals and at bedtime.  Use fluoride at bedtime. 2. Use trismus exercises as directed. 3. Use CLoSYs for dry mouth.  Let us know how this works for you and if it lessens your symptoms. 4. Take multiple sips of water as needed. 5. Return to your regular dentist for routine dental care including cleanings and periodic  exams.  A referral letter to your dentist will be sent over for information regarding head and neck radiation therapy and your visits at the hospital dental clinic. 6. Call if any problems or concerns arise.   . All questions and concerns were addressed, and patient verbalized understanding of discussion, findings and recommendations.   . Patient tolerated today's visit well and departed in stable condition.   Woodbine Benson Norway, DMD

## 2020-05-18 NOTE — Patient Instructions (Signed)
[facility] Department of Dental Medicine Dr. Debe Coder B. Benson Norway, DMD Phone: [phone] Fax: [phone]   It was a pleasure seeing you again today!  Please refer to the information below regarding your dental visit with [place], and call us should you have any questions or concerns that may come up after you leave.   Thank you for giving [place] the opportunity to provide care for you.  If there is anything we can do for you, please let us know.   RECOMMENDATIONS: 1. Brush after meals and at bedtime.  Floss once a day, and use fluoride at bedtime. 2. Use trismus exercises as directed below. 3. Use CLOSYs for dry mouth, and salt water/baking soda rinses to help with any mouth sores. 4. Take multiple sips of water as needed.  Stay hydrated. 5. Return to your regular dentist for routine dental care including cleanings and periodic exams.  If you do not have a regular dentist, it is important to establish care at an outside dental office of your choice.  6. Call us if any problems or concerns arise.  TRISMUS Trismus is a condition where the jaw does not allow the mouth to open as wide as it usually does.  This can happen almost suddenly, or in other cases the process is so slow, it is hard to notice it-until it is too far along.  When the jaw joints and/or muscles have been exposed to radiation treatments, the onset of Trismus is very slow.  This is because the muscles are losing their stretching ability over a long period of time, as long as 2 YEARS after the end of radiation.  It is therefore important to exercise these muscles and joints.  TRISMUS EXERCISES:  Quinn Axe of tongue depressors measuring the same or a little less than the last documented MIO (Maximum Interincisal Opening).  Secure them with a rubber band on both ends.  Place the stack in the patient's mouth, supporting the other end.  Allow 30 seconds for muscle stretching.  Rest for a few seconds.  Repeat 3-5 times.  For all  radiation patients, this exercise is recommended in the mornings and evenings unless otherwise instructed.  The exercise should be done for a period of 2 YEARS after the end of radiation.  Maximum jaw opening should be checked routinely on recall dental visits by your general dentist.  The patient is advised to report any changes, soreness, or difficulties encountered when doing the exercises.

## 2020-05-19 ENCOUNTER — Inpatient Hospital Stay: Payer: Medicare Other | Attending: Radiation Oncology

## 2020-05-19 ENCOUNTER — Other Ambulatory Visit: Payer: Self-pay

## 2020-05-19 NOTE — Progress Notes (Signed)
Nutrition Follow-up:  Patient with supraglottis cancer.  She has completed radiation (03/31/2020).    Met with patient in clinic today.  Patient has not been using feeding tube for the last 4 weeks. Flushes tube daily with 53m of water.   Patient has been eating orally typically 3 meals per day.  Reports that she still can't taste but continues to eat. Can taste chocolate and cantelope.  Last night ate swedish meatballs with gravy and noodles and carrots.  Lunch is usually sandwich (peanut butter and jelly or ham/turkey and cheese) or soup.  Breakfast she enjoys [employer] cereal with fruit and walnuts.  Often drinks a premier protein shake with lunch.  Still having problems getting choked on thin water.  Patient has seen SLP and notes reviewed.    Reports that in the last 4 weeks she has had hiccups during the day and will sometimes wake her up at early am.  She is asking what can be done about these.      Medications: reviewed  Labs: reviewed  Anthropometrics:   Weight 128 lb 4 oz today in RD office  127 lb on 1/26 126 lb 8 oz on 12/28 129 lb on 12/22 126 lb on 12/2 127 lb on 11/15   Estimated Energy Needs  Kcals: 1700-1900 Protein: 85-95 g Fluid: 1.7 L  NUTRITION DIAGNOSIS: Inadequate oral intake improved   INTERVENTION:  Patient has been able to maintain weight actually gained a pound in the last 4 weeks without using feeding tube.  Consideration can be given to having tube removed.  Message sent to Dr SIsidore Moos Message sent to Dr SIsidore Moosand team regarding hiccups. Encouraged patient to continue to eat high calorie, high protein foods to maintain weight.  Patient has contact information   NEXT VISIT: as needed  Nathan Stallworth B. AZenia Resides RDayton LKeyesportRegistered Dietitian [phone](mobile)

## 2020-05-21 ENCOUNTER — Other Ambulatory Visit: Payer: Self-pay

## 2020-05-21 DIAGNOSIS — C321 Malignant neoplasm of supraglottis: Secondary | ICD-10-CM

## 2020-05-21 NOTE — Progress Notes (Unsigned)
IR

## 2020-05-21 NOTE — Progress Notes (Signed)
Oncology Nurse Navigator Documentation  Dr. Isidore Moos received a message from Jennet Maduro RD regarding Anita Pollard. Joli reported that Ms. [name] has maintained a stable weight and eating all her foods orally without using her PEG tube for one month. Dr. Isidore Moos has ordered to have the PEG tube removed and I just spoke with the patient to inform her of the order. She is aware that she will receive a call from centralized scheduling for an appointment. She also reported hiccups to [facility] and I informed her of Dr. Pearlie Oyster recommendation to speak with her PCP or nephrologist. [name] knows to call me if she has any needs or questions.   Harlow Asa RN, BSN, OCN Head & Neck Oncology Nurse [place] at [facility] Phone # [phone]  Fax # [phone]

## 2020-05-25 ENCOUNTER — Other Ambulatory Visit: Payer: Self-pay

## 2020-05-25 ENCOUNTER — Ambulatory Visit (HOSPITAL_COMMUNITY)
Admission: RE | Admit: 2020-05-25 | Discharge: 2020-05-25 | Disposition: A | Discharge status: Home / Self Care | Payer: Medicare Other | Source: Ambulatory Visit | Attending: Radiation Oncology | Admitting: Radiation Oncology

## 2020-05-25 DIAGNOSIS — C321 Malignant neoplasm of supraglottis: Secondary | ICD-10-CM | POA: Insufficient documentation

## 2020-05-25 DIAGNOSIS — Z431 Encounter for attention to gastrostomy: Secondary | ICD-10-CM | POA: Diagnosis not present

## 2020-05-25 HISTORY — PX: IR GASTROSTOMY TUBE REMOVAL: IMG5492

## 2020-05-25 MED ORDER — SILVER NITRATE-POT NITRATE 75-25 % EX MISC: 1.0000 | Freq: Once | CUTANEOUS | Status: DC

## 2020-05-25 MED ORDER — LIDOCAINE VISCOUS HCL 2 % MT SOLN
OROMUCOSAL | Status: AC
  Administered 2020-05-25: 5 mL via GASTROSTOMY
  Filled 2020-05-25: qty 15

## 2020-05-25 MED ORDER — SILVER NITRATE-POT NITRATE 75-25 % EX MISC
CUTANEOUS | Status: AC
  Administered 2020-05-25: 1
  Filled 2020-05-25: qty 10

## 2020-05-25 MED ORDER — LIDOCAINE VISCOUS HCL 2 % MT SOLN: 15.0000 mL | Freq: Once | OROMUCOSAL | Status: DC

## 2020-05-25 NOTE — Procedures (Signed)
Interventional Radiology Procedure Note  PROCEDURE SUMMARY:  Successful removal of gastrostomy tube.  No complications.   EBL = trace  Please see full dictation in imaging section of Epic for procedure details.   Armando Gang Takako Minckler PA-C 05/25/2020 9:32 AM

## 2020-06-02 ENCOUNTER — Ambulatory Visit: Payer: Medicare Other | Attending: Radiation Oncology

## 2020-06-02 ENCOUNTER — Other Ambulatory Visit: Payer: Self-pay

## 2020-06-02 DIAGNOSIS — R1310 Dysphagia, unspecified: Secondary | ICD-10-CM | POA: Insufficient documentation

## 2020-06-02 NOTE — Therapy (Signed)
[facility] [address] [place], [place], [address] Phone: [phone]   Fax:  [phone]  Speech Language Pathology Treatment/Recertification  Patient Details  Name: Anita Pollard MRN: [id number] Date of Birth: [birth year] Referring Provider (SLP): Eppie Gibson, MD   Encounter Date: 06/02/2020   End of Session - 06/02/20 1446    Visit Number 4    Number of Visits 7    Date for SLP Re-Evaluation 08/27/20   90 days   SLP Start Time 5053    SLP Stop Time  1440    SLP Time Calculation (min) 36 min    Activity Tolerance Patient tolerated treatment well           Past Medical History:  Diagnosis Date  . Alcohol abuse   . Anemia   . Arthritis   . Breast cancer ([place]) 2010   Right  . Chronic kidney disease   . Colon polyp   . Depression   . Diabetes mellitus   . GERD (gastroesophageal reflux disease)   . Heart murmur   . History of kidney stones 1976  . HTN (hypertension)   . Hypothyroidism   . Osteopenia   . Pancreatitis   . Ulcerative esophagitis     Past Surgical History:  Procedure Laterality Date  . ABDOMINAL HYSTERECTOMY    . ABDOMINOPLASTY    . APPENDECTOMY    . BREAST BIOPSY Right 08/03/2008   Stereo Bx, Malignant  . BREAST LUMPECTOMY Right 08/26/2008  . CATARACT EXTRACTION W/ INTRAOCULAR LENS  IMPLANT, BILATERAL    . CO2 LASER APPLICATION N/A 97/67/3419   Procedure: CO2 LASER APPLICATION;  Surgeon: Melida Quitter, MD;  Location: [facility];  Service: ENT;  Laterality: N/A;  . COSMETIC SURGERY    . ductal carcinoma     right breast   . EYE SURGERY    . HIP ARTHROPLASTY Right   . INSERTION OF DIALYSIS CATHETER Right 09/25/2012   Procedure: INSERTION OF DIALYSIS CATHETER;  Surgeon: Mal Misty, MD;  Location: [facility];  Service: Vascular;  Laterality: Right;  Righ Internal Jugular Placement  . IR GASTROSTOMY TUBE MOD SED  02/10/2020  . IR GASTROSTOMY TUBE REMOVAL  05/25/2020  . JOINT REPLACEMENT     . 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 MICROLARYNGOSCOPY N/A 10/20/2019   Procedure: Suspended Microlaryngoscopy with Biopsy;  Surgeon: Melida Quitter, MD;  Location: [facility] OR;  Service: ENT;  Laterality: N/A;  . MICROLARYNGOSCOPY N/A 01/09/2020   Procedure: MICROLARYNGOSCOPY WITH BIOPSY;  Surgeon: Melida Quitter, MD;  Location: [facility];  Service: ENT;  Laterality: N/A;  . RIGID ESOPHAGOSCOPY N/A 10/20/2019   Procedure: RIGID ESOPHAGOSCOPY;  Surgeon: Melida Quitter, MD;  Location: [facility];  Service: ENT;  Laterality: N/A;  . THYROIDECTOMY    . TUMMY TUCK      There were no vitals filed for this visit.   Subjective Assessment - 06/02/20 1412    Subjective "You know what Anita Pollard - I still cough with the water."    Currently in Pain? No/denies                 ADULT SLP TREATMENT - 06/02/20 1413      General Information   Behavior/Cognition Alert;Cooperative;Pleasant mood      Treatment Provided   Treatment provided Dysphagia      Dysphagia Treatment   Temperature Spikes Noted No    Respiratory Status Room air    Treatment Methods Skilled observation;Therapeutic exercise;Patient/caregiver education  Patient observed directly with PO's Yes    Type of PO's observed Thin liquids;Dysphagia 1 (puree)    Liquids provided via Cup    Oral Phase Signs & Symptoms Other (comment)   nothing seen today   Pharyngeal Phase Signs & Symptoms Other (comment)   nothing seen today   Other treatment/comments "I think I'm having more trouble now staying hydrated. I'm more afraid of choking so I don't have as much." SLP suggested a follow up modified (MBSS) and pt did not desire to go back for another test and pt told SLP she was completely fine with maintaining 1/4 nectar liquids (slight thickened). SLP asked pt about mixed consistency foods (cereal, fresh fruit) and pt stated no coughing/throat clearing. Pt was independent with HEP.      Assessment / Recommendations / Plan   Plan Continue with current  plan of care   next visit 2 months     Dysphagia Recommendations   Diet recommendations Nectar-thick liquid;Regular    Liquids provided via Cup    Medication Administration --   as tolerated   Compensations Slow rate;Small sips/bites      Progression Toward Goals   Progression toward goals Progressing toward goals            SLP Education - 06/02/20 1445    Education Details she can return to all "almost nectar" liquids if she likes    Person(s) Educated Patient    Methods Explanation    Comprehension Verbalized understanding            SLP Short Term Goals - 04/27/20 2350      SLP SHORT TERM GOAL #1   Title Pt will tell SLP rationale for HEP completion    Period --   visit, for all STGs   Status Achieved      SLP SHORT TERM GOAL #2   Title Pt will complete HEP with occasional min A over two sessions    Baseline 03-11-20    Status Achieved      SLP SHORT TERM GOAL #3   Title pt will tell SLP 3 overt s/sx aspiration PNA    Status Achieved      SLP SHORT TERM GOAL #4   Title Pt will tell SLP how a food journal can hasten/facilitate return to more normalized diet    Status Deferred            SLP Long Term Goals - 06/02/20 1449      SLP LONG TERM GOAL #1   Title Pt will complete HEP with rare min A over 3 visits    Baseline 04-27-20, 06-02-20    Time 1    Period --   visits, for all LTGs   Status On-going      SLP LONG TERM GOAL #2   Title Pt will tell SLP when to decr frequency of HEP    Time 2    Status On-going      SLP LONG TERM GOAL #3   Title pt will tell SLP how to incr difficulty of HEP, with min A    Time 3    Status On-going            Plan - 06/02/20 1446    Clinical Impression Statement Pt presents today with WFL/WNL swallowing ability with dys I item (applesauce) and tiy sips of thin water. Pt states she prefers "almost nectar" which she was on prior to last session with SLP thta SLP suggested  thin between meals. No overt s/sx aspiration  PNA reported or observed today. Data suggests that as pts progress through rad or chemorad therapy that their swallowing ability will decrease. Also, WNL swallowing is threatened by muscle fibrosis that will likely develop after rad/chemorad is completed. Therefore, SLP developed an indivdualized exercise program to mitigate/eliminate pt's muscle fibrosis following and as a result of, pt's rad tx. Skilled ST would be beneficial to the pt in order to regularly assess pt's safety with POs and/or need for instrumental swallow assessment, as well as to assess accurate completion of swallowing HEP. Pt agrees to once every other month.    Speech Therapy Frequency --   once approx every 8 weeks   Duration --   7 total sessions   Treatment/Interventions Aspiration precaution training;Pharyngeal strengthening exercises;Compensatory techniques;Diet toleration management by SLP;Trials of upgraded texture/liquids;Cognitive reorganization;Internal/external aids;Patient/family education;SLP instruction and feedback    Potential to [place] provided today    Consulted and Agree with Plan of Care Patient           Patient will benefit from skilled therapeutic intervention in order to improve the following deficits and impairments:   Dysphagia, unspecified type    Problem List Patient Active Problem List   Diagnosis Date Noted  . 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 Pelvic mass in female 06/05/2013  . DM2 (diabetes mellitus, type 2) ([place]) 09/24/2012  . AKI (acute kidney injury) ([place]) 09/24/2012  . Elevated serum creatinine 09/03/2012  . Anemia, unspecified 09/03/2012  . Breast cancer ([place]) 08/31/2011  . HYPERTENSION, BENIGN 02/28/2010  . Carotid artery disease ([place]) 01/26/2010    [facility] [address], [place]  06/02/2020, 2:49 PM  [facility] [address] [place], [place], [address] Phone:  [phone]   Fax:  [phone]   Name: Anita Pollard MRN: [id number] Date of Birth: 1941/03/23

## 2020-06-02 NOTE — Patient Instructions (Signed)
  If you really don't mind the "almost-nectar" liquids and don't want to go to have another swallowing test you can just go back to all "almost-nectar" liquids.

## 2020-06-03 ENCOUNTER — Ambulatory Visit (HOSPITAL_COMMUNITY)
Admission: RE | Admit: 2020-06-03 | Discharge: 2020-06-03 | Disposition: A | Discharge status: Home / Self Care | Payer: Medicare Other | Source: Ambulatory Visit | Attending: Cardiology | Admitting: Cardiology

## 2020-06-03 DIAGNOSIS — I6521 Occlusion and stenosis of right carotid artery: Secondary | ICD-10-CM

## 2020-06-03 DIAGNOSIS — I779 Disorder of arteries and arterioles, unspecified: Secondary | ICD-10-CM | POA: Insufficient documentation

## 2020-06-09 ENCOUNTER — Other Ambulatory Visit: Payer: Self-pay | Admitting: Cardiology

## 2020-06-09 DIAGNOSIS — Z79899 Other long term (current) drug therapy: Secondary | ICD-10-CM

## 2020-06-09 DIAGNOSIS — Z9221 Personal history of antineoplastic chemotherapy: Secondary | ICD-10-CM

## 2020-06-09 DIAGNOSIS — R600 Localized edema: Secondary | ICD-10-CM

## 2020-06-09 DIAGNOSIS — C140 Malignant neoplasm of pharynx, unspecified: Secondary | ICD-10-CM

## 2020-06-22 NOTE — Progress Notes (Deleted)
  Patient Name: Anita Pollard MRN: [id number] DOB: 1940/10/17 Referring Physician: Redmond Baseman [name] (Profile Not Attached) Date of Service: 03/31/2020 [facility], [place]                                                        End Of Treatment Note  Diagnoses: C32.1-Malignant neoplasm of supraglottis  Cancer Staging: Cancer Staging Malignant neoplasm of supraglottis [facility]) Staging form: Larynx - Supraglottis, AJCC 8th Edition - Clinical stage from 01/20/2020: Stage II (cT2, cN0, cM0) - Signed by Eppie Gibson, MD on 06/22/2020 Stage prefix: Initial diagnosis   Intent: Curative  Radiation Treatment Dates: 02/09/2020 through 03/31/2020 Site Technique Total Dose (Gy) Dose per Fx (Gy) Completed Fx Beam Energies  Larynx: HN_supragl IMRT 70/70 2 35/35 6X   Narrative: The patient tolerated radiation therapy relatively well.   Plan: The patient will follow-up with radiation oncology in 2-3 wks. -----------------------------------  Eppie Gibson, MD

## 2020-07-19 ENCOUNTER — Ambulatory Visit (HOSPITAL_COMMUNITY)
Admission: RE | Admit: 2020-07-19 | Discharge: 2020-07-19 | Disposition: A | Discharge status: Home / Self Care | Payer: Medicare Other | Source: Ambulatory Visit | Attending: Radiation Oncology | Admitting: Radiation Oncology

## 2020-07-19 ENCOUNTER — Other Ambulatory Visit: Payer: Self-pay

## 2020-07-19 DIAGNOSIS — R911 Solitary pulmonary nodule: Secondary | ICD-10-CM | POA: Diagnosis not present

## 2020-07-19 DIAGNOSIS — C321 Malignant neoplasm of supraglottis: Secondary | ICD-10-CM | POA: Insufficient documentation

## 2020-07-19 LAB — GLUCOSE, CAPILLARY: Glucose-Capillary: 160 mg/dL — ABNORMAL HIGH (ref 70–99)

## 2020-07-19 MED ORDER — FLUDEOXYGLUCOSE F - 18 (FDG) INJECTION
6.2000 | Freq: Once | INTRAVENOUS | Status: AC | PRN
  Administered 2020-07-19: 6.2 via INTRAVENOUS

## 2020-07-23 ENCOUNTER — Other Ambulatory Visit: Payer: Self-pay

## 2020-07-23 ENCOUNTER — Ambulatory Visit
Admission: RE | Admit: 2020-07-23 | Discharge: 2020-07-23 | Disposition: A | Discharge status: Home / Self Care | Payer: Medicare Other | Source: Ambulatory Visit | Attending: Radiation Oncology | Admitting: Radiation Oncology

## 2020-07-23 VITALS — BP 136/50 | HR 67 | Temp 96.9°F | Resp 18 | Ht 61.0 in | Wt 129.4 lb

## 2020-07-23 DIAGNOSIS — R682 Dry mouth, unspecified: Secondary | ICD-10-CM | POA: Insufficient documentation

## 2020-07-23 DIAGNOSIS — E039 Hypothyroidism, unspecified: Secondary | ICD-10-CM | POA: Insufficient documentation

## 2020-07-23 DIAGNOSIS — Z79890 Hormone replacement therapy (postmenopausal): Secondary | ICD-10-CM | POA: Diagnosis not present

## 2020-07-23 DIAGNOSIS — Z7984 Long term (current) use of oral hypoglycemic drugs: Secondary | ICD-10-CM | POA: Insufficient documentation

## 2020-07-23 DIAGNOSIS — R066 Hiccough: Secondary | ICD-10-CM | POA: Insufficient documentation

## 2020-07-23 DIAGNOSIS — Z923 Personal history of irradiation: Secondary | ICD-10-CM | POA: Diagnosis not present

## 2020-07-23 DIAGNOSIS — Z658 Other specified problems related to psychosocial circumstances: Secondary | ICD-10-CM | POA: Insufficient documentation

## 2020-07-23 DIAGNOSIS — R911 Solitary pulmonary nodule: Secondary | ICD-10-CM | POA: Insufficient documentation

## 2020-07-23 DIAGNOSIS — Z79899 Other long term (current) drug therapy: Secondary | ICD-10-CM | POA: Insufficient documentation

## 2020-07-23 DIAGNOSIS — Z85818 Personal history of malignant neoplasm of other sites of lip, oral cavity, and pharynx: Secondary | ICD-10-CM | POA: Insufficient documentation

## 2020-07-23 DIAGNOSIS — C321 Malignant neoplasm of supraglottis: Secondary | ICD-10-CM

## 2020-07-23 NOTE — Progress Notes (Signed)
Oncology Nurse Navigator Documentation  I met with Anita Pollard during her post treatment follow up appointment with Dr. Isidore Moos. She received her PET results today and is aware that we will schedule a follow up CT scan in 4 months and she will see Dr. Isidore Moos the following day for results. I will also assist with scheduling an appointment with her ENT Dr. Redmond Baseman. [name] knows to call me with any further questions or concerns.  A fax was sent to Dr. Redmond Baseman at [facility] ENT Scheduling with request  Ms. Rake be contacted and scheduled for 2 month routine post-RT follow-up.  Notification of successful fax transmission received.  Harlow Asa RN, BSN, OCN Head & Neck Oncology Nurse [place] at [facility] Phone # [phone]  Fax # [phone]

## 2020-07-23 NOTE — Progress Notes (Signed)
Anita Pollard presents today for follow-up after completing radiation to her supraglottis on 03/31/2020 and to review PET scan results from 07/19/2020  Pain issues, if any: Reports throat pain has resolved, but reports sore area behind her left jaw. Also reports dealing with sciatica flare-up Using a feeding tube?: N/A--removed 05/25/2020 Weight changes, if any: Reports decrease appetite but states she is diligent about making herself eat small meals throughout the day Wt Readings from Last 3 Encounters:  07/23/20 129 lb 6 oz (58.7 kg)  05/19/20 128 lb 4 oz (58.2 kg)  05/18/20 125 lb (56.7 kg)   Swallowing issues, if any: Yes--Continues to struggle with thin liquids, so she still adds a small amount of thinkener to her drinks. Denies any issues with more solid/soft foods.  06/02/2020 Saw [name]: "Pt presents today with WFL/WNL swallowing ability with dys I item (applesauce) and tiy sips of thin water. Pt states she prefers "almost nectar" which she was on prior to last session with SLP thta SLP suggested thin between meals. No overt s/sx aspiration PNA reported or observed today" Smoking or chewing tobacco? None Using fluoride trays daily? N/A--did meet with Dr. Benson Norway on 05/18/2020 Last ENT visit was on: Not since diagnosis Other notable issues, if any: Reports occasional hiccups that resolve on their own fairly quickly. Continues to deal with dry mouth and mild vocal hoarseness. Denies any noticeable symptoms of lymphedema to her jaw/neck. Reports she recently found out she has issues with her gallbladder and is waiting to be scheduled for an [place]. Having some social stressors, but is doing her best to not let it overwhelm her  Vitals:   07/23/20 1101  BP: (!) 136/50  Pulse: 67  Resp: 18  Temp: (!) 96.9 F (36.1 C)  SpO2: 100%

## 2020-07-26 ENCOUNTER — Encounter: Payer: Self-pay | Admitting: Radiation Oncology

## 2020-07-26 ENCOUNTER — Telehealth: Payer: Self-pay | Admitting: Cardiovascular Disease

## 2020-07-26 DIAGNOSIS — Z79899 Other long term (current) drug therapy: Secondary | ICD-10-CM

## 2020-07-26 DIAGNOSIS — C140 Malignant neoplasm of pharynx, unspecified: Secondary | ICD-10-CM

## 2020-07-26 DIAGNOSIS — Z9221 Personal history of antineoplastic chemotherapy: Secondary | ICD-10-CM

## 2020-07-26 DIAGNOSIS — R600 Localized edema: Secondary | ICD-10-CM

## 2020-07-26 NOTE — Progress Notes (Signed)
Radiation Oncology         [phone] ________________________________  Name: Anita Pollard MRN: [id number]  Date: 07/23/2020  DOB: Oct 12, 1940  Follow-Up Visit Note  CC: Lawerance Cruel, MD  Melida Quitter, MD  Diagnosis and Prior Radiotherapy:       ICD-10-CM   1. 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 Also reports dealing with sciatica flare-up Using a feeding tube?: N/A--removed 05/25/2020 Weight changes, if any: Reports decrease appetite but states she is diligent about making herself eat small meals throughout the day Wt Readings from Last 3 Encounters:  07/23/20 129 lb 6 oz (58.7 kg)  05/19/20 128 lb 4 oz (58.2 kg)  05/18/20 125 lb (56.7 kg)   Swallowing issues, if any: Yes--Continues to struggle with thin liquids, so she still adds a small amount of thinkener to her drinks. Denies any issues with more solid/soft foods.  06/02/2020 Saw [name]: "Pt presents today with WFL/WNL swallowing ability with dys I item (applesauce) and tiy sips of thin water. Pt states she prefers "almost nectar" which she was on prior to last session with SLP thta SLP suggested thin between meals. No overt s/sx aspiration PNA reported or observed today" Smoking or chewing tobacco? None Using fluoride trays daily? N/A--did meet with Dr. Benson Norway on 05/18/2020 Last ENT visit was on: Not since diagnosis Other notable issues, if any: Reports occasional hiccups that resolve on  their own fairly quickly. Continues to deal with dry mouth and mild vocal hoarseness. Denies any noticeable symptoms of lymphedema to her jaw/neck. Reports she recently found out she has issues with her gallbladder and is waiting to be scheduled for an [place]. Having some social stressors, but is doing her best to not let it overwhelm her   Vitals:   07/23/20 1101  BP: (!) 136/50  Pulse: 67  Resp: 18  Temp: (!) 96.9 F (36.1 C)  SpO2: 100%                       ALLERGIES:  is allergic to penicillins, banana, detrol [tolterodine], quetiapine, risperidone and related, and lisinopril.  Meds: Current Outpatient Medications  Medication Sig Dispense Refill  . amLODipine (NORVASC) 10 MG tablet Take 10 mg by mouth at bedtime.    . Cholecalciferol (VITAMIN D3) 5000 UNITS TABS Take 5,000 Units by mouth 3 (three) times daily after meals.     . furosemide (LASIX) 40 MG tablet Take 1 tablet (40 mg total) by mouth daily. 90 tablet 3  . glipiZIDE (GLUCOTROL XL) 2.5 MG 24 hr tablet Take 5 mg by mouth 2 (two) times daily.     . Lancets (FREESTYLE) lancets by Does not apply route.    Marland Kitchen levothyroxine (SYNTHROID) 200 MCG tablet Take 200 mcg by mouth daily before breakfast.     . losartan (COZAAR) 25 MG tablet Take 25 mg by mouth at bedtime.     . Melatonin 5 MG CAPS Take 5 mg  by mouth See admin instructions. Take 5 mg 2 hours before bedtime    . omeprazole (PRILOSEC) 20 MG capsule Take 20 mg by mouth daily.    . ondansetron (ZOFRAN) 4 MG tablet Take 4 mg by mouth every 8 (eight) hours as needed for nausea or vomiting.     . Pancrelipase, Lip-Prot-Amyl, (CREON) 3000-9500 units CPEP Take 1 capsule by mouth 3 (three) times daily with meals.     Vladimir Faster Glycol-Propyl Glycol (SYSTANE OP) Place 1 drop into both eyes every evening.    . potassium chloride (MICRO-K) 10 MEQ CR capsule Take 10-20 mEq by mouth See admin instructions. Take 20 meq by mouth daily on Sunday, Tuesday, Thursday and Saturday, take 10 meq  on Monday, Wednesday and Friday    . pramipexole (MIRAPEX) 0.25 MG tablet Take 0.25-0.5 mg by mouth See admin instructions. Take 0.5 mg 2 hours prior to bedtime then 0.25 mg at bedtime    . predniSONE (DELTASONE) 10 MG tablet Take 10 mg by mouth daily with breakfast.     . tacrolimus (PROGRAF) 0.5 MG capsule Take 0.5 mg by mouth every 12 (twelve) hours.     . tacrolimus (PROGRAF) 1 MG capsule Take 1 mg by mouth every 12 (twelve) hours.     . temazepam (RESTORIL) 15 MG capsule Take 15-30 mg by mouth at bedtime.     . vitamin B-12 (CYANOCOBALAMIN) 500 MCG tablet Take 500 mcg by mouth daily.     No current facility-administered medications for this encounter.    Physical Findings: The patient is in no acute distress. Patient is alert and oriented. Wt Readings from Last 3 Encounters:  07/23/20 129 lb 6 oz (58.7 kg)  05/19/20 128 lb 4 oz (58.2 kg)  05/18/20 125 lb (56.7 kg)    height is 5\' 1"  (1.549 m) and weight is 129 lb 6 oz (58.7 kg). Her temporal temperature is 96.9 F (36.1 C) (abnormal). Her blood pressure is 136/50 (abnormal) and her pulse is 67. Her respiration is 18 and oxygen saturation is 100%. .  General: Alert and oriented, in no acute distress  HEENT: Head is normocephalic. Extraocular movements are intact.  No oral cavity lesions nor any lesions in her upper throat. Neck: Neck is notable for no palpable adenopathy. Skin: Skin in treatment fields shows satisfactory healing in treatment fields. Psychiatric: Judgment and insight are intact. Affect is appropriate.   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 Malignant supraglottic  tumor. EXAM: NUCLEAR MEDICINE PET SKULL BASE TO THIGH TECHNIQUE: 6.2 mCi F-18 FDG was injected intravenously. Full-ring PET imaging was performed from the skull base to thigh after the radiotracer. CT data was obtained and used for attenuation correction and anatomic localization. Fasting blood glucose: 160 mg/dl COMPARISON:  PET-CT 01/29/2020 FINDINGS: Mediastinal blood pool activity: SUV max 3.1 Liver activity: SUV max 3.4 NECK: Mild asymmetric uptake in the LEFT arytenoid cartilage is favored benign. No abnormal supraglottic radiotracer activity. No activity in vocal cords. No hypermetabolic cervical lymph nodes. No hypermetabolic supraclavicular Incidental CT findings: none CHEST: No hypermetabolic mediastinal or hilar nodes. No suspicious  pulmonary nodules on the CT scan. 10 mm nodule in the LEFT lower lobe compares to 6 mm (image 67/4). Mild metabolic activity SUV max equal 1.9 similar SUV max equal 1.2 on prior. Incidental CT findings: none ABDOMEN/PELVIS: No abnormal hypermetabolic activity within the liver, pancreas, adrenal glands, or spleen. No hypermetabolic lymph nodes in the abdomen or pelvis. Scattered radiotracer activity within the colon is favored benign. Incidental CT findings: Kidneys are atrophic SKELETON: No focal hypermetabolic activity to suggest skeletal metastasis. Incidental CT findings: none IMPRESSION: 1. No evidence of local recurrence of head neck cancer at the level of the glottic or supraglottic tissue. 2. No evidence of metastatic adenopathy in the neck. 3. Potential interval enlargement LEFT lobe pulmonary nodule with mild metabolic activity. Recommend close attention on follow-up. Electronically Signed   By: Suzy Bouchard M.D.   On: 07/19/2020 20:40    Impression/Plan:    1) Head and Neck Cancer Status: No evidence of disease.  I have personally reviewed her PET scan.  There is a lesion in the left lower lobe that is nonspecific.  I would be surprised if this is a  metastasis.  We will obtain another CT scan of the chest without contrast in 4 months to follow this closely, and I will see her back at that time  2) Nutritional Status: Weight has stabilized.   PEG tube removed  3) Risk Factors: The patient has been educated about risk factors including alcohol and tobacco abuse; they understand that avoidance of alcohol and tobacco is important to prevent recurrences as well as other cancers  4) Swallowing:   Continue swallowing exercises per SLP  5) Dental: Encouraged to continue regular followup with dentistry, and dental hygiene including fluoride rinses.   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  She will continue supplementation and labs per her doctor's instructions. Lab Results  Component Value Date   TSH 0.402 01/30/2020    7)   Follow-up in about 4 months with restaging chest CT scan without contrast scan. The patient was encouraged to call with any issues or questions before then.   Anderson Malta, our navigator, will arrange follow-up with Dr. Redmond Baseman in the interim  On date of service, in total, I spent 20 minutes on this encounter. Patient was seen in person. _____________________________________   Eppie Gibson, MD

## 2020-07-26 NOTE — Telephone Encounter (Signed)
*  STAT* If patient is at the pharmacy, call can be transferred to refill team.   1. Which medications need to be refilled? (please list name of each medication and dose if known) furosemide (LASIX) 40 MG tablet  2. Which pharmacy/location (including street and city if local pharmacy) is medication to be sent to? [place], [place]  3. Do they need a 30 day or 90 day supply? 90 day supply  Pt was a former pt of Dr. Meda Coffee

## 2020-07-27 ENCOUNTER — Other Ambulatory Visit: Payer: Self-pay | Admitting: Family Medicine

## 2020-07-27 DIAGNOSIS — R1011 Right upper quadrant pain: Secondary | ICD-10-CM

## 2020-07-29 ENCOUNTER — Other Ambulatory Visit: Payer: Self-pay | Admitting: Family Medicine

## 2020-07-29 ENCOUNTER — Ambulatory Visit
Admission: RE | Admit: 2020-07-29 | Discharge: 2020-07-29 | Disposition: A | Discharge status: Home / Self Care | Payer: Medicare Other | Source: Ambulatory Visit | Attending: Family Medicine | Admitting: Family Medicine

## 2020-07-29 DIAGNOSIS — R1011 Right upper quadrant pain: Secondary | ICD-10-CM

## 2020-08-10 ENCOUNTER — Ambulatory Visit: Payer: Medicare Other

## 2020-08-16 ENCOUNTER — Ambulatory Visit: Payer: Medicare Other | Attending: Radiation Oncology

## 2020-08-16 ENCOUNTER — Other Ambulatory Visit: Payer: Self-pay

## 2020-08-16 DIAGNOSIS — R1310 Dysphagia, unspecified: Secondary | ICD-10-CM | POA: Diagnosis present

## 2020-08-17 ENCOUNTER — Other Ambulatory Visit: Payer: Self-pay

## 2020-08-17 DIAGNOSIS — R1310 Dysphagia, unspecified: Secondary | ICD-10-CM

## 2020-08-17 DIAGNOSIS — C321 Malignant neoplasm of supraglottis: Secondary | ICD-10-CM

## 2020-08-17 NOTE — Therapy (Signed)
[facility] [address] [place], [place], [address] Phone: [phone]   Fax:  [phone]  Speech Language Pathology Treatment  Patient Details  Name: Anita Pollard MRN: [id number] Date of Birth: 1940-11-22 Referring Provider (SLP): Eppie Gibson, MD   Encounter Date: 08/16/2020   End of Session - 08/17/20 1001    Visit Number 5    Number of Visits 7    Date for SLP Re-Evaluation 08/27/20   90 days   SLP Start Time 0973    SLP Stop Time  5329    SLP Time Calculation (min) 39 min    Activity Tolerance Patient tolerated treatment well           Past Medical History:  Diagnosis Date  . Alcohol abuse   . Anemia   . Arthritis   . Breast cancer ([place]) 2010   Right  . Chronic kidney disease   . Colon polyp   . Depression   . Diabetes mellitus   . GERD (gastroesophageal reflux disease)   . Heart murmur   . History of kidney stones 1976  . HTN (hypertension)   . Hypothyroidism   . Osteopenia   . Pancreatitis   . Ulcerative esophagitis     Past Surgical History:  Procedure Laterality Date  . ABDOMINAL HYSTERECTOMY    . ABDOMINOPLASTY    . APPENDECTOMY    . BREAST BIOPSY Right 08/03/2008   Stereo Bx, Malignant  . BREAST LUMPECTOMY Right 08/26/2008  . CATARACT EXTRACTION W/ INTRAOCULAR LENS  IMPLANT, BILATERAL    . CO2 LASER APPLICATION N/A [id number]/6834   Procedure: CO2 LASER APPLICATION;  Surgeon: Melida Quitter, MD;  Location: [facility];  Service: ENT;  Laterality: N/A;  . COSMETIC SURGERY    . ductal carcinoma     right breast   . EYE SURGERY    . HIP ARTHROPLASTY Right   . INSERTION OF DIALYSIS CATHETER Right 09/25/2012   Procedure: INSERTION OF DIALYSIS CATHETER;  Surgeon: Mal Misty, MD;  Location: [facility];  Service: Vascular;  Laterality: Right;  Righ Internal Jugular Placement  . IR GASTROSTOMY TUBE MOD SED  02/10/2020  . IR GASTROSTOMY TUBE REMOVAL  05/25/2020  . JOINT REPLACEMENT    . 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 MICROLARYNGOSCOPY N/A 10/20/2019   Procedure: Suspended Microlaryngoscopy with Biopsy;  Surgeon: Melida Quitter, MD;  Location: [facility] OR;  Service: ENT;  Laterality: N/A;  . MICROLARYNGOSCOPY N/A 01/09/2020   Procedure: MICROLARYNGOSCOPY WITH BIOPSY;  Surgeon: Melida Quitter, MD;  Location: [facility];  Service: ENT;  Laterality: N/A;  . RIGID ESOPHAGOSCOPY N/A 10/20/2019   Procedure: RIGID ESOPHAGOSCOPY;  Surgeon: Melida Quitter, MD;  Location: [facility];  Service: ENT;  Laterality: N/A;  . THYROIDECTOMY    . TUMMY TUCK      There were no vitals filed for this visit.   Subjective Assessment - 08/16/20 1456    Subjective "I asked my dentist - I'm so sore just right under my (left) ear. Do you know why? I forgot to tell Dr. Isidore Moos."    Currently in Pain? No/denies    Pain Score 5    "If I press it."   Pain Location --   just posterior to ramus of lt mandible   Pain Orientation Left    Pain Descriptors / Indicators Sore    Pain Type Acute pain    Pain Onset More than a month ago   "at least 4 weeks"  Pain Frequency Intermittent    Aggravating Factors  pressing on the area/spot                 ADULT SLP TREATMENT - 08/16/20 1503      General Information   Behavior/Cognition Alert;Cooperative;Pleasant mood      Treatment Provided   Treatment provided Dysphagia      Dysphagia Treatment   Temperature Spikes Noted No    Respiratory Status Room air    Treatment Methods Skilled observation;Therapeutic exercise;Patient/caregiver education    Oral Phase Signs & Symptoms --   none noted   Pharyngeal Phase Signs & Symptoms Immediate throat clear;Other (comment)   with 1 teaspoon water trials x4; no overt s/sx aspiration PNA   Other treatment/comments "It's not getting any worse - it's been the same."(re: pain in lt lateral neck near ramus of mandible). "When I accidentally take a big swig of water I know it's going to not go." - pt coughs with liquids most often -  pt is still on thickener "a little thicker than water." No throat clear/cough with solids (cereal bar). Pt without throat clearing with 1/4 and 1/2 teaspoon water, but cleared throat and had cough x1 with a teaspoon of water. Pt without overt s/sx aspiraiton PNA today.      Assessment / Recommendations / Plan   Plan --   modified (MBSS) recommended- pt agrees           SLP Education - 08/17/20 1000    Education Details rationale for MBSS recommendation    Person(s) Educated Patient    Methods Explanation    Comprehension Verbalized understanding            SLP Short Term Goals - 04/27/20 2350      SLP SHORT TERM GOAL #1   Title Pt will tell SLP rationale for HEP completion    Period --   visit, for all STGs   Status Achieved      SLP SHORT TERM GOAL #2   Title Pt will complete HEP with occasional min A over two sessions    Baseline 03-11-20    Status Achieved      SLP SHORT TERM GOAL #3   Title pt will tell SLP 3 overt s/sx aspiration PNA    Status Achieved      SLP SHORT TERM GOAL #4   Title Pt will tell SLP how a food journal can hasten/facilitate return to more normalized diet    Status Deferred            SLP Long Term Goals - 08/17/20 1022      SLP LONG TERM GOAL #1   Title Pt will complete HEP with rare min A over 3 visits    Baseline 04-27-20, 06-02-20    Time 1    Period --   visits, for all LTGs   Status On-going      SLP LONG TERM GOAL #2   Title Pt will tell SLP when to decr frequency of HEP    Time 1    Status On-going      SLP LONG TERM GOAL #3   Title pt will tell SLP how to incr difficulty of HEP, with min A    Time 2    Status On-going            Plan - 08/17/20 1001    Clinical Impression Statement Pt presents today with WFL/WNL swallowing ability with dys III item (cereal bar)  and tiny (1/2 teaspoon) sips of thin water. Anita Pollard continues to slightly thicken all liquids with powdered thickener. However she had consistent evidence of  pharyngeal difficulty with sips of water measuring ~1 teaspoon in size. Pt thinks this difficulty is more frequent than previously (even with mildly thickened fluids) but states she could be paying less attention when drinking.  A modified barium swallow (MBSS) is recommended. No overt s/sx aspiration PNA reported or observed today. Data suggests that as pts progress through rad or chemorad therapy that their swallowing ability will decrease. Also, WNL swallowing is threatened by muscle fibrosis that will likely develop after rad/chemorad is completed. Pt reports she is maintaining her frequency with her HEP. Skilled ST would be beneficial to the pt in order to regularly assess pt's safety with POs and/or need for instrumental swallow assessment, as well as to assess accurate completion of swallowing HEP. Pt agrees to once every other month.    Speech Therapy Frequency --   once approx every 8 weeks   Duration --   7 total sessions   Treatment/Interventions Aspiration precaution training;Pharyngeal strengthening exercises;Compensatory techniques;Diet toleration management by SLP;Trials of upgraded texture/liquids;Cognitive reorganization;Internal/external aids;Patient/family education;SLP instruction and feedback    Potential to [place] provided today    Consulted and Agree with Plan of Care Patient           Patient will benefit from skilled therapeutic intervention in order to improve the following deficits and impairments:   Dysphagia, unspecified type    Problem List Patient Active Problem List   Diagnosis Date Noted  . Malignant neoplasm of supraglottis ([place]) 01/21/2020  . Pelvic mass in female 06/05/2013  . DM2 (diabetes mellitus, type 2) ([place]) 09/24/2012  . AKI (acute kidney injury) ([place]) 09/24/2012  . Elevated serum creatinine 09/03/2012  . Anemia, unspecified 09/03/2012  . Breast cancer ([place]) 08/31/2011  . HYPERTENSION, BENIGN 02/28/2010  .  Carotid artery disease ([place]) 01/26/2010    [facility] ,[place], [place]  08/17/2020, 10:23 AM  [facility] [address] [place], [place], [address] Phone: [phone]   Fax:  [phone]   Name: Anita Pollard MRN: [id number] Date of Birth: [birth year]

## 2020-08-24 ENCOUNTER — Ambulatory Visit (HOSPITAL_COMMUNITY)
Admission: RE | Admit: 2020-08-24 | Discharge: 2020-08-24 | Disposition: A | Discharge status: Home / Self Care | Payer: Medicare Other | Source: Ambulatory Visit | Attending: Radiation Oncology | Admitting: Radiation Oncology

## 2020-08-24 ENCOUNTER — Other Ambulatory Visit: Payer: Self-pay

## 2020-08-24 DIAGNOSIS — R1310 Dysphagia, unspecified: Secondary | ICD-10-CM

## 2020-08-24 DIAGNOSIS — C321 Malignant neoplasm of supraglottis: Secondary | ICD-10-CM | POA: Diagnosis present

## 2020-08-30 ENCOUNTER — Other Ambulatory Visit: Payer: Self-pay

## 2020-08-30 DIAGNOSIS — Z79899 Other long term (current) drug therapy: Secondary | ICD-10-CM

## 2020-08-30 DIAGNOSIS — R600 Localized edema: Secondary | ICD-10-CM

## 2020-08-30 DIAGNOSIS — C140 Malignant neoplasm of pharynx, unspecified: Secondary | ICD-10-CM

## 2020-08-30 DIAGNOSIS — Z9221 Personal history of antineoplastic chemotherapy: Secondary | ICD-10-CM

## 2020-08-30 MED ORDER — FUROSEMIDE 40 MG PO TABS: 40.0000 mg | ORAL_TABLET | Freq: Every day | ORAL | 0 refills | Status: DC

## 2020-09-01 ENCOUNTER — Telehealth: Payer: Self-pay | Admitting: Cardiovascular Disease

## 2020-09-01 DIAGNOSIS — Z9221 Personal history of antineoplastic chemotherapy: Secondary | ICD-10-CM

## 2020-09-01 DIAGNOSIS — Z79899 Other long term (current) drug therapy: Secondary | ICD-10-CM

## 2020-09-01 DIAGNOSIS — C140 Malignant neoplasm of pharynx, unspecified: Secondary | ICD-10-CM

## 2020-09-01 DIAGNOSIS — R600 Localized edema: Secondary | ICD-10-CM

## 2020-09-01 MED ORDER — FUROSEMIDE 40 MG PO TABS: 40.0000 mg | ORAL_TABLET | Freq: Every day | ORAL | 0 refills | Status: DC

## 2020-09-01 NOTE — Telephone Encounter (Signed)
*  STAT* If patient is at the pharmacy, call can be transferred to refill team.   1. Which medications need to be refilled? (please list name of each medication and dose if known) furosemide (LASIX) 40 MG tablet  2. Which pharmacy/location (including street and city if local pharmacy) is medication to be sent to? [place], [place]  3. Do they need a 30 day or 90 day supply? 90 day supply    The patient is all out of this medication

## 2020-09-01 NOTE — Telephone Encounter (Signed)
Patient to see Dr. Oval Linsey in September. Per last OV with Dr. Meda Coffee patient on Lasix 40 mg Daily. Refill sent in for 90 day supply until appointment with Dr. Oval Linsey.   Patient aware and verbalized understanding.

## 2020-09-13 ENCOUNTER — Other Ambulatory Visit: Payer: Self-pay

## 2020-09-13 ENCOUNTER — Ambulatory Visit: Payer: Medicare Other | Attending: Radiation Oncology

## 2020-09-13 DIAGNOSIS — R1310 Dysphagia, unspecified: Secondary | ICD-10-CM | POA: Insufficient documentation

## 2020-09-13 NOTE — Addendum Note (Signed)
Addended by: Garald Balding B on: 09/13/2020 04:30 PM   Modules accepted: Orders

## 2020-09-13 NOTE — Therapy (Signed)
[facility] [address] [place], [place], [address] Phone: [phone]   Fax:  [phone]  Speech Language Pathology Treatment/Renewal Summary  Patient Details  Name: Anita Pollard MRN: [id number] Date of Birth: Sep 08, 1940 Referring Provider (SLP): Eppie Gibson, MD   Encounter Date: 09/13/2020   End of Session - 09/13/20 1613     Visit Number 6    Number of Visits 8    Date for SLP Re-Evaluation 12/12/20   90 days   SLP Start Time 0998    SLP Stop Time  1400    SLP Time Calculation (min) 42 min    Activity Tolerance Patient tolerated treatment well             Past Medical History:  Diagnosis Date   Alcohol abuse    Anemia    Arthritis    Breast cancer ([place]) 2010   Right   Chronic kidney disease    Colon polyp    Depression    Diabetes mellitus    GERD (gastroesophageal reflux disease)    Heart murmur    History of kidney stones 1976   HTN (hypertension)    Hypothyroidism    Osteopenia    Pancreatitis    Ulcerative esophagitis     Past Surgical History:  Procedure Laterality Date   ABDOMINAL HYSTERECTOMY     ABDOMINOPLASTY     APPENDECTOMY     BREAST BIOPSY Right 08/03/2008   Stereo Bx, Malignant   BREAST LUMPECTOMY Right 08/26/2008   CATARACT EXTRACTION W/ INTRAOCULAR LENS  IMPLANT, BILATERAL     CO2 LASER APPLICATION N/A 33/82/5053   Procedure: CO2 LASER APPLICATION;  Surgeon: Melida Quitter, MD;  Location: [facility];  Service: ENT;  Laterality: N/A;   COSMETIC SURGERY     ductal carcinoma     right breast    EYE SURGERY     HIP ARTHROPLASTY Right    INSERTION OF DIALYSIS CATHETER Right 09/25/2012   Procedure: INSERTION OF DIALYSIS CATHETER;  Surgeon: Mal Misty, MD;  Location: [facility];  Service: Vascular;  Laterality: Right;  Righ Internal Jugular Placement   IR GASTROSTOMY TUBE MOD SED  02/10/2020   IR GASTROSTOMY TUBE REMOVAL  05/25/2020   JOINT REPLACEMENT     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          ADULT SLP TREATMENT - 09/13/20 1554       General Information   Behavior/Cognition Alert;Cooperative;Pleasant mood      Treatment Provided   Treatment provided Dysphagia      Dysphagia Treatment   Temperature Spikes Noted No    Respiratory Status Room air    Treatment Methods Skilled observation;Therapeutic exercise;Compensation strategy training;Patient/caregiver education    Patient observed directly with PO's Yes    Type of PO's observed Thin liquids    Liquids provided via Cup    Oral Phase Signs &  Symptoms Other (comment)   no difficulty noted today   Pharyngeal Phase Signs & Symptoms Other (comment)   none noted today   Other treatment/comments Pt with MBSS 08-24-20, with results not unlike her previous MBSS in 2021. No aspiration nor penetration. Pt cont to report globus in throat. SLP explained to pt oculd be GERD, could be epiglottic edema seen on MBSS. Pt stated she saw ENT who stated she "was still recovering from that rad tx." (pt did not elaborate any more than this). Pt expresses concern about couging more frequently wiht her AM tea (thin) than other liquids. When she thickens tea, pt was unsure of cough frequency same or different than with thin, unthickened. Pt also unsure of if she is slurping hot tea in AMs as opposed to introduction of WNL temperature liquid bolus into oral cavity. SLP  reiterated morning tea must be consumed with care. Pt with many questions regarding her xerostomia - she reports it is more severe than at end of rad tx. SLP told pt should expect saliva to improve over time although not return to pre-rad tx levels, adn suggested possibilities of pt med changes, environmental/seasonal change (less-humid air indoors, more perspiration in summer, etc reducing fluid in body). Pt should talk with endocrinologist and/or nephrologist re: this. Pt has self-d/c'd supersupraglottic swallow due to "(her) digestive tract", and req'd mod cues for [facility]. She states she is completing pitch raise 4-5 sets per day due to globus sensation and this seemingly assisting this feeling. SLP asked pt to consider doing more sets of Mendelsohn and effortful swallows instead, based upon MBSS recommendation.      Assessment / Recommendations / Plan   Plan Continue with current plan of care      Dysphagia Recommendations   Diet recommendations Nectar-thick liquid;Thin liquid   solids as tolerated   Liquids provided via Cup    Compensations Slow rate;Small sips/bites      Progression Toward Goals   Progression toward goals --   pt performing scope of HEP as she wishes             SLP Education - 09/13/20 1613     Education Details Caryl Ada procedure    Person(s) Educated Patient    Methods Explanation;Demonstration;Verbal cues;Handout    Comprehension Verbalized understanding;Returned demonstration;Verbal cues required              SLP Short Term Goals - 04/27/20 2350       SLP SHORT TERM GOAL #1   Title Pt will tell SLP rationale for HEP completion    Period --   visit, for all STGs   Status Achieved      SLP SHORT TERM GOAL #2   Title Pt will complete HEP with occasional min A over two sessions    Baseline 03-11-20    Status Achieved      SLP SHORT TERM GOAL #3   Title pt will tell SLP 3 overt s/sx aspiration PNA    Status Achieved      SLP SHORT TERM  GOAL #4   Title Pt will tell SLP how a food journal can hasten/facilitate return to more normalized diet    Status Deferred              SLP Long Term Goals - 09/13/20 1617       SLP LONG TERM GOAL #1   Title Pt will complete HEP with rare min A over 3 visits    Baseline 04-27-20, 06-02-20  Time 2    Period --   visits over 90 days, for all LTGs   Status On-going      SLP LONG TERM GOAL #2   Title Pt will tell SLP when to decr frequency of HEP    Time 2    Status On-going      SLP LONG TERM GOAL #3   Title pt will tell SLP how to incr difficulty of HEP, with min A    Time 2    Status On-going              Plan - 09/13/20 1613     Clinical Impression Statement Pt presents today with WFL/WNL swallowing ability with sips of thin water. [place] continues to slightly thicken all liquids with powdered thickener, but only sometimes thickens hot tea in AM - she states she coughs more with tea than with other liquids during the day which are mostly always thickened slightly (between thin/nectar).  A modified barium swallow (MBSS) was completed on 08-24-20 - see report for details under "imaging" - no aspiration seen, only penetration was with pill and thin. Mild aspiration risk.Decreased laryngeal closure and decr'd epiglottic deflection noted - suggested keying in on exercises promoting/strengthening these areas. No overt s/sx aspiration PNA reported or observed today. Data suggests that as pts progress through rad or chemorad therapy that their swallowing ability will decrease. Also, WNL swallowing is threatened by muscle fibrosis that will likely develop after rad/chemorad is completed. Pt reports she is maintaining her frequency with her HEP. Skilled ST would be cont to be beneficial to the pt in order to regularly assess pt's safety with POs and/or need for instrumental swallow assessment, as well as to assess accurate completion of swallowing HEP. Pt agrees to once every other month.     Speech Therapy Frequency --   once approx every 8 weeks   Duration --   7 total sessions   Treatment/Interventions Aspiration precaution training;Pharyngeal strengthening exercises;Compensatory techniques;Diet toleration management by SLP;Trials of upgraded texture/liquids;Cognitive reorganization;Internal/external aids;Patient/family education;SLP instruction and feedback    Potential to [place] provided today    Consulted and Agree with Plan of Care Patient             Patient will benefit from skilled therapeutic intervention in order to improve the following deficits and impairments:   Dysphagia, unspecified type    Problem List Patient Active Problem List   Diagnosis Date Noted   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

## 2020-09-15 ENCOUNTER — Other Ambulatory Visit: Payer: Self-pay

## 2020-09-16 ENCOUNTER — Other Ambulatory Visit: Payer: Self-pay

## 2020-09-16 DIAGNOSIS — D649 Anemia, unspecified: Secondary | ICD-10-CM

## 2020-09-23 ENCOUNTER — Telehealth: Payer: Self-pay | Admitting: Hematology and Oncology

## 2020-09-23 NOTE — Telephone Encounter (Signed)
Received a new hem referral from Dr. Isidore Moos for anemia. Anita Pollard has been cld and scheduled to see Dr. Chryl Heck on 7/5 at 1:40pm. Pt aware to arrive 15 minutes early. Pt stated that she has to get her labs drawn with her kidney doctor earlier that day. I asked that she have them faxed to our office.

## 2020-09-28 ENCOUNTER — Inpatient Hospital Stay: Payer: Medicare Other

## 2020-09-28 ENCOUNTER — Inpatient Hospital Stay: Payer: Medicare Other | Attending: Hematology and Oncology | Admitting: Hematology and Oncology

## 2020-09-28 ENCOUNTER — Other Ambulatory Visit: Payer: Self-pay

## 2020-09-28 VITALS — BP 161/66 | HR 79 | Temp 97.9°F | Resp 17 | Ht 61.0 in | Wt 123.1 lb

## 2020-09-28 DIAGNOSIS — R634 Abnormal weight loss: Secondary | ICD-10-CM

## 2020-09-28 DIAGNOSIS — Z853 Personal history of malignant neoplasm of breast: Secondary | ICD-10-CM

## 2020-09-28 DIAGNOSIS — E119 Type 2 diabetes mellitus without complications: Secondary | ICD-10-CM

## 2020-09-28 DIAGNOSIS — R231 Pallor: Secondary | ICD-10-CM | POA: Insufficient documentation

## 2020-09-28 DIAGNOSIS — Z87442 Personal history of urinary calculi: Secondary | ICD-10-CM

## 2020-09-28 DIAGNOSIS — Z940 Kidney transplant status: Secondary | ICD-10-CM | POA: Diagnosis not present

## 2020-09-28 DIAGNOSIS — Z8249 Family history of ischemic heart disease and other diseases of the circulatory system: Secondary | ICD-10-CM | POA: Diagnosis not present

## 2020-09-28 DIAGNOSIS — Z888 Allergy status to other drugs, medicaments and biological substances status: Secondary | ICD-10-CM | POA: Insufficient documentation

## 2020-09-28 DIAGNOSIS — D649 Anemia, unspecified: Secondary | ICD-10-CM

## 2020-09-28 DIAGNOSIS — R630 Anorexia: Secondary | ICD-10-CM | POA: Diagnosis not present

## 2020-09-28 DIAGNOSIS — Z79899 Other long term (current) drug therapy: Secondary | ICD-10-CM | POA: Diagnosis not present

## 2020-09-28 DIAGNOSIS — Z9049 Acquired absence of other specified parts of digestive tract: Secondary | ICD-10-CM | POA: Diagnosis not present

## 2020-09-28 DIAGNOSIS — Z818 Family history of other mental and behavioral disorders: Secondary | ICD-10-CM | POA: Insufficient documentation

## 2020-09-28 DIAGNOSIS — Z808 Family history of malignant neoplasm of other organs or systems: Secondary | ICD-10-CM | POA: Diagnosis not present

## 2020-09-28 DIAGNOSIS — I10 Essential (primary) hypertension: Secondary | ICD-10-CM | POA: Diagnosis not present

## 2020-09-28 DIAGNOSIS — R5383 Other fatigue: Secondary | ICD-10-CM | POA: Diagnosis not present

## 2020-09-28 DIAGNOSIS — Z8719 Personal history of other diseases of the digestive system: Secondary | ICD-10-CM | POA: Diagnosis not present

## 2020-09-28 DIAGNOSIS — Z7952 Long term (current) use of systemic steroids: Secondary | ICD-10-CM

## 2020-09-28 DIAGNOSIS — E538 Deficiency of other specified B group vitamins: Secondary | ICD-10-CM

## 2020-09-28 DIAGNOSIS — Z880 Allergy status to penicillin: Secondary | ICD-10-CM

## 2020-09-28 DIAGNOSIS — Z87891 Personal history of nicotine dependence: Secondary | ICD-10-CM

## 2020-09-28 DIAGNOSIS — Z833 Family history of diabetes mellitus: Secondary | ICD-10-CM

## 2020-09-28 DIAGNOSIS — D84821 Immunodeficiency due to drugs: Secondary | ICD-10-CM | POA: Diagnosis not present

## 2020-09-28 DIAGNOSIS — Z8521 Personal history of malignant neoplasm of larynx: Secondary | ICD-10-CM | POA: Diagnosis not present

## 2020-09-28 DIAGNOSIS — D519 Vitamin B12 deficiency anemia, unspecified: Secondary | ICD-10-CM

## 2020-09-28 LAB — CBC WITH DIFFERENTIAL/PLATELET
Abs Immature Granulocytes: 0.03 10*3/uL (ref 0.00–0.07)
Basophils Absolute: 0 10*3/uL (ref 0.0–0.1)
Basophils Relative: 0 %
Eosinophils Absolute: 0 10*3/uL (ref 0.0–0.5)
Eosinophils Relative: 0 %
HCT: 30.3 % — ABNORMAL LOW (ref 36.0–46.0)
Hemoglobin: 10 g/dL — ABNORMAL LOW (ref 12.0–15.0)
Immature Granulocytes: 0 %
Lymphocytes Relative: 13 %
Lymphs Abs: 1 10*3/uL (ref 0.7–4.0)
MCH: 31.2 pg (ref 26.0–34.0)
MCHC: 33 g/dL (ref 30.0–36.0)
MCV: 94.4 fL (ref 80.0–100.0)
Monocytes Absolute: 0.4 10*3/uL (ref 0.1–1.0)
Monocytes Relative: 5 %
Neutro Abs: 6.1 10*3/uL (ref 1.7–7.7)
Neutrophils Relative %: 82 %
Platelets: 178 10*3/uL (ref 150–400)
RBC: 3.21 MIL/uL — ABNORMAL LOW (ref 3.87–5.11)
RDW: 13.5 % (ref 11.5–15.5)
WBC: 7.5 10*3/uL (ref 4.0–10.5)
nRBC: 0 % (ref 0.0–0.2)

## 2020-09-28 LAB — CMP (CANCER CENTER ONLY)
ALT: 15 U/L (ref 0–44)
AST: 15 U/L (ref 15–41)
Albumin: 3.9 g/dL (ref 3.5–5.0)
Alkaline Phosphatase: 111 U/L (ref 38–126)
Anion gap: 11 (ref 5–15)
BUN: 43 mg/dL — ABNORMAL HIGH (ref 8–23)
CO2: 22 mmol/L (ref 22–32)
Calcium: 9 mg/dL (ref 8.9–10.3)
Chloride: 106 mmol/L (ref 98–111)
Creatinine: 1.67 mg/dL — ABNORMAL HIGH (ref 0.44–1.00)
GFR, Estimated: 30 mL/min — ABNORMAL LOW (ref >60)
Glucose, Bld: 308 mg/dL — ABNORMAL HIGH (ref 70–99)
Potassium: 4 mmol/L (ref 3.5–5.1)
Sodium: 139 mmol/L (ref 135–145)
Total Bilirubin: 0.3 mg/dL (ref 0.3–1.2)
Total Protein: 6.9 g/dL (ref 6.5–8.1)

## 2020-09-28 LAB — RETICULOCYTES
Immature Retic Fract: 12.4 % (ref 2.3–15.9)
RBC.: 3.22 MIL/uL — ABNORMAL LOW (ref 3.87–5.11)
Retic Count, Absolute: 55.7 10*3/uL (ref 19.0–186.0)
Retic Ct Pct: 1.7 % (ref 0.4–3.1)

## 2020-09-28 LAB — VITAMIN B12: Vitamin B-12: 776 pg/mL (ref 180–914)

## 2020-09-28 LAB — LACTATE DEHYDROGENASE: LDH: 197 U/L — ABNORMAL HIGH (ref 98–192)

## 2020-09-28 NOTE — Progress Notes (Signed)
[facility] NOTE  Patient Care Team: Lawerance Cruel, MD as PCP - General (Family Medicine) Lawerance Cruel, MD as Attending Physician (Family Medicine) Eppie Gibson, MD as Consulting Physician (Radiation Oncology) Malmfelt, Stephani Police, RN as Oncology Nurse Navigator  CHIEF COMPLAINTS/PURPOSE OF CONSULTATION:  Anemia.  ASSESSMENT & PLAN:   Normocytic normochromic anemia This is a very pleasant 80 year old female patient with past medical history significant for hypertension, diabetes, breast cancer, malignant neoplasm of the supraglottis, kidney transplant currently on Prograf referred to hematology for evaluation of progressive anemia.  Patient denies any new symptoms except for feeling tired and some loss of appetite and loss of weight which she attributes to the recent radiation.  She did notice some improvement in the past couple weeks. Physical examination unremarkable, healthy appearing 80 year old female patient, mild pallor noted. We have reviewed her labs for several years, she does have normocytic normochromic anemia which is slowly progressed in linear relationship with her worsening GFR.  She also has a ferritin of 22 hence have recommended oral iron supplementation ferrous sulfate 65 mg once a day if she can tolerate it. We have also ordered some SPEP, awaiting test results.  No evidence of hemolysis. I do not believe she is a very good candidate for Retacrit at this time since her hemoglobin is about 10 g and also given her history of Solid tumors. I have encouraged her to follow-up with her nephrologist given her transplant history and following GFR which is concerning.  In the meantime I have recommended should she continue oral iron supplementation and return to clinic in 6 weeks.  If there is any evidence of monoclonal protein, we will recommend a sooner follow-up for additional testing.  No evidence of W96 or folic acid deficiency.    Orders  Placed This Encounter  Procedures   CBC with Differential/Platelet    Standing Status:   Standing    Number of Occurrences:   22    Standing Expiration Date:   09/28/2021   Folate RBC    Standing Status:   Future    Number of Occurrences:   1    Standing Expiration Date:   09/28/2021   SPEP with reflex to IFE    Standing Status:   Future    Number of Occurrences:   1    Standing Expiration Date:   09/28/2021   Kappa/lambda light chains    Standing Status:   Future    Number of Occurrences:   1    Standing Expiration Date:   09/28/2021   IgG, IgA, IgM    Standing Status:   Future    Number of Occurrences:   1    Standing Expiration Date:   09/28/2021   CMP ([facility] only)    Standing Status:   Future    Number of Occurrences:   1    Standing Expiration Date:   09/28/2021   Lactate dehydrogenase    Standing Status:   Future    Number of Occurrences:   1    Standing Expiration Date:   09/28/2021   Reticulocytes    Standing Status:   Future    Number of Occurrences:   1    Standing Expiration Date:   09/28/2021   Vitamin B12    Standing Status:   Future    Number of Occurrences:   1    Standing Expiration Date:   09/28/2021     HISTORY OF PRESENTING  ILLNESS:  Anita Pollard 80 y.o. female is here because of anemia.  This is a very pleasant 80 year old female patient with past medical history significant for right-sided breast cancer, cancer of the supraglottis status postradiation, type 2 diabetes, hypertension, kidney transplant referred to hematology for evaluation of slowly progressive anemia. Patient tells me that she feels well although she was pretty tired from radiation.  She started noticing more energy and appetite in the past 2 weeks.  She tells me that she has had anemia for a long time.  She has not ever tried iron supplementation.  Her nephrologist is also closely monitoring her because of drop in GFR since she has transplanted kidney.  She is currently on Prograf for  immunosuppression.  She denies any bleeding in her stool or in her urine. Rest of the pertinent 10 point ROS reviewed and negative.  REVIEW OF SYSTEMS:   Constitutional: Denies fevers, chills or abnormal night sweats Eyes: Denies blurriness of vision, double vision or watery eyes Ears, nose, mouth, throat, and face: Denies mucositis or sore throat Respiratory: Denies cough, dyspnea or wheezes Cardiovascular: Denies palpitation, chest discomfort or lower extremity swelling Gastrointestinal:  Denies nausea, heartburn or change in bowel habits Skin: Denies abnormal skin rashes Lymphatics: Denies new lymphadenopathy or easy bruising Neurological:Denies numbness, tingling or new weaknesses Behavioral/Psych: Mood is stable, no new changes  All other systems were reviewed with the patient and are negative.  MEDICAL HISTORY:  Past Medical History:  Diagnosis Date   Alcohol abuse    Anemia    Arthritis    Breast cancer ([facility]) 2010   Right   Chronic kidney disease    Colon polyp    Depression    Diabetes mellitus    GERD (gastroesophageal reflux disease)    Heart murmur    History of kidney stones 1976   HTN (hypertension)    Hypothyroidism    Osteopenia    Pancreatitis    Ulcerative esophagitis     SURGICAL HISTORY: Past Surgical History:  Procedure Laterality Date   ABDOMINAL HYSTERECTOMY     ABDOMINOPLASTY     APPENDECTOMY     BREAST BIOPSY Right 08/03/2008   Stereo Bx, Malignant   BREAST LUMPECTOMY Right 08/26/2008   CATARACT EXTRACTION W/ INTRAOCULAR LENS  IMPLANT, BILATERAL     CO2 LASER APPLICATION N/A 12/45/8099   Procedure: CO2 LASER APPLICATION;  Surgeon: Melida Quitter, MD;  Location: [facility];  Service: ENT;  Laterality: N/A;   COSMETIC SURGERY     ductal carcinoma     right breast    EYE SURGERY     HIP ARTHROPLASTY Right    INSERTION OF DIALYSIS CATHETER Right 09/25/2012   Procedure: INSERTION OF DIALYSIS CATHETER;  Surgeon: Mal Misty, MD;  Location: [facility];   Service: Vascular;  Laterality: Right;  Righ Internal Jugular Placement   IR GASTROSTOMY TUBE MOD SED  02/10/2020   IR GASTROSTOMY TUBE REMOVAL  05/25/2020   JOINT REPLACEMENT     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   MICROLARYNGOSCOPY N/A 10/20/2019   Procedure: Suspended Microlaryngoscopy with Biopsy;  Surgeon: Melida Quitter, MD;  Location: [facility];  Service: ENT;  Laterality: N/A;   MICROLARYNGOSCOPY N/A 01/09/2020   Procedure: MICROLARYNGOSCOPY WITH BIOPSY;  Surgeon: Melida Quitter, MD;  Location: [facility];  Service: ENT;  Laterality: N/A;   RIGID ESOPHAGOSCOPY N/A 10/20/2019   Procedure: RIGID ESOPHAGOSCOPY;  Surgeon: Melida Quitter, MD;  Location: [facility];  Service: ENT;  Laterality: N/A;  THYROIDECTOMY     TUMMY TUCK      SOCIAL HISTORY: Social History   Socioeconomic History   Marital status: Widowed    Spouse name: Not on file   Number of children: Not on file   Years of education: Not on file   Highest education level: Not on file  Occupational History   Not on file  Tobacco Use   Smoking status: Former    Pack years: 0.00    Types: Cigarettes    Quit date: 09/25/1983    Years since quitting: 37.0   Smokeless tobacco: Never  Vaping Use   Vaping Use: Never used  Substance and Sexual Activity   Alcohol use: Yes    Comment: socially history of heavy drinking with intervention x 2-at least 2 occasions since april '14   Drug use: No   Sexual activity: Never  Other Topics Concern   Not on file  Social History Narrative   Not on file   Social Determinants of Health   Financial Resource Strain: Low Risk    Difficulty of Paying Living Expenses: Not hard at all  Food Insecurity: No Food Insecurity   Worried About Charity fundraiser in the Last Year: Never true   [place] in the Last Year: Never true  Transportation Needs: No Transportation Needs   Lack of Transportation (Medical): No   Lack of Transportation (Non-Medical): No  Physical Activity: Insufficiently  Active   Days of Exercise per Week: 5 days   Minutes of Exercise per Session: 20 min  Stress: Not on file  Social Connections: Moderately Isolated   Frequency of Communication with Friends and Family: More than three times a week   Frequency of Social Gatherings with Friends and Family: Never   Attends Religious Services: Never   Marine scientist or Organizations: Yes   Attends Archivist Meetings: Never   Marital Status: Widowed  Human resources officer Violence: Not on file    FAMILY HISTORY: Family History  Problem Relation Age of Onset   Thyroid cancer Daughter 66   Diabetes Father    Hypertension Father    Diabetes Brother    Hypertension Mother    Hypertension Sister    Colon polyps Brother    Atrial fibrillation Brother    Dementia Sister    Multiple myeloma Sister     ALLERGIES:  is allergic to penicillins, banana, detrol [tolterodine], quetiapine, risperidone and related, and lisinopril.  MEDICATIONS:  Current Outpatient Medications  Medication Sig Dispense Refill   amLODipine (NORVASC) 10 MG tablet Take 10 mg by mouth at bedtime.     Cholecalciferol (VITAMIN D3) 5000 UNITS TABS Take 5,000 Units by mouth 3 (three) times daily after meals.      furosemide (LASIX) 40 MG tablet Take 1 tablet (40 mg total) by mouth daily. Please keep appointment with Dr. Oval Linsey in September for future refills. 90 tablet 0   glipiZIDE (GLUCOTROL XL) 2.5 MG 24 hr tablet Take 5 mg by mouth 2 (two) times daily.      insulin glargine, 1 Unit Dial, (TOUJEO SOLOSTAR) 300 UNIT/ML Solostar Pen 10 Units.     Lancets (FREESTYLE) lancets by Does not apply route.     levothyroxine (SYNTHROID) 200 MCG tablet Take 200 mcg by mouth daily before breakfast.      losartan (COZAAR) 25 MG tablet Take 25 mg by mouth at bedtime.      Melatonin 5 MG CAPS Take 5 mg by  mouth See admin instructions. Take 5 mg 2 hours before bedtime     omeprazole (PRILOSEC) 20 MG capsule Take 20 mg by mouth daily.      Pancrelipase, Lip-Prot-Amyl, (CREON) 3000-9500 units CPEP Take 1 capsule by mouth 3 (three) times daily with meals.      Polyethyl Glycol-Propyl Glycol (SYSTANE OP) Place 1 drop into both eyes every evening.     pramipexole (MIRAPEX) 0.25 MG tablet Take 0.25-0.5 mg by mouth See admin instructions. Take 0.5 mg 2 hours prior to bedtime then 0.25 mg at bedtime     raloxifene (EVISTA) 60 MG tablet Take 60 mg by mouth daily.     tacrolimus (PROGRAF) 0.5 MG capsule Take 0.5 mg by mouth every 12 (twelve) hours.      tacrolimus (PROGRAF) 1 MG capsule Take 1 mg by mouth every 12 (twelve) hours.      temazepam (RESTORIL) 15 MG capsule Take 15-30 mg by mouth at bedtime.      vitamin B-12 (CYANOCOBALAMIN) 500 MCG tablet Take 500 mcg by mouth daily.     ondansetron (ZOFRAN) 4 MG tablet Take 4 mg by mouth every 8 (eight) hours as needed for nausea or vomiting.      potassium chloride (MICRO-K) 10 MEQ CR capsule Take 10-20 mEq by mouth See admin instructions. Take 20 meq by mouth daily on Sunday, Tuesday, Thursday and Saturday, take 10 meq on Monday, Wednesday and Friday     predniSONE (DELTASONE) 10 MG tablet Take 10 mg by mouth daily with breakfast.      No current facility-administered medications for this visit.     PHYSICAL EXAMINATION:  ECOG PERFORMANCE STATUS: 0 - Asymptomatic  Vitals:   09/28/20 1344  BP: (!) 161/66  Pulse: 79  Resp: 17  Temp: 97.9 F (36.6 C)  SpO2: 99%   Filed Weights   09/28/20 1344  Weight: 123 lb 1.6 oz (55.8 kg)    GENERAL:alert, no distress and comfortable SKIN: skin color, texture, turgor are normal, no rashes or significant lesions EYES: normal, conjunctiva are pink and non-injected, sclera clear OROPHARYNX:no exudate, no erythema and lips, buccal mucosa, and tongue normal  NECK: supple, thyroid normal size, non-tender, without nodularity LYMPH:  no palpable lymphadenopathy in the cervical, axillary or inguinal LUNGS: clear to auscultation and percussion  with normal breathing effort HEART: regular rate & rhythm and no murmurs and no lower extremity edema ABDOMEN:abdomen soft, non-tender and normal bowel sounds Musculoskeletal:no cyanosis of digits and no clubbing  PSYCH: alert & oriented x 3 with fluent speech NEURO: no focal motor/sensory deficits  LABORATORY DATA:  I have reviewed the data as listed Lab Results  Component Value Date   WBC 7.5 09/28/2020   HGB 10.0 (L) 09/28/2020   HCT 30.4 (L) 09/28/2020   HCT 30.3 (L) 09/28/2020   MCV 94.4 09/28/2020   PLT 178 09/28/2020     Chemistry      Component Value Date/Time   NA 139 09/28/2020 1432   NA 137 03/22/2020 1439   NA 140 08/28/2013 0955   K 4.0 09/28/2020 1432   K 3.0 (LL) 08/28/2013 0955   CL 106 09/28/2020 1432   CL 111 (H) 09/03/2012 1249   CO2 22 09/28/2020 1432   CO2 16 (L) 08/28/2013 0955   BUN 43 (H) 09/28/2020 1432   BUN 42 (H) 03/22/2020 1439   BUN 75.7 (H) 08/28/2013 0955   CREATININE 1.67 (H) 09/28/2020 1432   CREATININE 5.9 (HH) 08/28/2013 0100  Component Value Date/Time   CALCIUM 9.0 09/28/2020 1432   CALCIUM 7.5 (L) 08/28/2013 0955   ALKPHOS 111 09/28/2020 1432   ALKPHOS 62 08/28/2013 0955   AST 15 09/28/2020 1432   AST 15 08/28/2013 0955   ALT 15 09/28/2020 1432   ALT 17 08/28/2013 0955   BILITOT 0.3 09/28/2020 1432   BILITOT 0.38 08/28/2013 0955       RADIOGRAPHIC STUDIES: I have personally reviewed the radiological images as listed and agreed with the findings in the report. No results found.  All questions were answered. The patient knows to call the clinic with any problems, questions or concerns. I spent 45 minutes in the care of this patient including H and P, review of records, counseling and coordination of care.     Benay Pike, MD 09/29/2020 3:54 PM

## 2020-09-29 ENCOUNTER — Encounter: Payer: Self-pay | Admitting: Hematology and Oncology

## 2020-09-29 LAB — FOLATE RBC
Folate, Hemolysate: >620 ng/mL
Folate, RBC: >2039 ng/mL (ref >498)
Hematocrit: 30.4 % — ABNORMAL LOW (ref 34.0–46.6)

## 2020-09-29 LAB — PROTEIN ELECTROPHORESIS, SERUM, WITH REFLEX
A/G Ratio: 1.7 (ref 0.7–1.7)
Albumin ELP: 4 g/dL (ref 2.9–4.4)
Alpha-1-Globulin: 0.2 g/dL (ref 0.0–0.4)
Alpha-2-Globulin: 0.7 g/dL (ref 0.4–1.0)
Beta Globulin: 0.8 g/dL (ref 0.7–1.3)
Gamma Globulin: 0.6 g/dL (ref 0.4–1.8)
Globulin, Total: 2.4 g/dL (ref 2.2–3.9)
Total Protein ELP: 6.4 g/dL (ref 6.0–8.5)

## 2020-09-29 LAB — IGG, IGA, IGM
IgA: 88 mg/dL (ref 64–422)
IgG (Immunoglobin G), Serum: 780 mg/dL (ref 586–1602)
IgM (Immunoglobulin M), Srm: 20 mg/dL — ABNORMAL LOW (ref 26–217)

## 2020-09-29 LAB — KAPPA/LAMBDA LIGHT CHAINS
Kappa free light chain: 34 mg/L — ABNORMAL HIGH (ref 3.3–19.4)
Kappa, lambda light chain ratio: 1.67 — ABNORMAL HIGH (ref 0.26–1.65)
Lambda free light chains: 20.3 mg/L (ref 5.7–26.3)

## 2020-09-29 NOTE — Assessment & Plan Note (Addendum)
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  Patient denies any new symptoms except for feeling tired and some loss of appetite and loss of weight which she attributes to the recent radiation.  She did notice some improvement in the past couple weeks. Physical examination unremarkable, healthy appearing 80 year old female patient, mild pallor noted. We have reviewed her labs for several years, she does have normocytic normochromic anemia which is slowly progressed in linear relationship with her worsening GFR.  She also has a ferritin of 22 hence have recommended oral iron supplementation ferrous sulfate 65 mg once a day if she can tolerate it. We have also ordered some SPEP, awaiting test results.  No evidence of hemolysis. I do not believe she is a very good candidate for Retacrit at this time since her hemoglobin is about 10 g and also given her history of Solid tumors. I have encouraged her to follow-up with her nephrologist given her transplant history and following GFR which is concerning.  In the meantime I have recommended should she continue oral iron supplementation and return to clinic in 6 weeks.  If there is any evidence of monoclonal protein, we will recommend a sooner follow-up for additional testing.  No evidence of P21 or folic acid deficiency.

## 2020-10-19 ENCOUNTER — Ambulatory Visit: Payer: Medicare Other

## 2020-11-03 ENCOUNTER — Ambulatory Visit: Payer: Medicare Other | Attending: Radiation Oncology

## 2020-11-03 ENCOUNTER — Other Ambulatory Visit: Payer: Self-pay

## 2020-11-03 DIAGNOSIS — R1310 Dysphagia, unspecified: Secondary | ICD-10-CM | POA: Insufficient documentation

## 2020-11-03 NOTE — Patient Instructions (Signed)
  Take a sip prior to a bite of dry, crumbly food (like the pork skins), if you desire food like that.

## 2020-11-03 NOTE — Therapy (Signed)
[facility] [address] [place], [place], [address] Phone: [phone]   Fax:  [phone]  Speech Language Pathology Treatment  Patient Details  Name: Anita Pollard MRN: [id number] Date of Birth: 07-31-1940 Referring Provider (SLP): Eppie Gibson, MD   Encounter Date: 11/03/2020   End of Session - 11/03/20 1741     Visit Number 7    Number of Visits 8    Date for SLP Re-Evaluation 12/12/20   90 days   SLP Start Time 1319    SLP Stop Time  1400    SLP Time Calculation (min) 41 min    Activity Tolerance Patient tolerated treatment well             Past Medical History:  Diagnosis Date   Alcohol abuse    Anemia    Arthritis    Breast cancer ([facility]) 2010   Right   Chronic kidney disease    Colon polyp    Depression    Diabetes mellitus    GERD (gastroesophageal reflux disease)    Heart murmur    History of kidney stones 1976   HTN (hypertension)    Hypothyroidism    Osteopenia    Pancreatitis    Ulcerative esophagitis     Past Surgical History:  Procedure Laterality Date   ABDOMINAL HYSTERECTOMY     ABDOMINOPLASTY     APPENDECTOMY     BREAST BIOPSY Right 08/03/2008   Stereo Bx, Malignant   BREAST LUMPECTOMY Right 08/26/2008   CATARACT EXTRACTION W/ INTRAOCULAR LENS  IMPLANT, BILATERAL     CO2 LASER APPLICATION N/A 28/78/6767   Procedure: CO2 LASER APPLICATION;  Surgeon: Melida Quitter, MD;  Location: [facility];  Service: ENT;  Laterality: N/A;   COSMETIC SURGERY     ductal carcinoma     right breast    EYE SURGERY     HIP ARTHROPLASTY Right    INSERTION OF DIALYSIS CATHETER Right 09/25/2012   Procedure: INSERTION OF DIALYSIS CATHETER;  Surgeon: Mal Misty, MD;  Location: [facility];  Service: Vascular;  Laterality: Right;  Righ Internal Jugular Placement   IR GASTROSTOMY TUBE MOD SED  02/10/2020   IR GASTROSTOMY TUBE REMOVAL  05/25/2020   JOINT REPLACEMENT     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    MICROLARYNGOSCOPY N/A 10/20/2019   Procedure: Suspended Microlaryngoscopy with Biopsy;  Surgeon: Melida Quitter, MD;  Location: [facility];  Service: ENT;  Laterality: N/A;   MICROLARYNGOSCOPY N/A 01/09/2020   Procedure: MICROLARYNGOSCOPY WITH BIOPSY;  Surgeon: Melida Quitter, MD;  Location: [facility];  Service: ENT;  Laterality: N/A;   RIGID ESOPHAGOSCOPY N/A 10/20/2019   Procedure: RIGID ESOPHAGOSCOPY;  Surgeon: Melida Quitter, MD;  Location: [facility];  Service: ENT;  Laterality: N/A;   THYROIDECTOMY     TUMMY TUCK      There were no vitals filed for this visit.   Subjective Assessment - 11/03/20 1329     Subjective I can't figure it out I cough in the morning with my (hot) tea, but don't cough at night with my (hot) tea.    Currently in Pain? No/denies                   ADULT SLP TREATMENT - 11/03/20 1335       General Information   Behavior/Cognition Alert;Cooperative;Pleasant mood      Treatment Provided   Treatment provided Dysphagia      Dysphagia Treatment   Temperature Spikes Noted  No    Respiratory Status Room air    Treatment Methods Skilled observation;Therapeutic exercise;Compensation strategy training;Patient/caregiver education    Patient observed directly with PO's Yes    Type of PO's observed Dysphagia 3 (soft);Thin liquids    Oral Phase Signs & Symptoms Other (comment)   none noted today   Pharyngeal Phase Signs & Symptoms Other (comment)   none noted today   Other treatment/comments Pt cont to report "getting strangled every once in awhile. (pt thinks once/week)" Anita Pollard tells SLP last time she coughed was last week when she ate pork skins and breathed in as soon as she bit down. SLP encouraged pt to pay attention with light and dry things such as pork skins, tortilla chips. HEP completed with independence. SLP otld pt she could complete HEP x1/day - at least 4 days a week now but could do x1/day x7/week if she preferred.      Assessment / Recommendations / Plan    Plan Continue with current plan of care   every other month - likely d/c next session     Dysphagia Recommendations   Diet recommendations Thin liquid;Dysphagia 3 (mechanical soft);Nectar-thick liquid   other solids as tolerated   Compensations Slow rate;Small sips/bites   sip prior to a bite of drier crumbly foods if she desires these types of food     Progression Toward Goals   Progression toward goals Progressing toward goals              SLP Education - 11/03/20 1741     Education Details precautions for dry,crumbly food, do HEP at least once/day x4 days/week    Person(s) Educated Patient    Methods Explanation    Comprehension Verbalized understanding              SLP Short Term Goals - 04/27/20 2350       SLP SHORT TERM GOAL #1   Title Pt will tell SLP rationale for HEP completion    Period --   visit, for all STGs   Status Achieved      SLP SHORT TERM GOAL #2   Title Pt will complete HEP with occasional min A over two sessions    Baseline 03-11-20    Status Achieved      SLP SHORT TERM GOAL #3   Title pt will tell SLP 3 overt s/sx aspiration PNA    Status Achieved      SLP SHORT TERM GOAL #4   Title Pt will tell SLP how a food journal can hasten/facilitate return to more normalized diet    Status Deferred              SLP Long Term Goals - 11/03/20 1744       SLP LONG TERM GOAL #1   Title Pt will complete HEP with rare min A over 3 visits    Baseline 04-27-20, 06-02-20    Period --   visits over 90 days, for all LTGs   Status Achieved      SLP LONG TERM GOAL #2   Title Pt will tell SLP when to decr frequency of HEP    Time 1    Status On-going      SLP LONG TERM GOAL #3   Title pt will tell SLP how to incr difficulty of HEP, with min A    Time 1    Status On-going              Plan -  11/03/20 1742     Clinical Impression Statement Pt presents today with WFL/WNL swallowing ability with sips of thin water and a cereal bar. No overt  s/sx aspiration PNA reported or observed today. Data suggests that as pts progress through rad or chemorad therapy that their swallowing ability will decrease. Also, WNL swallowing is threatened by muscle fibrosis that will likely develop after rad/chemorad is completed. Pt reports she is maintaining her frequency with her HEP. Skilled ST would be cont to be beneficial to the pt in order to regularly assess pt's safety with POs and/or need for instrumental swallow assessment, as well as to assess accurate completion of swallowing HEP. Pt agrees to once every other month.    Speech Therapy Frequency --   once approx every 8 weeks   Duration --   7 total sessions   Treatment/Interventions Aspiration precaution training;Pharyngeal strengthening exercises;Compensatory techniques;Diet toleration management by SLP;Trials of upgraded texture/liquids;Cognitive reorganization;Internal/external aids;Patient/family education;SLP instruction and feedback    Potential to [place] provided today    Consulted and Agree with Plan of Care Patient             Patient will benefit from skilled therapeutic intervention in order to improve the following deficits and impairments:   Dysphagia, unspecified type    Problem List Patient Active Problem List   Diagnosis Date Noted   Malignant neoplasm of supraglottis ([facility]) 01/21/2020   Pelvic mass in female 06/05/2013   DM2 (diabetes mellitus, type 2) ([facility]) 09/24/2012   AKI (acute kidney injury) ([facility]) 09/24/2012   Elevated serum creatinine 09/03/2012   Normocytic normochromic anemia 09/03/2012   Breast cancer ([facility]) 08/31/2011   HYPERTENSION, BENIGN 02/28/2010   Carotid artery disease ([facility]) 01/26/2010    [place] ,[place], [place]  11/03/2020, 5:45 PM  [facility] [address] [place], [place], [address] Phone: [phone]   Fax:  [phone]   Name: SAMUEL RITTENHOUSE MRN: [id number] Date of Birth: 04-18-1940

## 2020-11-05 ENCOUNTER — Telehealth: Payer: Self-pay | Admitting: *Deleted

## 2020-11-05 DIAGNOSIS — R600 Localized edema: Secondary | ICD-10-CM

## 2020-11-05 DIAGNOSIS — Z79899 Other long term (current) drug therapy: Secondary | ICD-10-CM

## 2020-11-05 DIAGNOSIS — Z9221 Personal history of antineoplastic chemotherapy: Secondary | ICD-10-CM

## 2020-11-05 DIAGNOSIS — C140 Malignant neoplasm of pharynx, unspecified: Secondary | ICD-10-CM

## 2020-11-05 MED ORDER — FUROSEMIDE 40 MG PO TABS: 40.0000 mg | ORAL_TABLET | Freq: Every day | ORAL | 0 refills | Status: DC

## 2020-11-05 NOTE — Telephone Encounter (Signed)
Error

## 2020-11-09 NOTE — Progress Notes (Signed)
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  I have recommended some labs during her initial visit which showed normocytic normochromic anemia with hemoglobin of 9.3 g/dL.   During the last visit we recommended certain additional labs and there is no clear evidence of myeloma, hemolysis, nutritional deficiency except for a ferritin of 21 hence have recommended oral iron supplementation once a day.  We have discussed about proceeding with labs today.  Patient mentions that she just had labs done 6 weeks ago says she would like to wait another few weeks before repeating labs.  She however feels good, her energy levels are better.  She has not lost any weight since.  No hematochezia or melena that she reports she has not had colonoscopy in a while. Given her moderate anemia with mild iron deficiency, I think it is reasonable to continue oral iron supplementation and return to clinic in 6 weeks for follow-up with labs.  She is in agreement with all the recommendations.   She was however encouraged to contact us with any concerning questions.  Thank you for consulting [place] in the care of this patient.  Please do not hesitate to contact us with any additional questions or concerns.  HISTORY OF PRESENTING ILLNESS:   Anita Pollard 80 y.o. female is here because of  anemia.  This is a very pleasant 80 year old female patient with past medical history significant for right-sided breast cancer, cancer of the supraglottis status postradiation, type 2 diabetes, hypertension, kidney transplant referred to hematology for evaluation of slowly progressive anemia.  Since last visit, she has been feeling well.  She has been taking oral iron once a day.  She denies any change in bowel habits except from dark stool from iron.  She tells me that radiation has made her tired and she has been slowly been feeling like herself.  She has had chronic anemia for years. No other complaints for me today.  Rest of the pertinent 10 point ROS reviewed and negative.  REVIEW OF SYSTEMS:   Constitutional: Denies fevers, chills or abnormal night sweats Eyes: Denies blurriness of vision, double vision or watery eyes Ears, nose, mouth, throat, and face: Denies mucositis or sore throat Respiratory: Denies cough, dyspnea or wheezes Cardiovascular: Denies palpitation, chest discomfort or lower extremity swelling Gastrointestinal:  Denies nausea, heartburn or change in bowel habits Skin: Denies abnormal skin rashes Lymphatics: Denies new lymphadenopathy or easy bruising Neurological:Denies numbness, tingling or new weaknesses Behavioral/Psych: Mood is stable, no new changes  All other systems were reviewed with the patient and are negative.  MEDICAL HISTORY:  Past Medical History:  Diagnosis Date   Alcohol abuse    Anemia    Arthritis    Breast cancer ([facility]) 2010   Right   Chronic kidney disease    Colon polyp    Depression    Diabetes mellitus    GERD (gastroesophageal reflux disease)    Heart murmur    History of  kidney stones 1976   HTN (hypertension)    Hypothyroidism    Osteopenia    Pancreatitis    Ulcerative esophagitis     SURGICAL HISTORY: Past Surgical History:  Procedure Laterality Date   ABDOMINAL HYSTERECTOMY     ABDOMINOPLASTY     APPENDECTOMY      BREAST BIOPSY Right 08/03/2008   Stereo Bx, Malignant   BREAST LUMPECTOMY Right 08/26/2008   CATARACT EXTRACTION W/ INTRAOCULAR LENS  IMPLANT, BILATERAL     CO2 LASER APPLICATION N/A 73/42/8768   Procedure: CO2 LASER APPLICATION;  Surgeon: Melida Quitter, MD;  Location: [facility];  Service: ENT;  Laterality: N/A;   COSMETIC SURGERY     ductal carcinoma     right breast    EYE SURGERY     HIP ARTHROPLASTY Right    INSERTION OF DIALYSIS CATHETER Right 09/25/2012   Procedure: INSERTION OF DIALYSIS CATHETER;  Surgeon: Mal Misty, MD;  Location: [facility] OR;  Service: Vascular;  Laterality: Right;  Righ Internal Jugular Placement   IR GASTROSTOMY TUBE MOD SED  02/10/2020   IR GASTROSTOMY TUBE REMOVAL  05/25/2020   JOINT REPLACEMENT     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   MICROLARYNGOSCOPY N/A 10/20/2019   Procedure: Suspended Microlaryngoscopy with Biopsy;  Surgeon: Melida Quitter, MD;  Location: [facility];  Service: ENT;  Laterality: N/A;   MICROLARYNGOSCOPY N/A 01/09/2020   Procedure: MICROLARYNGOSCOPY WITH BIOPSY;  Surgeon: Melida Quitter, MD;  Location: [facility];  Service: ENT;  Laterality: N/A;   RIGID ESOPHAGOSCOPY N/A 10/20/2019   Procedure: RIGID ESOPHAGOSCOPY;  Surgeon: Melida Quitter, MD;  Location: [facility];  Service: ENT;  Laterality: N/A;   THYROIDECTOMY     TUMMY TUCK      SOCIAL HISTORY: Social History   Socioeconomic History   Marital status: Widowed    Spouse name: Not on file   Number of children: Not on file   Years of education: Not on file   Highest education level: Not on file  Occupational History   Not on file  Tobacco Use   Smoking status: Former    Types: Cigarettes    Quit date: 09/25/1983    Years since quitting: 37.1   Smokeless tobacco: Never  Vaping Use   Vaping Use: Never used  Substance and Sexual Activity   Alcohol use: Yes    Comment: socially history of heavy drinking with intervention x 2-at least 2 occasions since april '14   Drug use: No   Sexual activity:  Never  Other Topics Concern   Not on file  Social History Narrative   Not on file   Social Determinants of Health   Financial Resource Strain: Low Risk    Difficulty of Paying Living Expenses: Not hard at all  Food Insecurity: No Food Insecurity   Worried About Charity fundraiser in the Last Year: Never true   [place] in the Last Year: Never true  Transportation Needs: No Transportation Needs   Lack of Transportation (Medical): No   Lack of Transportation (Non-Medical): No  Physical Activity: Insufficiently Active   Days of Exercise per Week: 5 days   Minutes of Exercise per Session: 20 min  Stress: Not on file  Social Connections: Moderately Isolated   Frequency of Communication with Friends and Family: More than three times a week   Frequency of Social Gatherings with Friends and Family: Never   Attends Religious Services: Never   Marine scientist or Organizations: Yes  Attends Archivist Meetings: Never   Marital Status: Widowed  Human resources officer Violence: Not on file    FAMILY HISTORY: Family History  Problem Relation Age of Onset   Thyroid cancer Daughter 2   Diabetes Father    Hypertension Father    Diabetes Brother    Hypertension Mother    Hypertension Sister    Colon polyps Brother    Atrial fibrillation Brother    Dementia Sister    Multiple myeloma Sister     ALLERGIES:  is allergic to penicillins, banana, detrol [tolterodine], quetiapine, risperidone and related, and lisinopril.  MEDICATIONS:  Current Outpatient Medications  Medication Sig Dispense Refill   amLODipine (NORVASC) 10 MG tablet Take 10 mg by mouth at bedtime.     Cholecalciferol (VITAMIN D3) 5000 UNITS TABS Take 5,000 Units by mouth 3 (three) times daily after meals.      ferrous sulfate 324 MG TBEC Take 324 mg by mouth.     furosemide (LASIX) 40 MG tablet Take 1 tablet (40 mg total) by mouth daily. Please keep appointment with Dr. Oval Linsey in September for  future refills.Take 1 tablet (40 mg total) by mouth daily. Please keep appointment with Dr. Oval Linsey in September for future refills. PLEASE KEEP UPCOMING APPOINTMENT WITH DR. Marlou Porch IN October 2022 FOR FUTURE REFILLS. THANK YOU 90 tablet 0   glipiZIDE (GLUCOTROL XL) 2.5 MG 24 hr tablet Take 5 mg by mouth 2 (two) times daily.      insulin glargine, 1 Unit Dial, (TOUJEO SOLOSTAR) 300 UNIT/ML Solostar Pen 10 Units.     Lancets (FREESTYLE) lancets by Does not apply route.     levothyroxine (SYNTHROID) 200 MCG tablet Take 200 mcg by mouth daily before breakfast.      losartan (COZAAR) 25 MG tablet Take 25 mg by mouth at bedtime.      Melatonin 5 MG CAPS Take 5 mg by mouth See admin instructions. Take 5 mg 2 hours before bedtime     omeprazole (PRILOSEC) 20 MG capsule Take 20 mg by mouth daily.     Pancrelipase, Lip-Prot-Amyl, (CREON) 3000-9500 units CPEP Take 1 capsule by mouth 3 (three) times daily with meals.      Polyethyl Glycol-Propyl Glycol (SYSTANE OP) Place 1 drop into both eyes every evening.     pramipexole (MIRAPEX) 0.25 MG tablet Take 0.25-0.5 mg by mouth See admin instructions. Take 0.5 mg 2 hours prior to bedtime then 0.25 mg at bedtime     predniSONE (DELTASONE) 10 MG tablet Take 10 mg by mouth daily with breakfast.      raloxifene (EVISTA) 60 MG tablet Take 60 mg by mouth daily.     tacrolimus (PROGRAF) 0.5 MG capsule Take 0.5 mg by mouth every 12 (twelve) hours.      tacrolimus (PROGRAF) 1 MG capsule Take 1 mg by mouth every 12 (twelve) hours.      temazepam (RESTORIL) 15 MG capsule Take 15-30 mg by mouth at bedtime.      vitamin B-12 (CYANOCOBALAMIN) 500 MCG tablet Take 500 mcg by mouth daily.     No current facility-administered medications for this visit.   PHYSICAL EXAMINATION:  ECOG PERFORMANCE STATUS: 0 - Asymptomatic  Vitals:   11/10/20 1121  BP: (!) 149/55  Pulse: 69  Resp: 18  Temp: 97.8 F (36.6 C)  SpO2: 98%    Filed Weights   11/10/20 1121  Weight: 128 lb  1 oz (58.1 kg)     GENERAL:alert, no distress and comfortable,  appears pale.  No acute distress SKIN: skin color, texture, turgor are normal, no rashes or significant lesions EYES: normal, conjunctiva are pink and non-injected, sclera clear OROPHARYNX:no exudate, no erythema and lips, buccal mucosa, and tongue normal  NECK: supple, thyroid normal size, non-tender, without nodularity LYMPH:  no palpable lymphadenopathy in the cervical, axillary  LUNGS: clear to auscultation and percussion with normal breathing effort HEART: regular rate & rhythm and no murmurs and no lower extremity edema ABDOMEN:abdomen soft, non-tender and normal bowel sounds Musculoskeletal:no cyanosis of digits and no clubbing  PSYCH: alert & oriented x 3 with fluent speech NEURO: no focal motor/sensory deficits  LABORATORY DATA:  I have reviewed the data as listed Lab Results  Component Value Date   WBC 7.5 09/28/2020   HGB 10.0 (L) 09/28/2020   HCT 30.4 (L) 09/28/2020   HCT 30.3 (L) 09/28/2020   MCV 94.4 09/28/2020   PLT 178 09/28/2020     Chemistry      Component Value Date/Time   NA 139 09/28/2020 1432   NA 137 03/22/2020 1439   NA 140 08/28/2013 0955   K 4.0 09/28/2020 1432   K 3.0 (LL) 08/28/2013 0955   CL 106 09/28/2020 1432   CL 111 (H) 09/03/2012 1249   CO2 22 09/28/2020 1432   CO2 16 (L) 08/28/2013 0955   BUN 43 (H) 09/28/2020 1432   BUN 42 (H) 03/22/2020 1439   BUN 75.7 (H) 08/28/2013 0955   CREATININE 1.67 (H) 09/28/2020 1432   CREATININE 5.9 (HH) 08/28/2013 0955      Component Value Date/Time   CALCIUM 9.0 09/28/2020 1432   CALCIUM 7.5 (L) 08/28/2013 0955   ALKPHOS 111 09/28/2020 1432   ALKPHOS 62 08/28/2013 0955   AST 15 09/28/2020 1432   AST 15 08/28/2013 0955   ALT 15 09/28/2020 1432   ALT 17 08/28/2013 0955   BILITOT 0.3 09/28/2020 1432   BILITOT 0.38 08/28/2013 0955     I have reviewed pertinent labs from last visit.  No evidence of hemolysis, myeloma, nutritional  deficiency except for mildly low ferritin.  CKD noted.  RADIOGRAPHIC STUDIES: I have personally reviewed the radiological images as listed and agreed with the findings in the report. No results found.  All questions were answered. The patient knows to call the clinic with any problems, questions or concerns. I spent 20 minutes in the care of this patient including H and P, review of records, counseling and coordination of care.     Benay Pike, MD 11/10/2020 1:37 PM

## 2020-11-10 ENCOUNTER — Other Ambulatory Visit: Payer: Self-pay

## 2020-11-10 ENCOUNTER — Inpatient Hospital Stay: Payer: Medicare Other | Attending: Hematology and Oncology | Admitting: Hematology and Oncology

## 2020-11-10 ENCOUNTER — Encounter: Payer: Self-pay | Admitting: Hematology and Oncology

## 2020-11-10 VITALS — BP 149/55 | HR 69 | Temp 97.8°F | Resp 18 | Wt 128.1 lb

## 2020-11-10 DIAGNOSIS — Z79899 Other long term (current) drug therapy: Secondary | ICD-10-CM | POA: Insufficient documentation

## 2020-11-10 DIAGNOSIS — E119 Type 2 diabetes mellitus without complications: Secondary | ICD-10-CM | POA: Insufficient documentation

## 2020-11-10 DIAGNOSIS — D649 Anemia, unspecified: Secondary | ICD-10-CM | POA: Insufficient documentation

## 2020-11-10 DIAGNOSIS — Z8521 Personal history of malignant neoplasm of larynx: Secondary | ICD-10-CM | POA: Insufficient documentation

## 2020-11-10 DIAGNOSIS — Z888 Allergy status to other drugs, medicaments and biological substances status: Secondary | ICD-10-CM | POA: Insufficient documentation

## 2020-11-10 DIAGNOSIS — Z833 Family history of diabetes mellitus: Secondary | ICD-10-CM | POA: Diagnosis not present

## 2020-11-10 DIAGNOSIS — Z940 Kidney transplant status: Secondary | ICD-10-CM | POA: Diagnosis not present

## 2020-11-10 DIAGNOSIS — Z8719 Personal history of other diseases of the digestive system: Secondary | ICD-10-CM | POA: Insufficient documentation

## 2020-11-10 DIAGNOSIS — Z808 Family history of malignant neoplasm of other organs or systems: Secondary | ICD-10-CM | POA: Insufficient documentation

## 2020-11-10 DIAGNOSIS — Z853 Personal history of malignant neoplasm of breast: Secondary | ICD-10-CM | POA: Diagnosis not present

## 2020-11-10 DIAGNOSIS — I10 Essential (primary) hypertension: Secondary | ICD-10-CM | POA: Diagnosis not present

## 2020-11-10 DIAGNOSIS — Z880 Allergy status to penicillin: Secondary | ICD-10-CM | POA: Diagnosis not present

## 2020-11-10 DIAGNOSIS — E611 Iron deficiency: Secondary | ICD-10-CM | POA: Insufficient documentation

## 2020-11-10 DIAGNOSIS — Z818 Family history of other mental and behavioral disorders: Secondary | ICD-10-CM | POA: Diagnosis not present

## 2020-11-10 DIAGNOSIS — Z87442 Personal history of urinary calculi: Secondary | ICD-10-CM | POA: Insufficient documentation

## 2020-11-10 DIAGNOSIS — Z8249 Family history of ischemic heart disease and other diseases of the circulatory system: Secondary | ICD-10-CM | POA: Diagnosis not present

## 2020-11-10 DIAGNOSIS — Z9049 Acquired absence of other specified parts of digestive tract: Secondary | ICD-10-CM | POA: Diagnosis not present

## 2020-11-16 ENCOUNTER — Ambulatory Visit: Payer: Medicare Other

## 2020-12-02 ENCOUNTER — Other Ambulatory Visit: Payer: Self-pay

## 2020-12-02 ENCOUNTER — Ambulatory Visit (HOSPITAL_COMMUNITY)
Admission: RE | Admit: 2020-12-02 | Discharge: 2020-12-02 | Disposition: A | Discharge status: Home / Self Care | Payer: Medicare Other | Source: Ambulatory Visit | Attending: Radiation Oncology | Admitting: Radiation Oncology

## 2020-12-02 DIAGNOSIS — C321 Malignant neoplasm of supraglottis: Secondary | ICD-10-CM | POA: Insufficient documentation

## 2020-12-03 ENCOUNTER — Ambulatory Visit
Admission: RE | Admit: 2020-12-03 | Discharge: 2020-12-03 | Disposition: A | Discharge status: Home / Self Care | Payer: Medicare Other | Source: Ambulatory Visit | Attending: Radiation Oncology | Admitting: Radiation Oncology

## 2020-12-03 ENCOUNTER — Other Ambulatory Visit: Payer: Self-pay

## 2020-12-03 ENCOUNTER — Encounter: Payer: Self-pay | Admitting: Radiation Oncology

## 2020-12-03 VITALS — BP 150/60 | HR 80 | Temp 97.2°F | Resp 18 | Ht 61.0 in

## 2020-12-03 DIAGNOSIS — Z880 Allergy status to penicillin: Secondary | ICD-10-CM | POA: Diagnosis not present

## 2020-12-03 DIAGNOSIS — M47814 Spondylosis without myelopathy or radiculopathy, thoracic region: Secondary | ICD-10-CM | POA: Insufficient documentation

## 2020-12-03 DIAGNOSIS — R911 Solitary pulmonary nodule: Secondary | ICD-10-CM | POA: Diagnosis not present

## 2020-12-03 DIAGNOSIS — I700 Atherosclerosis of aorta: Secondary | ICD-10-CM | POA: Insufficient documentation

## 2020-12-03 DIAGNOSIS — N201 Calculus of ureter: Secondary | ICD-10-CM | POA: Insufficient documentation

## 2020-12-03 DIAGNOSIS — Z79890 Hormone replacement therapy (postmenopausal): Secondary | ICD-10-CM | POA: Insufficient documentation

## 2020-12-03 DIAGNOSIS — M47816 Spondylosis without myelopathy or radiculopathy, lumbar region: Secondary | ICD-10-CM | POA: Insufficient documentation

## 2020-12-03 DIAGNOSIS — E039 Hypothyroidism, unspecified: Secondary | ICD-10-CM | POA: Diagnosis not present

## 2020-12-03 DIAGNOSIS — Z79899 Other long term (current) drug therapy: Secondary | ICD-10-CM | POA: Diagnosis not present

## 2020-12-03 DIAGNOSIS — K861 Other chronic pancreatitis: Secondary | ICD-10-CM | POA: Insufficient documentation

## 2020-12-03 DIAGNOSIS — Z888 Allergy status to other drugs, medicaments and biological substances status: Secondary | ICD-10-CM | POA: Insufficient documentation

## 2020-12-03 DIAGNOSIS — IMO0001 Reserved for inherently not codable concepts without codable children: Secondary | ICD-10-CM

## 2020-12-03 DIAGNOSIS — C321 Malignant neoplasm of supraglottis: Secondary | ICD-10-CM | POA: Diagnosis not present

## 2020-12-03 MED ORDER — OXYMETAZOLINE HCL 0.05 % NA SOLN
1.0000 | Freq: Once | NASAL | Status: AC
  Administered 2020-12-03: 1 via NASAL
  Filled 2020-12-03: qty 30

## 2020-12-03 NOTE — Progress Notes (Signed)
Radiation Oncology         [phone] ________________________________  Name: Anita Pollard MRN: [id number]  Date: 12/03/2020  DOB: September 23, 1940  Follow-Up Visit Note  CC: Lawerance Cruel, MD  Melida Quitter, MD  Diagnosis and Prior Radiotherapy:       ICD-10-CM   1. Lung nodule < 6cm on CT  R91.1 NM PET Image Restag (PS) Skull Base To Thigh    2. 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 Fiberoptic laryngoscopy    oxymetazoline (AFRIN) 0.05 % nasal spray 1 spray    NM PET Image Restag (PS) Skull Base To Thigh     Cancer Staging Malignant neoplasm of supraglottis ([facility]) Staging form: Larynx - Supraglottis, AJCC 8th Edition - Clinical stage from 01/20/2020: Stage II (cT2, cN0, cM0) - Signed by Eppie Gibson, MD on 06/22/2020 Stage prefix: Initial diagnosis  Radiation Treatment Dates: 02/09/2020 through 03/31/2020 Site Technique Total Dose (Gy) Dose per Fx (Gy) Completed Fx Beam Energies  Larynx: HN_supragl IMRT 70/70 2 35/35 6X    CHIEF COMPLAINT:  Here for follow-up and surveillance of throat cancer  Narrative:    Ms. Anita Pollard  is in for follow up after receving radiation therapy to supraglottis. Patient completed treatment on 03/31/20.  Pain issues, if any -  Patient reports having some throat discomfort, reports  losing voice  at times   Using a feeding tube?  N/A    Weight is stable   Swallowing issues, if any  Patient denies any issues with swallowing.     Smoking or chewing tobacco?  no  Using fluoride tray daily?  Yes   Last ENT visit was on?  Patient saw Dr. Redmond Baseman on 09/07/20.   Other notable issues, if any :   Vitals:   12/03/20 1048  BP: (!) 150/60  Pulse: 80  Resp: 18  Temp: (!) 97.2 F (36.2 C)  TempSrc: Temporal  SpO2: 99%  Height: 5\' 1"  (1.549 m)             ALLERGIES:  is allergic to penicillins, banana, detrol [tolterodine], quetiapine, risperidone and related, and lisinopril.  Meds: Current Outpatient Medications  Medication  Sig Dispense Refill   amLODipine (NORVASC) 10 MG tablet Take 10 mg by mouth at bedtime.     Cholecalciferol (VITAMIN D3) 5000 UNITS TABS Take 5,000 Units by mouth 3 (three) times daily after meals.      ferrous sulfate 324 MG TBEC Take 324 mg by mouth.     furosemide (LASIX) 40 MG tablet Take 1 tablet (40 mg total) by mouth daily. Please keep appointment with Dr. Oval Linsey in September for future refills.Take 1 tablet (40 mg total) by mouth daily. Please keep appointment with Dr. Oval Linsey in September for future refills. PLEASE KEEP UPCOMING APPOINTMENT WITH DR. Marlou Porch IN October 2022 FOR FUTURE REFILLS. THANK YOU 90 tablet 0   glipiZIDE (GLUCOTROL XL) 2.5 MG 24 hr tablet Take 5 mg by mouth 2 (two) times daily.      Lancets (FREESTYLE) lancets by Does not apply route.     levothyroxine (SYNTHROID) 200 MCG tablet Take 200 mcg by mouth daily before breakfast.      losartan (COZAAR) 25 MG tablet Take 25 mg by mouth at bedtime.      Melatonin 5 MG CAPS Take 5 mg by mouth See admin instructions. Take 5 mg 2 hours before bedtime     omeprazole (PRILOSEC) 20 MG capsule Take 20 mg by mouth daily.  Pancrelipase, Lip-Prot-Amyl, (CREON) 3000-9500 units CPEP Take 1 capsule by mouth 3 (three) times daily with meals.      Polyethyl Glycol-Propyl Glycol (SYSTANE OP) Place 1 drop into both eyes every evening.     pramipexole (MIRAPEX) 0.25 MG tablet Take 0.25-0.5 mg by mouth See admin instructions. Take 0.5 mg 2 hours prior to bedtime then 0.25 mg at bedtime     predniSONE (DELTASONE) 10 MG tablet Take 10 mg by mouth daily with breakfast.      raloxifene (EVISTA) 60 MG tablet Take 60 mg by mouth daily.     tacrolimus (PROGRAF) 0.5 MG capsule Take 0.5 mg by mouth every 12 (twelve) hours.      tacrolimus (PROGRAF) 1 MG capsule Take 1 mg by mouth every 12 (twelve) hours.      temazepam (RESTORIL) 15 MG capsule Take 15-30 mg by mouth at bedtime.      vitamin B-12 (CYANOCOBALAMIN) 500 MCG tablet Take 500 mcg by  mouth daily.     insulin glargine, 1 Unit Dial, (TOUJEO SOLOSTAR) 300 UNIT/ML Solostar Pen 10 Units. (Patient not taking: Reported on 12/03/2020)     No current facility-administered medications for this encounter.    Physical Findings: The patient is in no acute distress. Patient is alert and oriented. Wt Readings from Last 3 Encounters:  11/10/20 128 lb 1 oz (58.1 kg)  09/28/20 123 lb 1.6 oz (55.8 kg)  07/23/20 129 lb 6 oz (58.7 kg)    height is 5\' 1"  (1.549 m). Her temporal temperature is 97.2 F (36.2 C) (abnormal). Her blood pressure is 150/60 (abnormal) and her pulse is 80. Her respiration is 18 and oxygen saturation is 99%. .  General: Alert and oriented, in no acute distress  HEENT: Head is normocephalic. Extraocular movements are intact.  No oral cavity lesions nor any lesions in her upper throat. Neck: Neck is notable for no palpable adenopathy. Skin: Skin in treatment fields shows satisfactory healing in treatment fields. Psychiatric: Judgment and insight are intact. Affect is appropriate. Heart is notable for a systolic murmur (patient is aware and this was previously diagnosed), regular in rate and rhythm Chest is clear to auscultation bilaterally  PROCEDURE NOTE: After obtaining consent and spraying nasal cavity with topical oxymetazoline, the flexible endoscope was introduced and passed through the nasal cavity.  The nasopharynx, oropharynx, hypopharynx, and larynx  were then examined.  The right arytenoid is slightly swollen compared to the left.  There are no nodules or lesions of concern in the supraglottis.  The true cords were symmetrically mobile.  The left posterior true cord demonstrates a patch of erythema but no nodularity.   Lab Findings: Lab Results  Component Value Date   WBC 7.5 09/28/2020   HGB 10.0 (L) 09/28/2020   HCT 30.4 (L) 09/28/2020   HCT 30.3 (L) 09/28/2020   MCV 94.4 09/28/2020   PLT 178 09/28/2020    Lab Results  Component Value Date   TSH  0.402 01/30/2020    Radiographic Findings: CT Chest Wo Contrast  Result Date: 12/03/2020 CLINICAL DATA:  Head and neck cancer.  Routine surveillance. EXAM: CT CHEST WITHOUT CONTRAST TECHNIQUE: Multidetector CT imaging of the chest was performed following the standard protocol without IV contrast. COMPARISON:  PET-CT 07/19/2020 FINDINGS: Cardiovascular: Heart size is within normal limits. Aortic atherosclerosis. Coronary artery calcifications. No pericardial effusion. Mediastinum/Nodes: Thyroidectomy. The trachea appears patent and is midline. Normal appearance of the esophagus. No enlarged axillary, supraclavicular, or mediastinal lymph nodes. Hilar structures are  suboptimally evaluated due to lack of IV contrast. Lungs/Pleura: No pleural effusion. No airspace consolidation, atelectasis, or pneumothorax. Scattered lung nodules are again noted, including: -Subpleural nodule within the posterolateral left lower lobe measures 1.2 cm, image 46/7. This is increased from 0.6 cm on 01/29/2020. More recently, on the PET-CT from 07/19/2020 this measured 0.9 cm and had an SUV max of 1.9. -Non solid nodule within the right upper lobe measures 1.5 cm, image 32/7. Unchanged from 07/19/2020. -Small calcified granuloma noted within the basilar right upper lobe. -Irregular, triangular-shaped nodule within the right lower lobe is new from the previous exam measuring 7 mm, image 73/7. -Stable tiny subpleural nodule within the posterior left apex measures 3 mm, image 16/7. Upper Abdomen: No acute abnormality noted within the imaged portions of the upper abdomen. Extensive aortic atherosclerosis. Bilateral renal cortical atrophy. Again seen is a 0.9 x 0.7 cm stone at the left UPJ without hydronephrosis. Calcifications and parenchymal atrophy throughout the pancreas compatible with changes secondary to chronic pancreatitis. Musculoskeletal: No acute or suspicious osseous findings. Degenerative changes noted within the lower  thoracic and upper lumbar spine. IMPRESSION: 1. There has been interval increase in size of left lower lobe subpleural nodule which now measures 1.2 cm versus 0.6 cm on 01/29/2020 and 0.9 cm on 07/19/2020. Despite the low-level FDG uptake on previous exam (SUV max 1.9) the progressive nature of this nodule is suspicious for either primary bronchogenic carcinoma or metastatic disease. 2. There is a new, triangular-shaped nodule within the right lower lobe measuring 7 mm. This has a nonspecific appearance and may be postinflammatory/infectious in etiology. Attention on future surveillance imaging is advised. 3. Persistent non solid nodule within the right upper lobe measures 1.5 cm. Adenocarcinoma cannot be excluded. Follow up by CT is recommended in 12 months, with continued annual surveillance for a minimum of 3 years. These recommendations are taken from: Recommendations for the Management of Subsolid Pulmonary Nodules Detected at CT: A Statement from the [facility] 2013; 266:1, 304-317. 4. Aortic atherosclerosis. Coronary artery calcifications. 5. Left UPJ calculus. No hydronephrosis. 6. Changes secondary to chronic pancreatitis. Aortic Atherosclerosis (ICD10-I70.0). Electronically Signed   By: Kerby Moors M.D.   On: 12/03/2020 08:49     Impression/Plan:    1) Head and Neck Cancer Status: No evidence of local recurrence.  I will try to get a photograph to Dr. Redmond Baseman to show him the erythema on her glottis.  I do not favor this to be malignant.  I reviewed the patient's imaging with her.  She has a left posterior lung lesion that is growing and suspicious for new primary or oligometastasis.  We talked about different options including referral to cardiothoracic surgery and PET scan.  It is very unlikely that she will be a good surgical candidate given her comorbidities.  She and I have concluded to pursue a restaging PET scan.  If it is highly hypermetabolic (in absence of other suspicious  lesions /nodes ) we may forego a tissue biopsy and consider SBRT.  We will have a discussion upon the results of the PET scan.  2) Nutritional Status: Weight has stabilized.   PEG tube removed  3) Risk Factors: The patient has been educated about risk factors including alcohol and tobacco abuse; they understand that avoidance of alcohol and tobacco is important to prevent recurrences as well as other cancers  4) Swallowing:   Continue swallowing exercises per SLP  5) Dental: Encouraged to continue regular followup with dentistry, and dental  hygiene including fluoride rinses.   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  She will continue supplementation and labs per her doctor's instructions. Lab Results  Component Value Date   TSH 0.402 01/30/2020    7)   I will call her to give the results to her PET scan and we will go from there.     On date of service, in total, I spent 30 minutes on this encounter. Patient was seen in person. _____________________________________   Eppie Gibson, MD

## 2020-12-03 NOTE — Progress Notes (Signed)
Anita Pollard  is in for follow up after receving radiation therapy to supraglottis. Patient completed treatment on 03/31/20.  Pain issues, if any -  Patient reports having some throat discomfort, reports  losing voice  at times   Using a feeding tube?  N/A     Weight changes, if any  Wt Readings from Last 3 Encounters:  11/10/20 128 lb 1 oz (58.1 kg)  09/28/20 123 lb 1.6 oz (55.8 kg)  07/23/20 129 lb 6 oz (58.7 kg)       Swallowing issues, if any  Patient denies any issues with swallowing.     Smoking or chewing tobacco?  no  Using fluoride tray daily?  Yes   Last ENT visit was on?  Patient saw Dr. Redmond Baseman on 09/07/20.   Other notable issues, if any :   Vitals:   12/03/20 1048  BP: (!) 150/60  Pulse: 80  Resp: 18  Temp: (!) 97.2 F (36.2 C)  TempSrc: Temporal  SpO2: 99%  Height: 5\' 1"  (1.549 m)

## 2020-12-06 ENCOUNTER — Telehealth: Payer: Self-pay | Admitting: *Deleted

## 2020-12-06 NOTE — Telephone Encounter (Signed)
CALLED PATIENT TO INFORM OF PET SCAN FOR 12-14-20 - ARRIVAL TIME- 10:30 AM @ [facility], PATIENT TO BE NPO- 6 HRS. PRIOR TO TEST, PATIENT TO HAVE WATER ONLY, PATIENT TO AVOID CARBS LAST MEAL OF DAY PRIOR TO TEST, PATIENT TO HOLD SHORT ACTING INSULIN - 2 HRS. PRIOR TO TEST OR IF SHE IS DOING LONG ACTING INSULIN IT IS TO BE HELD MIDNIGHT THE DAY BEFORE TEST SPOKE WITH PATIENT AND SHE VERIFIED UNDERSTANDING THESE APPTS.

## 2020-12-07 ENCOUNTER — Ambulatory Visit (HOSPITAL_BASED_OUTPATIENT_CLINIC_OR_DEPARTMENT_OTHER): Payer: Medicare Other | Admitting: Cardiovascular Disease

## 2020-12-14 ENCOUNTER — Other Ambulatory Visit: Payer: Self-pay

## 2020-12-14 ENCOUNTER — Ambulatory Visit (HOSPITAL_COMMUNITY)
Admission: RE | Admit: 2020-12-14 | Discharge: 2020-12-14 | Disposition: A | Discharge status: Home / Self Care | Payer: Medicare Other | Source: Ambulatory Visit | Attending: Radiation Oncology | Admitting: Radiation Oncology

## 2020-12-14 DIAGNOSIS — I700 Atherosclerosis of aorta: Secondary | ICD-10-CM | POA: Diagnosis not present

## 2020-12-14 DIAGNOSIS — I2510 Atherosclerotic heart disease of native coronary artery without angina pectoris: Secondary | ICD-10-CM | POA: Diagnosis not present

## 2020-12-14 DIAGNOSIS — C321 Malignant neoplasm of supraglottis: Secondary | ICD-10-CM | POA: Insufficient documentation

## 2020-12-14 DIAGNOSIS — IMO0001 Reserved for inherently not codable concepts without codable children: Secondary | ICD-10-CM

## 2020-12-14 DIAGNOSIS — R911 Solitary pulmonary nodule: Secondary | ICD-10-CM | POA: Diagnosis present

## 2020-12-14 LAB — GLUCOSE, CAPILLARY: Glucose-Capillary: 193 mg/dL — ABNORMAL HIGH (ref 70–99)

## 2020-12-14 MED ORDER — FLUDEOXYGLUCOSE F - 18 (FDG) INJECTION
6.6000 | Freq: Once | INTRAVENOUS | Status: AC
  Administered 2020-12-14: 5.91 via INTRAVENOUS

## 2020-12-22 ENCOUNTER — Encounter: Payer: Self-pay | Admitting: Hematology and Oncology

## 2020-12-23 ENCOUNTER — Inpatient Hospital Stay: Payer: Medicare Other

## 2020-12-23 ENCOUNTER — Other Ambulatory Visit: Payer: Self-pay

## 2020-12-23 ENCOUNTER — Encounter: Payer: Self-pay | Admitting: Hematology and Oncology

## 2020-12-23 ENCOUNTER — Inpatient Hospital Stay: Payer: Medicare Other | Attending: Hematology and Oncology | Admitting: Hematology and Oncology

## 2020-12-23 VITALS — BP 167/61 | HR 84 | Temp 97.8°F | Resp 19 | Ht 61.0 in | Wt 121.4 lb

## 2020-12-23 DIAGNOSIS — Z833 Family history of diabetes mellitus: Secondary | ICD-10-CM | POA: Insufficient documentation

## 2020-12-23 DIAGNOSIS — R918 Other nonspecific abnormal finding of lung field: Secondary | ICD-10-CM | POA: Insufficient documentation

## 2020-12-23 DIAGNOSIS — Z8521 Personal history of malignant neoplasm of larynx: Secondary | ICD-10-CM | POA: Insufficient documentation

## 2020-12-23 DIAGNOSIS — N201 Calculus of ureter: Secondary | ICD-10-CM | POA: Diagnosis not present

## 2020-12-23 DIAGNOSIS — Z853 Personal history of malignant neoplasm of breast: Secondary | ICD-10-CM | POA: Diagnosis not present

## 2020-12-23 DIAGNOSIS — Z8249 Family history of ischemic heart disease and other diseases of the circulatory system: Secondary | ICD-10-CM | POA: Diagnosis not present

## 2020-12-23 DIAGNOSIS — M47814 Spondylosis without myelopathy or radiculopathy, thoracic region: Secondary | ICD-10-CM | POA: Insufficient documentation

## 2020-12-23 DIAGNOSIS — M47816 Spondylosis without myelopathy or radiculopathy, lumbar region: Secondary | ICD-10-CM | POA: Diagnosis not present

## 2020-12-23 DIAGNOSIS — Z87442 Personal history of urinary calculi: Secondary | ICD-10-CM | POA: Diagnosis not present

## 2020-12-23 DIAGNOSIS — K861 Other chronic pancreatitis: Secondary | ICD-10-CM | POA: Diagnosis not present

## 2020-12-23 DIAGNOSIS — Z8719 Personal history of other diseases of the digestive system: Secondary | ICD-10-CM | POA: Insufficient documentation

## 2020-12-23 DIAGNOSIS — Z808 Family history of malignant neoplasm of other organs or systems: Secondary | ICD-10-CM | POA: Insufficient documentation

## 2020-12-23 DIAGNOSIS — Z880 Allergy status to penicillin: Secondary | ICD-10-CM | POA: Insufficient documentation

## 2020-12-23 DIAGNOSIS — K5730 Diverticulosis of large intestine without perforation or abscess without bleeding: Secondary | ICD-10-CM | POA: Diagnosis not present

## 2020-12-23 DIAGNOSIS — Z79899 Other long term (current) drug therapy: Secondary | ICD-10-CM | POA: Diagnosis not present

## 2020-12-23 DIAGNOSIS — I2510 Atherosclerotic heart disease of native coronary artery without angina pectoris: Secondary | ICD-10-CM | POA: Insufficient documentation

## 2020-12-23 DIAGNOSIS — C760 Malignant neoplasm of head, face and neck: Secondary | ICD-10-CM | POA: Diagnosis present

## 2020-12-23 DIAGNOSIS — Z888 Allergy status to other drugs, medicaments and biological substances status: Secondary | ICD-10-CM | POA: Diagnosis not present

## 2020-12-23 DIAGNOSIS — Z940 Kidney transplant status: Secondary | ICD-10-CM | POA: Insufficient documentation

## 2020-12-23 DIAGNOSIS — N189 Chronic kidney disease, unspecified: Secondary | ICD-10-CM | POA: Insufficient documentation

## 2020-12-23 DIAGNOSIS — D649 Anemia, unspecified: Secondary | ICD-10-CM | POA: Diagnosis present

## 2020-12-23 DIAGNOSIS — I129 Hypertensive chronic kidney disease with stage 1 through stage 4 chronic kidney disease, or unspecified chronic kidney disease: Secondary | ICD-10-CM | POA: Diagnosis not present

## 2020-12-23 DIAGNOSIS — Z818 Family history of other mental and behavioral disorders: Secondary | ICD-10-CM | POA: Insufficient documentation

## 2020-12-23 DIAGNOSIS — Z9049 Acquired absence of other specified parts of digestive tract: Secondary | ICD-10-CM | POA: Insufficient documentation

## 2020-12-23 DIAGNOSIS — I700 Atherosclerosis of aorta: Secondary | ICD-10-CM | POA: Insufficient documentation

## 2020-12-23 NOTE — Progress Notes (Signed)
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 She had no evidence of myeloma, hemolysis, nutritional deficiency except for a ferritin of 21 hence have recommended oral iron supplementation once a day.   She recently had labs which showed a hemoglobin of 10.5 g/dL, normal white count and platelet count, slight lymphocytopenia at 600.  Iron profile showed ferritin of 29 and otherwise normal iron labs. I have encouraged oral iron twice a day and RTC  in 3 months Overall her labs are pretty much at baseline.  Hb of 10.5 g/dl likely also from her CKD. She will continue SBRT with Dr Isidore Moos as recommended.  PLAN Continue iron BID and RTC in 3 months.  Thank you for consulting [place] in the care of this patient.  Please do not hesitate to contact us with any additional questions or concerns.  HISTORY OF PRESENTING ILLNESS:   Anita Pollard 80 y.o. female is here because of anemia.  This is a very pleasant 80 year old female patient with past medical history significant for right-sided breast cancer, cancer of the supraglottis status postradiation, type 2 diabetes, hypertension, kidney transplant referred to hematology for evaluation of slowly progressive anemia.  Since last visit, she has been feeling well.  She has been taking oral iron once a day.   No  complaints with iron supplementation. She has diarrhea at baseline, so she is not worried about constipation from iron. She denies any other complaints over all feeling well She will be doing SBRT by Dr Isidore Moos for her lung nodule.  REVIEW OF SYSTEMS:   Constitutional: Denies fevers, chills or abnormal night sweats Eyes: Denies blurriness of vision, double vision or watery eyes Ears, nose, mouth, throat, and face: Denies mucositis or sore throat Respiratory: Denies cough, dyspnea or wheezes Cardiovascular: Denies palpitation, chest discomfort or lower extremity swelling Gastrointestinal:  Denies nausea, heartburn or change in bowel habits Skin: Denies abnormal skin rashes Lymphatics: Denies new lymphadenopathy or easy bruising Neurological:Denies numbness, tingling or new weaknesses Behavioral/Psych: Mood is stable, no new changes  All other systems were reviewed with the patient and are negative.  MEDICAL HISTORY:  Past Medical History:  Diagnosis Date   Alcohol abuse    Anemia    Arthritis    Breast cancer ([place]) 2010   Right   Chronic kidney disease    Colon polyp    Depression    Diabetes mellitus    GERD (gastroesophageal reflux disease)    Heart murmur    History of kidney stones 1976   HTN (hypertension)    Hypothyroidism    Osteopenia    Pancreatitis    Ulcerative esophagitis     SURGICAL HISTORY: Past Surgical History:  Procedure Laterality Date   ABDOMINAL HYSTERECTOMY     ABDOMINOPLASTY     APPENDECTOMY     BREAST BIOPSY Right 08/03/2008   Stereo Bx, Malignant   BREAST LUMPECTOMY Right 08/26/2008   CATARACT EXTRACTION W/ INTRAOCULAR LENS  IMPLANT, BILATERAL  CO2 LASER APPLICATION N/A 67/02/4579   Procedure: CO2 LASER APPLICATION;  Surgeon: Melida Quitter, MD;  Location: [facility];  Service: ENT;  Laterality: N/A;   COSMETIC SURGERY     ductal carcinoma     right breast    EYE SURGERY     HIP ARTHROPLASTY Right    INSERTION OF DIALYSIS CATHETER Right  09/25/2012   Procedure: INSERTION OF DIALYSIS CATHETER;  Surgeon: Mal Misty, MD;  Location: [facility];  Service: Vascular;  Laterality: Right;  Righ Internal Jugular Placement   IR GASTROSTOMY TUBE MOD SED  02/10/2020   IR GASTROSTOMY TUBE REMOVAL  05/25/2020   JOINT REPLACEMENT     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   MICROLARYNGOSCOPY N/A 10/20/2019   Procedure: Suspended Microlaryngoscopy with Biopsy;  Surgeon: Melida Quitter, MD;  Location: [facility];  Service: ENT;  Laterality: N/A;   MICROLARYNGOSCOPY N/A 01/09/2020   Procedure: MICROLARYNGOSCOPY WITH BIOPSY;  Surgeon: Melida Quitter, MD;  Location: [facility];  Service: ENT;  Laterality: N/A;   RIGID ESOPHAGOSCOPY N/A 10/20/2019   Procedure: RIGID ESOPHAGOSCOPY;  Surgeon: Melida Quitter, MD;  Location: [facility];  Service: ENT;  Laterality: N/A;   THYROIDECTOMY     TUMMY TUCK      SOCIAL HISTORY: Social History   Socioeconomic History   Marital status: Widowed    Spouse name: Not on file   Number of children: Not on file   Years of education: Not on file   Highest education level: Not on file  Occupational History   Not on file  Tobacco Use   Smoking status: Former    Types: Cigarettes    Quit date: 09/25/1983    Years since quitting: 37.2   Smokeless tobacco: Never  Vaping Use   Vaping Use: Never used  Substance and Sexual Activity   Alcohol use: Yes    Comment: socially history of heavy drinking with intervention x 2-at least 2 occasions since april '14   Drug use: No   Sexual activity: Never  Other Topics Concern   Not on file  Social History Narrative   Not on file   Social Determinants of Health   Financial Resource Strain: Low Risk    Difficulty of Paying Living Expenses: Not hard at all  Food Insecurity: No Food Insecurity   Worried About Charity fundraiser in the Last Year: Never true   [place] in the Last Year: Never true  Transportation Needs: No Transportation Needs   Lack of Transportation (Medical): No    Lack of Transportation (Non-Medical): No  Physical Activity: Insufficiently Active   Days of Exercise per Week: 5 days   Minutes of Exercise per Session: 20 min  Stress: Not on file  Social Connections: Moderately Isolated   Frequency of Communication with Friends and Family: More than three times a week   Frequency of Social Gatherings with Friends and Family: Never   Attends Religious Services: Never   Marine scientist or Organizations: Yes   Attends Archivist Meetings: Never   Marital Status: Widowed  Human resources officer Violence: Not on file    FAMILY HISTORY: Family History  Problem Relation Age of Onset   Thyroid cancer Daughter 41   Diabetes Father    Hypertension Father    Diabetes Brother    Hypertension Mother    Hypertension Sister    Colon polyps Brother    Atrial fibrillation Brother    Dementia Sister    Multiple  myeloma Sister     ALLERGIES:  is allergic to penicillins, banana, detrol [tolterodine], quetiapine, risperidone and related, and lisinopril.  MEDICATIONS:  Current Outpatient Medications  Medication Sig Dispense Refill   amLODipine (NORVASC) 10 MG tablet Take 10 mg by mouth at bedtime.     Cholecalciferol (VITAMIN D3) 5000 UNITS TABS Take 5,000 Units by mouth 3 (three) times daily after meals.      ferrous sulfate 324 MG TBEC Take 324 mg by mouth.     furosemide (LASIX) 40 MG tablet Take 1 tablet (40 mg total) by mouth daily. Please keep appointment with Dr. Oval Linsey in September for future refills.Take 1 tablet (40 mg total) by mouth daily. Please keep appointment with Dr. Oval Linsey in September for future refills. PLEASE KEEP UPCOMING APPOINTMENT WITH DR. Marlou Porch IN October 2022 FOR FUTURE REFILLS. THANK YOU 90 tablet 0   glipiZIDE (GLUCOTROL XL) 2.5 MG 24 hr tablet Take 5 mg by mouth 2 (two) times daily.      insulin glargine, 1 Unit Dial, (TOUJEO SOLOSTAR) 300 UNIT/ML Solostar Pen 10 Units. (Patient not taking: Reported on 12/03/2020)      Lancets (FREESTYLE) lancets by Does not apply route.     levothyroxine (SYNTHROID) 200 MCG tablet Take 200 mcg by mouth daily before breakfast.      losartan (COZAAR) 25 MG tablet Take 25 mg by mouth at bedtime.      Melatonin 5 MG CAPS Take 5 mg by mouth See admin instructions. Take 5 mg 2 hours before bedtime     omeprazole (PRILOSEC) 20 MG capsule Take 20 mg by mouth daily.     Pancrelipase, Lip-Prot-Amyl, (CREON) 3000-9500 units CPEP Take 1 capsule by mouth 3 (three) times daily with meals.      Polyethyl Glycol-Propyl Glycol (SYSTANE OP) Place 1 drop into both eyes every evening.     pramipexole (MIRAPEX) 0.25 MG tablet Take 0.25-0.5 mg by mouth See admin instructions. Take 0.5 mg 2 hours prior to bedtime then 0.25 mg at bedtime     predniSONE (DELTASONE) 10 MG tablet Take 10 mg by mouth daily with breakfast.      raloxifene (EVISTA) 60 MG tablet Take 60 mg by mouth daily.     tacrolimus (PROGRAF) 0.5 MG capsule Take 0.5 mg by mouth every 12 (twelve) hours.      tacrolimus (PROGRAF) 1 MG capsule Take 1 mg by mouth every 12 (twelve) hours.      temazepam (RESTORIL) 15 MG capsule Take 15-30 mg by mouth at bedtime.      vitamin B-12 (CYANOCOBALAMIN) 500 MCG tablet Take 500 mcg by mouth daily.     No current facility-administered medications for this visit.   PHYSICAL EXAMINATION:  ECOG PERFORMANCE STATUS: 0 - Asymptomatic  Vitals:   12/23/20 1236  BP: (!) 167/61  Pulse: 84  Resp: 19  Temp: 97.8 F (36.6 C)  SpO2: 95%    Filed Weights   12/23/20 1236  Weight: 121 lb 6.4 oz (55.1 kg)   GENERAL:alert, no distress and comfortable, appears pale.  No acute distress SKIN: skin color, texture, turgor are normal, no rashes or significant lesions EYES: normal, conjunctiva are pink and non-injected, sclera clear OROPHARYNX:no exudate, no erythema and lips, buccal mucosa, and tongue normal  NECK: supple, thyroid normal size, non-tender, without nodularity LYMPH:  no palpable  lymphadenopathy in the cervical, axillary  LUNGS: clear to auscultation and percussion with normal breathing effort HEART: regular rate & rhythm and no murmurs and no lower  extremity edema ABDOMEN:abdomen soft, non-tender and normal bowel sounds Musculoskeletal:no cyanosis of digits and no clubbing  PSYCH: alert & oriented x 3 with fluent speech NEURO: no focal motor/sensory deficits  LABORATORY DATA:  I have reviewed the data as listed Lab Results  Component Value Date   WBC 7.5 09/28/2020   HGB 10.0 (L) 09/28/2020   HCT 30.4 (L) 09/28/2020   HCT 30.3 (L) 09/28/2020   MCV 94.4 09/28/2020   PLT 178 09/28/2020     Chemistry      Component Value Date/Time   NA 139 09/28/2020 1432   NA 137 03/22/2020 1439   NA 140 08/28/2013 0955   K 4.0 09/28/2020 1432   K 3.0 (LL) 08/28/2013 0955   CL 106 09/28/2020 1432   CL 111 (H) 09/03/2012 1249   CO2 22 09/28/2020 1432   CO2 16 (L) 08/28/2013 0955   BUN 43 (H) 09/28/2020 1432   BUN 42 (H) 03/22/2020 1439   BUN 75.7 (H) 08/28/2013 0955   CREATININE 1.67 (H) 09/28/2020 1432   CREATININE 5.9 (HH) 08/28/2013 0955      Component Value Date/Time   CALCIUM 9.0 09/28/2020 1432   CALCIUM 7.5 (L) 08/28/2013 0955   ALKPHOS 111 09/28/2020 1432   ALKPHOS 62 08/28/2013 0955   AST 15 09/28/2020 1432   AST 15 08/28/2013 0955   ALT 15 09/28/2020 1432   ALT 17 08/28/2013 0955   BILITOT 0.3 09/28/2020 1432   BILITOT 0.38 08/28/2013 0955     I have reviewed pertinent labs from last visit.  No evidence of hemolysis, myeloma, nutritional deficiency except for mildly low ferritin.  CKD noted.  RADIOGRAPHIC STUDIES: I have personally reviewed the radiological images as listed and agreed with the findings in the report. CT Chest Wo Contrast  Result Date: 12/03/2020 CLINICAL DATA:  Head and neck cancer.  Routine surveillance. EXAM: CT CHEST WITHOUT CONTRAST TECHNIQUE: Multidetector CT imaging of the chest was performed following the standard protocol  without IV contrast. COMPARISON:  PET-CT 07/19/2020 FINDINGS: Cardiovascular: Heart size is within normal limits. Aortic atherosclerosis. Coronary artery calcifications. No pericardial effusion. Mediastinum/Nodes: Thyroidectomy. The trachea appears patent and is midline. Normal appearance of the esophagus. No enlarged axillary, supraclavicular, or mediastinal lymph nodes. Hilar structures are suboptimally evaluated due to lack of IV contrast. Lungs/Pleura: No pleural effusion. No airspace consolidation, atelectasis, or pneumothorax. Scattered lung nodules are again noted, including: -Subpleural nodule within the posterolateral left lower lobe measures 1.2 cm, image 46/7. This is increased from 0.6 cm on 01/29/2020. More recently, on the PET-CT from 07/19/2020 this measured 0.9 cm and had an SUV max of 1.9. -Non solid nodule within the right upper lobe measures 1.5 cm, image 32/7. Unchanged from 07/19/2020. -Small calcified granuloma noted within the basilar right upper lobe. -Irregular, triangular-shaped nodule within the right lower lobe is new from the previous exam measuring 7 mm, image 73/7. -Stable tiny subpleural nodule within the posterior left apex measures 3 mm, image 16/7. Upper Abdomen: No acute abnormality noted within the imaged portions of the upper abdomen. Extensive aortic atherosclerosis. Bilateral renal cortical atrophy. Again seen is a 0.9 x 0.7 cm stone at the left UPJ without hydronephrosis. Calcifications and parenchymal atrophy throughout the pancreas compatible with changes secondary to chronic pancreatitis. Musculoskeletal: No acute or suspicious osseous findings. Degenerative changes noted within the lower thoracic and upper lumbar spine. IMPRESSION: 1. There has been interval increase in size of left lower lobe subpleural nodule which now measures 1.2 cm versus  0.6 cm on 01/29/2020 and 0.9 cm on 07/19/2020. Despite the low-level FDG uptake on previous exam (SUV max 1.9) the progressive  nature of this nodule is suspicious for either primary bronchogenic carcinoma or metastatic disease. 2. There is a new, triangular-shaped nodule within the right lower lobe measuring 7 mm. This has a nonspecific appearance and may be postinflammatory/infectious in etiology. Attention on future surveillance imaging is advised. 3. Persistent non solid nodule within the right upper lobe measures 1.5 cm. Adenocarcinoma cannot be excluded. Follow up by CT is recommended in 12 months, with continued annual surveillance for a minimum of 3 years. These recommendations are taken from: Recommendations for the Management of Subsolid Pulmonary Nodules Detected at CT: A Statement from the [facility] 2013; 266:1, 304-317. 4. Aortic atherosclerosis. Coronary artery calcifications. 5. Left UPJ calculus. No hydronephrosis. 6. Changes secondary to chronic pancreatitis. Aortic Atherosclerosis (ICD10-I70.0). Electronically Signed   By: Kerby Moors M.D.   On: 12/03/2020 08:49   NM PET Image Restag (PS) Skull Base To Thigh  Result Date: 12/15/2020 CLINICAL DATA:  Subsequent treatment strategy for head neck carcinoma. EXAM: NUCLEAR MEDICINE PET SKULL BASE TO THIGH TECHNIQUE: 5.9 mCi F-18 FDG was injected intravenously. Full-ring PET imaging was performed from the skull base to thigh after the radiotracer. CT data was obtained and used for attenuation correction and anatomic localization. Fasting blood glucose: 193 mg/dl COMPARISON:  PET-CT 07/19/2020 FINDINGS: Mediastinal blood pool activity: SUV max 2.78 Liver activity: SUV max NA NECK: No hypermetabolic activity in the oropharynx or hypopharynx. No hypermetabolic cervical lymph nodes. Incidental CT findings: none CHEST: Nodule along the posterior pleural surface of the LEFT lower lobe measures 10 mm (image 68/series 4) compared to 9 mm on comparison PET-CT (07/19/2020); however, the metabolic activity is increased with SUV max equal 3.1 compared SUV max equal  1.9. No additional pulmonary nodules. No hypermetabolic mediastinal or supraclavicular nodes. Incidental CT findings: Coronary artery calcification and aortic atherosclerotic calcification. ABDOMEN/PELVIS: No abnormal hypermetabolic activity within the liver, pancreas, adrenal glands, or spleen. No hypermetabolic lymph nodes in the abdomen or pelvis. Incidental CT findings: Renal transplants in the RIGHT iliac fossa. Extensive sigmoid diverticulosis without evidence acute inflammation SKELETON: No focal hypermetabolic activity to suggest skeletal metastasis. Incidental CT findings: none IMPRESSION: 1. Increase in metabolic activity of persistent nodule in the LEFT lower lobe. Findings concerning for malignancy (primary bronchogenic carcinoma versus metastatic lesion.) Recommend tissue sampling. 2. No evidence of head neck cancer recurrence within the hypopharynx. No hypermetabolic or enlarged lymph nodes in the neck. Electronically Signed   By: Suzy Bouchard M.D.   On: 12/15/2020 10:59    All questions were answered. The patient knows to call the clinic with any problems, questions or concerns. I spent 20 minutes in the care of this patient including H and P, review of records, counseling and coordination of care.     Benay Pike, MD 12/23/2020 12:41 PM

## 2020-12-27 ENCOUNTER — Ambulatory Visit: Payer: Medicare Other | Admitting: Radiation Oncology

## 2020-12-27 ENCOUNTER — Ambulatory Visit
Admission: RE | Admit: 2020-12-27 | Discharge: 2020-12-27 | Disposition: A | Discharge status: Home / Self Care | Payer: Medicare Other | Source: Ambulatory Visit | Attending: Radiation Oncology | Admitting: Radiation Oncology

## 2020-12-27 DIAGNOSIS — C3432 Malignant neoplasm of lower lobe, left bronchus or lung: Secondary | ICD-10-CM | POA: Insufficient documentation

## 2020-12-27 DIAGNOSIS — Z510 Encounter for antineoplastic radiation therapy: Secondary | ICD-10-CM | POA: Insufficient documentation

## 2020-12-28 ENCOUNTER — Ambulatory Visit (HOSPITAL_BASED_OUTPATIENT_CLINIC_OR_DEPARTMENT_OTHER): Payer: Medicare Other | Admitting: Cardiology

## 2020-12-29 ENCOUNTER — Ambulatory Visit: Payer: Medicare Other

## 2020-12-29 DIAGNOSIS — Z510 Encounter for antineoplastic radiation therapy: Secondary | ICD-10-CM | POA: Diagnosis not present

## 2020-12-31 ENCOUNTER — Ambulatory Visit: Payer: Medicare Other | Admitting: Radiation Oncology

## 2021-01-03 ENCOUNTER — Ambulatory Visit: Payer: Medicare Other | Admitting: Radiation Oncology

## 2021-01-04 ENCOUNTER — Other Ambulatory Visit: Payer: Self-pay

## 2021-01-04 ENCOUNTER — Ambulatory Visit
Admission: RE | Admit: 2021-01-04 | Discharge: 2021-01-04 | Disposition: A | Discharge status: Home / Self Care | Payer: Medicare Other | Source: Ambulatory Visit | Attending: Radiation Oncology | Admitting: Radiation Oncology

## 2021-01-04 DIAGNOSIS — Z510 Encounter for antineoplastic radiation therapy: Secondary | ICD-10-CM | POA: Diagnosis not present

## 2021-01-05 ENCOUNTER — Ambulatory Visit: Payer: Medicare Other | Admitting: Radiation Oncology

## 2021-01-06 ENCOUNTER — Ambulatory Visit: Payer: Medicare Other | Admitting: Radiation Oncology

## 2021-01-06 ENCOUNTER — Ambulatory Visit
Admission: RE | Admit: 2021-01-06 | Discharge: 2021-01-06 | Disposition: A | Discharge status: Home / Self Care | Payer: Medicare Other | Source: Ambulatory Visit | Attending: Radiation Oncology | Admitting: Radiation Oncology

## 2021-01-06 ENCOUNTER — Other Ambulatory Visit: Payer: Self-pay

## 2021-01-06 DIAGNOSIS — Z510 Encounter for antineoplastic radiation therapy: Secondary | ICD-10-CM | POA: Diagnosis not present

## 2021-01-07 ENCOUNTER — Ambulatory Visit: Payer: Medicare Other | Admitting: Radiation Oncology

## 2021-01-10 ENCOUNTER — Ambulatory Visit
Admission: RE | Admit: 2021-01-10 | Discharge: 2021-01-10 | Disposition: A | Discharge status: Home / Self Care | Payer: Medicare Other | Source: Ambulatory Visit | Attending: Radiation Oncology | Admitting: Radiation Oncology

## 2021-01-10 ENCOUNTER — Other Ambulatory Visit: Payer: Self-pay

## 2021-01-10 ENCOUNTER — Ambulatory Visit: Payer: Medicare Other | Admitting: Radiation Oncology

## 2021-01-10 DIAGNOSIS — Z510 Encounter for antineoplastic radiation therapy: Secondary | ICD-10-CM | POA: Diagnosis not present

## 2021-01-11 ENCOUNTER — Ambulatory Visit: Payer: Medicare Other | Admitting: Radiation Oncology

## 2021-01-12 ENCOUNTER — Other Ambulatory Visit: Payer: Self-pay

## 2021-01-12 ENCOUNTER — Ambulatory Visit
Admission: RE | Admit: 2021-01-12 | Discharge: 2021-01-12 | Disposition: A | Discharge status: Home / Self Care | Payer: Medicare Other | Source: Ambulatory Visit | Attending: Radiation Oncology | Admitting: Radiation Oncology

## 2021-01-12 DIAGNOSIS — Z510 Encounter for antineoplastic radiation therapy: Secondary | ICD-10-CM | POA: Diagnosis not present

## 2021-01-13 ENCOUNTER — Ambulatory Visit: Payer: Medicare Other | Admitting: Radiation Oncology

## 2021-01-14 ENCOUNTER — Ambulatory Visit
Admission: RE | Admit: 2021-01-14 | Discharge: 2021-01-14 | Disposition: A | Discharge status: Home / Self Care | Payer: Medicare Other | Source: Ambulatory Visit | Attending: Radiation Oncology | Admitting: Radiation Oncology

## 2021-01-14 ENCOUNTER — Encounter: Payer: Self-pay | Admitting: Radiation Oncology

## 2021-01-14 ENCOUNTER — Other Ambulatory Visit: Payer: Self-pay

## 2021-01-14 DIAGNOSIS — Z510 Encounter for antineoplastic radiation therapy: Secondary | ICD-10-CM | POA: Diagnosis not present

## 2021-01-17 ENCOUNTER — Ambulatory Visit: Payer: Medicare Other | Admitting: Radiation Oncology

## 2021-01-19 ENCOUNTER — Ambulatory Visit: Payer: Medicare Other | Admitting: Radiation Oncology

## 2021-01-21 ENCOUNTER — Ambulatory Visit: Payer: Medicare Other | Admitting: Radiation Oncology

## 2021-02-07 ENCOUNTER — Telehealth: Payer: Self-pay | Admitting: Cardiology

## 2021-02-09 ENCOUNTER — Encounter: Payer: Self-pay | Admitting: Cardiology

## 2021-02-09 ENCOUNTER — Other Ambulatory Visit: Payer: Self-pay

## 2021-02-09 ENCOUNTER — Ambulatory Visit (INDEPENDENT_AMBULATORY_CARE_PROVIDER_SITE_OTHER): Payer: Medicare Other | Admitting: Cardiology

## 2021-02-09 DIAGNOSIS — C140 Malignant neoplasm of pharynx, unspecified: Secondary | ICD-10-CM | POA: Diagnosis not present

## 2021-02-09 DIAGNOSIS — I6523 Occlusion and stenosis of bilateral carotid arteries: Secondary | ICD-10-CM

## 2021-02-09 DIAGNOSIS — Z79899 Other long term (current) drug therapy: Secondary | ICD-10-CM | POA: Diagnosis not present

## 2021-02-09 DIAGNOSIS — Z9221 Personal history of antineoplastic chemotherapy: Secondary | ICD-10-CM | POA: Diagnosis not present

## 2021-02-09 DIAGNOSIS — R600 Localized edema: Secondary | ICD-10-CM | POA: Diagnosis not present

## 2021-02-09 DIAGNOSIS — E7800 Pure hypercholesterolemia, unspecified: Secondary | ICD-10-CM | POA: Insufficient documentation

## 2021-02-09 MED ORDER — ROSUVASTATIN CALCIUM 5 MG PO TABS: 5.0000 mg | ORAL_TABLET | ORAL | 3 refills | Status: DC

## 2021-02-09 MED ORDER — FUROSEMIDE 40 MG PO TABS: 40.0000 mg | ORAL_TABLET | Freq: Every day | ORAL | 3 refills | Status: DC

## 2021-02-09 NOTE — Patient Instructions (Signed)
Medication Instructions:   TAKE ROSUVASTATIN (CRESTOR) 5 MG BY MOUTH TWICE WEEKLY--TAKE ON MONDAYS AND THURSDAYS  *If you need a refill on your cardiac medications before your next appointment, please call your pharmacy*   Follow-Up: At [facility], you and your health needs are our priority.  As part of our continuing mission to provide you with exceptional heart care, we have created designated Provider Care Teams.  These Care Teams include your primary Cardiologist (physician) and Advanced Practice Providers (APPs -  Physician Assistants and Nurse Practitioners) who all work together to provide you with the care you need, when you need it.  We recommend signing up for the patient portal called "MyChart".  Sign up information is provided on this After Visit Summary.  MyChart is used to connect with patients for Virtual Visits (Telemedicine).  Patients are able to view lab/test results, encounter notes, upcoming appointments, etc.  Non-urgent messages can be sent to your provider as well.   To learn more about what you can do with MyChart, go to [email or web address].    Your next appointment:   1 year(s)  The format for your next appointment:   In Person  Provider:    DR. MARK SKAINS None

## 2021-02-09 NOTE — Assessment & Plan Note (Signed)
Mild plaque bilaterally.  Continue with Crestor 5 mg Monday and Thursday.  Blood pressure control.

## 2021-02-09 NOTE — Assessment & Plan Note (Signed)
Continuing with Crestor 5 mg twice a week.  No changes made.  No myalgias.

## 2021-02-09 NOTE — Progress Notes (Signed)
Cardiology Office Note:    Date:  02/09/2021   ID:  Anita Pollard, DOB April 15, 1940, MRN [id number]  PCP:  Lawerance Cruel, MD  Cardiologist:  None    Referring MD: Lawerance Cruel, MD    History of Present Illness:     Anita Pollard is a 80 y.o. female with a hx of hypertension, anemia, GERD, diabetes, hypothyroidism, and depression coming in today for follow-up for CAD and HTN.  Prior double kidney transplant.  Prior patient of Dr. Lia Foyer.  Transplant was at [facility].  Following aortic valve murmur.  On Crestor for hyperlipidemia.  Carotid disease noted.  Today, she is doing well. 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   She broke her R knee cap in 1997 on black ice and had 2 pins inserted. She endorses bilateral leg swelling. However, her R leg tends to swell more than her L leg.   She recently completed radiation for her L lung. There was a spot on the bottom of her L lung.   She is still taking 0.58m Crestor on Mondays and Fridays.   She enjoys kFirefighter  She denies any palpitations, chest pain, or shortness of breath. No lightheadedness, headaches, syncope, orthopnea, PND, or exertional symptoms.  Past Medical History:  Diagnosis Date   Alcohol abuse    Anemia    Arthritis    Breast cancer ([place]   Right   Chronic kidney disease    Colon polyp    Depression    Diabetes mellitus    GERD (gastroesophageal reflux disease)    Heart murmur    History of kidney stones 1976   HTN (hypertension)    Hypothyroidism    Osteopenia    Pancreatitis    Ulcerative esophagitis     Past Surgical History:  Procedure Laterality Date   ABDOMINAL HYSTERECTOMY     ABDOMINOPLASTY     APPENDECTOMY     BREAST BIOPSY Right 08/03/2008   Stereo Bx, Malignant   BREAST LUMPECTOMY Right 08/26/2008   CATARACT EXTRACTION W/ INTRAOCULAR LENS  IMPLANT, BILATERAL     CO2 LASER APPLICATION N/A 163/03/6008  Procedure: CO2 LASER  APPLICATION;  Surgeon: BMelida Quitter MD;  [facility]  Service: ENT;  Laterality: N/A;   COSMETIC SURGERY     ductal carcinoma     right breast    EYE SURGERY     HIP ARTHROPLASTY Right    INSERTION OF DIALYSIS CATHETER Right 09/25/2012   Procedure: INSERTION OF DIALYSIS CATHETER;  Surgeon: JMal Misty MD;  [place] [facility]  Service: Vascular;  Laterality: Right;  Righ Internal Jugular Placement   IR GASTROSTOMY TUBE MOD SED  02/10/2020   IR GASTROSTOMY TUBE REMOVAL  05/25/2020   JOINT REPLACEMENT     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   MICROLARYNGOSCOPY N/A 10/20/2019   Procedure: Suspended Microlaryngoscopy with Biopsy;  Surgeon: BMelida Quitter MD;  [place]  Service: ENT;  Laterality: N/A;   MICROLARYNGOSCOPY N/A 01/09/2020   Procedure: MICROLARYNGOSCOPY WITH BIOPSY;  Surgeon: BMelida Quitter MD;  [place] [facility]  Service: ENT;  Laterality: N/A;   RIGID ESOPHAGOSCOPY N/A 10/20/2019   Procedure: RIGID ESOPHAGOSCOPY;  Surgeon: BMelida Quitter MD;  [facility]  Service: ENT;  Laterality: N/A;   THYROIDECTOMY     TUMMY TUCK      Current Medications: Current Meds  Medication Sig   amLODipine (NORVASC) 10 MG tablet Take 10 mg by mouth at  bedtime.   Cholecalciferol (VITAMIN D3) 5000 UNITS TABS Take 5,000 Units by mouth 3 (three) times daily after meals.    ferrous sulfate 324 MG TBEC Take 324 mg by mouth.   glipiZIDE (GLUCOTROL XL) 2.5 MG 24 hr tablet Take 5 mg by mouth 2 (two) times daily.    insulin glargine, 1 Unit Dial, (TOUJEO SOLOSTAR) 300 UNIT/ML Solostar Pen 5 Units.   Lancets (FREESTYLE) lancets by Does not apply route.   levothyroxine (SYNTHROID) 200 MCG tablet Take 200 mcg by mouth daily before breakfast.    losartan (COZAAR) 25 MG tablet Take 25 mg by mouth at bedtime.    Melatonin 5 MG CAPS Take 5 mg by mouth See admin instructions. Take 5 mg 2 hours before bedtime   omeprazole (PRILOSEC) 20 MG capsule Take 20 mg by mouth daily.   Pancrelipase,  Lip-Prot-Amyl, (CREON) 3000-9500 units CPEP Take 1 capsule by mouth 3 (three) times daily with meals.    Polyethyl Glycol-Propyl Glycol (SYSTANE OP) Place 1 drop into both eyes every evening.   pramipexole (MIRAPEX) 0.25 MG tablet Take 0.25-0.5 mg by mouth See admin instructions. Take 0.5 mg 2 hours prior to bedtime then 0.25 mg at bedtime   predniSONE (DELTASONE) 10 MG tablet Take 10 mg by mouth daily with breakfast.    raloxifene (EVISTA) 60 MG tablet Take 60 mg by mouth daily.   [START ON 02/10/2021] rosuvastatin (CRESTOR) 5 MG tablet Take 1 tablet (5 mg total) by mouth 2 (two) times a week. Take on Mondays and Thursdays.   tacrolimus (PROGRAF) 0.5 MG capsule Take 0.5 mg by mouth every 12 (twelve) hours.    tacrolimus (PROGRAF) 1 MG capsule Take 1 mg by mouth every 12 (twelve) hours.    temazepam (RESTORIL) 15 MG capsule Take 15-30 mg by mouth at bedtime.    vitamin B-12 (CYANOCOBALAMIN) 500 MCG tablet Take 500 mcg by mouth daily.   [DISCONTINUED] furosemide (LASIX) 40 MG tablet Take 1 tablet (40 mg total) by mouth daily. Please keep appointment with Dr. Oval Linsey in September for future refills.Take 1 tablet (40 mg total) by mouth daily. Please keep appointment with Dr. Oval Linsey in September for future refills. PLEASE KEEP UPCOMING APPOINTMENT WITH DR. Marlou Porch IN October 2022 FOR FUTURE REFILLS. THANK YOU     Allergies:   Penicillins, Banana, Detrol [tolterodine], Quetiapine, Risperidone and related, and Lisinopril   Social History   Socioeconomic History   Marital status: Widowed    Spouse name: Not on file   Number of children: Not on file   Years of education: Not on file   Highest education level: Not on file  Occupational History   Not on file  Tobacco Use   Smoking status: Former    Types: Cigarettes    Quit date: 09/25/1983    Years since quitting: 37.4   Smokeless tobacco: Never  Vaping Use   Vaping Use: Never used  Substance and Sexual Activity   Alcohol use: Yes     Comment: socially history of heavy drinking with intervention x 2-at least 2 occasions since april '14   Drug use: No   Sexual activity: Never  Other Topics Concern   Not on file  Social History Narrative   Not on file   Social Determinants of Health   Financial Resource Strain: Not on file  Food Insecurity: Not on file  Transportation Needs: Not on file  Physical Activity: Not on file  Stress: Not on file  Social Connections: Not on file  Family History: The patient's family history includes Atrial fibrillation in her brother; Colon polyps in her brother; Dementia in her sister; Diabetes in her brother and father; Hypertension in her father, mother, and sister; Multiple myeloma in her sister; Thyroid cancer (age of onset: 17) in her daughter.  ROS:   Please see the history of present illness.    (+) Bilateral leg swelling (R>L) All other systems reviewed and negative.   EKGs/Labs/Other Studies Reviewed:    The following studies were reviewed today: Carotid Arterial Duplex 06/03/20 Right Carotid: Velocities in the right ICA are consistent with a 1-39%  stenosis. Non-hemodynamically significant plaque <50% noted in the  CCA. Stable RICA velocities.  Left Carotid: Velocities in the left ICA are consistent with a 1-39%  stenosis. Stable LICA velocities.  Vertebrals: Bilateral vertebral arteries demonstrate antegrade flow.  Subclavians: Normal flow hemodynamics were seen in bilateral subclavian arteries.   Echo 03/18/20 1. Left ventricular ejection fraction, by estimation, is 70 to 75%. The  left ventricle has hyperdynamic function. The left ventricle has no  regional wall motion abnormalities. Left ventricular diastolic parameters  are consistent with Grade I diastolic dysfunction (impaired relaxation). The average left ventricular global longitudinal strain is 28.3 %. The global longitudinal strain is normal.   2. Right ventricular systolic function is normal. The right  ventricular  size is normal. There is normal pulmonary artery systolic pressure.   3. The mitral valve is grossly normal. Trivial mitral valve  regurgitation.   4. The aortic valve was not well visualized. Aortic valve regurgitation  is not visualized. Mild aortic valve sclerosis is present, with no  evidence of aortic valve stenosis.   5. The inferior vena cava is normal in size with greater than 50%  respiratory variability, suggesting right atrial pressure of 3 mmHg.  Comparison(s): No significant change from prior study. 05/08/2018: LVEF  >65%, grade 1 DD.   EKG:  EKG was not ordered today  Recent Labs: 03/22/2020: NT-Pro BNP 313 09/28/2020: ALT 15; BUN 43; Creatinine 1.67; Hemoglobin 10.0; Platelets 178; Potassium 4.0; Sodium 139   Recent Lipid Panel    Component Value Date/Time   CHOL 233 (H) 05/01/2018 0749   TRIG 112 05/01/2018 0749   HDL 145 05/01/2018 0749   CHOLHDL 1.6 05/01/2018 0749   LDLCALC 66 05/01/2018 0749    CHA2DS2-VASc Score =   '[ ]' .  Therefore, the patient's annual risk of stroke is   %.        Physical Exam:    VS:  BP 140/60 (BP Location: Left Arm, Patient Position: Sitting, Cuff Size: Normal)   Pulse 68   Ht '5\' 1"'  (1.549 m)   Wt 124 lb (56.2 kg)   SpO2 96%   BMI 23.43 kg/m     Wt Readings from Last 3 Encounters:  02/09/21 124 lb (56.2 kg)  12/23/20 121 lb 6.4 oz (55.1 kg)  11/10/20 128 lb 1 oz (58.1 kg)     GEN: Well nourished, well developed in no acute distress HEENT: Normal NECK: No JVD; No carotid bruits LYMPHATICS: No lymphadenopathy CARDIAC: RRR, 1/6 systolic murmur, no rubs, gallops RESPIRATORY:  Clear to auscultation without rales, wheezing or rhonchi  ABDOMEN: Soft, non-tender, non-distended MUSCULOSKELETAL:  No edema; No deformity  SKIN: Warm and dry NEUROLOGIC:  Alert and oriented x 3 PSYCHIATRIC:  Normal affect   ASSESSMENT:    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 History of chemotherapy   3. Bilateral lower extremity edema  4. Edema of both upper extremities   5. Medication management   6. Bilateral carotid artery stenosis   7. Lower extremity edema   8. Pure hypercholesterolemia    PLAN:    Carotid artery disease (HCC) Mild plaque bilaterally.  Continue with Crestor 5 mg Monday and Thursday.  Blood pressure control.  Lower extremity edema In part likely venous insufficiency, dependent edema as well as a side effect of amlodipine.  Compression stockings and leg elevation.  She does take increased salt as prescribed by nephrology.  This could be contributing as well.  She is taking furosemide 40 mg once a day.  Pure hypercholesterolemia Continuing with Crestor 5 mg twice a week.  No changes made.  No myalgias.      Medication Adjustments/Labs and Tests Ordered: Current medicines are reviewed at length with the patient today.  Concerns regarding medicines are outlined above.  No orders of the defined types were placed in this encounter.  Meds ordered this encounter  Medications   furosemide (LASIX) 40 MG tablet    Sig: Take 1 tablet (40 mg total) by mouth daily.    Dispense:  90 tablet    Refill:  3   rosuvastatin (CRESTOR) 5 MG tablet    Sig: Take 1 tablet (5 mg total) by mouth 2 (two) times a week. Take on Mondays and Thursdays.    Dispense:  30 tablet    Refill:  3    I,Mykaella Javier,acting as a scribe for [employer], MD.,have documented all relevant documentation on the behalf of Candee Furbish, MD,as directed by  Candee Furbish, MD while in the presence of Candee Furbish, MD.  I, Candee Furbish, MD, have reviewed all documentation for this visit. The documentation on 02/09/21 for the exam, diagnosis, procedures, and orders are all accurate and complete.   Signed, Candee Furbish, MD  02/09/2021 5:30 PM    [place]

## 2021-02-09 NOTE — Assessment & Plan Note (Signed)
In part likely venous insufficiency, dependent edema as well as a side effect of amlodipine.  Compression stockings and leg elevation.  She does take increased salt as prescribed by nephrology.  This could be contributing as well.  She is taking furosemide 40 mg once a day.

## 2021-02-16 ENCOUNTER — Telehealth: Payer: Self-pay

## 2021-02-16 ENCOUNTER — Other Ambulatory Visit: Payer: Self-pay

## 2021-02-16 ENCOUNTER — Ambulatory Visit: Payer: Medicare Other | Admitting: Radiation Oncology

## 2021-02-16 DIAGNOSIS — R911 Solitary pulmonary nodule: Secondary | ICD-10-CM

## 2021-02-16 NOTE — Telephone Encounter (Signed)
I called the patient today about their upcoming follow-up appointment in radiation oncology.   Given the state of the  COVID-19 pandemic, concerning case numbers in our community, and guidance from [facility], I offered a phone assessment with the patient to determine if coming to the clinic was necessary. The patient accepted.  I let the patient know that I had spoken with Dr. Isidore Moos, and she wanted them to know the importance of washing their hands for at least 20 seconds at a time, especially after going out in public, and before they eat. Limit going out in public whenever possible. Do not touch your face, unless your hands are clean, such as when bathing. Get plenty of rest, eat well, and stay hydrated. Patient verbalized understanding and agreement  Symptomatically, the patient is doing relatively well and denies any new or worsening respiratory concerns. Reports she received her 3rd booster a couple months ago and felt poorly afterwards, but has since recovered.   All questions were answered to the patient's satisfaction.  I encouraged the patient to call with any further questions. Otherwise, the plan is have chest CT without contrast in 2 months and follow-up with Dr. Isidore Moos afterwards to review results.    Patient is pleased with this plan, and we will cancel their upcoming follow-up to reduce the risk of COVID-19 transmission.

## 2021-03-02 NOTE — Progress Notes (Signed)
                                                                                                                                                             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, left bronchus or lung Z85.818-Personal history of malignant neoplasm of other sites of lip, oral cavity, and pharynx  Cancer Staging:  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([place]) Staging form: Larynx - Supraglottis, AJCC 8th Edition - Clinical stage from 01/20/2020: Stage II (cT2, cN0, cM0) - Signed by Eppie Gibson, MD on 06/22/2020 Stage prefix: Initial diagnosis  Second primary vs oligometastasis chest  Intent: Curative  Radiation Treatment Dates: 01/04/2021 through 01/14/2021 Site Technique Total Dose (Gy) Dose per Fx (Gy) Completed Fx Beam Energies  Lung, Left: Lung_Lt_lower IMRT 60/60 12 5/5 6XFFF   Narrative: The patient tolerated radiation therapy relatively well.   Plan: The patient will follow-up with radiation oncology in 1mo. -----------------------------------  Eppie Gibson, MD

## 2021-03-03 ENCOUNTER — Other Ambulatory Visit: Payer: Self-pay | Admitting: Physician Assistant

## 2021-03-03 DIAGNOSIS — D649 Anemia, unspecified: Secondary | ICD-10-CM

## 2021-03-04 ENCOUNTER — Telehealth: Payer: Self-pay

## 2021-03-04 NOTE — Telephone Encounter (Signed)
Ms. Anita Pollard reached out asking to have her labs completed at [facility] so that she didn't have to come here. Murray Hodgkins, [place] sent the orders to [facility]. I called [facility] to confirm and they stated that they have them and that Anita Pollard has to call and schedule the appt. I relayed this information to Anita Pollard and went over her follow up appt with Dr. Rob Bunting on 12/22 as a phone visit. Understanding and gratitude verbalized. All questions answered. I advised for her to call back with any questions/concerns.

## 2021-03-09 ENCOUNTER — Telehealth: Payer: Self-pay | Admitting: *Deleted

## 2021-03-09 NOTE — Telephone Encounter (Signed)
Received vm message from pt. She states that she went to [facility] for labs today and was told they did not have any orders .   Please  advise

## 2021-03-11 ENCOUNTER — Emergency Department (HOSPITAL_COMMUNITY)
Admission: EM | Admit: 2021-03-11 | Discharge: 2021-03-11 | Disposition: A | Discharge status: Home / Self Care | Payer: Medicare Other | Attending: Emergency Medicine | Admitting: Emergency Medicine

## 2021-03-11 ENCOUNTER — Emergency Department (HOSPITAL_COMMUNITY): Payer: Medicare Other

## 2021-03-11 ENCOUNTER — Other Ambulatory Visit: Payer: Self-pay

## 2021-03-11 ENCOUNTER — Encounter (HOSPITAL_COMMUNITY): Payer: Self-pay | Admitting: Emergency Medicine

## 2021-03-11 DIAGNOSIS — E1122 Type 2 diabetes mellitus with diabetic chronic kidney disease: Secondary | ICD-10-CM | POA: Diagnosis not present

## 2021-03-11 DIAGNOSIS — N189 Chronic kidney disease, unspecified: Secondary | ICD-10-CM | POA: Diagnosis not present

## 2021-03-11 DIAGNOSIS — Z87891 Personal history of nicotine dependence: Secondary | ICD-10-CM | POA: Insufficient documentation

## 2021-03-11 DIAGNOSIS — S81011A Laceration without foreign body, right knee, initial encounter: Secondary | ICD-10-CM | POA: Diagnosis not present

## 2021-03-11 DIAGNOSIS — S8991XA Unspecified injury of right lower leg, initial encounter: Secondary | ICD-10-CM | POA: Diagnosis present

## 2021-03-11 DIAGNOSIS — Z8521 Personal history of malignant neoplasm of larynx: Secondary | ICD-10-CM | POA: Insufficient documentation

## 2021-03-11 DIAGNOSIS — I129 Hypertensive chronic kidney disease with stage 1 through stage 4 chronic kidney disease, or unspecified chronic kidney disease: Secondary | ICD-10-CM | POA: Insufficient documentation

## 2021-03-11 DIAGNOSIS — Y92009 Unspecified place in unspecified non-institutional (private) residence as the place of occurrence of the external cause: Secondary | ICD-10-CM | POA: Diagnosis not present

## 2021-03-11 DIAGNOSIS — W010XXA Fall on same level from slipping, tripping and stumbling without subsequent striking against object, initial encounter: Secondary | ICD-10-CM | POA: Insufficient documentation

## 2021-03-11 DIAGNOSIS — Z23 Encounter for immunization: Secondary | ICD-10-CM | POA: Insufficient documentation

## 2021-03-11 DIAGNOSIS — Z7984 Long term (current) use of oral hypoglycemic drugs: Secondary | ICD-10-CM | POA: Insufficient documentation

## 2021-03-11 DIAGNOSIS — S51011A Laceration without foreign body of right elbow, initial encounter: Secondary | ICD-10-CM | POA: Insufficient documentation

## 2021-03-11 DIAGNOSIS — Z853 Personal history of malignant neoplasm of breast: Secondary | ICD-10-CM | POA: Insufficient documentation

## 2021-03-11 DIAGNOSIS — Z79899 Other long term (current) drug therapy: Secondary | ICD-10-CM | POA: Diagnosis not present

## 2021-03-11 DIAGNOSIS — W19XXXA Unspecified fall, initial encounter: Secondary | ICD-10-CM

## 2021-03-11 MED ORDER — TETANUS-DIPHTH-ACELL PERTUSSIS 5-2.5-18.5 LF-MCG/0.5 IM SUSY
0.5000 mL | PREFILLED_SYRINGE | Freq: Once | INTRAMUSCULAR | Status: AC
  Administered 2021-03-11: 0.5 mL via INTRAMUSCULAR
  Filled 2021-03-11: qty 0.5

## 2021-03-11 MED ORDER — LIDOCAINE-EPINEPHRINE (PF) 2 %-1:200000 IJ SOLN
20.0000 mL | Freq: Once | INTRAMUSCULAR | Status: AC
  Administered 2021-03-11: 20 mL
  Filled 2021-03-11: qty 20

## 2021-03-11 MED ORDER — DOXYCYCLINE HYCLATE 100 MG PO CAPS: 100.0000 mg | ORAL_CAPSULE | Freq: Two times a day (BID) | ORAL | 0 refills | Status: DC

## 2021-03-11 NOTE — Discharge Instructions (Signed)
You were seen in the emergency department for evaluation of injuries from a fall.  You had x-rays of your right knee that did not show any fracture.  Your right knee laceration was sutured and these will need to be removed in 10 to 14 days.  Your right elbow wound was Steri-Stripped.  You can use soap and water to these areas.  Tylenol for pain.  Return if any signs of infection.

## 2021-03-11 NOTE — ED Triage Notes (Signed)
BIBA Per EMS: Pt coming from home w/ c/o fall 40 mins ago. Pt tripped over phone cable. 4in lac on right knee. Skin tear to right elbow. Denies hitting head. Denies LOC. Denies blood thinners 180/80  85 HR  98%

## 2021-03-11 NOTE — ED Provider Notes (Signed)
[facility] DEPT Provider Note   CSN: [id number] Arrival date & time: 03/11/21  1148     History Chief Complaint  Patient presents with   Fall   Knee Injury    Anita Pollard is a 80 y.o. female.  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  She tripped at home and sustained a laceration to her right knee and right elbow.  She denies any loss of consciousness.  She has been ambulatory since the fall.  No head or neck injury.  Not on blood thinners.  The history is provided by the patient.  Fall This is a new problem. The current episode started 1 to 2 hours ago. The problem has not changed since onset.Pertinent negatives include no chest pain, no abdominal pain, no headaches and no shortness of breath. The symptoms are aggravated by bending and twisting. Nothing relieves the symptoms. She has tried nothing for the symptoms. The treatment provided no relief.      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   Pelvic mass in female 06/05/2013   DM2 (diabetes mellitus, type 2) ([place]) 09/24/2012   Elevated serum creatinine 09/03/2012   Normocytic normochromic anemia 09/03/2012   Breast cancer ([place]) 08/31/2011   HYPERTENSION, BENIGN 02/28/2010   Carotid artery disease ([place]) 01/26/2010    Past Surgical History:  Procedure Laterality Date   ABDOMINAL HYSTERECTOMY     ABDOMINOPLASTY      APPENDECTOMY     BREAST BIOPSY Right 08/03/2008   Stereo Bx, Malignant   BREAST LUMPECTOMY Right 08/26/2008   CATARACT EXTRACTION W/ INTRAOCULAR LENS  IMPLANT, BILATERAL     CO2 LASER APPLICATION N/A 78/29/5621   Procedure: CO2 LASER APPLICATION;  Surgeon: Melida Quitter, MD;  Location[facility] OR;  Service: ENT;  Laterality: N/A;   COSMETIC SURGERY     ductal carcinoma     right breast    EYE SURGERY     HIP ARTHROPLASTY Right    INSERTION OF DIALYSIS CATHETER Right 09/25/2012   Procedure: INSERTION OF DIALYSIS CATHETER;  Surgeon: Mal Misty, MD;  Location: [facility] OR;  Service: Vascular;  Laterality: Right;  Righ Internal Jugular Placement   IR GASTROSTOMY TUBE MOD SED  02/10/2020   IR GASTROSTOMY TUBE REMOVAL  05/25/2020   JOINT REPLACEMENT     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   MICROLARYNGOSCOPY N/A 10/20/2019   Procedure: Suspended Microlaryngoscopy with Biopsy;  Surgeon: Melida Quitter, MD;  Location: [place];  Service: ENT;  Laterality: N/A;   MICROLARYNGOSCOPY N/A 01/09/2020   Procedure: MICROLARYNGOSCOPY WITH BIOPSY;  Surgeon: Melida Quitter, MD;  Location: [place];  Service: ENT;  Laterality: N/A;   RIGID ESOPHAGOSCOPY N/A 10/20/2019   Procedure: RIGID ESOPHAGOSCOPY;  Surgeon: Melida Quitter, MD;  Location: [place];  Service: ENT;  Laterality: N/A;   THYROIDECTOMY     TUMMY TUCK       OB History   No obstetric history on file.     Family History  Problem Relation Age of Onset   Thyroid cancer Daughter 45   Diabetes Father    Hypertension Father    Diabetes Brother    Hypertension Mother    Hypertension Sister    Colon polyps Brother    Atrial fibrillation Brother    Dementia Sister    Multiple myeloma Sister     Social History   Tobacco Use   Smoking status: Former    Types: Cigarettes    Quit date: 09/25/1983    Years since quitting: 37.4   Smokeless tobacco: Never  Vaping Use   Vaping Use: Never used  Substance Use Topics   Alcohol use: Yes    Comment: socially  history of heavy drinking with intervention x 2-at least 2 occasions since april '14   Drug use: No    Home Medications Prior to Admission medications   Medication Sig Start Date End Date Taking? Authorizing Provider  amLODipine (NORVASC) 10 MG tablet Take 10 mg by mouth at bedtime.    [provider]  Cholecalciferol (VITAMIN D3) 5000 UNITS TABS Take 5,000 Units by mouth 3 (three) times daily after meals.     [provider]  ferrous sulfate 324 MG TBEC Take 324 mg by mouth.    [provider]  furosemide (LASIX) 40 MG tablet Take 1 tablet (40 mg total) by mouth daily. 02/09/21   Jerline Pain, MD  glipiZIDE (GLUCOTROL XL) 2.5 MG 24 hr tablet Take 5 mg by mouth 2 (two) times daily.  01/01/14   [provider]  insulin glargine, 1 Unit Dial, (TOUJEO SOLOSTAR) 300 UNIT/ML Solostar Pen 5 Units. 09/02/20   [provider]  Lancets (FREESTYLE) lancets by Does not apply route.    [provider]  levothyroxine (SYNTHROID) 200 MCG tablet Take 200 mcg by mouth daily before breakfast.     [provider]  losartan (COZAAR) 25 MG tablet Take 25 mg by mouth at bedtime.  07/09/16   [provider]  Melatonin 5 MG CAPS Take 5 mg by mouth See admin instructions. Take 5 mg 2 hours before bedtime    [provider]  omeprazole (PRILOSEC) 20 MG capsule Take 20 mg by mouth daily.    [provider]  Pancrelipase, Lip-Prot-Amyl, (CREON) 3000-9500 units CPEP Take 1 capsule by mouth 3 (three) times daily with meals.     [provider]  Polyethyl Glycol-Propyl Glycol (SYSTANE OP) Place 1 drop into both eyes every evening.    [provider]  pramipexole (MIRAPEX) 0.25 MG tablet Take 0.25-0.5 mg by mouth See admin instructions. Take 0.5 mg 2 hours prior to bedtime then 0.25 mg at bedtime    [provider]  predniSONE (DELTASONE) 10 MG tablet Take 10 mg by mouth daily with breakfast.     [provider]  raloxifene (EVISTA) 60 MG tablet Take 60 mg by mouth daily.    [provider]  rosuvastatin (CRESTOR) 5 MG tablet Take 1 tablet (5 mg total) by mouth 2 (two) times a week. Take on Mondays and Thursdays. 02/10/21   Jerline Pain, MD  tacrolimus (PROGRAF) 0.5 MG capsule Take 0.5 mg by mouth every 12 (twelve) hours.     [provider]  tacrolimus (PROGRAF) 1 MG capsule Take 1 mg  by mouth every 12 (twelve) hours.  10/02/14   [provider]  temazepam (RESTORIL) 15 MG capsule Take 15-30 mg by mouth at bedtime.  05/20/13   [provider]  vitamin B-12 (CYANOCOBALAMIN) 500 MCG tablet Take 500 mcg by mouth daily.    [provider]    Allergies    Penicillins, Banana, Detrol [tolterodine], Quetiapine, Risperidone and related, and Lisinopril  Review of Systems   Review of Systems  Constitutional:  Negative for fever.  HENT:  Negative for sore throat.   Eyes:  Negative for visual disturbance.  Respiratory:  Negative for shortness of breath.   Cardiovascular:  Negative for chest pain.  Gastrointestinal:  Negative for abdominal pain.  Genitourinary:  Negative for dysuria.  Musculoskeletal:  Negative for neck pain.  Skin:  Positive for wound.  Neurological:  Negative for headaches.   Physical Exam Updated Vital Signs BP (!) 177/61 (BP Location: Left Arm)    Pulse 74    Temp 97.8 F (36.6 C) (Oral)    Resp 18    Ht '5\' 1"'  (1.549 m)    Wt 56.2 kg    SpO2 100%    BMI 23.43 kg/m   Physical Exam Vitals and nursing note reviewed.  Constitutional:      General: She is not in acute distress.    Appearance: Normal appearance. She is well-developed.  HENT:     Head: Normocephalic and atraumatic.  Eyes:     Conjunctiva/sclera: Conjunctivae normal.  Cardiovascular:     Rate and Rhythm: Normal rate and regular rhythm.     Heart sounds: No murmur heard. Pulmonary:     Effort: Pulmonary effort is normal. No respiratory distress.      Breath sounds: Normal breath sounds.  Abdominal:     Palpations: Abdomen is soft.     Tenderness: There is no abdominal tenderness.  Musculoskeletal:        General: Tenderness and signs of injury present. No swelling. Normal range of motion.     Cervical back: Neck supple.     Comments: She has a 4 cm skin tear to right elbow.  She has a deeper skin tear across her right knee approximately 10 cm with some fat exposed.  Extensor mechanism intact.  Skin:    General: Skin is warm and dry.     Capillary Refill: Capillary refill takes less than 2 seconds.  Neurological:     General: No focal deficit present.     Mental Status: She is alert.  Psychiatric:        Mood and Affect: Mood normal.    ED Results / Procedures / Treatments   Labs (all labs ordered are listed, but only abnormal results are displayed) Labs Reviewed - No data to display  EKG None  Radiology DG Knee Complete 4 Views Right  Result Date: 03/11/2021 CLINICAL DATA:  Trauma, fall EXAM: RIGHT KNEE - COMPLETE 4+ VIEW COMPARISON:  12/29/2017 FINDINGS: No recent fracture or dislocation is seen. There is no significant effusion in the suprapatellar bursa. There is calcification in the meniscal cartilages. Small bony spurs seen in the patella. Osteopenia is seen in bony structures. Fairly extensive arterial calcifications are seen in the soft tissues. IMPRESSION: No fracture or dislocation is seen in the right knee. There is calcification in the meniscal cartilages suggesting degenerative arthritis. Electronically Signed   By: Elmer Picker M.D.   On: 03/11/2021 13:45    Procedures .Marland KitchenLaceration Repair  Date/Time: 03/11/2021 2:25 PM  Performed by: Hayden Rasmussen, MD Authorized by: Hayden Rasmussen, MD   Consent:    Consent obtained:  Verbal   Consent given by:  Patient   Risks discussed:  Infection, pain, poor cosmetic result, poor wound healing and retained foreign body   Alternatives discussed:  No  treatment and delayed treatment Universal protocol:    Procedure explained and questions answered to patient or proxy's satisfaction: yes     Patient identity confirmed:  Verbally with patient Anesthesia:    Anesthesia method:  Local infiltration   Local anesthetic:  Lidocaine 2% WITH epi Laceration details:    Location:  Leg   Leg location:  R knee   Length (cm):  10 Pre-procedure details:    Preparation:  Patient was prepped and draped in usual sterile fashion and imaging obtained to evaluate for foreign bodies Treatment:    Area cleansed with:  Saline   Amount of cleaning:  Standard   Irrigation solution:  Sterile saline Skin repair:    Repair method:  Sutures   Suture size:  3-0   Suture material:  Nylon   Suture technique:  Simple interrupted   Number of sutures:  8 Approximation:    Approximation:  Close Repair type:    Repair type:  Simple Post-procedure details:    Dressing:  Non-adherent dressing and sterile dressing   Procedure completion:  Tolerated well, no immediate complications .Marland KitchenLaceration Repair  Date/Time: 03/11/2021 2:26 PM Performed by: Hayden Rasmussen, MD Authorized by: Hayden Rasmussen, MD   Consent:    Consent obtained:  Verbal   Consent given by:  Patient   Risks discussed:  Infection, pain, poor cosmetic result, poor wound healing and retained foreign body   Alternatives discussed:  No treatment and delayed treatment Universal protocol:    Procedure explained and questions answered to patient or proxy's satisfaction: yes   Anesthesia:    Anesthesia method:  None Laceration details:    Location:  Shoulder/arm   Shoulder/arm location:  R elbow   Length (cm):  6 Treatment:    Area cleansed with:  Saline   Amount of cleaning:  Standard   Irrigation solution:  Sterile saline Skin repair:    Repair method:  Steri-Strips Approximation:    Approximation:  Close Repair type:    Repair type:  Simple Post-procedure details:    Dressing:   Non-adherent dressing and sterile dressing   Procedure completion:  Tolerated well, no immediate complications   Medications Ordered in ED Medications  lidocaine-EPINEPHrine (XYLOCAINE W/EPI) 2 %-1:200000 (PF) injection 20 mL (20 mLs Infiltration Given by Other 03/11/21 1500)  Tdap (BOOSTRIX) injection 0.5 mL (0.5 mLs Intramuscular Given 03/11/21 1318)    ED Course  I have reviewed the triage vital signs and the nursing notes.  Pertinent labs & imaging results that were available during my care of the patient were reviewed by me and considered in my medical decision making (see chart for details).    MDM Rules/Calculators/A&P                         This patient complains of right knee laceration right elbow laceration status post fall; this involves an extensive number of treatment Options and is a complaint that carries with it a high risk of complications and Morbidity. The differential includes fracture, contusion, laceration, ligamentous injury, foreign body, infection I ordered medication IM tetanus, subcu lidocaine with epi I ordered imaging studies which included right  knee x-ray and I independently    visualized and interpreted imaging which showed no acute fracture Previous records obtained and reviewed in epic no recent admissions  After the interventions stated above, I reevaluated the patient and found patient be symptomatically improved.  She is ambulatory in the department.  Concerned that her lacerations are through very thin skin.  Likely will disrupt sutures but needs to close area.  Sutures placed on right knee, Steri-Strips for right elbow.  Will cover with antibiotics.  Recommended close follow-up with PCP and return instructions discussed.     Final Clinical Impression(s) / ED Diagnoses Final diagnoses:  Laceration of right knee, initial encounter  Laceration of right elbow, initial encounter  Fall in home, initial encounter    Rx / DC Orders ED Discharge  Orders          Ordered    doxycycline (VIBRAMYCIN) 100 MG capsule  2 times daily        03/11/21 1453             Hayden Rasmussen, MD 03/11/21 1906

## 2021-03-11 NOTE — ED Notes (Signed)
Wounds dressed with non-adherent dressing and wrapped in gauze. Pt verbalized understanding of wound care and keeping dressings clean and dry.

## 2021-03-16 NOTE — Progress Notes (Signed)
[place] CONSULT NOTE  Patient Care Team: Lawerance Cruel, MD as PCP - General (Family Medicine) Lawerance Cruel, MD as Attending Physician (Family Medicine) Eppie Gibson, MD as Consulting Physician (Radiation Oncology) Malmfelt, Stephani Police, RN as Oncology Nurse Navigator Jerline Pain, MD as Consulting Physician (Cardiology)  CHIEF COMPLAINTS/PURPOSE OF CONSULTATION:  Normocytic normochromic anemia follow-up  ASSESSMENT & PLAN:   This is a very pleasant 80 year old female patient with past medical history significant for diabetes, hypertension, breast cancer, malignant neoplasm of supraglottis, 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 She had no evidence of myeloma, hemolysis, nutritional deficiency except for a ferritin of 21 hence have recommended oral iron supplementation once a day.  Since her last visit, she has been continuing to oral iron twice a day and has been tolerating it well. Her most recent labs show hemoglobin at 10.3 g/dL and ferritin of 46.  Her ferritin has improved since last visit.  At this time I recommended that she continue oral iron at least once a day and return to clinic in 4 months.  She will follow-up with Dr. Isidore Moos for CT imaging review which is due in January.  She expressed understanding of the recommendations.  PLAN Continue iron once a day and return to clinic in 4 months Thank you for consulting [place] in the care of this patient.  Please do not hesitate to contact us with any additional questions or concerns.  HISTORY OF PRESENTING ILLNESS:   Anita Pollard 80 y.o. female is here because of anemia.  This is a very pleasant 80 year old female patient with past medical history significant for right-sided breast cancer, cancer of the supraglottis status postradiation, type 2 diabetes, hypertension, kidney transplant referred to hematology for evaluation of  slowly progressive anemia.  Since last visit, she had a fall. She tripped on electric cord She  is on abx for skin infection of elbow. She has been taking oral iron twice a day.  She denies any side effects with iron.  She completed radiation in October 2022 and has a CT follow-up in Jan 2023. Rest of the pertinent 10 point ROS reviewed and negative.  REVIEW OF SYSTEMS:   Constitutional: Denies fevers, chills or abnormal night sweats Eyes: Denies blurriness of vision, double vision or watery eyes Ears, nose, mouth, throat, and face: Denies mucositis or sore throat Respiratory: Denies cough, dyspnea or wheezes Cardiovascular: Denies palpitation, chest discomfort or lower extremity swelling Gastrointestinal:  Denies nausea, heartburn or change in bowel habits Skin: Denies abnormal skin rashes Lymphatics: Denies new lymphadenopathy or easy bruising Neurological:Denies numbness, tingling or new weaknesses Behavioral/Psych: Mood is stable, no new changes  All other systems were reviewed with the patient and are negative.  MEDICAL HISTORY:  Past Medical History:  Diagnosis Date   Alcohol abuse    Anemia    Arthritis    Breast cancer ([place]) 2010   Right   Chronic kidney disease    Colon polyp    Depression    Diabetes mellitus    GERD (gastroesophageal reflux disease)    Heart murmur    History of kidney stones 1976   HTN (hypertension)    Hypothyroidism    Osteopenia    Pancreatitis    Ulcerative esophagitis     SURGICAL HISTORY: Past Surgical History:  Procedure Laterality Date   ABDOMINAL HYSTERECTOMY     ABDOMINOPLASTY     APPENDECTOMY  BREAST BIOPSY Right 08/03/2008   Stereo Bx, Malignant   BREAST LUMPECTOMY Right 08/26/2008   CATARACT EXTRACTION W/ INTRAOCULAR LENS  IMPLANT, BILATERAL     CO2 LASER APPLICATION N/A 25/36/6440   Procedure: CO2 LASER APPLICATION;  Surgeon: Melida Quitter, MD;  Location: [facility];  Service: ENT;  Laterality: N/A;   COSMETIC SURGERY      ductal carcinoma     right breast    EYE SURGERY     HIP ARTHROPLASTY Right    INSERTION OF DIALYSIS CATHETER Right 09/25/2012   Procedure: INSERTION OF DIALYSIS CATHETER;  Surgeon: Mal Misty, MD;  Location[facility];  Service: Vascular;  Laterality: Right;  Righ Internal Jugular Placement   IR GASTROSTOMY TUBE MOD SED  02/10/2020   IR GASTROSTOMY TUBE REMOVAL  05/25/2020   JOINT REPLACEMENT     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   MICROLARYNGOSCOPY N/A 10/20/2019   Procedure: Suspended Microlaryngoscopy with Biopsy;  Surgeon: Melida Quitter, MD;  Location: [facility];  Service: ENT;  Laterality: N/A;   MICROLARYNGOSCOPY N/A 01/09/2020   Procedure: MICROLARYNGOSCOPY WITH BIOPSY;  Surgeon: Melida Quitter, MD;  Location: [facility];  Service: ENT;  Laterality: N/A;   RIGID ESOPHAGOSCOPY N/A 10/20/2019   Procedure: RIGID ESOPHAGOSCOPY;  Surgeon: Melida Quitter, MD;  Location: [place];  Service: ENT;  Laterality: N/A;   THYROIDECTOMY     TUMMY TUCK      SOCIAL HISTORY: Social History   Socioeconomic History   Marital status: Widowed    Spouse name: Not on file   Number of children: Not on file   Years of education: Not on file   Highest education level: Not on file  Occupational History   Not on file  Tobacco Use   Smoking status: Former    Types: Cigarettes    Quit date: 09/25/1983    Years since quitting: 37.4   Smokeless tobacco: Never  Vaping Use   Vaping Use: Never used  Substance and Sexual Activity   Alcohol use: Yes    Comment: socially history of heavy drinking with intervention x 2-at least 2 occasions since april '14   Drug use: No   Sexual activity: Never  Other Topics Concern   Not on file  Social History Narrative   Not on file   Social Determinants of Health   Financial Resource Strain: Not on file  Food Insecurity: Not on file  Transportation Needs: Not on file  Physical Activity: Not on file  Stress: Not on file  Social Connections: Not on file  Intimate Partner  Violence: Not on file    FAMILY HISTORY: Family History  Problem Relation Age of Onset   Thyroid cancer Daughter 41   Diabetes Father    Hypertension Father    Diabetes Brother    Hypertension Mother    Hypertension Sister    Colon polyps Brother    Atrial fibrillation Brother    Dementia Sister    Multiple myeloma Sister     ALLERGIES:  is allergic to penicillins, banana, detrol [tolterodine], quetiapine, risperidone and related, and lisinopril.  MEDICATIONS:  Current Outpatient Medications  Medication Sig Dispense Refill   amLODipine (NORVASC) 10 MG tablet Take 10 mg by mouth at bedtime.     Cholecalciferol (VITAMIN D3) 5000 UNITS TABS Take 5,000 Units by mouth 3 (three) times daily after meals.      doxycycline (VIBRAMYCIN) 100 MG capsule Take 1 capsule (100 mg total) by mouth 2 (two) times daily. 14 capsule 0  ferrous sulfate 324 MG TBEC Take 324 mg by mouth.     furosemide (LASIX) 40 MG tablet Take 1 tablet (40 mg total) by mouth daily. 90 tablet 3   glipiZIDE (GLUCOTROL XL) 2.5 MG 24 hr tablet Take 5 mg by mouth 2 (two) times daily.      insulin glargine, 1 Unit Dial, (TOUJEO SOLOSTAR) 300 UNIT/ML Solostar Pen 5 Units.     Lancets (FREESTYLE) lancets by Does not apply route.     levothyroxine (SYNTHROID) 200 MCG tablet Take 200 mcg by mouth daily before breakfast.      losartan (COZAAR) 25 MG tablet Take 25 mg by mouth at bedtime.      Melatonin 5 MG CAPS Take 5 mg by mouth See admin instructions. Take 5 mg 2 hours before bedtime     omeprazole (PRILOSEC) 20 MG capsule Take 20 mg by mouth daily.     Pancrelipase, Lip-Prot-Amyl, (CREON) 3000-9500 units CPEP Take 1 capsule by mouth 3 (three) times daily with meals.      Polyethyl Glycol-Propyl Glycol (SYSTANE OP) Place 1 drop into both eyes every evening.     pramipexole (MIRAPEX) 0.25 MG tablet Take 0.25-0.5 mg by mouth See admin instructions. Take 0.5 mg 2 hours prior to bedtime then 0.25 mg at bedtime     predniSONE  (DELTASONE) 10 MG tablet Take 10 mg by mouth daily with breakfast.      raloxifene (EVISTA) 60 MG tablet Take 60 mg by mouth daily.     rosuvastatin (CRESTOR) 5 MG tablet Take 1 tablet (5 mg total) by mouth 2 (two) times a week. Take on Mondays and Thursdays. 30 tablet 3   tacrolimus (PROGRAF) 0.5 MG capsule Take 0.5 mg by mouth every 12 (twelve) hours.      tacrolimus (PROGRAF) 1 MG capsule Take 1 mg by mouth every 12 (twelve) hours.      temazepam (RESTORIL) 15 MG capsule Take 15-30 mg by mouth at bedtime.      vitamin B-12 (CYANOCOBALAMIN) 500 MCG tablet Take 500 mcg by mouth daily.     No current facility-administered medications for this visit.   PHYSICAL EXAMINATION:  ECOG PERFORMANCE STATUS: 0 - Asymptomatic  There were no vitals filed for this visit.   Vital signs and physical examination not done, patient here for telephone visit  LABORATORY DATA:  I have reviewed the data as listed Lab Results  Component Value Date   WBC 7.5 09/28/2020   HGB 10.0 (L) 09/28/2020   HCT 30.4 (L) 09/28/2020   HCT 30.3 (L) 09/28/2020   MCV 94.4 09/28/2020   PLT 178 09/28/2020     Chemistry      Component Value Date/Time   NA 139 09/28/2020 1432   NA 137 03/22/2020 1439   NA 140 08/28/2013 0955   K 4.0 09/28/2020 1432   K 3.0 (LL) 08/28/2013 0955   CL 106 09/28/2020 1432   CL 111 (H) 09/03/2012 1249   CO2 22 09/28/2020 1432   CO2 16 (L) 08/28/2013 0955   BUN 43 (H) 09/28/2020 1432   BUN 42 (H) 03/22/2020 1439   BUN 75.7 (H) 08/28/2013 0955   CREATININE 1.67 (H) 09/28/2020 1432   CREATININE 5.9 (HH) 08/28/2013 0955      Component Value Date/Time   CALCIUM 9.0 09/28/2020 1432   CALCIUM 7.5 (L) 08/28/2013 0955   ALKPHOS 111 09/28/2020 1432   ALKPHOS 62 08/28/2013 0955   AST 15 09/28/2020 1432   AST 15 08/28/2013 0955  ALT 15 09/28/2020 1432   ALT 17 08/28/2013 0955   BILITOT 0.3 09/28/2020 1432   BILITOT 0.38 08/28/2013 0955     Reviewed labs from [facility] done on December  14.  Her white blood cell count of 5.7, hemoglobin is 10.3, platelet count of 210,000, absolute lymphocytes were low and absolute eosinophils were low, otherwise iron panel showed a ferritin of 46 and total iron binding capacity of 326, iron saturation of 25%.  Overall compared to the last labs, her hemoglobin is stable  RADIOGRAPHIC STUDIES: I have personally reviewed the radiological images as listed and agreed with the findings in the report. DG Knee Complete 4 Views Right  Result Date: 03/11/2021 CLINICAL DATA:  Trauma, fall EXAM: RIGHT KNEE - COMPLETE 4+ VIEW COMPARISON:  12/29/2017 FINDINGS: No recent fracture or dislocation is seen. There is no significant effusion in the suprapatellar bursa. There is calcification in the meniscal cartilages. Small bony spurs seen in the patella. Osteopenia is seen in bony structures. Fairly extensive arterial calcifications are seen in the soft tissues. IMPRESSION: No fracture or dislocation is seen in the right knee. There is calcification in the meniscal cartilages suggesting degenerative arthritis. Electronically Signed   By: Elmer Picker M.D.   On: 03/11/2021 13:45    I connected with  Anita Pollard on 03/17/21 by a telephone application and verified that I am speaking with the correct person using two identifiers.   I discussed the limitations of evaluation and management by telemedicine. The patient expressed understanding and agreed to proceed.  I spent 15 minutes in the care of this patient including history, review of records, discussion of labs and follow-up recommendations.    Benay Pike, MD 03/16/2021 8:37 PM

## 2021-03-17 ENCOUNTER — Inpatient Hospital Stay: Payer: Medicare Other | Attending: Hematology and Oncology | Admitting: Hematology and Oncology

## 2021-03-17 ENCOUNTER — Encounter: Payer: Self-pay | Admitting: Hematology and Oncology

## 2021-03-17 DIAGNOSIS — D649 Anemia, unspecified: Secondary | ICD-10-CM

## 2021-03-22 IMAGING — RF DG SWALLOWING FUNCTION
8 series · 24 of 24 positions shown · non-contrast
Comparison: PET-CT 01/29/2020.  Neck CT 10/08/2019.

CLINICAL DATA: Dysphagia, unspecified type. Cough. Additional
history provided: Patient reports history of head/neck cancer having
completed 9 of 35 radiation treatments, dysphagia.

EXAM:
MODIFIED BARIUM SWALLOW
TECHNIQUE: Different consistencies of barium were administered orally to the
patient by the Speech Pathologist. Imaging of the pharynx was
performed in the lateral projection. The radiologist was present in
the fluoroscopy room for this study, providing personal supervision.
FLUOROSCOPY TIME:  Fluoroscopy Time:  2 minutes, 6 seconds.
Radiation Exposure Index (if provided by the fluoroscopic device):
5.4 mGy
Number of Acquired Spot Images: 0

[Series 1: cp_standard · 0.34mm/px · 3 of 44 frames shown (1 of 8)]
[frame 7/44]
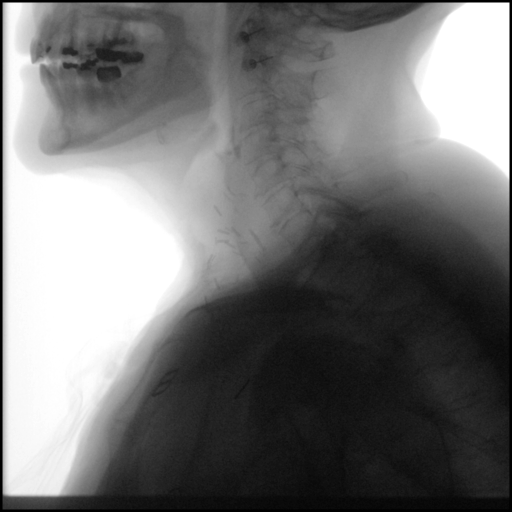
[frame 22/44]
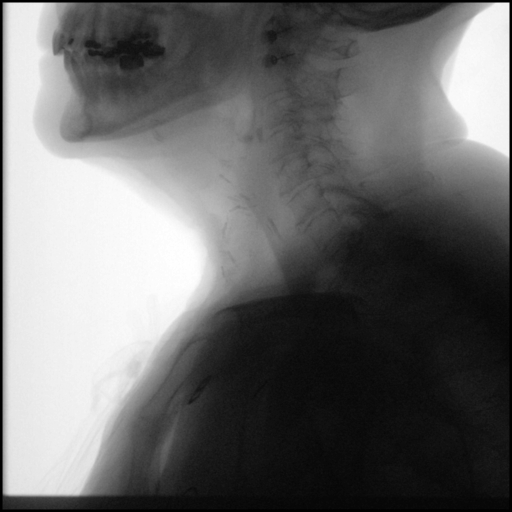
[frame 38/44]
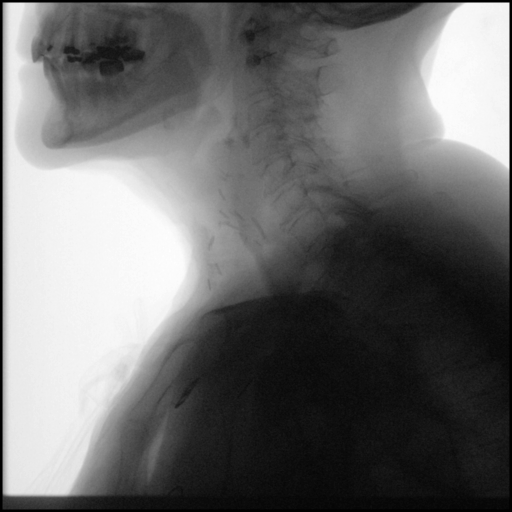

[Series 3: cp_standard · 0.34mm/px · 3 of 174 frames shown (2 of 8)]
[frame 9/174]
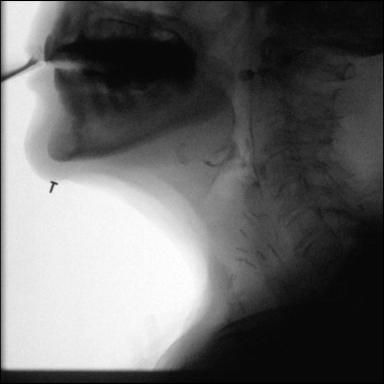
[frame 27/174]
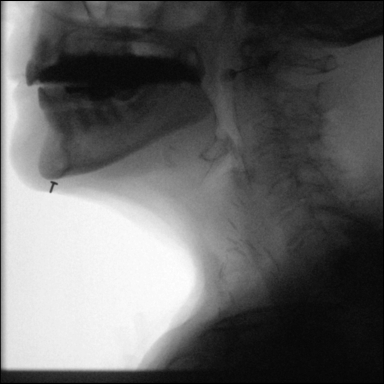
[frame 148/174]
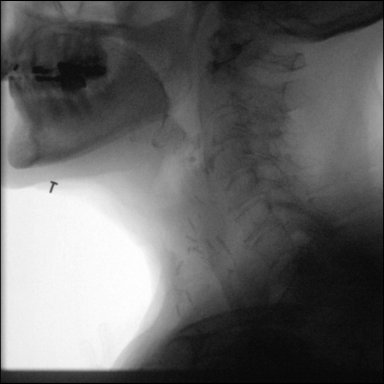

[Series 5: cp_standard · 0.34mm/px · 3 of 110 frames shown (3 of 8)]
[frame 17/110]
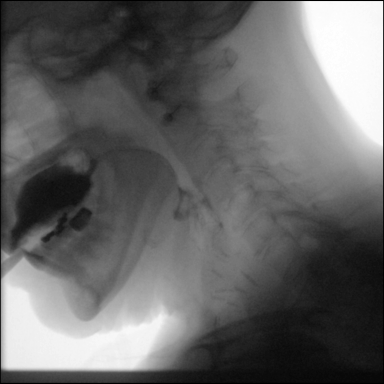
[frame 56/110]
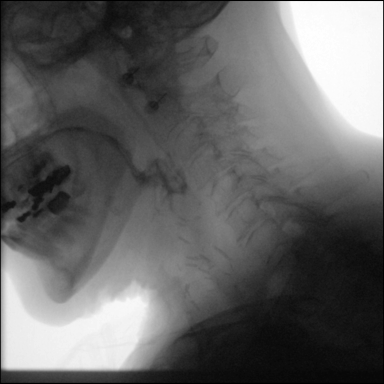
[frame 94/110]
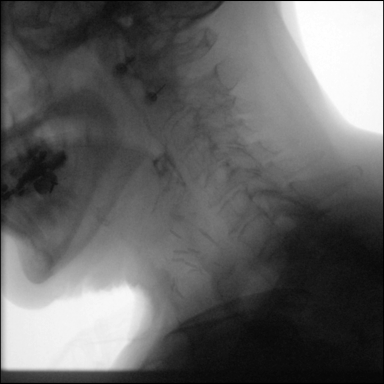

[Series 6: cp_standard · 0.34mm/px · 3 of 31 frames shown (4 of 8)]
[frame 5/31]
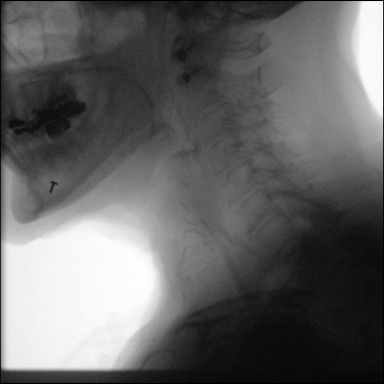
[frame 16/31]
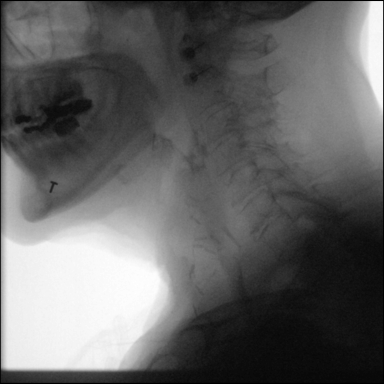
[frame 27/31]
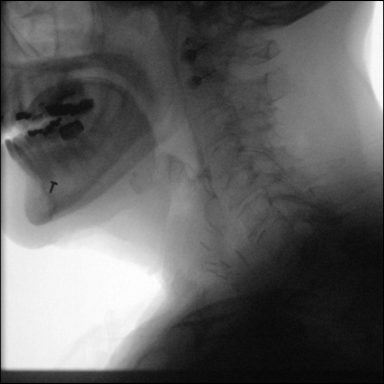

[Series 7: cp_standard · 0.34mm/px · 3 of 85 frames shown (5 of 8)]
[frame 13/85]
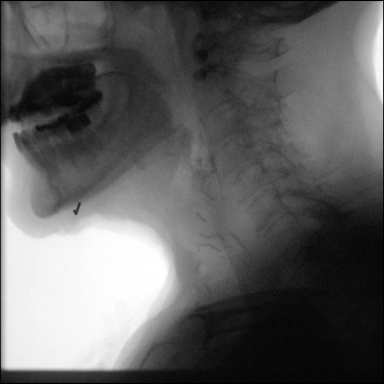
[frame 43/85]
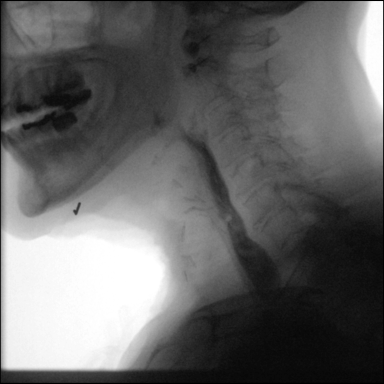
[frame 73/85]
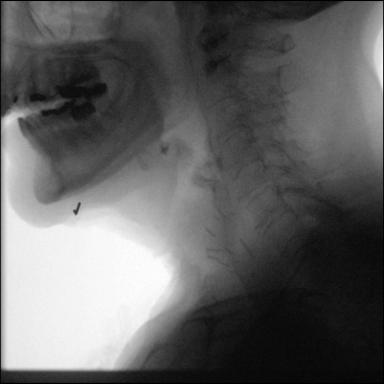

[Series 8: cp_standard · 0.34mm/px · 4 of 170 frames shown (6 of 8)]
[frame 21/170]
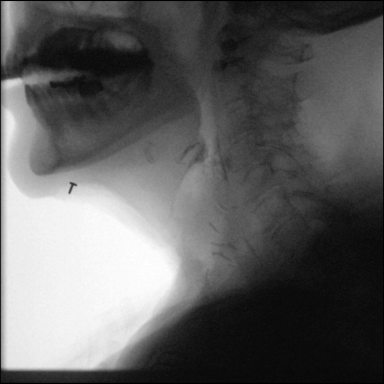
[frame 26/170]
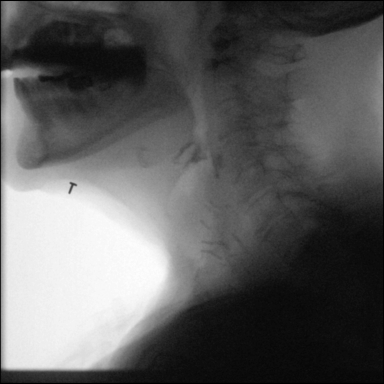
[frame 86/170]
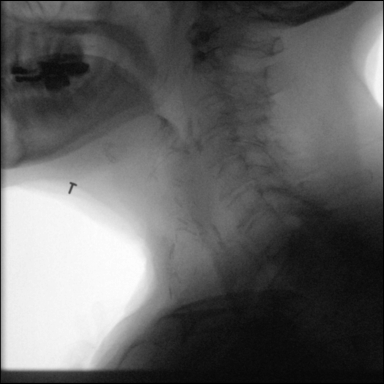
[frame 145/170]
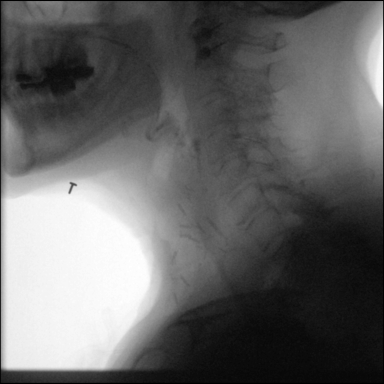

[Series 11: cp_standard · 0.34mm/px · 3 of 131 frames shown (7 of 8)]
[frame 20/131]
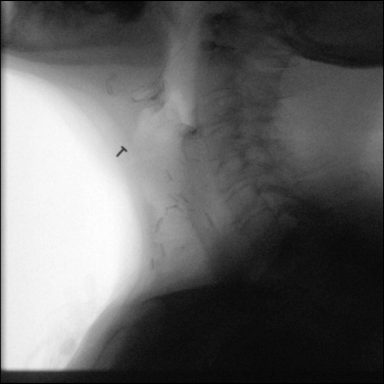
[frame 66/131]
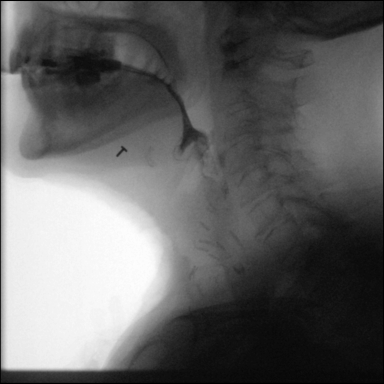
[frame 112/131]
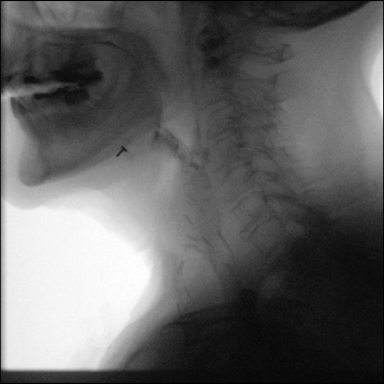

[Series 12: cp_standard · 0.34mm/px · 2 of 49 frames shown (8 of 8)]
[frame 25/49]
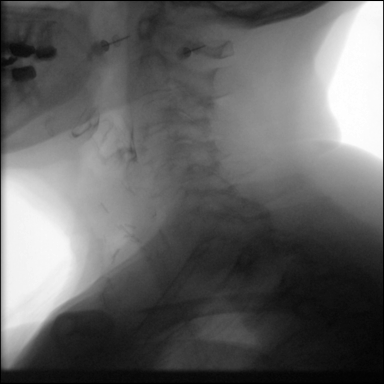
[frame 42/49]
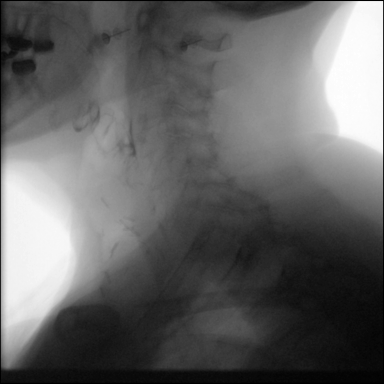

[24 of 24 positions shown; findings below may reference images not displayed]

FINDINGS: Different consistencies of barium were administered orally to the
patient by the Speech Pathologist with fluoroscopic imaging of the
pharynx, as well as limited imaging of the esophagus, from a lateral
projection. The radiologist was present in the fluoroscopy room for
this study, providing personal supervision. Laryngeal penetration
was observed with some consistencies. However, there was no evidence
of frank aspiration. Please refer to the Speech Pathologist's report
for full details.
IMPRESSION: Modified barium swallow as described.

Laryngeal penetration was observed with some consistencies. However,
there was no evidence of frank aspiration.

Please refer to the Speech Pathologists report for complete details
and recommendations.

## 2021-04-07 ENCOUNTER — Telehealth: Payer: Self-pay | Admitting: *Deleted

## 2021-04-07 NOTE — Telephone Encounter (Signed)
This RN returned call per pt's VM left 04/06/2021- obtained pt's VM- message left requesting her to call again.

## 2021-04-18 ENCOUNTER — Ambulatory Visit
Admission: RE | Admit: 2021-04-18 | Discharge: 2021-04-18 | Disposition: A | Discharge status: Home / Self Care | Payer: Medicare Other | Source: Ambulatory Visit | Attending: Radiation Oncology | Admitting: Radiation Oncology

## 2021-04-18 ENCOUNTER — Other Ambulatory Visit: Payer: Self-pay

## 2021-04-18 DIAGNOSIS — R911 Solitary pulmonary nodule: Secondary | ICD-10-CM

## 2021-04-20 ENCOUNTER — Telehealth: Payer: Self-pay

## 2021-04-20 ENCOUNTER — Ambulatory Visit
Admission: RE | Admit: 2021-04-20 | Discharge: 2021-04-20 | Disposition: A | Discharge status: Home / Self Care | Payer: Medicare Other | Source: Ambulatory Visit | Attending: Radiation Oncology | Admitting: Radiation Oncology

## 2021-04-20 DIAGNOSIS — C3432 Malignant neoplasm of lower lobe, left bronchus or lung: Secondary | ICD-10-CM | POA: Insufficient documentation

## 2021-04-20 DIAGNOSIS — C321 Malignant neoplasm of supraglottis: Secondary | ICD-10-CM

## 2021-04-20 NOTE — Progress Notes (Signed)
Radiation Oncology         [phone] ________________________________  Name: Anita Pollard MRN: [id number]  Date: 04/20/2021  DOB: Aug 14, 1940  Follow-Up Visit Note by telephone.  The patient opted for telemedicine to maximize safety during the pandemic.  MyChart video was not obtainable.   CC: Lawerance Cruel, MD  Melida Quitter, MD  Diagnosis and Prior Radiotherapy:       ICD-10-CM   1. Malignant neoplasm of supraglottis ([place])  C32.1     2. 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)  C34.32        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([place]) Staging form: Larynx - Supraglottis, AJCC 8th Edition - Clinical stage from 01/20/2020: Stage II (cT2, cN0, cM0) - Signed by Eppie Gibson, MD on 06/22/2020 Stage prefix: Initial diagnosis   Radiation Treatment Dates: 02/09/2020 through 03/31/2020 Site Technique Total Dose (Gy) Dose per Fx (Gy) Completed Fx Beam Energies  Larynx: HN_supragl IMRT 70/70 2 35/35 6X    Radiation Treatment Dates: 01/04/2021 through 01/14/2021 Site Technique Total Dose (Gy) Dose per Fx (Gy) Completed Fx Beam Energies  Lung, Left: Lung_Lt_lower IMRT 60/60 12 5/5 6XFFF   CHIEF COMPLAINT:  Here for follow-up and surveillance of lung/ throat cancer  Narrative:     She is doing well. Opted for telemedicine today due to concern re: virus infection.  She requests that we contact her nephrologist to allow certain meds to resume after radiation.          She saw ENT in Dec and states she underwent laryngoscopy.  ALLERGIES:  is allergic to penicillins, banana, detrol [tolterodine], quetiapine, risperidone and related, and lisinopril.  Meds: Current Outpatient Medications  Medication Sig Dispense Refill   amLODipine (NORVASC) 10 MG tablet Take 10 mg by mouth at bedtime.     Cholecalciferol (VITAMIN D3) 5000 UNITS TABS Take 5,000 Units by mouth 3 (three) times daily after meals.      doxycycline (VIBRAMYCIN) 100 MG capsule Take 1 capsule (100  mg total) by mouth 2 (two) times daily. 14 capsule 0   ferrous sulfate 324 MG TBEC Take 324 mg by mouth.     furosemide (LASIX) 40 MG tablet Take 1 tablet (40 mg total) by mouth daily. 90 tablet 3   glipiZIDE (GLUCOTROL XL) 2.5 MG 24 hr tablet Take 5 mg by mouth 2 (two) times daily.      insulin glargine, 1 Unit Dial, (TOUJEO SOLOSTAR) 300 UNIT/ML Solostar Pen 5 Units.     Lancets (FREESTYLE) lancets by Does not apply route.     levothyroxine (SYNTHROID) 200 MCG tablet Take 200 mcg by mouth daily before breakfast.      losartan (COZAAR) 25 MG tablet Take 25 mg by mouth at bedtime.      Melatonin 5 MG CAPS Take 5 mg by mouth See admin instructions. Take 5 mg 2 hours before bedtime     omeprazole (PRILOSEC) 20 MG capsule Take 20 mg by mouth daily.     Pancrelipase, Lip-Prot-Amyl, (CREON) 3000-9500 units CPEP Take 1 capsule by mouth 3 (three) times daily with meals.      Polyethyl Glycol-Propyl Glycol (SYSTANE OP) Place 1 drop into both eyes every evening.     pramipexole (MIRAPEX) 0.25 MG tablet Take 0.25-0.5 mg by mouth See admin instructions. Take 0.5 mg 2 hours prior to bedtime then 0.25 mg at bedtime     predniSONE (DELTASONE) 10 MG tablet Take 10 mg by mouth daily with  breakfast.      raloxifene (EVISTA) 60 MG tablet Take 60 mg by mouth daily.     rosuvastatin (CRESTOR) 5 MG tablet Take 1 tablet (5 mg total) by mouth 2 (two) times a week. Take on Mondays and Thursdays. 30 tablet 3   tacrolimus (PROGRAF) 0.5 MG capsule Take 0.5 mg by mouth every 12 (twelve) hours.      tacrolimus (PROGRAF) 1 MG capsule Take 1 mg by mouth every 12 (twelve) hours.      temazepam (RESTORIL) 15 MG capsule Take 15-30 mg by mouth at bedtime.      vitamin B-12 (CYANOCOBALAMIN) 500 MCG tablet Take 500 mcg by mouth daily.     No current facility-administered medications for this encounter.    Physical Findings: The patient is in no acute distress. Patient is alert and oriented. Wt Readings from Last 3 Encounters:   03/11/21 124 lb (56.2 kg)  02/09/21 124 lb (56.2 kg)  12/23/20 121 lb 6.4 oz (55.1 kg)    vitals were not taken for this visit. .  General: Alert and oriented, in no acute distress     Lab Findings: Lab Results  Component Value Date   WBC 7.5 09/28/2020   HGB 10.0 (L) 09/28/2020   HCT 30.4 (L) 09/28/2020   HCT 30.3 (L) 09/28/2020   MCV 94.4 09/28/2020   PLT 178 09/28/2020    Lab Results  Component Value Date   TSH 0.402 01/30/2020    Radiographic Findings: CT Chest Wo Contrast  Result Date: 04/19/2021 CLINICAL DATA:  Restaging non-small cell lung cancer. History of head and neck cancer also. EXAM: CT CHEST WITHOUT CONTRAST TECHNIQUE: Multidetector CT imaging of the chest was performed following the standard protocol without IV contrast. RADIATION DOSE REDUCTION: This exam was performed according to the departmental dose-optimization program which includes automated exposure control, adjustment of the mA and/or kV according to patient size and/or use of iterative reconstruction technique. COMPARISON:  PET-CT 12/14/2020 FINDINGS: Cardiovascular: The heart is normal in size. No pericardial effusion. Stable advanced atherosclerotic calcification involving the thoracic aorta but no aneurysm. Stable 2 vessel coronary artery calcifications. Mediastinum/Nodes: No mediastinal or hilar mass or lymphadenopathy. Small scattered lymph nodes are stable. Lungs/Pleura: Stable appearing difficult to measure ground-glass nodule in the right upper lobe. This measures a maximum of 13 mm. The left lower lobe pulmonary nodule measures 8 mm on image number 65/5. This appears stable. New 3 mm subpleural nodule adjacent to the major fissure in the left upper lobe on image number 26/5. Stable 3 mm subpleural nodule in the right middle lobe on image number 75/3. Stable calcified granuloma peripherally in the right upper lobe on image number 62/5. Stable underlying emphysematous changes and areas of pulmonary  scarring. Upper Abdomen: No significant upper abdominal findings. Stable left adrenal gland adenoma. Stable advanced vascular calcifications. Musculoskeletal: No significant bony findings. IMPRESSION: 1. Stable 13 mm ground-glass nodule in the right upper lobe. 2. Stable 8 mm left lower lobe pulmonary nodule. 3. New 3 mm subpleural nodule in the left upper lobe adjacent to the major fissure. 4. No mediastinal or hilar mass or adenopathy. 5. Stable emphysematous changes and pulmonary scarring. 6. Stable left adrenal gland adenoma. 7. Stable advanced atherosclerotic calcification involving the thoracic and abdominal aorta and branch vessels including the coronary arteries. Aortic Atherosclerosis (ICD10-I70.0) and Emphysema (ICD10-J43.9). Electronically Signed   By: Marijo Sanes M.D.   On: 04/19/2021 13:15     Impression/Plan:    1) Head and Neck  Cancer / lung cancer Status: in remission per chest CT. She understands that ideally she should come in person for next appt for physical exam. She is s/p RT to larynx and neck and SBRT for suspicious lung nodule.  2) Nutritional Status: no concerns stated  3) Risk Factors: The patient has been educated about risk factors including alcohol and tobacco abuse; they understand that avoidance of alcohol and tobacco is important to prevent recurrences as well as other cancers  4) Swallowing:   Continue swallowing exercises per SLP  5) Dental: Encouraged to continue regular followup with dentistry, and dental hygiene including fluoride rinses.   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  She will continue supplementation and labs per her doctor's instructions. Lab Results  Component Value Date   TSH 0.402 01/30/2020   7)   Dr Reather Laurence is this patient's nephrologist at [facility] will call his nurse to let them know that we are fine w/ her resuming any meds for her kidneys - the radiation  is not affecting her systemically   8)  Followup in 9mo with CT chest no contrast - sooner prn.    This encounter was provided by telemedicine platform; patient desired telemedicine during pandemic precautions.  Mychart video was not available and thus phone was used. The patient has given verbal consent for this type of encounter and has been advised to only accept a meeting of this type in a secure network environment. On date of service, in total, I spent 20 minutes on this encounter.   The attendants for this meeting include Eppie Gibson  and Charlann Lange During the encounter, Eppie Gibson was located at [facility] Radiation Oncology Department.  AALIJAH MIMS was located at home.    Eppie Gibson, MD

## 2021-04-20 NOTE — Telephone Encounter (Signed)
Called and spoke with Apolonio Schneiders at Dr. Frederik Pear office with [facility]. Asked that she let Dr. Jacolyn Reedy know per Dr. Isidore Moos: "patient can restart any of the kidney medications he had her hold since the radiation she received did not affect her systemically. If he has any questions, please leave my cell phone". Apolonio Schneiders confirmed message and that she would relay to Dr. Jacolyn Reedy.   Called patient with update. She verbalized understanding and appreciation of call, no other needs identified at this time

## 2021-04-22 ENCOUNTER — Other Ambulatory Visit: Payer: Self-pay

## 2021-04-22 DIAGNOSIS — R911 Solitary pulmonary nodule: Secondary | ICD-10-CM

## 2021-04-22 DIAGNOSIS — C321 Malignant neoplasm of supraglottis: Secondary | ICD-10-CM

## 2021-04-26 ENCOUNTER — Ambulatory Visit: Payer: Medicare Other | Admitting: Cardiology

## 2021-05-05 ENCOUNTER — Ambulatory Visit (INDEPENDENT_AMBULATORY_CARE_PROVIDER_SITE_OTHER): Payer: Medicare Other | Admitting: Podiatrist

## 2021-05-05 ENCOUNTER — Encounter: Payer: Self-pay | Admitting: Podiatrist

## 2021-05-05 ENCOUNTER — Ambulatory Visit (INDEPENDENT_AMBULATORY_CARE_PROVIDER_SITE_OTHER): Payer: Medicare Other

## 2021-05-05 ENCOUNTER — Other Ambulatory Visit: Payer: Self-pay

## 2021-05-05 DIAGNOSIS — S92415A Nondisplaced fracture of proximal phalanx of left great toe, initial encounter for closed fracture: Secondary | ICD-10-CM

## 2021-05-05 NOTE — Progress Notes (Signed)
Chief Complaint  Patient presents with   Diabetes    Diabetic foot exam  Left foot hallux toe      HPI: Patient is 81 y.o. female who presents today for swelling in the left foot especially her great toe.  She relates that back in December 16th 2022 she got her toe caught on a extension cord and she got up and fell and also hurt her arm.  She relates that her toe has been swollen ever since.  She is concerned it may be have come out of the joint.  She denies any significant pain in the toe but relates that she had other injuries which were more painful.  Allergies  Allergen Reactions   Penicillins Itching and Swelling    Swelling and flushing Has patient had a PCN reaction causing immediate rash, facial/tongue/throat swelling, SOB or lightheadedness with hypotension: yes Has patient had a PCN reaction causing severe rash involving mucus membranes or skin necrosis: no Has patient had a PCN reaction that required hospitalization: no Has patient had a PCN reaction occurring within the last 10 years: no If all of the above answers are "NO", then may proceed with Cephalosporin use.   Banana Nausea Only    Green bananas    Detrol [Tolterodine]     Leg cramps   Quetiapine Other (See Comments)    Leg cramps/restless leg   Risperidone And Related     Unknown reaction   Lisinopril Cough    Review of systems is negative except as noted in the HPI.  Denies nausea/ vomiting/ fevers/ chills or night sweats.   Denies difficulty breathing, denies calf pain or tenderness  Physical Exam  Patient is awake, alert, and oriented x 3.  In no acute distress.    Vascular status is intact with palpable pedal pulses DP and PT bilateral and capillary refill time less than 3 seconds bilateral.  No edema or erythema noted.   Neurological exam reveals epicritic and protective sensation grossly intact bilateral.   Dermatological exam reveals skin is supple and dry to bilateral feet.  No open lesions present.   Digital nails are well maintained.  Musculoskeletal exam: Left hallux is swollen in comparison with the right.  Slight hammertoe of the left second digit is also noted.  No significant tenderness to palpation is noted.  Decreased range of motion at the hallux is noted.  X-ray evaluation 3 views of the left foot are obtained.  Fracture at the head of the proximal phalanx of the hallux is noted.  It does appear to be in okay alignment.  Some compression of the fracture fragments is noted to have occurred.  Assessment:   ICD-10-CM   1. Closed nondisplaced fracture of proximal phalanx of left great toe, initial encounter  S92.415A DG Foot Complete Left       Plan: Discussed with the patient exam and x-ray findings.  Discussed that she does have a fracture of this toe and that this toe needs to be immobilized.  A surgical shoe was dispensed for her use.  I also discussed the benefit of a bone stimulator considering she has had this fracture for over a month and it does not appear to be healing.  I will find out about getting a bone stimulator for her to use to assist with healing.  Patient be seen back in 1 month for follow-up any concerns arise prior to that visit she will call.

## 2021-05-05 NOTE — Patient Instructions (Signed)
I will try to get you a bone stimulator-- stay tuned.. I'll let you know!   Toe Fracture A toe fracture is a break in one of the toe bones (phalanges). This may happen if you: Drop a heavy object on your toe. Stub your toe. Twist your toe. Exercise the same way too much. What are the signs or symptoms? The main symptoms are swelling and pain in the toe. You may also have: Bruising. Stiffness. Numbness. A change in the way the toe looks. Broken bones that poke through the skin. Blood under the toenail. How is this treated? Treatments may include: Taping the broken toe to a toe that is next to it (buddy taping). Wearing a shoe that has a wide, rigid sole to protect the toe and to limit its movement. Wearing a cast. Surgery. This may be needed if the: Pieces of broken bone are out of place. Bone pokes through the skin. Physical therapy. Follow these instructions at home: If you have a shoe: Wear the shoe as told by your doctor. Remove it only as told by your doctor. Loosen the shoe if your toes tingle, become numb, or turn cold and blue. Keep the shoe clean and dry. If you have a cast: Do not put pressure on any part of the cast until it is fully hardened. This may take a few hours. Do not stick anything inside the cast to scratch your skin. Check the skin around the cast every day. Tell your doctor about any concerns. You may put lotion on dry skin around the edges of the cast. Do not put lotion on the skin under the cast. Keep the cast clean and dry. Bathing Do not take baths, swim, or use a hot tub until your doctor says it is okay. Ask your doctor if you can take showers. If the shoe or cast is not waterproof: Do not let it get wet. Cover it with a watertight covering when you take a bath or a shower. Activity Do not use your foot to support your body weight until your doctor says it is okay. Use crutches as told by your doctor. Ask your doctor what activities are safe  for you during recovery. Avoid activities as told by your doctor. Do exercises as told by your doctor or therapist. Driving Do not drive or use heavy machinery while taking pain medicine. Do not drive while wearing a cast on a foot that you use for driving. Managing pain, stiffness, and swelling  Put ice on the injured area if told by your doctor: Put ice in a plastic bag. Place a towel between your skin and the bag. If you have a shoe, remove it as told by your doctor. If you have a cast, place a towel between your cast and the bag. Leave the ice on for 20 minutes, 2-3 times per day. Raise (elevate) the injured area above the level of your heart while you are sitting or lying down. General instructions If your toe was taped to a toe that is next to it, follow your doctor's instructions for changing the gauze and tape. Change it more often: If the gauze and tape get wet. If this happens, dry the space between the toes. If the gauze and tape are too tight and they cause your toe to become pale or to lose feeling (go numb). If your doctor did not give you a protective shoe, wear sturdy shoes that support your foot. Your shoes should not: Pinch your toes.  Fit tightly against your toes. Do not use any tobacco products, including cigarettes, chewing tobacco, or e-cigarettes. These can delay bone healing. If you need help quitting, ask your doctor. Take medicines only as told by your doctor. Keep all follow-up visits as told by your doctor. This is important. Contact a doctor if: Your pain medicine is not helping. You have a fever. You notice a bad smell coming from your cast. Get help right away if: You lose feeling (have numbness) in your toe or foot, and it is getting worse. Your toe or your foot tingles. Your toe or your foot gets cold or turns blue. You have redness or swelling in your toe or foot, and it is getting worse. You have very bad pain. Summary A toe fracture is a break  in one of the toe bones. Use ice and raise your foot. This will help lessen pain and swelling. Use crutches as told by your doctor. This information is not intended to replace advice given to you by your health care provider. Make sure you discuss any questions you have with your health care provider. Document Revised: 08/16/2020 Document Reviewed: 08/16/2020 Elsevier Patient Education  [place].

## 2021-05-16 ENCOUNTER — Telehealth: Payer: Self-pay | Admitting: *Deleted

## 2021-05-16 NOTE — Telephone Encounter (Signed)
Patient is calling for status on a bone stimulator for toe that was supposed to ordered. Please advise.

## 2021-05-17 NOTE — Telephone Encounter (Signed)
Please contact Sarah w/ [employer]

## 2021-05-17 NOTE — Telephone Encounter (Signed)
Called and they do not have an order on file for patient, but can fax a new order to them [phone] or email at [email or web address].

## 2021-05-17 NOTE — Telephone Encounter (Signed)
Please contact Judson Roch

## 2021-06-02 ENCOUNTER — Encounter: Payer: Self-pay | Admitting: Podiatry

## 2021-06-02 ENCOUNTER — Ambulatory Visit (INDEPENDENT_AMBULATORY_CARE_PROVIDER_SITE_OTHER): Payer: Medicare Other | Admitting: Podiatry

## 2021-06-02 ENCOUNTER — Other Ambulatory Visit: Payer: Self-pay

## 2021-06-02 ENCOUNTER — Ambulatory Visit (INDEPENDENT_AMBULATORY_CARE_PROVIDER_SITE_OTHER): Payer: Medicare Other

## 2021-06-02 DIAGNOSIS — S92415A Nondisplaced fracture of proximal phalanx of left great toe, initial encounter for closed fracture: Secondary | ICD-10-CM | POA: Diagnosis not present

## 2021-06-03 NOTE — Progress Notes (Signed)
Subjective:  ? ?Patient ID: Anita Pollard, female   DOB: 81 y.o.   MRN: [id number]  ? ?HPI ?Patient presents stating that her toe is getting better and she has been wearing the surgical shoe and having less pain ? ? ?ROS ? ? ?   ?Objective:  ?Physical Exam  ?Neurovascular status intact with fracture of the left hallux which is improving with diminished discomfort and pain only upon prolonged ambulation at this time with shoe on ? ?   ?Assessment:  ?Doing well with surgical shoe left with diminished discomfort in the left hallux ? ?   ?Plan:  ?H&P reviewed condition recommended anti-inflammatories and gradual reduction of the surgical shoe and returning to rigid bottom shoe.  I do not think she is getting need a bone stimulator for this condition ? ?X-rays indicate that the hallux is healing of the fractures of the proximal phalanx but it appears stable not involving the joint ?   ? ? ?

## 2021-06-27 ENCOUNTER — Encounter: Payer: Self-pay | Admitting: Podiatry

## 2021-06-27 ENCOUNTER — Ambulatory Visit (INDEPENDENT_AMBULATORY_CARE_PROVIDER_SITE_OTHER): Payer: Medicare Other

## 2021-06-27 ENCOUNTER — Ambulatory Visit (INDEPENDENT_AMBULATORY_CARE_PROVIDER_SITE_OTHER): Payer: Medicare Other | Admitting: Podiatry

## 2021-06-27 DIAGNOSIS — S92415A Nondisplaced fracture of proximal phalanx of left great toe, initial encounter for closed fracture: Secondary | ICD-10-CM

## 2021-06-27 DIAGNOSIS — M7752 Other enthesopathy of left foot: Secondary | ICD-10-CM | POA: Diagnosis not present

## 2021-06-27 MED ORDER — TRIAMCINOLONE ACETONIDE 10 MG/ML IJ SUSP
10.0000 mg | Freq: Once | INTRAMUSCULAR | Status: AC
  Administered 2021-06-27: 10 mg

## 2021-06-27 NOTE — Progress Notes (Signed)
Subjective:  ? ?Patient ID: Anita Pollard, female   DOB: 81 y.o.   MRN: [id number]  ? ?HPI ?Patient states she is still healing from the fracture of her big toe left but does have discomfort in the second joint left with inflammation fluid around the joint surface.  Patient states it is hard for her to walk and it feels like she is walking on a ball ? ? ?ROS ? ? ?   ?Objective:  ?Physical Exam  ?Neurovascular status intact mild edema still noted in the left big toe but continues to improve with inflammation fluid buildup of the second metatarsal phalangeal joint left ? ?   ?Assessment:  ?Fracture of the left hallux which appears to be still healing with inflammatory capsulitis second MPJ left ? ?   ?Plan:  ?H&P reviewed condition and for the fracture continue rigid bottom shoes and for the joint I did a sterile block of the forefoot 60 mg like Marcaine mixture aspirated the second MPJ getting out a small amount of clear fluid injected quarter cc dexamethasone Kenalog and advised on rigid bottom shoe.  Reappoint to recheck ? ?X-rays indicate there is a healing fracture of the head of the proximal phalanx hallux left and no indications fracture or dislocation of the second MPJ left ?   ? ? ?

## 2021-07-14 ENCOUNTER — Ambulatory Visit: Payer: Medicare Other | Admitting: Hematology and Oncology

## 2021-07-15 ENCOUNTER — Ambulatory Visit: Payer: Medicare Other | Admitting: Podiatry

## 2021-07-18 ENCOUNTER — Telehealth: Payer: Self-pay | Admitting: *Deleted

## 2021-07-18 ENCOUNTER — Ambulatory Visit
Admission: RE | Admit: 2021-07-18 | Discharge: 2021-07-18 | Disposition: A | Discharge status: Home / Self Care | Payer: Medicare Other | Source: Ambulatory Visit | Attending: Radiation Oncology | Admitting: Radiation Oncology

## 2021-07-18 DIAGNOSIS — C321 Malignant neoplasm of supraglottis: Secondary | ICD-10-CM

## 2021-07-18 DIAGNOSIS — R911 Solitary pulmonary nodule: Secondary | ICD-10-CM

## 2021-07-18 NOTE — Telephone Encounter (Signed)
Called patient to inform that Dr. Isidore Pollard would be willing to do a telephone fu on Friday April 28, due to the fact that Dr. Chryl Heck will be seeing her on Thursday, lvm for a return call ?

## 2021-07-19 ENCOUNTER — Other Ambulatory Visit: Payer: Self-pay | Admitting: Family Medicine

## 2021-07-19 DIAGNOSIS — I8002 Phlebitis and thrombophlebitis of superficial vessels of left lower extremity: Secondary | ICD-10-CM

## 2021-07-20 ENCOUNTER — Ambulatory Visit (INDEPENDENT_AMBULATORY_CARE_PROVIDER_SITE_OTHER): Payer: Medicare Other

## 2021-07-20 ENCOUNTER — Encounter: Payer: Self-pay | Admitting: Podiatry

## 2021-07-20 ENCOUNTER — Ambulatory Visit
Admission: RE | Admit: 2021-07-20 | Discharge: 2021-07-20 | Disposition: A | Discharge status: Home / Self Care | Payer: Medicare Other | Source: Ambulatory Visit | Attending: Family Medicine | Admitting: Family Medicine

## 2021-07-20 ENCOUNTER — Ambulatory Visit (INDEPENDENT_AMBULATORY_CARE_PROVIDER_SITE_OTHER): Payer: Medicare Other | Admitting: Podiatry

## 2021-07-20 DIAGNOSIS — M7752 Other enthesopathy of left foot: Secondary | ICD-10-CM

## 2021-07-20 DIAGNOSIS — I8002 Phlebitis and thrombophlebitis of superficial vessels of left lower extremity: Secondary | ICD-10-CM

## 2021-07-20 DIAGNOSIS — S92415A Nondisplaced fracture of proximal phalanx of left great toe, initial encounter for closed fracture: Secondary | ICD-10-CM

## 2021-07-20 MED ORDER — TRIAMCINOLONE ACETONIDE 10 MG/ML IJ SUSP
10.0000 mg | Freq: Once | INTRAMUSCULAR | Status: AC
  Administered 2021-07-20: 10 mg

## 2021-07-20 NOTE — Progress Notes (Signed)
Subjective:  ? ?Patient ID: Anita Pollard, female   DOB: 81 y.o.   MRN: [id number]  ? ?HPI ?Patient presents stating she is having a lot of pain on the left fifth toe and states she is not sure what may be causing it ? ? ?ROS ? ? ?   ?Objective:  ?Physical Exam  ?Neurovascular status intact with patient's left hallux healed well but a acute discomfort left fifth digit on the medial side not sure what might be causing it but hard to wear shoes ? ?   ?Assessment:  ?Probability for inflammatory capsulitis of the inner phalangeal joint digit 5 left with slight keratotic lesion exostotic lesion  ? ?   ?Plan:  ?H&P conditions reviewed and I did a sterile prep and injected the inner phalangeal joint left fifth digit medial side 2 mg Dexasone Kenalog 5 mg Xylocaine debrided lesions and applied cushioning to the toe.  May require exostectomy that I made her aware of today ? ?X-rays did indicate small bone spur and indicated the fracture left hallux has healed ?   ? ? ?

## 2021-07-21 ENCOUNTER — Encounter: Payer: Self-pay | Admitting: Hematology and Oncology

## 2021-07-21 ENCOUNTER — Inpatient Hospital Stay: Payer: Medicare Other | Attending: Hematology and Oncology | Admitting: Hematology and Oncology

## 2021-07-21 ENCOUNTER — Inpatient Hospital Stay: Payer: Medicare Other

## 2021-07-21 VITALS — BP 164/61 | HR 78 | Temp 98.5°F | Resp 16 | Ht 61.0 in | Wt 125.5 lb

## 2021-07-21 DIAGNOSIS — M8580 Other specified disorders of bone density and structure, unspecified site: Secondary | ICD-10-CM | POA: Diagnosis not present

## 2021-07-21 DIAGNOSIS — K8689 Other specified diseases of pancreas: Secondary | ICD-10-CM | POA: Insufficient documentation

## 2021-07-21 DIAGNOSIS — Z808 Family history of malignant neoplasm of other organs or systems: Secondary | ICD-10-CM | POA: Insufficient documentation

## 2021-07-21 DIAGNOSIS — Z923 Personal history of irradiation: Secondary | ICD-10-CM | POA: Diagnosis not present

## 2021-07-21 DIAGNOSIS — C321 Malignant neoplasm of supraglottis: Secondary | ICD-10-CM | POA: Diagnosis present

## 2021-07-21 DIAGNOSIS — Z8249 Family history of ischemic heart disease and other diseases of the circulatory system: Secondary | ICD-10-CM | POA: Insufficient documentation

## 2021-07-21 DIAGNOSIS — Z880 Allergy status to penicillin: Secondary | ICD-10-CM | POA: Insufficient documentation

## 2021-07-21 DIAGNOSIS — Z8719 Personal history of other diseases of the digestive system: Secondary | ICD-10-CM | POA: Diagnosis not present

## 2021-07-21 DIAGNOSIS — Z87442 Personal history of urinary calculi: Secondary | ICD-10-CM | POA: Insufficient documentation

## 2021-07-21 DIAGNOSIS — C3432 Malignant neoplasm of lower lobe, left bronchus or lung: Secondary | ICD-10-CM | POA: Diagnosis present

## 2021-07-21 DIAGNOSIS — D649 Anemia, unspecified: Secondary | ICD-10-CM | POA: Insufficient documentation

## 2021-07-21 DIAGNOSIS — I700 Atherosclerosis of aorta: Secondary | ICD-10-CM | POA: Insufficient documentation

## 2021-07-21 DIAGNOSIS — R682 Dry mouth, unspecified: Secondary | ICD-10-CM | POA: Insufficient documentation

## 2021-07-21 DIAGNOSIS — I10 Essential (primary) hypertension: Secondary | ICD-10-CM | POA: Insufficient documentation

## 2021-07-21 DIAGNOSIS — Z79899 Other long term (current) drug therapy: Secondary | ICD-10-CM | POA: Diagnosis not present

## 2021-07-21 DIAGNOSIS — Z9049 Acquired absence of other specified parts of digestive tract: Secondary | ICD-10-CM | POA: Insufficient documentation

## 2021-07-21 DIAGNOSIS — Z833 Family history of diabetes mellitus: Secondary | ICD-10-CM | POA: Insufficient documentation

## 2021-07-21 DIAGNOSIS — Z8371 Family history of colonic polyps: Secondary | ICD-10-CM | POA: Insufficient documentation

## 2021-07-21 DIAGNOSIS — J439 Emphysema, unspecified: Secondary | ICD-10-CM | POA: Diagnosis not present

## 2021-07-21 DIAGNOSIS — Z818 Family history of other mental and behavioral disorders: Secondary | ICD-10-CM | POA: Diagnosis not present

## 2021-07-21 DIAGNOSIS — Z940 Kidney transplant status: Secondary | ICD-10-CM | POA: Diagnosis not present

## 2021-07-21 DIAGNOSIS — D3502 Benign neoplasm of left adrenal gland: Secondary | ICD-10-CM | POA: Diagnosis not present

## 2021-07-21 DIAGNOSIS — I2510 Atherosclerotic heart disease of native coronary artery without angina pectoris: Secondary | ICD-10-CM | POA: Insufficient documentation

## 2021-07-21 DIAGNOSIS — Z888 Allergy status to other drugs, medicaments and biological substances status: Secondary | ICD-10-CM | POA: Insufficient documentation

## 2021-07-21 LAB — IRON AND IRON BINDING CAPACITY (CC-WL,HP ONLY)
Iron: 100 ug/dL (ref 28–170)
Saturation Ratios: 31 % (ref 10.4–31.8)
TIBC: 328 ug/dL (ref 250–450)
UIBC: 228 ug/dL (ref 148–442)

## 2021-07-21 LAB — CBC WITH DIFFERENTIAL/PLATELET
Abs Immature Granulocytes: 0.08 10*3/uL — ABNORMAL HIGH (ref 0.00–0.07)
Basophils Absolute: 0 10*3/uL (ref 0.0–0.1)
Basophils Relative: 1 %
Eosinophils Absolute: 0 10*3/uL (ref 0.0–0.5)
Eosinophils Relative: 1 %
HCT: 30.2 % — ABNORMAL LOW (ref 36.0–46.0)
Hemoglobin: 10 g/dL — ABNORMAL LOW (ref 12.0–15.0)
Immature Granulocytes: 1 %
Lymphocytes Relative: 11 %
Lymphs Abs: 0.7 10*3/uL (ref 0.7–4.0)
MCH: 32.3 pg (ref 26.0–34.0)
MCHC: 33.1 g/dL (ref 30.0–36.0)
MCV: 97.4 fL (ref 80.0–100.0)
Monocytes Absolute: 0.4 10*3/uL (ref 0.1–1.0)
Monocytes Relative: 6 %
Neutro Abs: 4.9 10*3/uL (ref 1.7–7.7)
Neutrophils Relative %: 80 %
Platelets: 187 10*3/uL (ref 150–400)
RBC: 3.1 MIL/uL — ABNORMAL LOW (ref 3.87–5.11)
RDW: 12.6 % (ref 11.5–15.5)
WBC: 6 10*3/uL (ref 4.0–10.5)
nRBC: 0 % (ref 0.0–0.2)

## 2021-07-21 LAB — TSH: TSH: 0.059 u[IU]/mL — ABNORMAL LOW (ref 0.350–4.500)

## 2021-07-21 LAB — FERRITIN: Ferritin: 37 ng/mL (ref 11–307)

## 2021-07-21 LAB — VITAMIN B12: Vitamin B-12: 770 pg/mL (ref 180–914)

## 2021-07-21 NOTE — Progress Notes (Signed)
[facility] ?CONSULT NOTE ? ?Patient Care Team: ?Lawerance Cruel, MD as PCP - General (Family Medicine) ?Lawerance Cruel, MD as Attending Physician (Family Medicine) ?Eppie Gibson, MD as Consulting Physician (Radiation Oncology) ?Malmfelt, Stephani Police, RN as Oncology Nurse Navigator ?Jerline Pain, MD as Consulting Physician (Cardiology) ? ?CHIEF COMPLAINTS/PURPOSE OF CONSULTATION:  ?Normocytic normochromic anemia follow-up ? ?ASSESSMENT & PLAN:  ? 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  ?She is here for follow-up.  Since her last visit, she denies any new complaints.  She had supraglottic cancer and was treated with radiation. ?No concerning review of systems.  No physical examination findings.  No cervical lymphadenopathy or notable oral mass. ?CBC from today shows stable anemia, hemoglobin of 10 which was exactly the same 9 months ago.  Iron parameters look better, ferritin is pending, B12 is normal. ?She continues on iron once a day at this time.  If her ferritin from today is within normal limits, we will discontinue the iron.  She can return to clinic in 6 months. ?. ? ?HISTORY OF PRESENTING ILLNESS:  ? ?FLOREINE KINGDON 81 y.o. female is here because of anemia. ? ?This is a very pleasant 81 year old female patient with past medical history significant for right-sided breast cancer, cancer of the supraglottis status postradiation, type 2 diabetes, hypertension, kidney transplant referred to hematology for evaluation of slowly progressive anemia. ? ?Since last visit, she denies any complaints.  She was treated for supraglottic cancer with radiation.  No new changes except dry mouth since she completed radiation.  No hematochezia or melena reported.   She continues to take oral iron once a day.  She is tolerating this well. ? ?Rest of the pertinent 10 point ROS reviewed and negative. ? ?MEDICAL HISTORY:  ?Past Medical History:  ?Diagnosis Date  ? Alcohol abuse   ? Anemia   ? Arthritis   ? Breast cancer ([place]) 2010  ? Right  ? Chronic kidney disease   ? Colon polyp   ? Depression   ? Diabetes mellitus   ? GERD (gastroesophageal reflux disease)   ? Heart murmur   ? History of kidney stones 1976  ? HTN (hypertension)   ? Hypothyroidism   ? Osteopenia   ? Pancreatitis   ? Ulcerative esophagitis   ? ? ?SURGICAL HISTORY: ?Past Surgical History:  ?Procedure Laterality Date  ? ABDOMINAL HYSTERECTOMY    ? ABDOMINOPLASTY    ? APPENDECTOMY    ? BREAST BIOPSY Right 08/03/2008  ? Stereo Bx, Malignant  ? BREAST LUMPECTOMY Right 08/26/2008  ? CATARACT EXTRACTION W/ INTRAOCULAR LENS  IMPLANT, BILATERAL    ? CO2 LASER APPLICATION N/A 13/10/6576  ? Procedure: CO2 LASER APPLICATION;  Surgeon: Melida Quitter, MD;  Location: [facility];  Service: ENT;  Laterality: N/A;  ? COSMETIC SURGERY    ? ductal carcinoma    ? right breast   ? EYE SURGERY    ? HIP ARTHROPLASTY Right   ? INSERTION OF DIALYSIS CATHETER Right 09/25/2012  ? Procedure: INSERTION OF DIALYSIS CATHETER;  Surgeon: Mal Misty, MD;  Location: [facility];  Service: Vascular;  Laterality: Right;  Righ Internal Jugular Placement  ? IR GASTROSTOMY TUBE MOD SED  02/10/2020  ? IR GASTROSTOMY TUBE REMOVAL  05/25/2020  ? JOINT REPLACEMENT    ?  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  ? MICROLARYNGOSCOPY N/A 10/20/2019  ? Procedure: Suspended Microlaryngoscopy with Biopsy;  Surgeon: Melida Quitter, MD;  Location: [facility] OR;  Service: ENT;  Laterality: N/A;  ? MICROLARYNGOSCOPY N/A 01/09/2020  ? Procedure: MICROLARYNGOSCOPY WITH BIOPSY;  Surgeon: Melida Quitter, MD;  Location: [facility];  Service: ENT;  Laterality: N/A;  ? RIGID ESOPHAGOSCOPY N/A 10/20/2019  ? Procedure: RIGID ESOPHAGOSCOPY;  Surgeon: Melida Quitter, MD;  Location: [facility];  Service: ENT;   Laterality: N/A;  ? THYROIDECTOMY    ? TUMMY TUCK    ? ? ?SOCIAL HISTORY: ?Social History  ? ?Socioeconomic History  ? Marital status: Widowed  ?  Spouse name: Not on file  ? Number of children: Not on file  ? Years of education: Not on file  ? Highest education level: Not on file  ?Occupational History  ? Not on file  ?Tobacco Use  ? Smoking status: Former  ?  Types: Cigarettes  ?  Quit date: 09/25/1983  ?  Years since quitting: 37.8  ? Smokeless tobacco: Never  ?Vaping Use  ? Vaping Use: Never used  ?Substance and Sexual Activity  ? Alcohol use: Yes  ?  Comment: socially history of heavy drinking with intervention x 2-at least 2 occasions since april '14  ? Drug use: No  ? Sexual activity: Never  ?Other Topics Concern  ? Not on file  ?Social History Narrative  ? Not on file  ? ?Social Determinants of Health  ? ?Financial Resource Strain: Not on file  ?Food Insecurity: Not on file  ?Transportation Needs: Not on file  ?Physical Activity: Not on file  ?Stress: Not on file  ?Social Connections: Not on file  ?Intimate Partner Violence: Not on file  ? ? ?FAMILY HISTORY: ?Family History  ?Problem Relation Age of Onset  ? Thyroid cancer Daughter 26  ? Diabetes Father   ? Hypertension Father   ? Diabetes Brother   ? Hypertension Mother   ? Hypertension Sister   ? Colon polyps Brother   ? Atrial fibrillation Brother   ? Dementia Sister   ? Multiple myeloma Sister   ? ? ?ALLERGIES:  is allergic to penicillins, banana, detrol [tolterodine], quetiapine, risperidone and related, and lisinopril. ? ?MEDICATIONS:  ?Current Outpatient Medications  ?Medication Sig Dispense Refill  ? amLODipine (NORVASC) 10 MG tablet Take 10 mg by mouth at bedtime.    ? Cholecalciferol (VITAMIN D3) 5000 UNITS TABS Take 5,000 Units by mouth 3 (three) times daily after meals.     ? ferrous sulfate 324 MG TBEC Take 324 mg by mouth.    ? furosemide (LASIX) 40 MG tablet Take 1 tablet (40 mg total) by mouth daily. 90 tablet 3  ? glipiZIDE (GLUCOTROL XL) 2.5  MG 24 hr tablet Take 5 mg by mouth 2 (two) times daily.     ? insulin glargine, 1 Unit Dial, (TOUJEO SOLOSTAR) 300 UNIT/ML Solostar Pen 5 Units.    ? Lancets (FREESTYLE) lancets by Does not apply route.    ? levothyroxine (SYNTHROID) 200 MCG tablet Take 200 mcg by mouth daily before breakfast.     ? losartan (COZAAR) 25 MG tablet Take 25 mg by mouth at bedtime.     ? Melatonin 5 MG CAPS Take 5 mg by mouth See admin instructions. Take 5 mg 2 hours before bedtime    ? omeprazole (PRILOSEC) 20 MG capsule Take 20 mg by mouth daily.    ? Pancrelipase, Lip-Prot-Amyl, (CREON) 3000-9500 units CPEP Take 1  capsule by mouth 3 (three) times daily with meals.     ? Polyethyl Glycol-Propyl Glycol (SYSTANE OP) Place 1 drop into both eyes every evening.    ? pramipexole (MIRAPEX) 0.25 MG tablet Take 0.25-0.5 mg by mouth See admin instructions. Take 0.5 mg 2 hours prior to bedtime then 0.25 mg at bedtime    ? predniSONE (DELTASONE) 10 MG tablet Take 10 mg by mouth daily with breakfast.     ? raloxifene (EVISTA) 60 MG tablet Take 60 mg by mouth daily.    ? rosuvastatin (CRESTOR) 5 MG tablet Take 1 tablet (5 mg total) by mouth 2 (two) times a week. Take on Mondays and Thursdays. 30 tablet 3  ? tacrolimus (PROGRAF) 0.5 MG capsule Take 2 mg by mouth every 12 (twelve) hours.    ? tacrolimus (PROGRAF) 1 MG capsule Take 1 mg by mouth every 12 (twelve) hours.     ? temazepam (RESTORIL) 15 MG capsule Take 15-30 mg by mouth at bedtime.     ? vitamin B-12 (CYANOCOBALAMIN) 500 MCG tablet Take 500 mcg by mouth daily.    ? ?No current facility-administered medications for this visit.  ? ?PHYSICAL EXAMINATION: ? ?ECOG PERFORMANCE STATUS: 0 - Asymptomatic ? ?Vitals:  ? 07/21/21 1339  ?BP: (!) 164/61  ?Pulse: 78  ?Resp: 16  ?Temp: 98.5 ?F (36.9 ?C)  ?SpO2: 96%  ? ? ?Physical Exam ?Constitutional:   ?   Appearance: Normal appearance.  ?Cardiovascular:  ?   Rate and Rhythm: Normal rate and regular rhythm.  ?   Pulses: Normal pulses.  ?   Heart sounds:  Normal heart sounds.  ?Pulmonary:  ?   Effort: Pulmonary effort is normal.  ?   Breath sounds: Normal breath sounds.  ?Abdominal:  ?   General: Abdomen is flat. Bowel sounds are normal. There is no dis

## 2021-07-22 ENCOUNTER — Other Ambulatory Visit: Payer: Self-pay | Admitting: Obstetrics and Gynecology

## 2021-07-22 ENCOUNTER — Encounter: Payer: Self-pay | Admitting: Radiation Oncology

## 2021-07-22 ENCOUNTER — Ambulatory Visit
Admission: RE | Admit: 2021-07-22 | Discharge: 2021-07-22 | Disposition: A | Discharge status: Home / Self Care | Payer: Medicare Other | Source: Ambulatory Visit | Attending: Radiation Oncology | Admitting: Radiation Oncology

## 2021-07-22 ENCOUNTER — Other Ambulatory Visit: Payer: Self-pay

## 2021-07-22 DIAGNOSIS — M810 Age-related osteoporosis without current pathological fracture: Secondary | ICD-10-CM

## 2021-07-22 DIAGNOSIS — C3432 Malignant neoplasm of lower lobe, left bronchus or lung: Secondary | ICD-10-CM

## 2021-07-22 DIAGNOSIS — C321 Malignant neoplasm of supraglottis: Secondary | ICD-10-CM

## 2021-07-22 NOTE — Progress Notes (Signed)
?Radiation Oncology         [phone] ?________________________________ ? ?Name: NYARA CAPELL MRN: [id number]  ?Date: 07/22/2021  DOB: 22-Aug-1940 ? ?Follow-Up Visit Note by telephone.  The patient opted for telemedicine to maximize safety during the pandemic.  MyChart video was not obtainable. ? ? ?CC: Lawerance Cruel, MD  Melida Quitter, MD ? ?Diagnosis and Prior Radiotherapy:     ?  ICD-10-CM   ?1. 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  ?2. 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  ? ? ? ? Cancer Staging  ?Malignant neoplasm of supraglottis ([place]) ?Staging form: Larynx - Supraglottis, AJCC 8th Edition ?- Clinical stage from 01/20/2020: Stage II (cT2, cN0, cM0) - Signed by Eppie Gibson, MD on 06/22/2020 ?Stage prefix: Initial diagnosis ? ? ?Radiation Treatment Dates: 02/09/2020 through 03/31/2020 ?Site Technique Total Dose (Gy) Dose per Fx (Gy) Completed Fx Beam Energies  ?Larynx: HN_supragl IMRT 70/70 2 35/35 6X  ? ? ?Radiation Treatment Dates: 01/04/2021 through 01/14/2021 ?Site Technique Total Dose (Gy) Dose per Fx (Gy) Completed Fx Beam Energies  ?Lung, Left: Lung_Lt_lower IMRT 60/60 12 5/5 6XFFF  ? ?CHIEF COMPLAINT:  Here for follow-up and surveillance of lung/ throat cancer ? ?Narrative:     She is doing well. Opted for telemedicine today due to concern re: virus infection.   ? ?She has no new symptomatic complaints.  I personally reviewed her chest CT scan and previous SBRT lung plan ? ?ALLERGIES:  is allergic to penicillins, banana, detrol [tolterodine], quetiapine, risperidone and related, and lisinopril. ? ?Meds: ?Current Outpatient Medications  ?Medication Sig Dispense Refill  ? amLODipine (NORVASC) 10 MG tablet Take 10 mg by mouth at bedtime.    ? Cholecalciferol (VITAMIN D3) 5000 UNITS TABS Take 5,000 Units by mouth 3 (three) times daily after meals.     ? ferrous sulfate 324 MG TBEC Take 324 mg by mouth.    ? furosemide (LASIX) 40 MG tablet Take 1 tablet (40 mg  total) by mouth daily. 90 tablet 3  ? glipiZIDE (GLUCOTROL XL) 2.5 MG 24 hr tablet Take 5 mg by mouth 2 (two) times daily.     ? insulin glargine, 1 Unit Dial, (TOUJEO SOLOSTAR) 300 UNIT/ML Solostar Pen 5 Units.    ? Lancets (FREESTYLE) lancets by Does not apply route.    ? levothyroxine (SYNTHROID) 200 MCG tablet Take 200 mcg by mouth daily before breakfast.     ? losartan (COZAAR) 25 MG tablet Take 25 mg by mouth at bedtime.     ? Melatonin 5 MG CAPS Take 5 mg by mouth See admin instructions. Take 5 mg 2 hours before bedtime    ? omeprazole (PRILOSEC) 20 MG capsule Take 20 mg by mouth daily.    ? Pancrelipase, Lip-Prot-Amyl, (CREON) 3000-9500 units CPEP Take 1 capsule by mouth 3 (three) times daily with meals.     ? Polyethyl Glycol-Propyl Glycol (SYSTANE OP) Place 1 drop into both eyes every evening.    ? pramipexole (MIRAPEX) 0.25 MG tablet Take 0.25-0.5 mg by mouth See admin instructions. Take 0.5 mg 2 hours prior to bedtime then 0.25 mg at bedtime    ? predniSONE (DELTASONE) 10 MG tablet Take 10 mg by mouth daily with breakfast.     ? raloxifene (EVISTA) 60 MG tablet Take 60 mg by mouth daily.    ? rosuvastatin (CRESTOR) 5 MG tablet Take 1 tablet (5 mg total) by mouth  2 (two) times a week. Take on Mondays and Thursdays. 30 tablet 3  ? tacrolimus (PROGRAF) 0.5 MG capsule Take 2 mg by mouth every 12 (twelve) hours.    ? tacrolimus (PROGRAF) 1 MG capsule Take 1 mg by mouth every 12 (twelve) hours.     ? temazepam (RESTORIL) 15 MG capsule Take 15-30 mg by mouth at bedtime.     ? vitamin B-12 (CYANOCOBALAMIN) 500 MCG tablet Take 500 mcg by mouth daily.    ? ?No current facility-administered medications for this encounter.  ? ? ?Physical Findings: ?The patient is in no acute distress. Patient is alert and oriented. ?Wt Readings from Last 3 Encounters:  ?07/21/21 125 lb 8 oz (56.9 kg)  ?03/11/21 124 lb (56.2 kg)  ?02/09/21 124 lb (56.2 kg)  ? ? vitals were not taken for this visit. Marland Kitchen  ?General: Alert and oriented, in  no acute distress  ?  ? ?Lab Findings: ?Lab Results  ?Component Value Date  ? WBC 6.0 07/21/2021  ? HGB 10.0 (L) 07/21/2021  ? HCT 30.2 (L) 07/21/2021  ? MCV 97.4 07/21/2021  ? PLT 187 07/21/2021  ? ? ?Lab Results  ?Component Value Date  ? TSH 0.059 (L) 07/21/2021  ? ? ?Radiographic Findings: ?CT Chest Wo Contrast ? ?Result Date: 07/18/2021 ?CLINICAL DATA:  Restaging lung cancer. History of head and neck cancer and recent radiation therapy for left lung cancer. EXAM: CT CHEST WITHOUT CONTRAST TECHNIQUE: Multidetector CT imaging of the chest was performed following the standard protocol without IV contrast. RADIATION DOSE REDUCTION: This exam was performed according to the departmental dose-optimization program which includes automated exposure control, adjustment of the mA and/or kV according to patient size and/or use of iterative reconstruction technique. COMPARISON:  Chest CT 04/18/2021 and PET-CT 12/14/2020 FINDINGS: Cardiovascular: The heart is normal in size. No pericardial effusion. Stable advanced atherosclerotic calcification involving the thoracic aorta and coronary arteries. No aneurysm. Mediastinum/Nodes: Small scattered mediastinal and hilar lymph nodes but no mass or overt adenopathy. No supraclavicular or axillary adenopathy. Surgical changes from a thyroidectomy Lungs/Pleura: Extensive area of airspace opacity in the left lower lobe with scattered air bronchograms. 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 The pulmonary nodule is obscured. The right upper lobe ground-glass nodule is slightly larger measuring 18 mm. It also has a slightly larger more solid-appearing central component which measures 6 mm and previously measured 3.5 mm. Findings suspicious for slow growing adenocarcinoma. Small subpleural left upper lobe nodule on image 32/5 measuring 3 mm. Patchy tree-in-bud nodularity in the lower lobes bilaterally peripherally likely inflammation or atypical infection such as MAC.  This appears stable. No new pulmonary lesions. Upper Abdomen: No significant upper abdominal findings. Stable advanced vascular calcifications. Small atrophied kidneys are again demonstrated. Scattered pancreatic calcifications likely due to remote pancreatitis. Stable left adrenal gland adenoma. Musculoskeletal: No significant findings. IMPRESSION: 1. 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 2. The left lower lobe pulmonary nodule is obscured. 3. Slight interval increase in size of the right upper lobe ground-glass nodule with a slightly larger more solid-appearing central component. Findings suspicious for slow growing adenocarcinoma. Recommend close observation. 4. Stable tree-in-bud nodularity in the lower lobes bilaterally likely inflammation or atypical infection such as MAC. 5. Stable advanced atherosclerotic calcification involving the aorta and coronary arteries. 6. Stable left adrenal gland adenoma. Aortic Atherosclerosis (ICD10-I70.0) and Emphysema (ICD10-J43.9). Electronically Signed   By: Mamie Nick.  Gallerani M.D.   On: 07/18/2021 15:16  ? ?US Venous Img Lower Unilateral Left (DVT) ? ?Result Date: 07/20/2021 ?CLINICAL DATA:  Left lower extremity pain and swelling EXAM: LEFT LOWER EXTREMITY VENOUS DOPPLER ULTRASOUND TECHNIQUE: Gray-scale sonography with graded compression, as well as color Doppler and duplex ultrasound were performed to evaluate the lower extremity deep venous systems from the level of the common femoral vein and including the common femoral, femoral, profunda femoral, popliteal and calf veins including the posterior tibial, peroneal and gastrocnemius veins when visible. The superficial great saphenous vein was also interrogated. Spectral Doppler was utilized to evaluate flow at rest and with distal augmentation maneuvers in the common femoral, femoral and popliteal veins. COMPARISON:  None.  FINDINGS: Contralateral Common Femoral Vein: Respiratory phasicity is normal and symmetric with the symptomatic side. No evidence of thrombus. Normal compressibility. Common Femoral Vein: No evidence of throm

## 2021-07-22 NOTE — Progress Notes (Signed)
Copy of patient's most recent lab work faxed to PCP's office at [phone]. Provided my direct call back number for nurse or other staff member to call back and confirm that Dr. Harrington Challenger received/reviewed results.  ? ?Received confirmation notice that fax went through successfully.  ?

## 2021-07-25 ENCOUNTER — Telehealth: Payer: Self-pay

## 2021-07-25 NOTE — Telephone Encounter (Signed)
Received call from Dr. Alan Ripper nurse Pamala Hurry) that they see patient's most recent lab work, and are aware of her depressed TSH. She confirmed she would relay to Dr. Harrington Challenger to see if he recommended adjusting patient's levothyroxine  ?

## 2021-07-27 ENCOUNTER — Other Ambulatory Visit: Payer: Self-pay | Admitting: Obstetrics and Gynecology

## 2021-07-27 ENCOUNTER — Other Ambulatory Visit: Payer: Self-pay | Admitting: Family Medicine

## 2021-07-27 DIAGNOSIS — M810 Age-related osteoporosis without current pathological fracture: Secondary | ICD-10-CM

## 2021-08-25 ENCOUNTER — Ambulatory Visit
Admission: RE | Admit: 2021-08-25 | Discharge: 2021-08-25 | Disposition: A | Discharge status: Home / Self Care | Payer: Medicare Other | Source: Ambulatory Visit | Attending: Obstetrics and Gynecology | Admitting: Obstetrics and Gynecology

## 2021-08-25 ENCOUNTER — Ambulatory Visit
Admission: RE | Admit: 2021-08-25 | Discharge: 2021-08-25 | Disposition: A | Discharge status: Home / Self Care | Payer: Medicare Other | Source: Ambulatory Visit | Attending: Family Medicine | Admitting: Family Medicine

## 2021-08-25 DIAGNOSIS — M810 Age-related osteoporosis without current pathological fracture: Secondary | ICD-10-CM

## 2021-09-01 ENCOUNTER — Ambulatory Visit: Payer: Medicare Other | Admitting: Podiatry

## 2021-10-21 ENCOUNTER — Ambulatory Visit
Admission: RE | Admit: 2021-10-21 | Discharge: 2021-10-21 | Disposition: A | Discharge status: Home / Self Care | Payer: Medicare Other | Source: Ambulatory Visit | Attending: Radiation Oncology | Admitting: Radiation Oncology

## 2021-10-21 DIAGNOSIS — C321 Malignant neoplasm of supraglottis: Secondary | ICD-10-CM

## 2021-10-21 DIAGNOSIS — C3432 Malignant neoplasm of lower lobe, left bronchus or lung: Secondary | ICD-10-CM

## 2021-10-24 NOTE — Progress Notes (Signed)
Anita Pollard presents today for follow-up after completing radiation to her supraglottis on 03/31/2020 and left lower lung on 01/14/2021  Pain issues, if any: Patient denies Using a feeding tube?: N/A Weight changes, if any:  Wt Readings from Last 3 Encounters:  10/25/21 124 lb (56.2 kg)  07/21/21 125 lb 8 oz (56.9 kg)  03/11/21 124 lb (56.2 kg)   Swallowing issues, if any: Denies any new issues or concerns. Reports she's found he new normal able to tolerate more solid foods than before Smoking or chewing tobacco? None Using fluoride trays daily? Reports she had 2 teeth extracted recently  Last ENT visit was on: 03/23/2022 Saw Dr. Melida Quitter  --Impression & Plans: Fiberoptic exam demonstrates no sign of recurrence now at about 1 year from treatment. She was reassured. Of course, she has had to undergo treatment for chest involvement, unfortunately. She will follow with oncology. I recommended follow-up with me in six months.  Other notable issues, if any: Had F/U with medical oncologist Dr. Chryl Heck on 07/21/2021. Denies any new repiratory concerns. Currently recovering from UTI and GI issues. Admits to being over whelmed helping close friends with personal issues

## 2021-10-25 ENCOUNTER — Ambulatory Visit
Admission: RE | Admit: 2021-10-25 | Discharge: 2021-10-25 | Disposition: A | Discharge status: Home / Self Care | Payer: Medicare Other | Source: Ambulatory Visit | Attending: Radiation Oncology | Admitting: Radiation Oncology

## 2021-10-25 ENCOUNTER — Other Ambulatory Visit: Payer: Self-pay

## 2021-10-25 VITALS — BP 144/50 | HR 79 | Temp 97.3°F | Resp 18 | Ht 59.0 in | Wt 124.0 lb

## 2021-10-25 DIAGNOSIS — N390 Urinary tract infection, site not specified: Secondary | ICD-10-CM | POA: Insufficient documentation

## 2021-10-25 DIAGNOSIS — Z888 Allergy status to other drugs, medicaments and biological substances status: Secondary | ICD-10-CM | POA: Diagnosis not present

## 2021-10-25 DIAGNOSIS — Z79899 Other long term (current) drug therapy: Secondary | ICD-10-CM | POA: Diagnosis not present

## 2021-10-25 DIAGNOSIS — D3502 Benign neoplasm of left adrenal gland: Secondary | ICD-10-CM | POA: Insufficient documentation

## 2021-10-25 DIAGNOSIS — J432 Centrilobular emphysema: Secondary | ICD-10-CM | POA: Insufficient documentation

## 2021-10-25 DIAGNOSIS — N200 Calculus of kidney: Secondary | ICD-10-CM | POA: Insufficient documentation

## 2021-10-25 DIAGNOSIS — C321 Malignant neoplasm of supraglottis: Secondary | ICD-10-CM | POA: Insufficient documentation

## 2021-10-25 DIAGNOSIS — I2510 Atherosclerotic heart disease of native coronary artery without angina pectoris: Secondary | ICD-10-CM | POA: Diagnosis not present

## 2021-10-25 DIAGNOSIS — C3432 Malignant neoplasm of lower lobe, left bronchus or lung: Secondary | ICD-10-CM

## 2021-10-25 DIAGNOSIS — Z79890 Hormone replacement therapy (postmenopausal): Secondary | ICD-10-CM | POA: Diagnosis not present

## 2021-10-25 DIAGNOSIS — I700 Atherosclerosis of aorta: Secondary | ICD-10-CM | POA: Insufficient documentation

## 2021-10-25 DIAGNOSIS — Z880 Allergy status to penicillin: Secondary | ICD-10-CM | POA: Insufficient documentation

## 2021-10-25 MED ORDER — OXYMETAZOLINE HCL 0.05 % NA SOLN
2.0000 | Freq: Once | NASAL | Status: AC
  Administered 2021-10-25: 2 via NASAL
  Filled 2021-10-25: qty 30

## 2021-10-26 ENCOUNTER — Encounter: Payer: Self-pay | Admitting: Radiation Oncology

## 2021-10-26 NOTE — Progress Notes (Signed)
Radiation Oncology         [phone] ________________________________  Name: Anita Pollard MRN: [id number]  Date: 10/25/2021  DOB: 1941/01/15  Follow-Up Visit Note   OUTPATIENT, in person   CC: Lawerance Cruel, MD  Melida Quitter, MD  Diagnosis and Prior Radiotherapy:       ICD-10-CM   1. Malignant neoplasm of supraglottis ([place])  C32.1 Fiberoptic laryngoscopy    oxymetazoline (AFRIN) 0.05 % nasal spray 2 spray    2. 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)  C34.32         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([place]) Staging form: Larynx - Supraglottis, AJCC 8th Edition - Clinical stage from 01/20/2020: Stage II (cT2, cN0, cM0) - Signed by Eppie Gibson, MD on 06/22/2020 Stage prefix: Initial diagnosis   Radiation Treatment Dates: 02/09/2020 through 03/31/2020 Site Technique Total Dose (Gy) Dose per Fx (Gy) Completed Fx Beam Energies  Larynx: HN_supragl IMRT 70/70 2 35/35 6X    Radiation Treatment Dates: 01/04/2021 through 01/14/2021 Site Technique Total Dose (Gy) Dose per Fx (Gy) Completed Fx Beam Energies  Lung, Left: Lung_Lt_lower IMRT 60/60 12 5/5 6XFFF   CHIEF COMPLAINT:  Here for follow-up and surveillance of lung/ throat cancer  Narrative:    Anita Pollard presents today for follow-up after completing radiation to her supraglottis on 03/31/2020 and left lower lung on 01/14/2021  Pain issues, if any: Patient denies Using a feeding tube?: N/A Weight changes, if any:  Wt Readings from Last 3 Encounters:  10/25/21 124 lb (56.2 kg)  07/21/21 125 lb 8 oz (56.9 kg)  03/11/21 124 lb (56.2 kg)   Swallowing issues, if any: Denies any new issues or concerns. Reports she's found he new normal able to tolerate more solid foods than before Smoking or chewing tobacco? None Using fluoride trays daily? Reports she had 2 teeth extracted recently  Last ENT visit was on: 03/23/2022 Saw Dr. Melida Quitter  --Impression & Plans: Fiberoptic exam demonstrates  no sign of recurrence now at about 1 year from treatment. She was reassured. Of course, she has had to undergo treatment for chest involvement, unfortunately. She will follow with oncology. I recommended follow-up with me in six months.  Other notable issues, if any: Had F/U with medical oncologist Dr. Chryl Heck on 07/21/2021. Denies any new repiratory concerns. Currently recovering from UTI and GI issues. Admits to being over whelmed helping close friends with personal issues    ALLERGIES:  is allergic to penicillins, banana, detrol [tolterodine], quetiapine, risperidone and related, and lisinopril.  Meds: Current Outpatient Medications  Medication Sig Dispense Refill   amLODipine (NORVASC) 10 MG tablet Take 10 mg by mouth at bedtime.     Cholecalciferol (VITAMIN D3) 5000 UNITS TABS Take 5,000 Units by mouth 3 (three) times daily after meals.      ferrous sulfate 324 MG TBEC Take 324 mg by mouth.     furosemide (LASIX) 40 MG tablet Take 1 tablet (40 mg total) by mouth daily. 90 tablet 3   glipiZIDE (GLUCOTROL XL) 2.5 MG 24 hr tablet Take 5 mg by mouth 2 (two) times daily.      insulin glargine, 1 Unit Dial, (TOUJEO SOLOSTAR) 300 UNIT/ML Solostar Pen 5 Units.     Lancets (FREESTYLE) lancets by Does not apply route.     levothyroxine (SYNTHROID) 200 MCG tablet Take 200 mcg by mouth daily before breakfast.      losartan (COZAAR) 25 MG tablet Take 25 mg  by mouth at bedtime.      Melatonin 5 MG CAPS Take 5 mg by mouth See admin instructions. Take 5 mg 2 hours before bedtime     nitrofurantoin, macrocrystal-monohydrate, (MACROBID) 100 MG capsule Take 100 mg by mouth 2 (two) times daily.     omeprazole (PRILOSEC) 20 MG capsule Take 20 mg by mouth daily.     Polyethyl Glycol-Propyl Glycol (SYSTANE OP) Place 1 drop into both eyes every evening.     pramipexole (MIRAPEX) 0.25 MG tablet Take 0.25-0.5 mg by mouth See admin instructions. Take 0.5 mg 2 hours prior to bedtime then 0.25 mg at bedtime      predniSONE (DELTASONE) 10 MG tablet Take 10 mg by mouth daily with breakfast.      raloxifene (EVISTA) 60 MG tablet Take 60 mg by mouth daily.     rosuvastatin (CRESTOR) 5 MG tablet Take 1 tablet (5 mg total) by mouth 2 (two) times a week. Take on Mondays and Thursdays. 30 tablet 3   tacrolimus (PROGRAF) 0.5 MG capsule Take 2 mg by mouth every 12 (twelve) hours.     temazepam (RESTORIL) 15 MG capsule Take 15-30 mg by mouth at bedtime.      vitamin B-12 (CYANOCOBALAMIN) 500 MCG tablet Take 500 mcg by mouth daily.     No current facility-administered medications for this encounter.    Physical Findings: The patient is in no acute distress. Patient is alert and oriented. Wt Readings from Last 3 Encounters:  10/25/21 124 lb (56.2 kg)  07/21/21 125 lb 8 oz (56.9 kg)  03/11/21 124 lb (56.2 kg)    height is 4\' 11"  (1.499 m) and weight is 124 lb (56.2 kg). Her temperature is 97.3 F (36.3 C) (abnormal). Her blood pressure is 144/50 (abnormal) and her pulse is 79. Her respiration is 18 and oxygen saturation is 97%. .  General: Alert and oriented, in no acute distress HEENT: Head is normocephalic. Extraocular movements are intact. Oropharynx is clear. Neck: Neck is supple, no palpable cervical or supraclavicular lymphadenopathy. Heart: Regular in rate and rhythm with no murmurs, rubs, or gallops. Chest: Clear to auscultation bilaterally, with no rhonchi, wheezes, or rales. Lymphatics: see Neck Exam Musculoskeletal: symmetric strength and muscle tone throughout. Neurologic: Cranial nerves II through XII are grossly intact. No obvious focalities. Speech is fluent. Coordination is intact. Psychiatric: Judgment and insight are intact. Affect is appropriate.   PROCEDURE NOTE: After obtaining consent and spraying nasal cavity with topical oxymetazoline, the flexible endoscope was coated w/ lidocaine gel and introduced and passed through the nasal cavity.  The nasopharynx, oropharynx, hypopharynx, and  larynx  were then examined. No lesions appreciated in the mucosal axis. The true cords were symmetrically mobile. She tolerated this well.   Lab Findings: Lab Results  Component Value Date   WBC 6.0 07/21/2021   HGB 10.0 (L) 07/21/2021   HCT 30.2 (L) 07/21/2021   MCV 97.4 07/21/2021   PLT 187 07/21/2021    Lab Results  Component Value Date   TSH 0.059 (L) 07/21/2021    Radiographic Findings: CT Chest Wo Contrast  Result Date: 10/22/2021 CLINICAL DATA:  Head and neck cancer, monitor lung nodule, left lower lobe lung cancer, epiglottic cancer, thyroidectomy, right breast lumpectomy * Tracking Code: BO * EXAM: CT CHEST WITHOUT CONTRAST TECHNIQUE: Multidetector CT imaging of the chest was performed following the standard protocol without IV contrast. RADIATION DOSE REDUCTION: This exam was performed according to the departmental dose-optimization program which includes automated exposure  control, adjustment of the mA and/or kV according to patient size and/or use of iterative reconstruction technique. COMPARISON:  07/18/2021 FINDINGS: Cardiovascular: Aortic atherosclerosis. Normal heart size. Left coronary artery calcifications. No pericardial effusion. Mediastinum/Nodes: No enlarged mediastinal, hilar, or axillary lymph nodes. Status post thyroidectomy. Trachea, and esophagus demonstrate no significant findings. Lungs/Pleura: Mild centrilobular emphysema. Interval increase in dense consolidation about a previously seen nodule of the dependent left lower lobe, consolidated area measuring approximately 2.6 x 1.8 cm, previously 4.1 x 2.2 cm (series 8, image 56). Underlying nodule is obscured. Unchanged subsolid nodule of the posterior right upper lobe measuring 1.6 x 1.1 cm, central, partially solid component measuring 0.6 cm (series 8, image 38). No pleural effusion or pneumothorax. Upper Abdomen: No acute abnormality. Atrophic appearing kidneys. Multiple small bilateral nonobstructive renal calculi  and or renal vascular calcifications. Stable, benign left adrenal adenoma, for which no further follow-up or characterization is required (series 2, image 119). Musculoskeletal: No chest wall abnormality. No suspicious osseous lesions identified. IMPRESSION: 1. Interval increase in dense consolidation about a previously seen nodule of the dependent left lower lobe, consistent with expected development of radiation fibrosis. Underlying nodule is obscured. 2. Unchanged subsolid nodule of the posterior right upper lobe measuring 1.6 x 1.1 cm, central, partially solid component measuring 0.6 cm. 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 Attention on follow-up. 3. Emphysema. 4. Coronary artery disease. Aortic Atherosclerosis (ICD10-I70.0) and Emphysema (ICD10-J43.9). Electronically Signed   By: Delanna Ahmadi M.D.   On: 10/22/2021 06:51     Impression/Plan:    1) Head and Neck Cancer / lung cancer Status: I personally reviewed her chest imaging  - CT imaging shows likely scar tissue from therapy in the left lower lung and there is still a groundglass opacity in the right upper lung that could be a slow-growing cancer versus benign etiology.  We discussed pros and cons of biopsy vs observation.  She and I agree to proceed with repeat imaging in  4 months and follow-up with me in person at that time so that she can also have a thorough head and neck exam.    2) Nutritional Status: no concerns stated  3) Risk Factors: The patient has been educated about risk factors including alcohol and tobacco abuse; they understand that avoidance of alcohol and tobacco is important to prevent recurrences as well as other cancers  4) Swallowing:   Continue swallowing exercises per SLP  5) Dental: Encouraged to continue regular followup with dentistry, and dental hygiene including fluoride rinses.   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  She will continue supplementation and labs per her PCP doctor's instructions.    Lab Results  Component Value Date   TSH 0.059 (L) 07/21/2021      On date of service I spent 25 minutes on this encounter.  Patient was seen face-to-face.  Note was signed today after encounter.  Minutes pertain to date of service only.   Eppie Gibson, MD

## 2021-11-07 ENCOUNTER — Ambulatory Visit: Payer: Self-pay

## 2021-11-07 NOTE — Patient Outreach (Signed)
  Care Coordination   11/07/2021 Name: Anita Pollard MRN: [id number] DOB: 1940-10-27   Care Coordination Outreach Attempts:  An unsuccessful telephone outreach was attempted today to offer the patient information about available care coordination services as a benefit of their health plan.   Follow Up Plan:  Additional outreach attempts will be made to offer the patient care coordination information and services.   Encounter Outcome:  No Answer  Care Coordination Interventions Activated:  No   Care Coordination Interventions:  No, not indicated    [facility] [phone]

## 2022-01-02 IMAGING — CT CT CHEST W/O CM
2 of 4 series · 15 of 36 positions shown, 18 images · non-contrast
Comparison: PET-CT 07/19/2020

CLINICAL DATA: Head and neck cancer.  Routine surveillance.

EXAM:
CT CHEST WITHOUT CONTRAST
TECHNIQUE: Multidetector CT imaging of the chest was performed following the
standard protocol without IV contrast.

[Series 2: thorax · axial · 0.78mm/px · z∈[+1442,+1678]mm · 12 of 140 slices shown, 15 images]
[im 11/140  mediastinal]
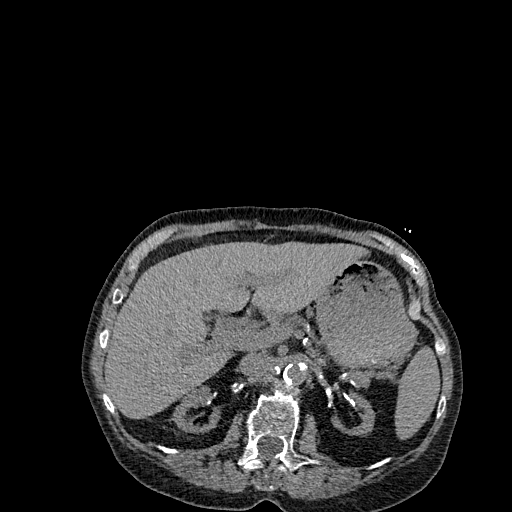
[im 11/140  lung]
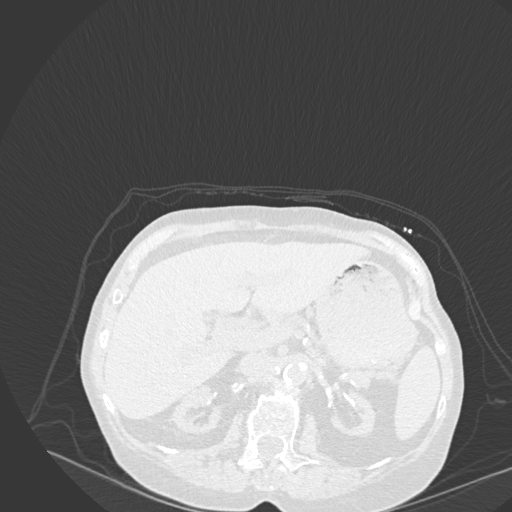
[im 22/140  lung]
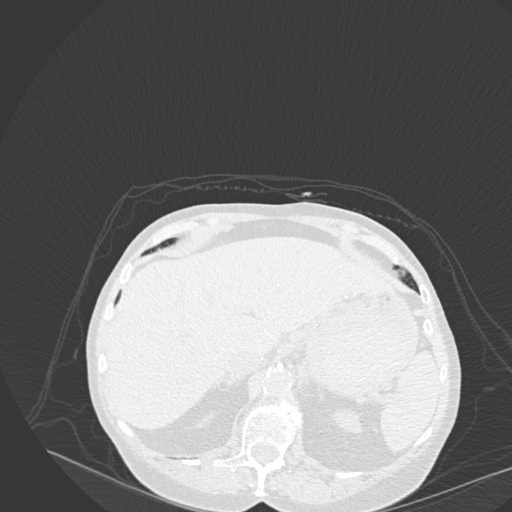
[im 33/140  lung]
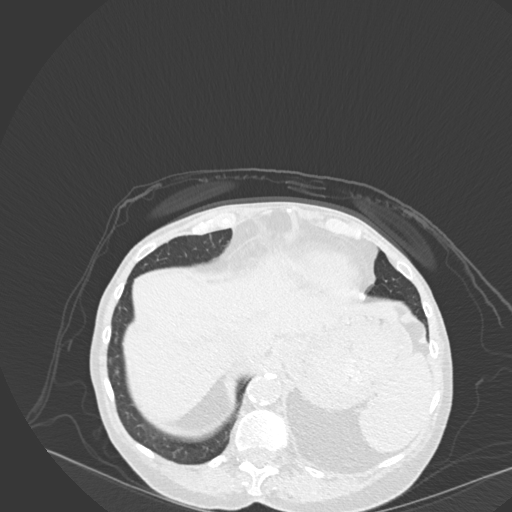
[im 43/140  lung]
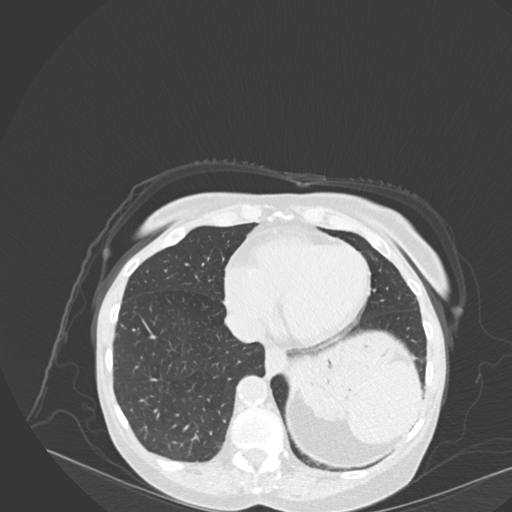
[im 54/140  mediastinal]
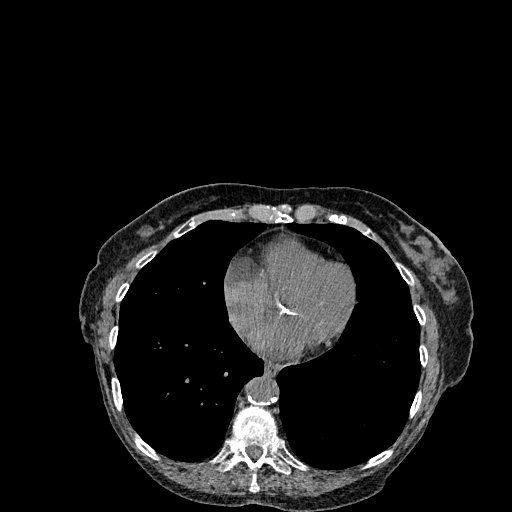
[im 54/140  lung]
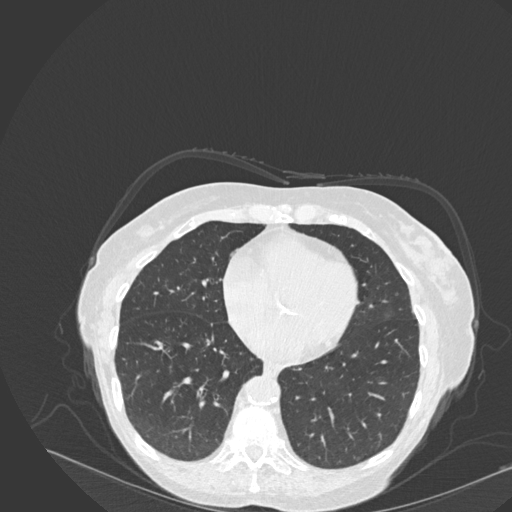
[im 65/140  lung]
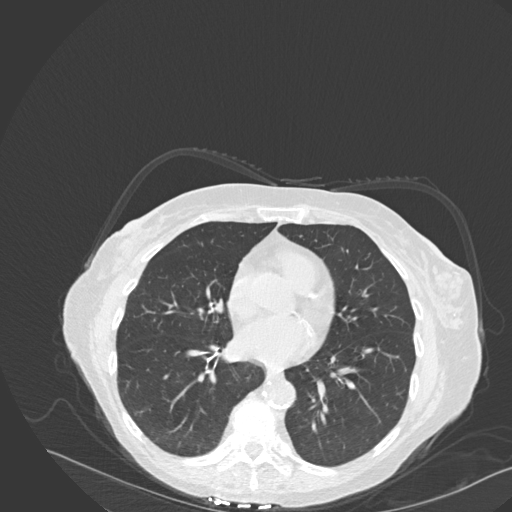
[im 75/140  lung]
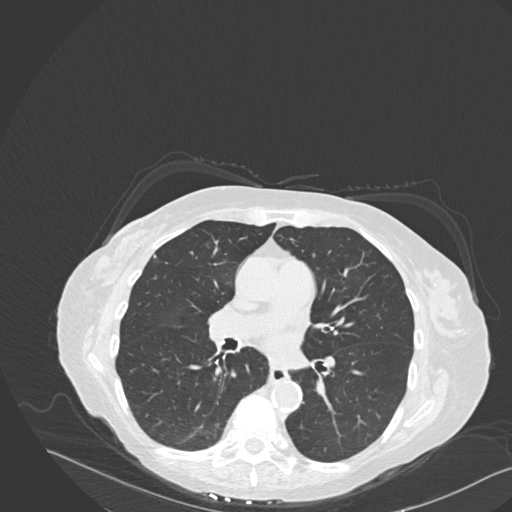
[im 86/140  lung]
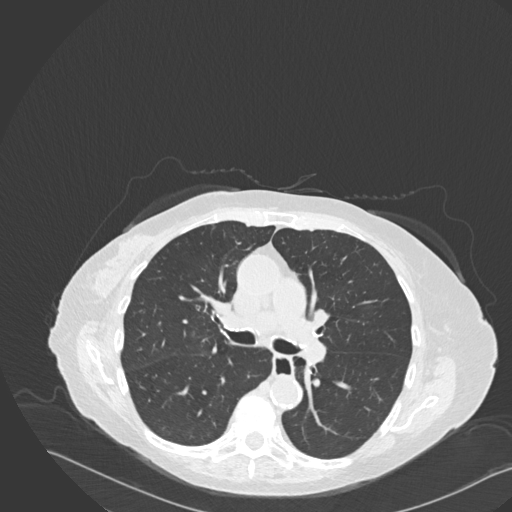
[im 97/140  mediastinal]
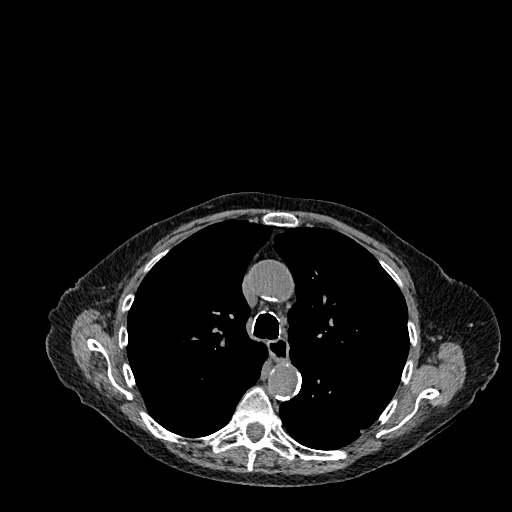
[im 97/140  lung]
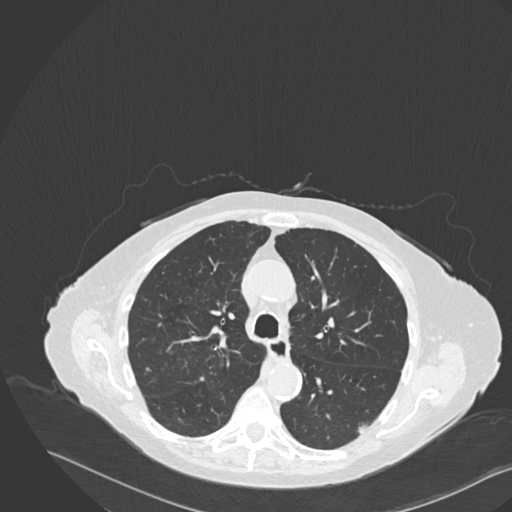
[im 107/140  lung]
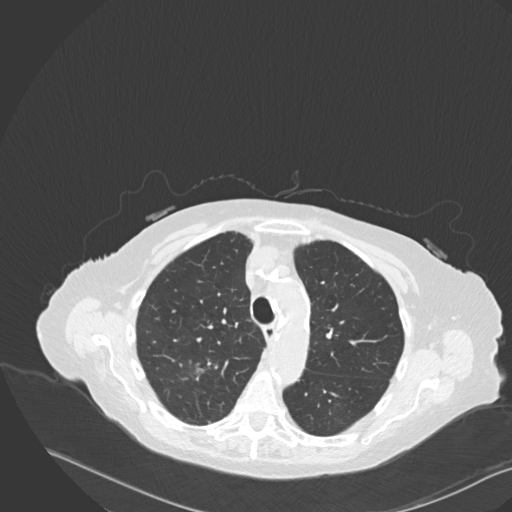
[im 118/140  lung]
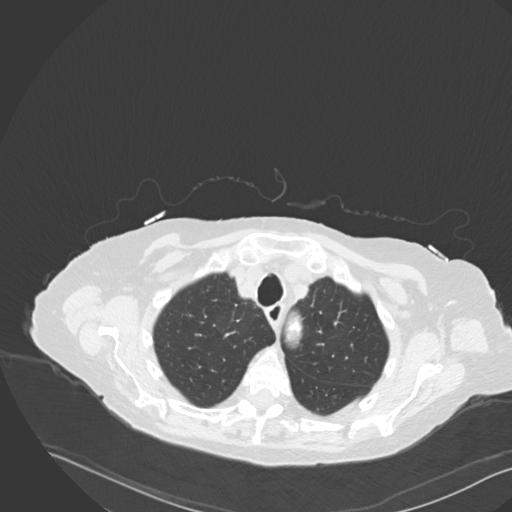
[im 129/140  lung]
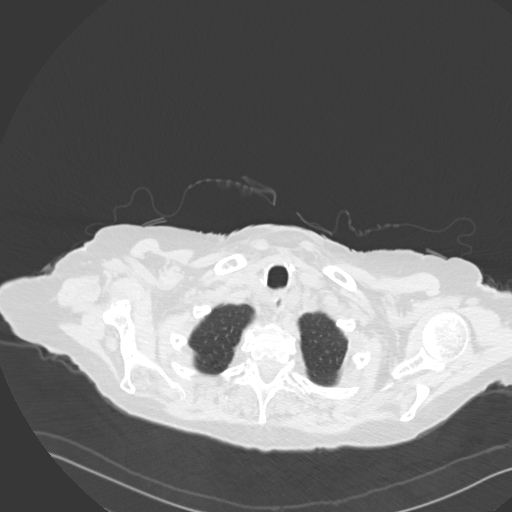

[Series 5: coronal · coronal · 0.65mm/px · 3 of 114 slices shown]
[im 23/114  lung]
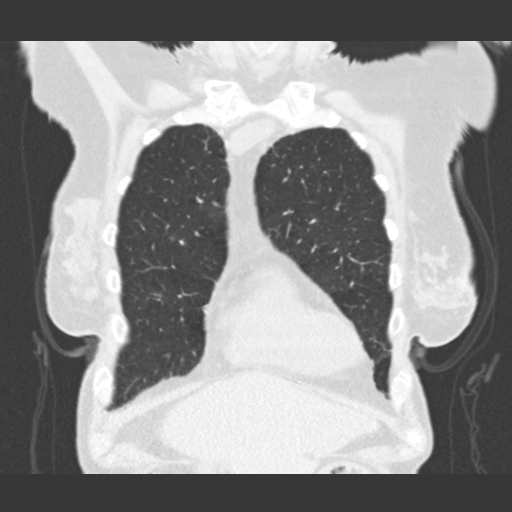
[im 46/114  lung]
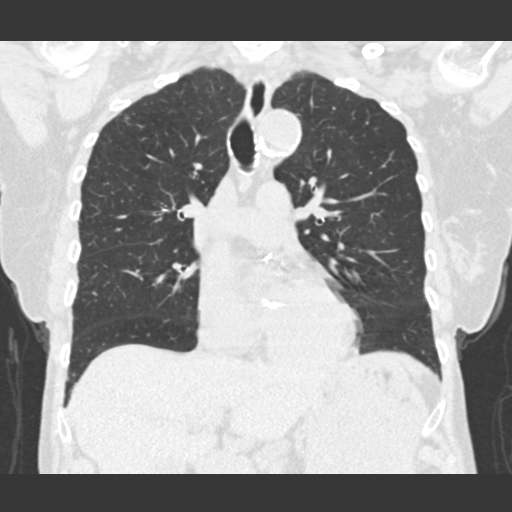
[im 68/114  lung]
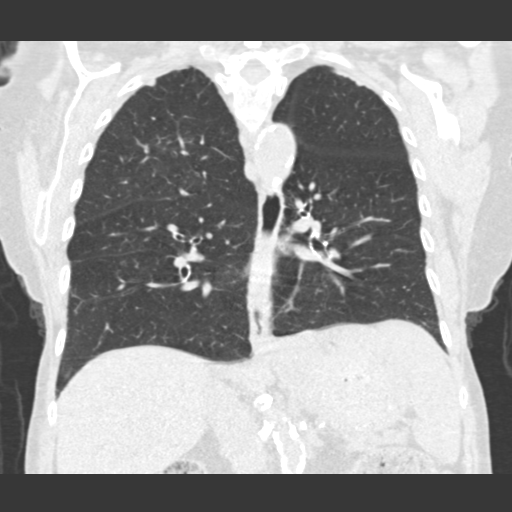

[15 of 36 positions shown; findings below may reference images not displayed]

FINDINGS: Cardiovascular: Heart size is within normal limits. Aortic
atherosclerosis. Coronary artery calcifications. No pericardial
effusion.

Mediastinum/Nodes: Thyroidectomy. The trachea appears patent and is
midline. Normal appearance of the esophagus. No enlarged axillary,
supraclavicular, or mediastinal lymph nodes. Hilar structures are
suboptimally evaluated due to lack of IV contrast.

Lungs/Pleura: No pleural effusion. No airspace consolidation,
atelectasis, or pneumothorax. Scattered lung nodules are again
noted, including:

-Subpleural nodule within the posterolateral left lower lobe
measures 1.2 cm, image 46/7. This is increased from 0.6 cm on
01/29/2020. More recently, on the PET-CT from 07/19/2020 this
measured 0.9 cm and had an SUV max of 1.9.

-Non solid nodule within the right upper lobe measures 1.5 cm, image
32/7. Unchanged from 07/19/2020.

-Small calcified granuloma noted within the basilar right upper
lobe.

-Irregular, triangular-shaped nodule within the right lower lobe is
new from the previous exam measuring 7 mm, image 73/7.

-Stable tiny subpleural nodule within the posterior left apex
measures 3 mm, image [DATE].

Upper Abdomen: No acute abnormality noted within the imaged portions
of the upper abdomen. Extensive aortic atherosclerosis. Bilateral
renal cortical atrophy. Again seen is a 0.9 x 0.7 cm stone at the
left UPJ without hydronephrosis. Calcifications and parenchymal
atrophy throughout the pancreas compatible with changes secondary to
chronic pancreatitis.

Musculoskeletal: No acute or suspicious osseous findings.
Degenerative changes noted within the lower thoracic and upper
lumbar spine.
IMPRESSION: 1. There has been interval increase in size of left lower lobe
subpleural nodule which now measures 1.2 cm versus 0.6 cm on
01/29/2020 and 0.9 cm on 07/19/2020. Despite the low-level FDG
uptake on previous exam (SUV max 1.9) the progressive nature of this
nodule is suspicious for either primary bronchogenic carcinoma or
metastatic disease.
2. There is a new, triangular-shaped nodule within the right lower
lobe measuring 7 mm. This has a nonspecific appearance and may be
postinflammatory/infectious in etiology. Attention on future
surveillance imaging is advised.
3. Persistent non solid nodule within the right upper lobe measures
1.5 cm. Adenocarcinoma cannot be excluded. Follow up by CT is
recommended in 12 months, with continued annual surveillance for a
minimum of 3 years. These recommendations are taken from:
Recommendations for the Management of Subsolid Pulmonary Nodules
Detected at CT: A Statement from [REDACTED]
8642; [DATE], [id number].
4. Aortic atherosclerosis. Coronary artery calcifications.
5. Left UPJ calculus. No hydronephrosis.
6. Changes secondary to chronic pancreatitis.

Aortic Atherosclerosis (K1QI2-JIU.U).

## 2022-01-11 ENCOUNTER — Telehealth: Payer: Self-pay

## 2022-01-11 NOTE — Patient Outreach (Signed)
  Care Coordination   01/11/2022 Name: Anita Pollard MRN: [id number] DOB: 08/22/1940   Care Coordination Outreach Attempts:  A second unsuccessful outreach was attempted today to offer the patient with information about available care coordination services as a benefit of their health plan.     Follow Up Plan:  Additional outreach attempts will be made to offer the patient care coordination information and services.   Encounter Outcome:  No Answer  Care Coordination Interventions Activated:  No   Care Coordination Interventions:  No, not indicated     [facility] [phone]

## 2022-01-19 ENCOUNTER — Inpatient Hospital Stay: Payer: Medicare Other

## 2022-01-19 ENCOUNTER — Other Ambulatory Visit: Payer: Self-pay

## 2022-01-19 ENCOUNTER — Encounter: Payer: Self-pay | Admitting: Hematology and Oncology

## 2022-01-19 ENCOUNTER — Inpatient Hospital Stay: Payer: Medicare Other | Attending: Hematology and Oncology | Admitting: Hematology and Oncology

## 2022-01-19 VITALS — BP 129/44 | HR 69 | Temp 98.2°F | Resp 16 | Ht 59.0 in | Wt 128.6 lb

## 2022-01-19 DIAGNOSIS — Z79899 Other long term (current) drug therapy: Secondary | ICD-10-CM | POA: Diagnosis not present

## 2022-01-19 DIAGNOSIS — D649 Anemia, unspecified: Secondary | ICD-10-CM | POA: Diagnosis present

## 2022-01-19 DIAGNOSIS — K649 Unspecified hemorrhoids: Secondary | ICD-10-CM | POA: Diagnosis not present

## 2022-01-19 DIAGNOSIS — Z87891 Personal history of nicotine dependence: Secondary | ICD-10-CM | POA: Diagnosis not present

## 2022-01-19 DIAGNOSIS — Z940 Kidney transplant status: Secondary | ICD-10-CM | POA: Insufficient documentation

## 2022-01-19 DIAGNOSIS — R911 Solitary pulmonary nodule: Secondary | ICD-10-CM | POA: Diagnosis not present

## 2022-01-19 DIAGNOSIS — E611 Iron deficiency: Secondary | ICD-10-CM | POA: Diagnosis not present

## 2022-01-19 LAB — FERRITIN: Ferritin: 34 ng/mL (ref 11–307)

## 2022-01-19 LAB — CBC WITH DIFFERENTIAL/PLATELET
Abs Immature Granulocytes: 0.03 10*3/uL (ref 0.00–0.07)
Basophils Absolute: 0 10*3/uL (ref 0.0–0.1)
Basophils Relative: 0 %
Eosinophils Absolute: 0 10*3/uL (ref 0.0–0.5)
Eosinophils Relative: 1 %
HCT: 28.7 % — ABNORMAL LOW (ref 36.0–46.0)
Hemoglobin: 9.2 g/dL — ABNORMAL LOW (ref 12.0–15.0)
Immature Granulocytes: 1 %
Lymphocytes Relative: 14 %
Lymphs Abs: 0.8 10*3/uL (ref 0.7–4.0)
MCH: 32.3 pg (ref 26.0–34.0)
MCHC: 32.1 g/dL (ref 30.0–36.0)
MCV: 100.7 fL — ABNORMAL HIGH (ref 80.0–100.0)
Monocytes Absolute: 0.2 10*3/uL (ref 0.1–1.0)
Monocytes Relative: 4 %
Neutro Abs: 4.5 10*3/uL (ref 1.7–7.7)
Neutrophils Relative %: 80 %
Platelets: 183 10*3/uL (ref 150–400)
RBC: 2.85 MIL/uL — ABNORMAL LOW (ref 3.87–5.11)
RDW: 12.3 % (ref 11.5–15.5)
WBC: 5.6 10*3/uL (ref 4.0–10.5)
nRBC: 0 % (ref 0.0–0.2)

## 2022-01-19 LAB — IRON AND IRON BINDING CAPACITY (CC-WL,HP ONLY)
Iron: 75 ug/dL (ref 28–170)
Saturation Ratios: 23 % (ref 10.4–31.8)
TIBC: 328 ug/dL (ref 250–450)
UIBC: 253 ug/dL (ref 148–442)

## 2022-01-19 NOTE — Progress Notes (Signed)
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  She is here for follow-up.  She today denies any new health complaints although she has reported more frequent hemorrhoidal bleeding.  She has been taking oral iron once a day.  She denies any worsening cough, chest pain or shortness of breath.  She is very worried about the possibility of another lung cancer from the last imaging.  Physical examination today without any concerns.  I have discussed about talking to her primary care doctor once again about the hemorrhoidal bleeding.  We have agreed to repeat labs today.  Labs today shows downtrending hemoglobin and iron panel consistent with decreasing iron stores likely related to the ongoing mild hemorrhoidal bleeding.  With regards to the lung nodule, once again told her that indolent lung cancers can grow very slowly and 3 to 4 months in between imaging can be a reasonable time however she is very concerned about this hence have sent an in basket message to Dr. Isidore Moos.  I also strongly encouraged her to talk to Dr. Isidore Moos about this.  I will ask her to increase oral iron intake to twice a day at this point and she will return to clinic with [place] in about 3 to 4 months for repeat labs.  HISTORY OF PRESENTING ILLNESS:    Anita Pollard 81 y.o. female is here because of anemia.  This is a very pleasant 81 year old female patient with past medical history significant for right-sided breast cancer, cancer of the supraglottis status postradiation, type 2 diabetes, hypertension, kidney transplant referred to hematology for evaluation of slowly progressive anemia.  Since last visit, she tells me that she has been having more hemorrhoidal bleeding.  She wears a napkin and she sees blood stains almost every day.  She has a follow-up appointment with her PCP in January and she is hoping to discuss about her trip once again.  She also tells me that they have found a lung nodule in her left lung but she is not very comfortable with the idea of waiting for so long before we reevaluated.  She tells me that initially they were concerned about lung cancer hence she would rather have a repeat scan sooner.  She is otherwise taking oral iron once a day.  Rest of the pertinent 10 point ROS reviewed and negative.  MEDICAL HISTORY:  Past Medical History:  Diagnosis Date   Alcohol abuse    Anemia    Arthritis    Breast cancer ([place]) 2010   Right   Chronic kidney disease    Colon polyp    Depression    Diabetes mellitus    GERD (gastroesophageal reflux disease)    Heart murmur    History of kidney stones 1976   HTN (hypertension)    Hypothyroidism    Osteopenia    Pancreatitis    Ulcerative esophagitis     SURGICAL HISTORY: Past Surgical  History:  Procedure Laterality Date   ABDOMINAL HYSTERECTOMY     ABDOMINOPLASTY     APPENDECTOMY     BREAST BIOPSY Right 08/03/2008   Stereo Bx, Malignant   BREAST LUMPECTOMY Right 08/26/2008   CATARACT EXTRACTION W/ INTRAOCULAR LENS  IMPLANT, BILATERAL     CO2 LASER APPLICATION N/A 89/38/1017   Procedure: CO2 LASER APPLICATION;  Surgeon: Melida Quitter, MD;  Location: [facility] OR;  Service: ENT;  Laterality: N/A;   COSMETIC SURGERY     ductal carcinoma     right breast    EYE  SURGERY     HIP ARTHROPLASTY Right    INSERTION OF DIALYSIS CATHETER Right 09/25/2012   Procedure: INSERTION OF DIALYSIS CATHETER;  Surgeon: Mal Misty, MD;  Location: [facility] OR;  Service: Vascular;  Laterality: Right;  Righ Internal Jugular Placement   IR GASTROSTOMY TUBE MOD SED  02/10/2020   IR GASTROSTOMY TUBE REMOVAL  05/25/2020   JOINT REPLACEMENT     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   MICROLARYNGOSCOPY N/A 10/20/2019   Procedure: Suspended Microlaryngoscopy with Biopsy;  Surgeon: Melida Quitter, MD;  Location: [facility];  Service: ENT;  Laterality: N/A;   MICROLARYNGOSCOPY N/A 01/09/2020   Procedure: MICROLARYNGOSCOPY WITH BIOPSY;  Surgeon: Melida Quitter, MD;  Location: [facility];  Service: ENT;  Laterality: N/A;   RIGID ESOPHAGOSCOPY N/A 10/20/2019   Procedure: RIGID ESOPHAGOSCOPY;  Surgeon: Melida Quitter, MD;  Location: [facility];  Service: ENT;  Laterality: N/A;   THYROIDECTOMY     TUMMY TUCK      SOCIAL HISTORY: Social History   Socioeconomic History   Marital status: Widowed    Spouse name: Not on file   Number of children: Not on file   Years of education: Not on file   Highest education level: Not on file  Occupational History   Not on file  Tobacco Use   Smoking status: Former    Types: Cigarettes    Quit date: 09/25/1983    Years since quitting: 38.3   Smokeless tobacco: Never  Vaping Use   Vaping Use: Never used  Substance and Sexual Activity   Alcohol use: Yes    Comment: socially history of heavy drinking with intervention x 2-at least 2 occasions since april '14   Drug use: No   Sexual activity: Never  Other Topics Concern   Not on file  Social History Narrative   Not on file   Social Determinants of Health   Financial Resource Strain: Low Risk  (01/22/2020)   Overall Financial Resource Strain (CARDIA)    Difficulty of Paying Living Expenses: Not hard at all  Food Insecurity: No Food Insecurity (01/22/2020)   Hunger Vital Sign    Worried About Running Out of  Food in the Last Year: Never true    [place] in the Last Year: Never true  Transportation Needs: No Transportation Needs (01/22/2020)   PRAPARE - Hydrologist (Medical): No    Lack of Transportation (Non-Medical): No  Physical Activity: Insufficiently Active (01/22/2020)   Exercise Vital Sign    Days of Exercise per Week: 5 days    Minutes of Exercise per Session: 20 min  Stress: Not on file  Social Connections: Moderately Isolated (01/22/2020)   Social Connection and Isolation Panel [NHANES]    Frequency of Communication with Friends and Family: More than three times a week    Frequency of Social Gatherings with Friends and Family: Never  Attends Religious Services: Never    Active Member of Clubs or Organizations: Yes    Attends Archivist Meetings: Never    Marital Status: Widowed  Human resources officer Violence: Not on file    FAMILY HISTORY: Family History  Problem Relation Age of Onset   Hypertension Mother    Diabetes Father    Hypertension Father    Hypertension Sister    Dementia Sister    Multiple myeloma Sister    Thyroid cancer Daughter 52   Diabetes Brother    Colon polyps Brother    Atrial fibrillation Brother    Breast cancer Neg Hx     ALLERGIES:  is allergic to penicillins, banana, detrol [tolterodine], quetiapine, risperidone and related, and lisinopril.  MEDICATIONS:  Current Outpatient Medications  Medication Sig Dispense Refill   amLODipine (NORVASC) 10 MG tablet Take 10 mg by mouth at bedtime.     Cholecalciferol (VITAMIN D3) 5000 UNITS TABS Take 5,000 Units by mouth 3 (three) times daily after meals.      ferrous sulfate 324 MG TBEC Take 324 mg by mouth.     furosemide (LASIX) 40 MG tablet Take 1 tablet (40 mg total) by mouth daily. 90 tablet 3   glipiZIDE (GLUCOTROL XL) 2.5 MG 24 hr tablet Take 5 mg by mouth 2 (two) times daily.      insulin glargine, 1 Unit Dial, (TOUJEO SOLOSTAR) 300 UNIT/ML Solostar  Pen 5 Units.     Lancets (FREESTYLE) lancets by Does not apply route.     levothyroxine (SYNTHROID) 200 MCG tablet Take 200 mcg by mouth daily before breakfast.      losartan (COZAAR) 25 MG tablet Take 25 mg by mouth at bedtime.      Melatonin 5 MG CAPS Take 5 mg by mouth See admin instructions. Take 5 mg 2 hours before bedtime     nitrofurantoin, macrocrystal-monohydrate, (MACROBID) 100 MG capsule Take 100 mg by mouth 2 (two) times daily.     omeprazole (PRILOSEC) 20 MG capsule Take 20 mg by mouth daily.     Polyethyl Glycol-Propyl Glycol (SYSTANE OP) Place 1 drop into both eyes every evening.     pramipexole (MIRAPEX) 0.25 MG tablet Take 0.25-0.5 mg by mouth See admin instructions. Take 0.5 mg 2 hours prior to bedtime then 0.25 mg at bedtime     predniSONE (DELTASONE) 10 MG tablet Take 10 mg by mouth daily with breakfast.      raloxifene (EVISTA) 60 MG tablet Take 60 mg by mouth daily.     rosuvastatin (CRESTOR) 5 MG tablet Take 1 tablet (5 mg total) by mouth 2 (two) times a week. Take on Mondays and Thursdays. 30 tablet 3   tacrolimus (PROGRAF) 0.5 MG capsule Take 2 mg by mouth every 12 (twelve) hours.     temazepam (RESTORIL) 15 MG capsule Take 15-30 mg by mouth at bedtime.      vitamin B-12 (CYANOCOBALAMIN) 500 MCG tablet Take 500 mcg by mouth daily.     No current facility-administered medications for this visit.   PHYSICAL EXAMINATION:  ECOG PERFORMANCE STATUS: 0 - Asymptomatic  Vitals:   01/19/22 1311  BP: (!) 129/44  Pulse: 69  Resp: 16  Temp: 98.2 F (36.8 C)  SpO2: 96%    Physical Exam Constitutional:      Appearance: Normal appearance.  Cardiovascular:     Rate and Rhythm: Normal rate and regular rhythm.     Pulses: Normal pulses.     Heart sounds: Normal  heart sounds.  Pulmonary:     Effort: Pulmonary effort is normal.     Breath sounds: Normal breath sounds.  Abdominal:     General: Abdomen is flat. Bowel sounds are normal. There is no distension.      Palpations: Abdomen is soft. There is no mass.  Musculoskeletal:        General: No swelling or tenderness.     Cervical back: Normal range of motion and neck supple.  Neurological:     Mental Status: She is alert.  Psychiatric:        Mood and Affect: Mood normal.     LABORATORY DATA:  I have reviewed the data as listed Lab Results  Component Value Date   WBC 6.0 07/21/2021   HGB 10.0 (L) 07/21/2021   HCT 30.2 (L) 07/21/2021   MCV 97.4 07/21/2021   PLT 187 07/21/2021     Chemistry      Component Value Date/Time   NA 139 09/28/2020 1432   NA 137 03/22/2020 1439   NA 140 08/28/2013 0955   K 4.0 09/28/2020 1432   K 3.0 (LL) 08/28/2013 0955   CL 106 09/28/2020 1432   CL 111 (H) 09/03/2012 1249   CO2 22 09/28/2020 1432   CO2 16 (L) 08/28/2013 0955   BUN 43 (H) 09/28/2020 1432   BUN 42 (H) 03/22/2020 1439   BUN 75.7 (H) 08/28/2013 0955   CREATININE 1.67 (H) 09/28/2020 1432   CREATININE 5.9 (HH) 08/28/2013 0955      Component Value Date/Time   CALCIUM 9.0 09/28/2020 1432   CALCIUM 7.5 (L) 08/28/2013 0955   ALKPHOS 111 09/28/2020 1432   ALKPHOS 62 08/28/2013 0955   AST 15 09/28/2020 1432   AST 15 08/28/2013 0955   ALT 15 09/28/2020 1432   ALT 17 08/28/2013 0955   BILITOT 0.3 09/28/2020 1432   BILITOT 0.38 08/28/2013 0955     Reviewed labs from [place] done on December 14.  Her white blood cell count of 5.7, hemoglobin is 10.3, platelet count of 210,000, absolute lymphocytes were low and absolute eosinophils were low, otherwise iron panel showed a ferritin of 46 and total iron binding capacity of 326, iron saturation of 25%.   07/21/2021, CBC showed white blood cell count of 6000, hemoglobin of 10, hematocrit of 30.2 and platelet count of 187,000. Iron and iron binding panel normal.  B12 is normal.  01/19/2022 CBC showed hemoglobin of 9.2 g/dL, total white blood cell count of 5.6 and platelet count of 183,000.  Iron panel with increasing total iron binding capacity  suggestive of iron deficiency, ferritin pending  RADIOGRAPHIC STUDIES: I have personally reviewed the radiological images as listed and agreed with the findings in the report. No results found.  I spent 30 minutes in the care of this patient including history, review of records, discussion of labs and follow-up recommendations.    Benay Pike, MD 01/19/2022 1:27 PM

## 2022-01-20 ENCOUNTER — Telehealth: Payer: Self-pay | Admitting: *Deleted

## 2022-01-20 LAB — TSH: TSH: 0.034 u[IU]/mL — ABNORMAL LOW (ref 0.350–4.500)

## 2022-01-20 NOTE — Telephone Encounter (Signed)
CALLED PATIENT TO INFORM OF CT FOR 01-24-22- ARRIVAL TIME- 7:50 AM @ [facility] [address] NO RESTRICTIONS TO TEST, PATIENT TO RECEIVE RESULTS FROM DR. SQUIRE ON 01-27-22 @ 2:20 PM, SPOKE WITH PATIENT AND SHE IS AWARE OF THESE APPTS. AND THE INSTRUCTIONS

## 2022-01-24 ENCOUNTER — Ambulatory Visit
Admission: RE | Admit: 2022-01-24 | Discharge: 2022-01-24 | Disposition: A | Discharge status: Home / Self Care | Payer: Medicare Other | Source: Ambulatory Visit | Attending: Radiation Oncology | Admitting: Radiation Oncology

## 2022-01-24 DIAGNOSIS — C3432 Malignant neoplasm of lower lobe, left bronchus or lung: Secondary | ICD-10-CM

## 2022-01-25 NOTE — Progress Notes (Signed)
Mrs. Radman presents today for follow-up after completing radiation to her supraglottis on 03/31/2020 and left lower lung on 01/14/2021  Pain issues, if any: right leg pain Using a feeding tube?: no Weight changes, if any: no weight loss Swallowing issues, if any: yes, swallowing not back to normal-still using thickener, issues with post nasal drip Smoking or chewing tobacco? no Using fluoride trays daily? no Last ENT visit was on: supposed to see in July, but saw Dr. Isidore Moos Other notable issues, if any: no other issues or concerns  Vitals:   01/27/22 1423  BP: (!) 159/53  Pulse: 77  Resp: 20  Temp: 97.8 F (36.6 C)  SpO2: 96%

## 2022-01-27 ENCOUNTER — Encounter: Payer: Self-pay | Admitting: Radiation Oncology

## 2022-01-27 ENCOUNTER — Ambulatory Visit
Admission: RE | Admit: 2022-01-27 | Discharge: 2022-01-27 | Disposition: A | Discharge status: Home / Self Care | Payer: Medicare Other | Source: Ambulatory Visit | Attending: Radiation Oncology | Admitting: Radiation Oncology

## 2022-01-27 VITALS — BP 159/53 | HR 77 | Temp 97.8°F | Resp 20 | Ht 59.0 in | Wt 129.0 lb

## 2022-01-27 DIAGNOSIS — E039 Hypothyroidism, unspecified: Secondary | ICD-10-CM | POA: Insufficient documentation

## 2022-01-27 DIAGNOSIS — R918 Other nonspecific abnormal finding of lung field: Secondary | ICD-10-CM | POA: Insufficient documentation

## 2022-01-27 DIAGNOSIS — R600 Localized edema: Secondary | ICD-10-CM | POA: Diagnosis not present

## 2022-01-27 DIAGNOSIS — Z923 Personal history of irradiation: Secondary | ICD-10-CM | POA: Diagnosis not present

## 2022-01-27 DIAGNOSIS — Z79890 Hormone replacement therapy (postmenopausal): Secondary | ICD-10-CM | POA: Insufficient documentation

## 2022-01-27 DIAGNOSIS — C321 Malignant neoplasm of supraglottis: Secondary | ICD-10-CM | POA: Diagnosis present

## 2022-01-27 DIAGNOSIS — C3432 Malignant neoplasm of lower lobe, left bronchus or lung: Secondary | ICD-10-CM | POA: Diagnosis not present

## 2022-01-27 DIAGNOSIS — R1310 Dysphagia, unspecified: Secondary | ICD-10-CM | POA: Insufficient documentation

## 2022-01-27 MED ORDER — OXYMETAZOLINE HCL 0.05 % NA SOLN
1.0000 | Freq: Two times a day (BID) | NASAL | Status: DC
  Administered 2022-01-27: 1 via NASAL
  Filled 2022-01-27: qty 30

## 2022-01-27 NOTE — Progress Notes (Addendum)
Radiation Oncology         [phone] ________________________________  Name: ONEAL BIGLOW MRN: [id number]  Date: 01/27/2022  DOB: [birth year]  Follow-Up Visit Note   OUTPATIENT, in person   CC: Lawerance Cruel, MD  Melida Quitter, MD  Diagnosis and Prior Radiotherapy:       ICD-10-CM   1. Squamous cell carcinoma of epiglottis (HCC)  C32.1 oxymetazoline (AFRIN) 0.05 % nasal spray 1 spray    CT Chest Wo Contrast    2. 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 Lower extremity edema  R60.0 VAS [place] LOWER EXTREMITY VENOUS (DVT)    4. 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([place]) Staging form: Larynx - Supraglottis, AJCC 8th Edition - Clinical stage from 01/20/2020: Stage II (cT2, cN0, cM0) - Signed by Eppie Gibson, MD on 06/22/2020 Stage prefix: Initial diagnosis   Radiation Treatment Dates: 02/09/2020 through 03/31/2020 Site Technique Total Dose (Gy) Dose per Fx (Gy) Completed Fx Beam Energies  Larynx: HN_supragl IMRT 70/70 2 35/35 6X    Radiation Treatment Dates: 01/04/2021 through 01/14/2021 Site Technique Total Dose (Gy) Dose per Fx (Gy) Completed Fx Beam Energies  Lung, Left: Lung_Lt_lower IMRT 60/60 12 5/5 6XFFF   CHIEF COMPLAINT:  Here for follow-up and surveillance of lung/ throat cancer  Narrative:    Mrs. Wease presents today for follow-up after completing radiation to her supraglottis on 03/31/2020 and left lower lung on 01/14/2021  Pain issues, if any: right leg pain in inner right thigh.  She has concerns about a possible blood clot. Using a feeding tube?: no Weight changes, if any: no weight loss Swallowing issues, if any: yes, swallowing not back to normal-still using thickener, issues with post nasal drip Smoking or chewing tobacco? no Using fluoride trays daily? no Last ENT visit was on: supposed to see in July, but saw Dr. Isidore Moos Other  notable issues, if any: She continues to cope with anemia and other medical issues and continues to follow with multiple subspecialist.  Her thyroid medication management is followed by her primary doctor and endocrinologist.  I was notified by Dr. Chryl Heck that TSH was recently abnormal in medical oncology  Vitals:   01/27/22 1423  BP: (!) 159/53  Pulse: 77  Resp: 20  Temp: 97.8 F (36.6 C)  SpO2: 96%    ALLERGIES:  is allergic to penicillins, banana, detrol [tolterodine], quetiapine, risperidone and related, and lisinopril.  Meds: Current Outpatient Medications  Medication Sig Dispense Refill   amLODipine (NORVASC) 5 MG tablet Take 5 mg by mouth at bedtime.     carvedilol (COREG) 3.125 MG tablet Take 3.12 mg by mouth 2 (two) times daily with a meal.     Cholecalciferol (VITAMIN D3) 5000 UNITS TABS Take 5,000 Units by mouth 3 (three) times daily after meals.      ferrous sulfate 324 MG TBEC Take 324 mg by mouth 2 (two) times daily. Takes     furosemide (LASIX) 40 MG tablet Take 1 tablet (40 mg total) by mouth daily. 90 tablet 3   glipiZIDE (GLUCOTROL XL) 2.5 MG 24 hr tablet Take 5 mg by mouth 2 (two) times daily.      insulin glargine, 1 Unit Dial, (TOUJEO SOLOSTAR) 300 UNIT/ML Solostar Pen 5 Units.     Lancets (FREESTYLE) lancets by Does not apply route.  levothyroxine (SYNTHROID) 200 MCG tablet Take 200 mcg by mouth daily before breakfast.      losartan (COZAAR) 50 MG tablet Take 50 mg by mouth daily.     Melatonin 5 MG CAPS Take 5 mg by mouth See admin instructions. Take 5 mg 2 hours before bedtime     Multiple Vitamins-Minerals (ICAPS AREDS 2 PO) Take 1 tablet by mouth 2 (two) times daily.     omeprazole (PRILOSEC) 20 MG capsule Take 20 mg by mouth daily.     Polyethyl Glycol-Propyl Glycol (SYSTANE OP) Place 1 drop into both eyes every evening.     pramipexole (MIRAPEX) 0.25 MG tablet Take 0.25-0.5 mg by mouth See admin instructions. Take 0.5 mg 2 hours prior to bedtime then 0.25  mg at bedtime     predniSONE (DELTASONE) 10 MG tablet Take 10 mg by mouth daily with breakfast.      raloxifene (EVISTA) 60 MG tablet Take 60 mg by mouth daily.     rosuvastatin (CRESTOR) 5 MG tablet Take 1 tablet (5 mg total) by mouth 2 (two) times a week. Take on Mondays and Thursdays. 30 tablet 3   tacrolimus (PROGRAF) 0.5 MG capsule Take 2 mg by mouth every 12 (twelve) hours.     temazepam (RESTORIL) 15 MG capsule Take 15-30 mg by mouth at bedtime.      vitamin B-12 (CYANOCOBALAMIN) 500 MCG tablet Take 500 mcg by mouth daily.     Current Facility-Administered Medications  Medication Dose Route Frequency Provider Last Rate Last Admin   oxymetazoline (AFRIN) 0.05 % nasal spray 1 spray  1 spray Each Nare BID Eppie Gibson, MD   1 spray at 01/27/22 1546    Physical Findings: The patient is in no acute distress. Patient is alert and oriented. Wt Readings from Last 3 Encounters:  01/27/22 129 lb (58.5 kg)  01/19/22 128 lb 9.6 oz (58.3 kg)  10/25/21 124 lb (56.2 kg)    height is 4\' 11"  (1.499 m) and weight is 129 lb (58.5 kg). Her temperature is 97.8 F (36.6 C). Her blood pressure is 159/53 (abnormal) and her pulse is 77. Her respiration is 20 and oxygen saturation is 96%. .  General: Alert and oriented, in no acute distress with mild stable hoarseness HEENT: Head is normocephalic. Extraocular movements are intact. Oropharynx is clear. Neck: Neck is supple, no palpable cervical or supraclavicular lymphadenopathy. Heart: Regular in rate and rhythm with no murmurs, rubs, or gallops. Chest: Clear to auscultation bilaterally, with no rhonchi, wheezes, or rales. Abd: Former PEG site shows puckering in skin/ subcutaneous tissue without evidence of fistula or drainage Lymphatics: see Neck Exam Extremities: Pitting lower extremity edema bilaterally.  Tenderness to palpation in the medial right thigh Musculoskeletal: symmetric strength and muscle tone throughout. Neurologic: Cranial nerves II  through XII are grossly intact. No obvious focalities. Speech is fluent. Coordination is intact. Psychiatric: Judgment and insight are intact. Affect is appropriate.   PROCEDURE NOTE: After obtaining consent and spraying nasal cavity with topical oxymetazoline, the flexible endoscope was coated w/ lidocaine gel and introduced and passed through the nasal cavity.  The nasopharynx, oropharynx, hypopharynx, and larynx  were then examined. No lesions appreciated in the mucosal axis. The true cords were symmetrically mobile. She tolerated this well.   Lab Findings: Lab Results  Component Value Date   WBC 5.6 01/19/2022   HGB 9.2 (L) 01/19/2022   HCT 28.7 (L) 01/19/2022   MCV 100.7 (H) 01/19/2022   PLT 183 01/19/2022  Lab Results  Component Value Date   TSH 0.034 (L) 01/19/2022    Radiographic Findings: CT Chest Wo Contrast  Result Date: 01/25/2022 CLINICAL DATA:  Non-small-cell lung cancer of left lower lobe. Radiation therapy. History of breast cancer with lumpectomy. History of head neck cancer. Ex-smoker. Hypertension. Diabetes. * Tracking Code: BO * EXAM: CT CHEST WITHOUT CONTRAST TECHNIQUE: Multidetector CT imaging of the chest was performed following the standard protocol without IV contrast. RADIATION DOSE REDUCTION: This exam was performed according to the departmental dose-optimization program which includes automated exposure control, adjustment of the mA and/or kV according to patient size and/or use of iterative reconstruction technique. COMPARISON:  10/21/2021 FINDINGS: Cardiovascular: Aortic atherosclerosis. Tortuous thoracic aorta. Mild cardiomegaly, without pericardial effusion. Three vessel coronary artery calcification. Mediastinum/Nodes: Thyroidectomy. No supraclavicular adenopathy. No mediastinal or hilar adenopathy, given limitations of unenhanced CT. Lungs/Pleura: No pleural fluid. Mild centrilobular emphysema. Posterior right upper lobe mixed attenuation nodule measures  1.7 cm and 41/5 and is similar to 1.8 cm on the prior exam (when remeasured). Suspect a solid component of on the order of 6 mm on 42/5, similar. Inferior right upper lobe calcified granuloma. Subpleural inferior right upper lobe 4 mm nodule on 79/5 is unchanged. Similar appearance of left lower lobe airspace disease, without well-defined residual nodule. Upper Abdomen: Normal imaged portions of the liver, gallbladder, right adrenal gland. Minimal low-density left adrenal nodularity up to 1.4 cm is consistent with an adenoma 128/2. Bilateral renal cortical thinning with renal vascular calcifications. Left nephrolithiasis, including a 1.4 cm stone at the ureteropelvic junction, similar. No significant hydronephrosis. Chronic calcific pancreatitis. Superior splenic hypoattenuating 1.0 cm lesion is similar to the prior and of doubtful clinical significance. The stomach appears thick walled, but is underdistended. Skin defect just superficial to an area of apparent gastric wall thickening including 146/2, similar. Musculoskeletal: Osteopenia. Right glenohumeral joint osteoarthritis. Remote right rib fractures. IMPRESSION: 1. Similar appearance of left lower lobe radiation induced consolidation, without well-defined residual nodule. 2. Similar right upper lobe mixed attenuation pulmonary nodule for which low-grade adenocarcinoma remains a concern. Recommend attention on follow-up. 3. No thoracic adenopathy or evidence of metastatic disease. 4. Inferior right upper lobe subpleural 4 mm solid nodule is most likely a subpleural lymph node and can be re-evaluated on follow-up. 5. Left adrenal adenoma . In the absence of clinically indicated signs/symptoms require(s) no independent follow-up. 6. Bilateral renal atrophy with left nephrolithiasis, including a similar nonobstructive stone of 1.4 cm at the ureteropelvic junction. 7. Chronic calcific pancreatitis 8. Apparent gastric wall thickening, possibly related to  underdistention. Cannot exclude gastritis or other gastric pathology. The gastric wall is immediately subjacent to a skin defect within the anterior paramidline abdominal wall which is similar and given history of head and neck cancer likely represents the site of the prior gastrostomy tube. Recommend clinical exclusion of gastr- cutaneous fistula. 9. Aortic atherosclerosis (ICD10-I70.0), coronary artery atherosclerosis and emphysema (ICD10-J43.9). Electronically Signed   By: Abigail Miyamoto M.D.   On: 01/25/2022 15:40     Impression/Plan:    1) Head and Neck Cancer / lung cancer Status: I personally reviewed her chest imaging  - CT imaging continues to show likely scar tissue from therapy in the left lower lung and there is still a groundglass opacity in the right upper lung that could be a slow-growing cancer versus benign etiology.   She and I agree to proceed with repeat imaging in 6 months for surveillance and follow-up with me in person at that time  so that she can also have a thorough head and neck exam.    2) Nutritional Status: Stable  Wt Readings from Last 3 Encounters:  01/27/22 129 lb (58.5 kg)  01/19/22 128 lb 9.6 oz (58.3 kg)  10/25/21 124 lb (56.2 kg)    3) Risk Factors: The patient has been educated about risk factors including alcohol and tobacco abuse; they understand that avoidance of alcohol and tobacco is important to prevent recurrences as well as other cancers  4) Swallowing:   Continue swallowing exercises per SLP and thickeners as needed  5) Dental: Encouraged to continue regular followup with dentistry, and dental hygiene including fluoride rinses.   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  She will continue supplementation and labs per her PCP/endocrinologist's instructions.   She is aware of her recent abnormal TSH in the cancer center from her appointment with medical oncology.  She will discuss this with  her PCP and endocrinologist. Lab Results  Component Value Date   TSH 0.034 (L) 01/19/2022      7) Patient will be referred back to ENT for laryngoscopy in 3 months, I will perform laryngoscopy in 6 months.  8) Ordering Doppler ultrasound of right leg to rule out DVT given recent symptoms  On date of service I spent 30 minutes on this encounter.  Patient was seen face-to-face.     Eppie Gibson, MD

## 2022-01-30 ENCOUNTER — Telehealth: Payer: Self-pay | Admitting: *Deleted

## 2022-01-30 NOTE — Progress Notes (Signed)
Oncology Nurse Navigator Documentation   Per patient's 01/27/22 post-treatment follow-up with Dr. Isidore Moos, sent fax to [facility] ENT Scheduling with request Ms. Meir be contacted and scheduled for routine post-RT follow-up with Dr. Redmond Baseman in 3 months.  Notification of successful fax transmission received.   Harlow Asa RN, BSN, OCN Head & Neck Oncology Nurse [place] at [facility] Phone # [phone]  Fax # [phone]

## 2022-01-30 NOTE — Telephone Encounter (Signed)
CALLED PATIENT TO INFORM OF DOPPLER FOR 02-02-22, ARRIVAL TIME- 10:30 AM @ [facility], NO RESTRICTIONS TO TEST, SPOKE WITH PATIENT AND SHE IS AWARE OF THIS SCAN AND THE INSTRUCTIONS

## 2022-01-31 ENCOUNTER — Encounter (HOSPITAL_COMMUNITY): Payer: Medicare Other

## 2022-02-02 ENCOUNTER — Ambulatory Visit (HOSPITAL_COMMUNITY)
Admission: RE | Admit: 2022-02-02 | Discharge: 2022-02-02 | Disposition: A | Discharge status: Home / Self Care | Payer: Medicare Other | Source: Ambulatory Visit | Attending: Radiation Oncology | Admitting: Radiation Oncology

## 2022-02-02 DIAGNOSIS — R600 Localized edema: Secondary | ICD-10-CM | POA: Diagnosis present

## 2022-02-06 ENCOUNTER — Telehealth: Payer: Self-pay

## 2022-02-06 NOTE — Telephone Encounter (Signed)
RN Anderson Malta called pt to inform her of vascular study results (negative for dvt). Pt was doing well with no needs and thankful for a great follow up call.

## 2022-02-28 ENCOUNTER — Ambulatory Visit: Payer: Self-pay | Admitting: Radiation Oncology

## 2022-03-06 ENCOUNTER — Other Ambulatory Visit: Payer: Self-pay

## 2022-03-06 ENCOUNTER — Encounter (HOSPITAL_BASED_OUTPATIENT_CLINIC_OR_DEPARTMENT_OTHER): Payer: Self-pay | Admitting: Emergency Medicine

## 2022-03-06 ENCOUNTER — Emergency Department (HOSPITAL_BASED_OUTPATIENT_CLINIC_OR_DEPARTMENT_OTHER)
Admission: EM | Admit: 2022-03-06 | Discharge: 2022-03-06 | Disposition: A | Discharge status: Home / Self Care | Payer: Medicare Other | Attending: Emergency Medicine | Admitting: Emergency Medicine

## 2022-03-06 ENCOUNTER — Emergency Department (HOSPITAL_BASED_OUTPATIENT_CLINIC_OR_DEPARTMENT_OTHER): Payer: Medicare Other | Admitting: Radiology

## 2022-03-06 DIAGNOSIS — Z794 Long term (current) use of insulin: Secondary | ICD-10-CM | POA: Diagnosis not present

## 2022-03-06 DIAGNOSIS — Z7984 Long term (current) use of oral hypoglycemic drugs: Secondary | ICD-10-CM | POA: Diagnosis not present

## 2022-03-06 DIAGNOSIS — E875 Hyperkalemia: Secondary | ICD-10-CM | POA: Diagnosis not present

## 2022-03-06 DIAGNOSIS — E039 Hypothyroidism, unspecified: Secondary | ICD-10-CM | POA: Diagnosis not present

## 2022-03-06 DIAGNOSIS — Z79899 Other long term (current) drug therapy: Secondary | ICD-10-CM | POA: Insufficient documentation

## 2022-03-06 DIAGNOSIS — Z1152 Encounter for screening for COVID-19: Secondary | ICD-10-CM | POA: Diagnosis not present

## 2022-03-06 DIAGNOSIS — J101 Influenza due to other identified influenza virus with other respiratory manifestations: Secondary | ICD-10-CM | POA: Insufficient documentation

## 2022-03-06 DIAGNOSIS — E1122 Type 2 diabetes mellitus with diabetic chronic kidney disease: Secondary | ICD-10-CM | POA: Diagnosis not present

## 2022-03-06 DIAGNOSIS — N189 Chronic kidney disease, unspecified: Secondary | ICD-10-CM | POA: Insufficient documentation

## 2022-03-06 DIAGNOSIS — I129 Hypertensive chronic kidney disease with stage 1 through stage 4 chronic kidney disease, or unspecified chronic kidney disease: Secondary | ICD-10-CM | POA: Insufficient documentation

## 2022-03-06 DIAGNOSIS — R059 Cough, unspecified: Secondary | ICD-10-CM | POA: Diagnosis present

## 2022-03-06 LAB — BASIC METABOLIC PANEL
Anion gap: 7 (ref 5–15)
BUN: 46 mg/dL — ABNORMAL HIGH (ref 8–23)
CO2: 28 mmol/L (ref 22–32)
Calcium: 8.1 mg/dL — ABNORMAL LOW (ref 8.9–10.3)
Chloride: 100 mmol/L (ref 98–111)
Creatinine, Ser: 2.33 mg/dL — ABNORMAL HIGH (ref 0.44–1.00)
GFR, Estimated: 20 mL/min — ABNORMAL LOW (ref >60)
Glucose, Bld: 283 mg/dL — ABNORMAL HIGH (ref 70–99)
Potassium: 5.3 mmol/L — ABNORMAL HIGH (ref 3.5–5.1)
Sodium: 135 mmol/L (ref 135–145)

## 2022-03-06 LAB — CBC
HCT: 27 % — ABNORMAL LOW (ref 36.0–46.0)
Hemoglobin: 8.6 g/dL — ABNORMAL LOW (ref 12.0–15.0)
MCH: 32.7 pg (ref 26.0–34.0)
MCHC: 31.9 g/dL (ref 30.0–36.0)
MCV: 102.7 fL — ABNORMAL HIGH (ref 80.0–100.0)
Platelets: 129 10*3/uL — ABNORMAL LOW (ref 150–400)
RBC: 2.63 MIL/uL — ABNORMAL LOW (ref 3.87–5.11)
RDW: 14 % (ref 11.5–15.5)
WBC: 6.8 10*3/uL (ref 4.0–10.5)
nRBC: 0 % (ref 0.0–0.2)

## 2022-03-06 LAB — RESP PANEL BY RT-PCR (RSV, FLU A&B, COVID)  RVPGX2
Influenza A by PCR: POSITIVE — AB
Influenza B by PCR: NEGATIVE
Resp Syncytial Virus by PCR: NEGATIVE
SARS Coronavirus 2 by RT PCR: NEGATIVE

## 2022-03-06 MED ORDER — ONDANSETRON HCL 4 MG PO TABS: 4.0000 mg | ORAL_TABLET | Freq: Four times a day (QID) | ORAL | 0 refills | Status: DC | PRN

## 2022-03-06 MED ORDER — SODIUM CHLORIDE 0.9 % IV BOLUS
1000.0000 mL | Freq: Once | INTRAVENOUS | Status: AC
  Administered 2022-03-06: 1000 mL via INTRAVENOUS

## 2022-03-06 NOTE — Discharge Instructions (Addendum)
Please return to the ED with any new or worsening signs or symptoms which is continued nausea and vomiting, continued diarrhea, lightheadedness, dizziness, weakness Please follow-up with your PCP in the next couple of days for lab recheck Please treat symptoms at home conservatively.  Please take Tylenol for headaches and fevers and pains.  Please push fluids such as water as your potassium was shown to be higher today.  Please eat a high protein low-fat diet. Please read attached guide concerning influenza Please discontinue use of doxycycline if you have not done so already

## 2022-03-06 NOTE — ED Notes (Signed)
During IV attempt on the left AC, IV was started in the artery on accident... IV was removed immediately... Provider aware and evaluated.

## 2022-03-06 NOTE — ED Triage Notes (Signed)
Pt was placed on antibiotic last week for URI, placed on doxy, started having diarrhea, nausea and vomiting, not keeping much down.

## 2022-03-06 NOTE — ED Provider Notes (Signed)
[facility] EMERGENCY DEPT Provider Note   CSN: [id number] Arrival date & time: 03/06/22  1405     History No chief complaint on file.   Anita Pollard is a 81 y.o. female with medical history of alcohol abuse, anemia, arthritis, CKD, depression, diabetes, GERD, hypertension, hypothyroid, pancreatitis.  Patient presents to the ED for evaluation of "needing IV fluids".  Patient reports that she began developing a productive cough last Monday and was seen by her PCP.  Patient reports that at that time her PCP heard "no breath sounds" and as result decided to put her on doxycycline for suspected CAP.  Patient reports that over the course of the last 1 week she has had increased nausea, vomiting and diarrhea.  Patient reports that her last episode of nausea and vomiting was on Saturday and last episode of diarrhea was last night.  Patient reports that she advised her PCP of this and he advised her to report to ED for evaluation and needing IV fluids.  Patient denies any fevers, abdominal pain, chest pain, shortness of breath, sore throat, lightheadedness, dizziness or weakness.  HPI     Home Medications Prior to Admission medications   Medication Sig Start Date End Date Taking? Authorizing Provider  ondansetron (ZOFRAN) 4 MG tablet Take 1 tablet (4 mg total) by mouth every 6 (six) hours as needed for nausea or vomiting. 03/06/22  Yes Azucena Cecil, PA-C  amLODipine (NORVASC) 5 MG tablet Take 5 mg by mouth at bedtime.    [provider]  carvedilol (COREG) 3.125 MG tablet Take 3.12 mg by mouth 2 (two) times daily with a meal.    [provider]  Cholecalciferol (VITAMIN D3) 5000 UNITS TABS Take 5,000 Units by mouth 3 (three) times daily after meals.     [provider]  ferrous sulfate 324 MG TBEC Take 324 mg by mouth 2 (two) times daily. Takes    [provider]  furosemide (LASIX) 40 MG tablet Take 1 tablet (40 mg total) by mouth  daily. 02/09/21   Jerline Pain, MD  glipiZIDE (GLUCOTROL XL) 2.5 MG 24 hr tablet Take 5 mg by mouth 2 (two) times daily.  01/01/14   [provider]  insulin glargine, 1 Unit Dial, (TOUJEO SOLOSTAR) 300 UNIT/ML Solostar Pen 5 Units. 09/02/20   [provider]  Lancets (FREESTYLE) lancets by Does not apply route.    [provider]  levothyroxine (SYNTHROID) 200 MCG tablet Take 200 mcg by mouth daily before breakfast.     [provider]  losartan (COZAAR) 50 MG tablet Take 50 mg by mouth daily.    [provider]  Melatonin 5 MG CAPS Take 5 mg by mouth See admin instructions. Take 5 mg 2 hours before bedtime    [provider]  Multiple Vitamins-Minerals (ICAPS AREDS 2 PO) Take 1 tablet by mouth 2 (two) times daily.    [provider]  omeprazole (PRILOSEC) 20 MG capsule Take 20 mg by mouth daily.    [provider]  Polyethyl Glycol-Propyl Glycol (SYSTANE OP) Place 1 drop into both eyes every evening.    [provider]  pramipexole (MIRAPEX) 0.25 MG tablet Take 0.25-0.5 mg by mouth See admin instructions. Take 0.5 mg 2 hours prior to bedtime then 0.25 mg at bedtime    [provider]  predniSONE (DELTASONE) 10 MG tablet Take 10 mg by mouth daily with breakfast.     [provider]  raloxifene (EVISTA)  60 MG tablet Take 60 mg by mouth daily.    [provider]  rosuvastatin (CRESTOR) 5 MG tablet Take 1 tablet (5 mg total) by mouth 2 (two) times a week. Take on Mondays and Thursdays. 02/10/21   Jerline Pain, MD  tacrolimus (PROGRAF) 0.5 MG capsule Take 2 mg by mouth every 12 (twelve) hours.    [provider]  temazepam (RESTORIL) 15 MG capsule Take 15-30 mg by mouth at bedtime.  05/20/13   [provider]  vitamin B-12 (CYANOCOBALAMIN) 500 MCG tablet Take 500 mcg by mouth daily.    [provider]      Allergies    Penicillins, Banana, Detrol [tolterodine],  Quetiapine, Risperidone and related, and Lisinopril    Review of Systems   Review of Systems  Constitutional:  Negative for fever.  HENT:  Negative for sore throat.   Respiratory:  Negative for shortness of breath.   Cardiovascular:  Negative for chest pain.  Gastrointestinal:  Positive for diarrhea, nausea and vomiting. Negative for abdominal pain.  Neurological:  Negative for dizziness, weakness and light-headedness.  All other systems reviewed and are negative.   Physical Exam Updated Vital Signs BP 138/76   Pulse 61   Temp 98.6 F (37 C)   Resp (!) 21   SpO2 94%  Physical Exam Vitals and nursing note reviewed.  Constitutional:      General: She is not in acute distress.    Appearance: She is well-developed.  HENT:     Head: Normocephalic and atraumatic.     Nose: Nose normal. No congestion.     Mouth/Throat:     Mouth: Mucous membranes are dry.     Pharynx: Oropharynx is clear. No oropharyngeal exudate or posterior oropharyngeal erythema.  Eyes:     Conjunctiva/sclera: Conjunctivae normal.  Cardiovascular:     Rate and Rhythm: Normal rate and regular rhythm.     Heart sounds: No murmur heard. Pulmonary:     Effort: Pulmonary effort is normal. No respiratory distress.     Breath sounds: Normal breath sounds.  Abdominal:     General: Abdomen is flat. Bowel sounds are normal.     Palpations: Abdomen is soft.     Tenderness: There is no abdominal tenderness.  Musculoskeletal:        General: No swelling.     Cervical back: Normal range of motion and neck supple. No rigidity.  Skin:    General: Skin is warm and dry.     Capillary Refill: Capillary refill takes less than 2 seconds.  Neurological:     Mental Status: She is alert and oriented to person, place, and time.  Psychiatric:        Mood and Affect: Mood normal.     ED Results / Procedures / Treatments   Labs (all labs ordered are listed, but only abnormal results are displayed) Labs Reviewed  RESP  PANEL BY RT-PCR (RSV, FLU A&B, COVID)  [id number] - Abnormal; Notable for the following components:      Result Value   Influenza A by PCR POSITIVE (*)    All other components within normal limits  CBC - Abnormal; Notable for the following components:   RBC 2.63 (*)    Hemoglobin 8.6 (*)    HCT 27.0 (*)    MCV 102.7 (*)    Platelets 129 (*)    All other components within normal limits  BASIC METABOLIC PANEL - Abnormal; Notable for the following components:  Potassium 5.3 (*)    Glucose, Bld 283 (*)    BUN 46 (*)    Creatinine, Ser 2.33 (*)    Calcium 8.1 (*)    GFR, Estimated 20 (*)    All other components within normal limits    EKG None  Radiology DG Chest 2 View  Result Date: 03/06/2022 CLINICAL DATA:  Cough, shortness of breath EXAM: CHEST - 2 VIEW COMPARISON:  01/24/2022 FINDINGS: The heart size and mediastinal contours are within normal limits. Aortic atherosclerosis. Post treatment changes within the left mid lung. No new airspace consolidation. No pleural effusion or pneumothorax. The visualized skeletal structures are unremarkable. IMPRESSION: No active cardiopulmonary disease. Electronically Signed   By: Davina Poke D.O.   On: 03/06/2022 17:28    Procedures Procedures   Medications Ordered in ED Medications  sodium chloride 0.9 % bolus 1,000 mL (1,000 mLs Intravenous New Bag/Given 03/06/22 1759)    ED Course/ Medical Decision Making/ A&P Clinical Course as of 03/06/22 1918  Mon Mar 06, 2022  1907 Stable 40 YOF with cough. N/V/D. Has Flu. Cr slight elevation, s/p IVF. Feels better. [CC]    Clinical Course User Index [CC] Tretha Sciara, MD                           Medical Decision Making Amount and/or Complexity of Data Reviewed Labs: ordered. Radiology: ordered.   81 year old female presents to the ED for evaluation of flulike symptoms.  Please see HPI for further details.  On my examination the patient is afebrile, nontachycardic.  Patient  sounds are clear bilaterally, she is not hypoxic.  The patient abdomen is soft and compressible throughout.  The patient neurological examination shows no focal neurodeficits.  Patient posterior oropharynx without exudate, erythema.  Patient nontoxic appearance.  Patient will be assessed utilizing CBC, BMP, chest x-ray, viral testing.  Patient CBC with hemoglobin of 8.6.  This is around the patient baseline of 9.4.  The patient is without leukocytosis.  The patient BMP shows elevated potassium to 5.3, elevated creatinine at 2.33.  Patient chart review shows creatinine of 1.86 on 11/14.  Patient was provided with IV fluids 1 L.  Patient EKG ordered due to elevated potassium however no evidence of peaked T waves.  Patient viral testing positive for flu A.  The patient chest x-ray shows no consolidations or effusions.  After 1 L of fluid given, patient reports he feels better.  The patient will be discharged home with Zofran for nausea control and advised to start taking Imodium.  Patient was given strict return precautions and she voiced understanding with these.  The patient was advised to present to her PCP in 3 to 4 days for lab recheck.  Patient advised to treat symptoms at home utilizing ibuprofen, Tylenol, Zicam, fluids.  Patient voiced understanding.  The patient and all her questions answered to her satisfaction.  The patient is stable for discharge.  Final Clinical Impression(s) / ED Diagnoses Final diagnoses:  Influenza A    Rx / DC Orders ED Discharge Orders          Ordered    ondansetron (ZOFRAN) 4 MG tablet  Every 6 hours PRN        03/06/22 1918              Azucena Cecil, PA-C 03/06/22 1918    Tretha Sciara, MD 03/06/22 2307

## 2022-03-06 NOTE — ED Notes (Signed)
Discharge paperwork given and verbally understood. 

## 2022-03-28 ENCOUNTER — Telehealth: Payer: Self-pay

## 2022-03-28 NOTE — Telephone Encounter (Signed)
RN Anderson Malta called pt back concerning new concerning place on her leg. The pt stated she already has an appointment with Dermatology and her her PCP as well in a couple of weeks. She states that when this area gets more red and irritated her whole swells up. Rn encouraged pt to go the Emergency room is needed with this leg condition. She also denied fevers of chills with these episodes. RN encouraged pt of follow up with any questions or concerns.

## 2022-03-28 NOTE — Telephone Encounter (Signed)
Pt called and left voice mail stating she had found a new spot on her leg she was concerned about and wanted to know which md she should follow up with. Rn will seek feedback from Dr. Isidore Moos and call pt back to assist her in this matter.

## 2022-04-27 ENCOUNTER — Telehealth: Payer: Self-pay | Admitting: Hematology and Oncology

## 2022-04-27 NOTE — Telephone Encounter (Signed)
Contacted patient to scheduled appointments. Patient is aware of appointments that are scheduled.   

## 2022-05-01 DIAGNOSIS — E7800 Pure hypercholesterolemia, unspecified: Secondary | ICD-10-CM | POA: Diagnosis not present

## 2022-05-01 DIAGNOSIS — N183 Chronic kidney disease, stage 3 unspecified: Secondary | ICD-10-CM | POA: Diagnosis not present

## 2022-05-01 DIAGNOSIS — E559 Vitamin D deficiency, unspecified: Secondary | ICD-10-CM | POA: Diagnosis not present

## 2022-05-01 DIAGNOSIS — E1142 Type 2 diabetes mellitus with diabetic polyneuropathy: Secondary | ICD-10-CM | POA: Diagnosis not present

## 2022-05-01 DIAGNOSIS — E039 Hypothyroidism, unspecified: Secondary | ICD-10-CM | POA: Diagnosis not present

## 2022-05-01 DIAGNOSIS — I10 Essential (primary) hypertension: Secondary | ICD-10-CM | POA: Diagnosis not present

## 2022-05-03 DIAGNOSIS — D2271 Melanocytic nevi of right lower limb, including hip: Secondary | ICD-10-CM | POA: Diagnosis not present

## 2022-05-03 DIAGNOSIS — I872 Venous insufficiency (chronic) (peripheral): Secondary | ICD-10-CM | POA: Diagnosis not present

## 2022-05-03 DIAGNOSIS — L570 Actinic keratosis: Secondary | ICD-10-CM | POA: Diagnosis not present

## 2022-05-03 DIAGNOSIS — X32XXXD Exposure to sunlight, subsequent encounter: Secondary | ICD-10-CM | POA: Diagnosis not present

## 2022-05-04 DIAGNOSIS — E7800 Pure hypercholesterolemia, unspecified: Secondary | ICD-10-CM | POA: Diagnosis not present

## 2022-05-04 DIAGNOSIS — Z0000 Encounter for general adult medical examination without abnormal findings: Secondary | ICD-10-CM | POA: Diagnosis not present

## 2022-05-04 DIAGNOSIS — Z940 Kidney transplant status: Secondary | ICD-10-CM | POA: Diagnosis not present

## 2022-05-04 DIAGNOSIS — G2581 Restless legs syndrome: Secondary | ICD-10-CM | POA: Diagnosis not present

## 2022-05-04 DIAGNOSIS — I6523 Occlusion and stenosis of bilateral carotid arteries: Secondary | ICD-10-CM | POA: Diagnosis not present

## 2022-05-04 DIAGNOSIS — G4700 Insomnia, unspecified: Secondary | ICD-10-CM | POA: Diagnosis not present

## 2022-05-04 DIAGNOSIS — E559 Vitamin D deficiency, unspecified: Secondary | ICD-10-CM | POA: Diagnosis not present

## 2022-05-04 DIAGNOSIS — M810 Age-related osteoporosis without current pathological fracture: Secondary | ICD-10-CM | POA: Diagnosis not present

## 2022-05-04 DIAGNOSIS — K219 Gastro-esophageal reflux disease without esophagitis: Secondary | ICD-10-CM | POA: Diagnosis not present

## 2022-05-09 DIAGNOSIS — Z08 Encounter for follow-up examination after completed treatment for malignant neoplasm: Secondary | ICD-10-CM | POA: Diagnosis not present

## 2022-05-09 DIAGNOSIS — Z8521 Personal history of malignant neoplasm of larynx: Secondary | ICD-10-CM | POA: Diagnosis not present

## 2022-05-16 DIAGNOSIS — D649 Anemia, unspecified: Secondary | ICD-10-CM | POA: Diagnosis not present

## 2022-05-16 DIAGNOSIS — K649 Unspecified hemorrhoids: Secondary | ICD-10-CM | POA: Diagnosis not present

## 2022-05-23 ENCOUNTER — Encounter: Payer: Self-pay | Admitting: Hematology and Oncology

## 2022-05-23 ENCOUNTER — Inpatient Hospital Stay: Payer: Medicare PPO

## 2022-05-23 ENCOUNTER — Inpatient Hospital Stay: Payer: Medicare PPO | Attending: Hematology and Oncology | Admitting: Hematology and Oncology

## 2022-05-23 ENCOUNTER — Ambulatory Visit: Payer: Medicare Other | Admitting: Hematology and Oncology

## 2022-05-23 VITALS — BP 137/52 | HR 68 | Temp 97.9°F | Resp 16 | Ht 59.0 in | Wt 126.3 lb

## 2022-05-23 DIAGNOSIS — Z853 Personal history of malignant neoplasm of breast: Secondary | ICD-10-CM | POA: Diagnosis not present

## 2022-05-23 DIAGNOSIS — Z8719 Personal history of other diseases of the digestive system: Secondary | ICD-10-CM | POA: Diagnosis not present

## 2022-05-23 DIAGNOSIS — Z880 Allergy status to penicillin: Secondary | ICD-10-CM | POA: Insufficient documentation

## 2022-05-23 DIAGNOSIS — Z808 Family history of malignant neoplasm of other organs or systems: Secondary | ICD-10-CM | POA: Insufficient documentation

## 2022-05-23 DIAGNOSIS — K649 Unspecified hemorrhoids: Secondary | ICD-10-CM | POA: Insufficient documentation

## 2022-05-23 DIAGNOSIS — D649 Anemia, unspecified: Secondary | ICD-10-CM | POA: Diagnosis not present

## 2022-05-23 DIAGNOSIS — I10 Essential (primary) hypertension: Secondary | ICD-10-CM | POA: Insufficient documentation

## 2022-05-23 DIAGNOSIS — Z79899 Other long term (current) drug therapy: Secondary | ICD-10-CM | POA: Insufficient documentation

## 2022-05-23 DIAGNOSIS — Z940 Kidney transplant status: Secondary | ICD-10-CM | POA: Insufficient documentation

## 2022-05-23 DIAGNOSIS — Z9049 Acquired absence of other specified parts of digestive tract: Secondary | ICD-10-CM | POA: Insufficient documentation

## 2022-05-23 DIAGNOSIS — Z87442 Personal history of urinary calculi: Secondary | ICD-10-CM | POA: Diagnosis not present

## 2022-05-23 DIAGNOSIS — Z833 Family history of diabetes mellitus: Secondary | ICD-10-CM | POA: Insufficient documentation

## 2022-05-23 DIAGNOSIS — D500 Iron deficiency anemia secondary to blood loss (chronic): Secondary | ICD-10-CM | POA: Insufficient documentation

## 2022-05-23 DIAGNOSIS — Z90710 Acquired absence of both cervix and uterus: Secondary | ICD-10-CM | POA: Diagnosis not present

## 2022-05-23 DIAGNOSIS — Z87891 Personal history of nicotine dependence: Secondary | ICD-10-CM | POA: Diagnosis not present

## 2022-05-23 DIAGNOSIS — Z83719 Family history of colon polyps, unspecified: Secondary | ICD-10-CM | POA: Insufficient documentation

## 2022-05-23 DIAGNOSIS — Z923 Personal history of irradiation: Secondary | ICD-10-CM | POA: Diagnosis not present

## 2022-05-23 DIAGNOSIS — Z8249 Family history of ischemic heart disease and other diseases of the circulatory system: Secondary | ICD-10-CM | POA: Diagnosis not present

## 2022-05-23 DIAGNOSIS — E119 Type 2 diabetes mellitus without complications: Secondary | ICD-10-CM | POA: Insufficient documentation

## 2022-05-23 DIAGNOSIS — Z818 Family history of other mental and behavioral disorders: Secondary | ICD-10-CM | POA: Diagnosis not present

## 2022-05-23 DIAGNOSIS — Z8521 Personal history of malignant neoplasm of larynx: Secondary | ICD-10-CM | POA: Diagnosis not present

## 2022-05-23 DIAGNOSIS — R5383 Other fatigue: Secondary | ICD-10-CM | POA: Insufficient documentation

## 2022-05-23 DIAGNOSIS — Z888 Allergy status to other drugs, medicaments and biological substances status: Secondary | ICD-10-CM | POA: Diagnosis not present

## 2022-05-23 LAB — CBC WITH DIFFERENTIAL/PLATELET
Abs Immature Granulocytes: 0.03 10*3/uL (ref 0.00–0.07)
Basophils Absolute: 0 10*3/uL (ref 0.0–0.1)
Basophils Relative: 0 %
Eosinophils Absolute: 0 10*3/uL (ref 0.0–0.5)
Eosinophils Relative: 1 %
HCT: 27.1 % — ABNORMAL LOW (ref 36.0–46.0)
Hemoglobin: 8.8 g/dL — ABNORMAL LOW (ref 12.0–15.0)
Immature Granulocytes: 1 %
Lymphocytes Relative: 17 %
Lymphs Abs: 0.9 10*3/uL (ref 0.7–4.0)
MCH: 33.5 pg (ref 26.0–34.0)
MCHC: 32.5 g/dL (ref 30.0–36.0)
MCV: 103 fL — ABNORMAL HIGH (ref 80.0–100.0)
Monocytes Absolute: 0.3 10*3/uL (ref 0.1–1.0)
Monocytes Relative: 5 %
Neutro Abs: 4.2 10*3/uL (ref 1.7–7.7)
Neutrophils Relative %: 76 %
Platelets: 177 10*3/uL (ref 150–400)
RBC: 2.63 MIL/uL — ABNORMAL LOW (ref 3.87–5.11)
RDW: 13.4 % (ref 11.5–15.5)
WBC: 5.5 10*3/uL (ref 4.0–10.5)
nRBC: 0 % (ref 0.0–0.2)

## 2022-05-23 LAB — TSH: TSH: 0.109 u[IU]/mL — ABNORMAL LOW (ref 0.350–4.500)

## 2022-05-23 LAB — RETICULOCYTES
Immature Retic Fract: 12.3 % (ref 2.3–15.9)
RBC.: 2.58 MIL/uL — ABNORMAL LOW (ref 3.87–5.11)
Retic Count, Absolute: 47 10*3/uL (ref 19.0–186.0)
Retic Ct Pct: 1.8 % (ref 0.4–3.1)

## 2022-05-23 LAB — T4, FREE: Free T4: 1.02 ng/dL (ref 0.61–1.12)

## 2022-05-23 LAB — LACTATE DEHYDROGENASE: LDH: 149 U/L (ref 98–192)

## 2022-05-23 LAB — VITAMIN B12: Vitamin B-12: 718 pg/mL (ref 180–914)

## 2022-05-23 NOTE — Progress Notes (Deleted)
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   With regards to the lung nodule, once again told her that indolent lung cancers can grow very slowly and 3 to 4 months in between imaging can be a reasonable time however Anita Pollard is very concerned about this hence have sent an in basket message to Dr. Isidore Moos.  I also strongly encouraged her to talk to Dr. Isidore Moos about this.  I will ask her to increase oral iron intake to twice a day at this point and Anita Pollard will return to clinic with [place] in about 3 to 4 months for repeat labs.  HISTORY OF PRESENTING ILLNESS:   Anita Pollard 82 y.o. female is here because of anemia.  This is a very pleasant 82 year old female patient with past medical history significant for right-sided breast cancer, cancer of the supraglottis status postradiation, type 2 diabetes, hypertension, kidney transplant referred to hematology for evaluation of slowly progressive anemia.    MEDICAL HISTORY:  Past Medical History:  Diagnosis Date   Alcohol abuse    Anemia    Arthritis    Breast cancer ([place]) 2010   Right   Chronic kidney disease    Colon polyp    Depression    Diabetes mellitus    GERD (gastroesophageal reflux disease)    Heart murmur    History of kidney stones 1976   HTN  (hypertension)    Hypothyroidism    Osteopenia    Pancreatitis    Ulcerative esophagitis     SURGICAL HISTORY: Past Surgical History:  Procedure Laterality Date   ABDOMINAL HYSTERECTOMY     ABDOMINOPLASTY     APPENDECTOMY     BREAST BIOPSY Right 08/03/2008   Stereo Bx, Malignant   BREAST LUMPECTOMY Right 08/26/2008   CATARACT EXTRACTION W/ INTRAOCULAR LENS  IMPLANT, BILATERAL     CO2 LASER APPLICATION N/A [id number]   Procedure: CO2 LASER APPLICATION;  Surgeon: Melida Quitter, MD;  Location: [facility];  Service: ENT;  Laterality: N/A;   COSMETIC SURGERY     ductal carcinoma     right breast    EYE SURGERY     HIP ARTHROPLASTY Right    INSERTION OF DIALYSIS CATHETER Right 09/25/2012   Procedure: INSERTION OF DIALYSIS CATHETER;  Surgeon: Mal Misty, MD;  Location: [facility] OR;  Service: Vascular;  Laterality: Right;  Righ Internal Jugular Placement   IR GASTROSTOMY TUBE MOD SED  02/10/2020   IR GASTROSTOMY TUBE REMOVAL  05/25/2020   JOINT REPLACEMENT     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   MICROLARYNGOSCOPY N/A 10/20/2019   Procedure: Suspended Microlaryngoscopy with Biopsy;  Surgeon: Melida Quitter, MD;  Location: [place];  Service: ENT;  Laterality: N/A;   MICROLARYNGOSCOPY N/A 01/09/2020   Procedure: MICROLARYNGOSCOPY WITH BIOPSY;  Surgeon: Melida Quitter, MD;  Location: [place];  Service: ENT;  Laterality: N/A;   RIGID ESOPHAGOSCOPY N/A 10/20/2019   Procedure: RIGID ESOPHAGOSCOPY;  Surgeon: Melida Quitter, MD;  Location: [place];  Service: ENT;  Laterality: N/A;   THYROIDECTOMY     TUMMY TUCK      SOCIAL HISTORY: Social History   Socioeconomic History   Marital status: Widowed    Spouse name: Not on file   Number of children: Not on file   Years of education: Not on file   Highest education level: Not on file  Occupational History   Not on file  Tobacco Use   Smoking status: Former    Types: Cigarettes    Quit date: 09/25/1983    Years since quitting: 38.6   Smokeless tobacco:  Never  Vaping Use   Vaping Use: Never used  Substance and Sexual Activity   Alcohol use: Yes    Comment: socially history of heavy drinking with intervention x 2-at least 2 occasions since april '14   Drug use: No   Sexual activity: Never  Other Topics Concern   Not on file  Social History Narrative   Not on file   Social Determinants of Health   Financial Resource Strain: Low Risk  (01/22/2020)   Overall Financial Resource Strain (CARDIA)    Difficulty of Paying Living Expenses: Not hard at all  Food Insecurity: No Food Insecurity (01/22/2020)   Hunger Vital Sign    Worried About Running Out of Food in the Last Year: Never true    [place] in the Last Year: Never true  Transportation Needs: No Transportation Needs (01/22/2020)   PRAPARE - Hydrologist (Medical): No    Lack of Transportation (Non-Medical): No  Physical Activity: Insufficiently Active (01/22/2020)   Exercise Vital Sign    Days of Exercise per Week: 5 days    Minutes of Exercise per Session: 20 min  Stress: Not on file  Social Connections: Moderately Isolated (01/22/2020)   Social Connection and Isolation Panel [NHANES]    Frequency of Communication with Friends and Family: More than three times a week    Frequency of Social Gatherings with Friends and Family: Never    Attends Religious Services: Never    Marine scientist or Organizations: Yes    Attends Archivist Meetings: Never    Marital Status: Widowed  Human resources officer Violence: Not on file    FAMILY HISTORY: Family History  Problem Relation Age of Onset   Hypertension Mother    Diabetes Father    Hypertension Father    Hypertension Sister    Dementia Sister    Multiple myeloma Sister    Thyroid cancer Daughter 32   Diabetes Brother    Colon polyps Brother    Atrial fibrillation Brother    Breast cancer Neg Hx     ALLERGIES:  is allergic to penicillins, banana, detrol [tolterodine],  quetiapine, risperidone and related, and lisinopril.  MEDICATIONS:  Current Outpatient Medications  Medication Sig Dispense Refill   amLODipine (NORVASC) 5 MG tablet Take 5 mg by mouth at bedtime.     carvedilol (COREG) 3.125 MG tablet Take 3.12 mg by mouth 2 (two) times daily with a meal.     Cholecalciferol (VITAMIN D3) 5000 UNITS TABS Take 5,000 Units by mouth 3 (three) times daily after meals.      ferrous sulfate 324 MG TBEC Take 324 mg by mouth 2 (two) times daily. Takes     furosemide (LASIX) 40 MG tablet Take 1 tablet (40 mg total) by mouth daily. 90 tablet 3  glipiZIDE (GLUCOTROL XL) 2.5 MG 24 hr tablet Take 5 mg by mouth 2 (two) times daily.      insulin glargine, 1 Unit Dial, (TOUJEO SOLOSTAR) 300 UNIT/ML Solostar Pen 5 Units.     Lancets (FREESTYLE) lancets by Does not apply route.     levothyroxine (SYNTHROID) 200 MCG tablet Take 200 mcg by mouth daily before breakfast.      losartan (COZAAR) 50 MG tablet Take 50 mg by mouth daily.     Melatonin 5 MG CAPS Take 5 mg by mouth See admin instructions. Take 5 mg 2 hours before bedtime     Multiple Vitamins-Minerals (ICAPS AREDS 2 PO) Take 1 tablet by mouth 2 (two) times daily.     omeprazole (PRILOSEC) 20 MG capsule Take 20 mg by mouth daily.     ondansetron (ZOFRAN) 4 MG tablet Take 1 tablet (4 mg total) by mouth every 6 (six) hours as needed for nausea or vomiting. 12 tablet 0   Polyethyl Glycol-Propyl Glycol (SYSTANE OP) Place 1 drop into both eyes every evening.     pramipexole (MIRAPEX) 0.25 MG tablet Take 0.25-0.5 mg by mouth See admin instructions. Take 0.5 mg 2 hours prior to bedtime then 0.25 mg at bedtime     predniSONE (DELTASONE) 10 MG tablet Take 10 mg by mouth daily with breakfast.      raloxifene (EVISTA) 60 MG tablet Take 60 mg by mouth daily.     rosuvastatin (CRESTOR) 5 MG tablet Take 1 tablet (5 mg total) by mouth 2 (two) times a week. Take on Mondays and Thursdays. 30 tablet 3   tacrolimus (PROGRAF) 0.5 MG capsule  Take 2 mg by mouth every 12 (twelve) hours.     temazepam (RESTORIL) 15 MG capsule Take 15-30 mg by mouth at bedtime.      vitamin B-12 (CYANOCOBALAMIN) 500 MCG tablet Take 500 mcg by mouth daily.     No current facility-administered medications for this visit.   PHYSICAL EXAMINATION:  ECOG PERFORMANCE STATUS: 0 - Asymptomatic  Vitals:   05/23/22 1242  BP: (!) 137/52  Pulse: 68  Resp: 16  Temp: 97.9 F (36.6 C)  SpO2: 99%    Physical Exam Constitutional:      Appearance: Normal appearance.  Cardiovascular:     Rate and Rhythm: Normal rate and regular rhythm.     Pulses: Normal pulses.     Heart sounds: Normal heart sounds.  Pulmonary:     Effort: Pulmonary effort is normal.     Breath sounds: Normal breath sounds.  Abdominal:     General: Abdomen is flat. Bowel sounds are normal. There is no distension.     Palpations: Abdomen is soft. There is no mass.  Musculoskeletal:        General: No swelling or tenderness.     Cervical back: Normal range of motion and neck supple.  Neurological:     Mental Status: Anita Pollard is alert.  Psychiatric:        Mood and Affect: Mood normal.      LABORATORY DATA:  I have reviewed the data as listed Lab Results  Component Value Date   WBC 6.8 03/06/2022   HGB 8.6 (L) 03/06/2022   HCT 27.0 (L) 03/06/2022   MCV 102.7 (H) 03/06/2022   PLT 129 (L) 03/06/2022     Chemistry      Component Value Date/Time   NA 135 03/06/2022 1730   NA 137 03/22/2020 1439   NA 140 08/28/2013 0955  K 5.3 (H) 03/06/2022 1730   K 3.0 (LL) 08/28/2013 0955   CL 100 03/06/2022 1730   CL 111 (H) 09/03/2012 1249   CO2 28 03/06/2022 1730   CO2 16 (L) 08/28/2013 0955   BUN 46 (H) 03/06/2022 1730   BUN 42 (H) 03/22/2020 1439   BUN 75.7 (H) 08/28/2013 0955   CREATININE 2.33 (H) 03/06/2022 1730   CREATININE 1.67 (H) 09/28/2020 1432   CREATININE 5.9 (HH) 08/28/2013 0955      Component Value Date/Time   CALCIUM 8.1 (L) 03/06/2022 1730   CALCIUM 7.5 (L)  08/28/2013 0955   ALKPHOS 111 09/28/2020 1432   ALKPHOS 62 08/28/2013 0955   AST 15 09/28/2020 1432   AST 15 08/28/2013 0955   ALT 15 09/28/2020 1432   ALT 17 08/28/2013 0955   BILITOT 0.3 09/28/2020 1432   BILITOT 0.38 08/28/2013 0955     Reviewed labs from [place] done on December 14.  Her white blood cell count of 5.7, hemoglobin is 10.3, platelet count of 210,000, absolute lymphocytes were low and absolute eosinophils were low, otherwise iron panel showed a ferritin of 46 and total iron binding capacity of 326, iron saturation of 25%.   07/21/2021, CBC showed white blood cell count of 6000, hemoglobin of 10, hematocrit of 30.2 and platelet count of 187,000. Iron and iron binding panel normal.  B12 is normal.  01/19/2022 CBC showed hemoglobin of 9.2 g/dL, total white blood cell count of 5.6 and platelet count of 183,000.  Iron panel with increasing total iron binding capacity suggestive of iron deficiency, ferritin pending  RADIOGRAPHIC STUDIES: I have personally reviewed the radiological images as listed and agreed with the findings in the report. No results found.  I spent 30 minutes in the care of this patient including history, review of records, discussion of labs and follow-up recommendations.    Benay Pike, MD 05/23/2022 12:45 PM

## 2022-05-23 NOTE — Progress Notes (Signed)
[facility] CONSULT NOTE  Patient Care Team: Lawerance Cruel, MD as PCP - General (Family Medicine) Lawerance Cruel, MD as Attending Physician (Family Medicine) Eppie Gibson, MD as Consulting Physician (Radiation Oncology) Malmfelt, Stephani Police, RN as Oncology Nurse Navigator Jerline Pain, MD as Consulting Physician (Cardiology)  CHIEF COMPLAINTS/PURPOSE OF CONSULTATION:  Normocytic normochromic anemia   ASSESSMENT & PLAN:   This is a very pleasant 82 year old female patient with past medical history significant for diabetes, hypertension, breast cancer, malignant neoplasm of supraglottis, 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  She is here for follow-up.  Since her last visit here, she followed up with gastroenterology and they do not believe her anemia is related to hemorrhoidal bleeding.  She also follows up with Dr. Isidore Moos for her history of head and neck cancer in the lung cancer treated with radiation.  She continues to feel fatigued, otherwise denies any major symptoms.  CBC today shows ongoing anemia microcytic with a hemoglobin of 8.8.  No evidence of hemolysis.  I also ordered SPEP, kappa lambda ratio, thyroid labs today which are pending. 1 week telephone visit to review labs, she also may need BMB.  HISTORY OF PRESENTING ILLNESS:   Anita Pollard 82 y.o. female is here because of anemia.  This is a very pleasant 82 year old female patient with past medical history significant for right-sided breast cancer, cancer of the supraglottis status postradiation, type 2 diabetes, hypertension, kidney transplant referred to hematology for evaluation of slowly progressive anemia. She continues to have ongoing fatigue.  She has also seen a gastroenterologist who mentioned to her that her hemorrhoidal bleeding should not cause her severe anemia.  Other than the fatigue, she denies any new  complaints. MEDICAL HISTORY:  Past Medical History:  Diagnosis Date   Alcohol abuse    Anemia    Arthritis    Breast cancer ([place]) 2010   Right   Chronic kidney disease    Colon polyp    Depression    Diabetes mellitus    GERD (gastroesophageal reflux disease)    Heart murmur    History of kidney stones 1976   HTN (hypertension)    Hypothyroidism    Osteopenia    Pancreatitis    Ulcerative esophagitis     SURGICAL HISTORY: Past Surgical History:  Procedure Laterality Date   ABDOMINAL HYSTERECTOMY     ABDOMINOPLASTY     APPENDECTOMY     BREAST BIOPSY Right 08/03/2008   Stereo Bx, Malignant   BREAST LUMPECTOMY Right 08/26/2008   CATARACT EXTRACTION W/ INTRAOCULAR LENS  IMPLANT, BILATERAL     CO2 LASER APPLICATION N/A [id number]   Procedure: CO2 LASER APPLICATION;  Surgeon: Melida Quitter, MD;  Location: [facility] OR;  Service: ENT;  Laterality: N/A;   COSMETIC SURGERY     ductal carcinoma     right breast    EYE SURGERY     HIP ARTHROPLASTY Right    INSERTION OF DIALYSIS CATHETER Right 09/25/2012   Procedure: INSERTION OF DIALYSIS CATHETER;  Surgeon: Mal Misty, MD;  Location: [facility] OR;  Service: Vascular;  Laterality: Right;  Righ Internal Jugular Placement   IR GASTROSTOMY TUBE MOD SED  02/10/2020   IR GASTROSTOMY TUBE REMOVAL  05/25/2020   JOINT REPLACEMENT     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   MICROLARYNGOSCOPY N/A 10/20/2019   Procedure: Suspended Microlaryngoscopy with Biopsy;  Surgeon: Melida Quitter, MD;  Location: [place];  Service: ENT;  Laterality: N/A;   MICROLARYNGOSCOPY N/A 01/09/2020   Procedure: MICROLARYNGOSCOPY WITH BIOPSY;  Surgeon: Melida Quitter, MD;  Location: [place];  Service: ENT;  Laterality: N/A;   RIGID ESOPHAGOSCOPY N/A 10/20/2019   Procedure: RIGID ESOPHAGOSCOPY;  Surgeon: Melida Quitter, MD;  Location: [place];  Service: ENT;  Laterality: N/A;   THYROIDECTOMY     TUMMY TUCK      SOCIAL HISTORY: Social History   Socioeconomic History   Marital  status: Widowed    Spouse name: Not on file   Number of children: Not on file   Years of education: Not on file   Highest education level: Not on file  Occupational History   Not on file  Tobacco Use   Smoking status: Former    Types: Cigarettes    Quit date: 09/25/1983    Years since quitting: 38.6   Smokeless tobacco: Never  Vaping Use   Vaping Use: Never used  Substance and Sexual Activity   Alcohol use: Yes    Comment: socially history of heavy drinking with intervention x 2-at least 2 occasions since april '14   Drug use: No   Sexual activity: Never  Other Topics Concern   Not on file  Social History Narrative   Not on file   Social Determinants of Health   Financial Resource Strain: Low Risk  (01/22/2020)   Overall Financial Resource Strain (CARDIA)    Difficulty of Paying Living Expenses: Not hard at all  Food Insecurity: No Food Insecurity (01/22/2020)   Hunger Vital Sign    Worried About Running Out of Food in the Last Year: Never true    [place] in the Last Year: Never true  Transportation Needs: No Transportation Needs (01/22/2020)   PRAPARE - Hydrologist (Medical): No    Lack of Transportation (Non-Medical): No  Physical Activity: Insufficiently Active (01/22/2020)   Exercise Vital Sign    Days of Exercise per Week: 5 days    Minutes of Exercise per Session: 20 min  Stress: Not on file  Social Connections: Moderately Isolated (01/22/2020)   Social Connection and Isolation Panel [NHANES]    Frequency of Communication with Friends and Family: More than three times a week    Frequency of Social Gatherings with Friends and Family: Never    Attends Religious Services: Never    Marine scientist or Organizations: Yes    Attends Archivist Meetings: Never    Marital Status: Widowed  Human resources officer Violence: Not on file    FAMILY HISTORY: Family History  Problem Relation Age of Onset   Hypertension Mother     Diabetes Father    Hypertension Father    Hypertension Sister    Dementia Sister    Multiple myeloma Sister    Thyroid cancer Daughter 31   Diabetes Brother    Colon polyps Brother    Atrial fibrillation Brother    Breast cancer Neg Hx     ALLERGIES:  is allergic to penicillins, banana, detrol [tolterodine], quetiapine, risperidone and related, and lisinopril.  MEDICATIONS:  Current Outpatient Medications  Medication Sig Dispense Refill   amLODipine (NORVASC) 5 MG tablet Take 5 mg by mouth at bedtime.     carvedilol (COREG) 3.125 MG tablet Take 3.12 mg by mouth 2 (two) times daily with a meal.     Cholecalciferol (VITAMIN D3) 5000 UNITS TABS Take 5,000 Units by mouth 3 (three) times daily after  meals.      ferrous sulfate 324 MG TBEC Take 324 mg by mouth 2 (two) times daily. Takes     furosemide (LASIX) 40 MG tablet Take 1 tablet (40 mg total) by mouth daily. 90 tablet 3   glipiZIDE (GLUCOTROL XL) 2.5 MG 24 hr tablet Take 5 mg by mouth 2 (two) times daily.      insulin glargine, 1 Unit Dial, (TOUJEO SOLOSTAR) 300 UNIT/ML Solostar Pen 5 Units.     Lancets (FREESTYLE) lancets by Does not apply route.     levothyroxine (SYNTHROID) 200 MCG tablet Take 200 mcg by mouth daily before breakfast.      losartan (COZAAR) 50 MG tablet Take 50 mg by mouth daily.     Melatonin 5 MG CAPS Take 5 mg by mouth See admin instructions. Take 5 mg 2 hours before bedtime     Multiple Vitamins-Minerals (ICAPS AREDS 2 PO) Take 1 tablet by mouth 2 (two) times daily.     omeprazole (PRILOSEC) 20 MG capsule Take 20 mg by mouth daily.     ondansetron (ZOFRAN) 4 MG tablet Take 1 tablet (4 mg total) by mouth every 6 (six) hours as needed for nausea or vomiting. 12 tablet 0   Polyethyl Glycol-Propyl Glycol (SYSTANE OP) Place 1 drop into both eyes every evening.     pramipexole (MIRAPEX) 0.25 MG tablet Take 0.25-0.5 mg by mouth See admin instructions. Take 0.5 mg 2 hours prior to bedtime then 0.25 mg at bedtime      predniSONE (DELTASONE) 10 MG tablet Take 10 mg by mouth daily with breakfast.      raloxifene (EVISTA) 60 MG tablet Take 60 mg by mouth daily.     rosuvastatin (CRESTOR) 5 MG tablet Take 1 tablet (5 mg total) by mouth 2 (two) times a week. Take on Mondays and Thursdays. 30 tablet 3   tacrolimus (PROGRAF) 0.5 MG capsule Take 2 mg by mouth every 12 (twelve) hours.     temazepam (RESTORIL) 15 MG capsule Take 15-30 mg by mouth at bedtime.      vitamin B-12 (CYANOCOBALAMIN) 500 MCG tablet Take 500 mcg by mouth daily.     No current facility-administered medications for this visit.   PHYSICAL EXAMINATION:  ECOG PERFORMANCE STATUS: 0 - Asymptomatic  Vitals:   05/23/22 1242  BP: (!) 137/52  Pulse: 68  Resp: 16  Temp: 97.9 F (36.6 C)  SpO2: 99%    Physical Exam Constitutional:      Appearance: Normal appearance.  Cardiovascular:     Rate and Rhythm: Normal rate and regular rhythm.     Pulses: Normal pulses.     Heart sounds: Normal heart sounds.  Pulmonary:     Effort: Pulmonary effort is normal.     Breath sounds: Normal breath sounds.  Abdominal:     General: Abdomen is flat. Bowel sounds are normal. There is no distension.     Palpations: Abdomen is soft. There is no mass.  Musculoskeletal:        General: No swelling or tenderness.     Cervical back: Normal range of motion and neck supple.  Neurological:     Mental Status: She is alert.  Psychiatric:        Mood and Affect: Mood normal.     LABORATORY DATA:  I have reviewed the data as listed Lab Results  Component Value Date   WBC 6.8 03/06/2022   HGB 8.6 (L) 03/06/2022   HCT 27.0 (L) 03/06/2022  MCV 102.7 (H) 03/06/2022   PLT 129 (L) 03/06/2022     Chemistry      Component Value Date/Time   NA 135 03/06/2022 1730   NA 137 03/22/2020 1439   NA 140 08/28/2013 0955   K 5.3 (H) 03/06/2022 1730   K 3.0 (LL) 08/28/2013 0955   CL 100 03/06/2022 1730   CL 111 (H) 09/03/2012 1249   CO2 28 03/06/2022 1730    CO2 16 (L) 08/28/2013 0955   BUN 46 (H) 03/06/2022 1730   BUN 42 (H) 03/22/2020 1439   BUN 75.7 (H) 08/28/2013 0955   CREATININE 2.33 (H) 03/06/2022 1730   CREATININE 1.67 (H) 09/28/2020 1432   CREATININE 5.9 (HH) 08/28/2013 0955      Component Value Date/Time   CALCIUM 8.1 (L) 03/06/2022 1730   CALCIUM 7.5 (L) 08/28/2013 0955   ALKPHOS 111 09/28/2020 1432   ALKPHOS 62 08/28/2013 0955   AST 15 09/28/2020 1432   AST 15 08/28/2013 0955   ALT 15 09/28/2020 1432   ALT 17 08/28/2013 0955   BILITOT 0.3 09/28/2020 1432   BILITOT 0.38 08/28/2013 0955     Reviewed labs from [place] done on December 14.  Her white blood cell count of 5.7, hemoglobin is 10.3, platelet count of 210,000, absolute lymphocytes were low and absolute eosinophils were low, otherwise iron panel showed a ferritin of 46 and total iron binding capacity of 326, iron saturation of 25%.   07/21/2021, CBC showed white blood cell count of 6000, hemoglobin of 10, hematocrit of 30.2 and platelet count of 187,000. Iron and iron binding panel normal.  B12 is normal.  01/19/2022 CBC showed hemoglobin of 9.2 g/dL, total white blood cell count of 5.6 and platelet count of 183,000.  Iron panel with increasing total iron binding capacity suggestive of iron deficiency, ferritin pending  We have reviewed labs that were available from February.  She also had some labs today.  RADIOGRAPHIC STUDIES: I have personally reviewed the radiological images as listed and agreed with the findings in the report. No results found.  I spent 30 minutes in the care of this patient including history, review of records, discussion of labs and follow-up recommendations.    Benay Pike, MD 05/23/2022 12:44 PM

## 2022-05-24 ENCOUNTER — Telehealth: Payer: Self-pay | Admitting: Hematology and Oncology

## 2022-05-24 DIAGNOSIS — L821 Other seborrheic keratosis: Secondary | ICD-10-CM | POA: Diagnosis not present

## 2022-05-24 DIAGNOSIS — D2271 Melanocytic nevi of right lower limb, including hip: Secondary | ICD-10-CM | POA: Diagnosis not present

## 2022-05-24 DIAGNOSIS — D485 Neoplasm of uncertain behavior of skin: Secondary | ICD-10-CM | POA: Diagnosis not present

## 2022-05-24 DIAGNOSIS — L570 Actinic keratosis: Secondary | ICD-10-CM | POA: Diagnosis not present

## 2022-05-24 DIAGNOSIS — X32XXXD Exposure to sunlight, subsequent encounter: Secondary | ICD-10-CM | POA: Diagnosis not present

## 2022-05-24 LAB — FOLATE RBC
Folate, Hemolysate: >620 ng/mL
Folate, RBC: >2279 ng/mL (ref >498)
Hematocrit: 27.2 % — ABNORMAL LOW (ref 34.0–46.6)

## 2022-05-24 LAB — KAPPA/LAMBDA LIGHT CHAINS
Kappa free light chain: 42.8 mg/L — ABNORMAL HIGH (ref 3.3–19.4)
Kappa, lambda light chain ratio: 1.62 (ref 0.26–1.65)
Lambda free light chains: 26.4 mg/L — ABNORMAL HIGH (ref 5.7–26.3)

## 2022-05-24 NOTE — Telephone Encounter (Signed)
Patient aware of upcoming appointment

## 2022-05-26 DIAGNOSIS — M810 Age-related osteoporosis without current pathological fracture: Secondary | ICD-10-CM | POA: Diagnosis not present

## 2022-05-26 DIAGNOSIS — E1165 Type 2 diabetes mellitus with hyperglycemia: Secondary | ICD-10-CM | POA: Diagnosis not present

## 2022-05-26 DIAGNOSIS — N183 Chronic kidney disease, stage 3 unspecified: Secondary | ICD-10-CM | POA: Diagnosis not present

## 2022-05-26 DIAGNOSIS — I10 Essential (primary) hypertension: Secondary | ICD-10-CM | POA: Diagnosis not present

## 2022-05-26 DIAGNOSIS — Z940 Kidney transplant status: Secondary | ICD-10-CM | POA: Diagnosis not present

## 2022-05-26 DIAGNOSIS — E890 Postprocedural hypothyroidism: Secondary | ICD-10-CM | POA: Diagnosis not present

## 2022-05-26 LAB — PROTEIN ELECTROPHORESIS, SERUM, WITH REFLEX
A/G Ratio: 1.4 (ref 0.7–1.7)
Albumin ELP: 3.4 g/dL (ref 2.9–4.4)
Alpha-1-Globulin: 0.3 g/dL (ref 0.0–0.4)
Alpha-2-Globulin: 0.7 g/dL (ref 0.4–1.0)
Beta Globulin: 0.7 g/dL (ref 0.7–1.3)
Gamma Globulin: 0.7 g/dL (ref 0.4–1.8)
Globulin, Total: 2.4 g/dL (ref 2.2–3.9)
Total Protein ELP: 5.8 g/dL — ABNORMAL LOW (ref 6.0–8.5)

## 2022-05-30 ENCOUNTER — Inpatient Hospital Stay: Payer: Medicare PPO | Admitting: Hematology and Oncology

## 2022-05-31 ENCOUNTER — Ambulatory Visit: Payer: Medicare PPO | Attending: Cardiology | Admitting: Cardiology

## 2022-05-31 ENCOUNTER — Encounter: Payer: Self-pay | Admitting: Cardiology

## 2022-05-31 VITALS — BP 124/60 | HR 70 | Ht 59.0 in | Wt 125.6 lb

## 2022-05-31 DIAGNOSIS — R011 Cardiac murmur, unspecified: Secondary | ICD-10-CM

## 2022-05-31 DIAGNOSIS — I6523 Occlusion and stenosis of bilateral carotid arteries: Secondary | ICD-10-CM | POA: Diagnosis not present

## 2022-05-31 DIAGNOSIS — R600 Localized edema: Secondary | ICD-10-CM

## 2022-05-31 DIAGNOSIS — E7800 Pure hypercholesterolemia, unspecified: Secondary | ICD-10-CM | POA: Diagnosis not present
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
## 2022-05-31 NOTE — Patient Instructions (Signed)
Medication Instructions:   Your physician recommends that you continue on your current medications as directed. Please refer to the Current Medication list given to you today.   *If you need a refill on your cardiac medications before your next appointment, please call your pharmacy*   Lab Work:  None ordered.  If you have labs (blood work) drawn today and your tests are completely normal, you will receive your results only by: [place] (if you have MyChart) OR A paper copy in the mail If you have any lab test that is abnormal or we need to change your treatment, we will call you to review the results.   Testing/Procedures:  Your physician has requested that you have an echocardiogram. Echocardiography is a painless test that uses sound waves to create images of your heart. It provides your doctor with information about the size and shape of your heart and how well your heart's chambers and valves are working. This procedure takes approximately one hour. There are no restrictions for this procedure. Please do NOT wear  perfume or lotions (deodorant is allowed). Please arrive 15 minutes prior to your appointment time.  Follow-Up: At [facility], you and your health needs are our priority.  As part of our continuing mission to provide you with exceptional heart care, we have created designated Provider Care Teams.  These Care Teams include your primary Cardiologist (physician) and Advanced Practice Providers (APPs -  Physician Assistants and Nurse Practitioners) who all work together to provide you with the care you need, when you need it.  We recommend signing up for the patient portal called "MyChart".  Sign up information is provided on this After Visit Summary.  MyChart is used to connect with patients for Virtual Visits (Telemedicine).  Patients are able to view lab/test results, encounter notes, upcoming appointments, etc.  Non-urgent messages can be sent to your  provider as well.   To learn more about what you can do with MyChart, go to [email or web address].    Your next appointment:   1 year(s)  Provider:   Candee Furbish, MD    Other Instructions  Your physician wants you to follow-up in: 1 year with Dr. Marlou Porch.  You will receive a reminder letter in the mail two months in advance. If you don't receive a letter, please call our office to schedule the follow-up appointment.

## 2022-06-05 DIAGNOSIS — H353112 Nonexudative age-related macular degeneration, right eye, intermediate dry stage: Secondary | ICD-10-CM | POA: Diagnosis not present

## 2022-06-05 DIAGNOSIS — H353121 Nonexudative age-related macular degeneration, left eye, early dry stage: Secondary | ICD-10-CM | POA: Diagnosis not present

## 2022-06-05 DIAGNOSIS — E113211 Type 2 diabetes mellitus with mild nonproliferative diabetic retinopathy with macular edema, right eye: Secondary | ICD-10-CM | POA: Diagnosis not present

## 2022-06-14 ENCOUNTER — Inpatient Hospital Stay: Payer: Medicare PPO | Attending: Hematology and Oncology | Admitting: Hematology and Oncology

## 2022-06-14 VITALS — BP 126/56 | HR 62 | Temp 97.7°F | Resp 16 | Ht 59.0 in | Wt 131.8 lb

## 2022-06-14 DIAGNOSIS — Z808 Family history of malignant neoplasm of other organs or systems: Secondary | ICD-10-CM | POA: Diagnosis not present

## 2022-06-14 DIAGNOSIS — Z8249 Family history of ischemic heart disease and other diseases of the circulatory system: Secondary | ICD-10-CM | POA: Diagnosis not present

## 2022-06-14 DIAGNOSIS — Z87442 Personal history of urinary calculi: Secondary | ICD-10-CM | POA: Insufficient documentation

## 2022-06-14 DIAGNOSIS — E119 Type 2 diabetes mellitus without complications: Secondary | ICD-10-CM | POA: Diagnosis not present

## 2022-06-14 DIAGNOSIS — Z87891 Personal history of nicotine dependence: Secondary | ICD-10-CM | POA: Diagnosis not present

## 2022-06-14 DIAGNOSIS — Z90710 Acquired absence of both cervix and uterus: Secondary | ICD-10-CM | POA: Diagnosis not present

## 2022-06-14 DIAGNOSIS — Z807 Family history of other malignant neoplasms of lymphoid, hematopoietic and related tissues: Secondary | ICD-10-CM | POA: Diagnosis not present

## 2022-06-14 DIAGNOSIS — Z853 Personal history of malignant neoplasm of breast: Secondary | ICD-10-CM | POA: Diagnosis not present

## 2022-06-14 DIAGNOSIS — Z818 Family history of other mental and behavioral disorders: Secondary | ICD-10-CM | POA: Insufficient documentation

## 2022-06-14 DIAGNOSIS — Z880 Allergy status to penicillin: Secondary | ICD-10-CM | POA: Insufficient documentation

## 2022-06-14 DIAGNOSIS — Z8521 Personal history of malignant neoplasm of larynx: Secondary | ICD-10-CM | POA: Diagnosis not present

## 2022-06-14 DIAGNOSIS — Z79899 Other long term (current) drug therapy: Secondary | ICD-10-CM | POA: Diagnosis not present

## 2022-06-14 DIAGNOSIS — Z888 Allergy status to other drugs, medicaments and biological substances status: Secondary | ICD-10-CM | POA: Diagnosis not present

## 2022-06-14 DIAGNOSIS — D649 Anemia, unspecified: Secondary | ICD-10-CM

## 2022-06-14 DIAGNOSIS — Z83719 Family history of colon polyps, unspecified: Secondary | ICD-10-CM | POA: Diagnosis not present

## 2022-06-14 DIAGNOSIS — Z833 Family history of diabetes mellitus: Secondary | ICD-10-CM | POA: Diagnosis not present

## 2022-06-14 DIAGNOSIS — Z9049 Acquired absence of other specified parts of digestive tract: Secondary | ICD-10-CM | POA: Diagnosis not present

## 2022-06-14 DIAGNOSIS — D500 Iron deficiency anemia secondary to blood loss (chronic): Secondary | ICD-10-CM | POA: Diagnosis present

## 2022-06-14 DIAGNOSIS — Z8719 Personal history of other diseases of the digestive system: Secondary | ICD-10-CM | POA: Diagnosis not present

## 2022-06-14 NOTE — Progress Notes (Signed)
[place] CONSULT NOTE  Patient Care Team: Lawerance Cruel, MD as PCP - General (Family Medicine) Lawerance Cruel, MD as Attending Physician (Family Medicine) Eppie Gibson, MD as Consulting Physician (Radiation Oncology) Malmfelt, Stephani Police, RN as Oncology Nurse Navigator Jerline Pain, MD as Consulting Physician (Cardiology)  CHIEF COMPLAINTS/PURPOSE OF CONSULTATION:  Normocytic normochromic anemia   ASSESSMENT & PLAN:   This is a very pleasant 82 year old female patient with past medical history significant for diabetes, hypertension, breast cancer, malignant neoplasm of supraglottis, 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  She is here for follow-up. No evidence of nutritional def, hemolysis, Myeloma. At this time we recommended BMB to further define the etiology. If BMB is normal, then this is likely from her CKD. She is agreeable, BMB ordered. She will RTC after BMB We will consider EPO after BMB if no contraindications.  HISTORY OF PRESENTING ILLNESS:   [facility] Pollard 82 y.o. female is here because of anemia.  This is a very pleasant 82 year old female patient with past medical history significant for right-sided breast cancer, cancer of the supraglottis status postradiation, type 2 diabetes, hypertension, kidney transplant referred to hematology for evaluation of slowly progressive anemia. She continues to have ongoing fatigue.  She says she will move forward with BMB. No other complaints today.  MEDICAL HISTORY:  Past Medical History:  Diagnosis Date   Alcohol abuse    Anemia    Arthritis    Breast cancer ([place]) 2010   Right   Chronic kidney disease    Colon polyp    Depression    Diabetes mellitus    GERD (gastroesophageal reflux disease)    Heart murmur    History of kidney stones 1976   HTN (hypertension)    Hypothyroidism    Osteopenia    Pancreatitis     Ulcerative esophagitis     SURGICAL HISTORY: Past Surgical History:  Procedure Laterality Date   ABDOMINAL HYSTERECTOMY     ABDOMINOPLASTY     APPENDECTOMY     BREAST BIOPSY Right 08/03/2008   Stereo Bx, Malignant   BREAST LUMPECTOMY Right 08/26/2008   CATARACT EXTRACTION W/ INTRAOCULAR LENS  IMPLANT, BILATERAL     CO2 LASER APPLICATION N/A [id number]   Procedure: CO2 LASER APPLICATION;  Surgeon: Melida Quitter, MD;  Location: [facility] OR;  Service: ENT;  Laterality: N/A;   COSMETIC SURGERY     ductal carcinoma     right breast    EYE SURGERY     HIP ARTHROPLASTY Right    INSERTION OF DIALYSIS CATHETER Right 09/25/2012   Procedure: INSERTION OF DIALYSIS CATHETER;  Surgeon: Mal Misty, MD;  Location: [facility] OR;  Service: Vascular;  Laterality: Right;  Righ Internal Jugular Placement   IR GASTROSTOMY TUBE MOD SED  02/10/2020   IR GASTROSTOMY TUBE REMOVAL  05/25/2020   JOINT REPLACEMENT     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   MICROLARYNGOSCOPY N/A 10/20/2019   Procedure: Suspended Microlaryngoscopy with Biopsy;  Surgeon: Melida Quitter, MD;  Location: [place];  Service: ENT;  Laterality: N/A;   MICROLARYNGOSCOPY N/A 01/09/2020   Procedure: MICROLARYNGOSCOPY WITH BIOPSY;  Surgeon: Melida Quitter, MD;  Location: [place];  Service: ENT;  Laterality: N/A;   RIGID ESOPHAGOSCOPY N/A 10/20/2019   Procedure: RIGID ESOPHAGOSCOPY;  Surgeon: Melida Quitter, MD;  Location: [place];  Service: ENT;  Laterality: N/A;   THYROIDECTOMY     TUMMY TUCK  SOCIAL HISTORY: Social History   Socioeconomic History   Marital status: Widowed    Spouse name: Not on file   Number of children: Not on file   Years of education: Not on file   Highest education level: Not on file  Occupational History   Not on file  Tobacco Use   Smoking status: Former    Types: Cigarettes    Quit date: 09/25/1983    Years since quitting: 38.7   Smokeless tobacco: Never  Vaping Use   Vaping Use: Never used  Substance and Sexual  Activity   Alcohol use: Yes    Comment: socially history of heavy drinking with intervention x 2-at least 2 occasions since april '14   Drug use: No   Sexual activity: Never  Other Topics Concern   Not on file  Social History Narrative   Not on file   Social Determinants of Health   Financial Resource Strain: Low Risk  (01/22/2020)   Overall Financial Resource Strain (CARDIA)    Difficulty of Paying Living Expenses: Not hard at all  Food Insecurity: No Food Insecurity (01/22/2020)   Hunger Vital Sign    Worried About Running Out of Food in the Last Year: Never true    [place] in the Last Year: Never true  Transportation Needs: No Transportation Needs (01/22/2020)   PRAPARE - Hydrologist (Medical): No    Lack of Transportation (Non-Medical): No  Physical Activity: Insufficiently Active (01/22/2020)   Exercise Vital Sign    Days of Exercise per Week: 5 days    Minutes of Exercise per Session: 20 min  Stress: Not on file  Social Connections: Moderately Isolated (01/22/2020)   Social Connection and Isolation Panel [NHANES]    Frequency of Communication with Friends and Family: More than three times a week    Frequency of Social Gatherings with Friends and Family: Never    Attends Religious Services: Never    Marine scientist or Organizations: Yes    Attends Archivist Meetings: Never    Marital Status: Widowed  Human resources officer Violence: Not on file    FAMILY HISTORY: Family History  Problem Relation Age of Onset   Hypertension Mother    Diabetes Father    Hypertension Father    Hypertension Sister    Dementia Sister    Multiple myeloma Sister    Thyroid cancer Daughter 35   Diabetes Brother    Colon polyps Brother    Atrial fibrillation Brother    Breast cancer Neg Hx     ALLERGIES:  is allergic to penicillins, banana, detrol [tolterodine], quetiapine, risperidone and related, and lisinopril.  MEDICATIONS:   Current Outpatient Medications  Medication Sig Dispense Refill   amLODipine (NORVASC) 5 MG tablet Take 5 mg by mouth at bedtime.     carvedilol (COREG) 6.25 MG tablet Take 6.25 mg by mouth 2 (two) times daily with a meal.     Cholecalciferol (VITAMIN D3) 5000 UNITS TABS Take 5,000 Units by mouth 3 (three) times daily after meals.      ferrous sulfate 324 MG TBEC Take 324 mg by mouth 2 (two) times daily. Takes     furosemide (LASIX) 40 MG tablet Take 1 tablet (40 mg total) by mouth daily. 90 tablet 3   glipiZIDE (GLUCOTROL XL) 2.5 MG 24 hr tablet Take 5 mg by mouth 2 (two) times daily.      insulin glargine, 1 Unit Dial, (  TOUJEO SOLOSTAR) 300 UNIT/ML Solostar Pen 5 Units.     Lancets (FREESTYLE) lancets by Does not apply route.     Levothyroxine Sodium 175 MCG/ML SOLN Take 175 mcg by mouth daily before breakfast.     losartan (COZAAR) 50 MG tablet Take 50 mg by mouth daily.     Melatonin 5 MG CAPS Take 5 mg by mouth See admin instructions. Take 5 mg 2 hours before bedtime     Multiple Vitamins-Minerals (ICAPS AREDS 2 PO) Take 1 tablet by mouth 2 (two) times daily.     omeprazole (PRILOSEC) 20 MG capsule Take 20 mg by mouth daily.     Polyethyl Glycol-Propyl Glycol (SYSTANE OP) Place 1 drop into both eyes every evening.     pramipexole (MIRAPEX) 0.25 MG tablet Take 0.25-0.5 mg by mouth See admin instructions. Take 0.5 mg 2 hours prior to bedtime then 0.25 mg at bedtime     predniSONE (DELTASONE) 10 MG tablet Take 10 mg by mouth daily with breakfast.      raloxifene (EVISTA) 60 MG tablet Take 60 mg by mouth daily.     rosuvastatin (CRESTOR) 5 MG tablet Take 1 tablet (5 mg total) by mouth 2 (two) times a week. Take on Mondays and Thursdays. 30 tablet 3   tacrolimus (PROGRAF) 0.5 MG capsule Take 2 mg by mouth every 12 (twelve) hours.     temazepam (RESTORIL) 15 MG capsule Take 15-30 mg by mouth at bedtime.      vitamin B-12 (CYANOCOBALAMIN) 500 MCG tablet Take 500 mcg by mouth daily.     No  current facility-administered medications for this visit.   PHYSICAL EXAMINATION:  ECOG PERFORMANCE STATUS: 0 - Asymptomatic  There were no vitals filed for this visit.   Physical Exam Constitutional:      Appearance: Normal appearance.  Cardiovascular:     Rate and Rhythm: Normal rate and regular rhythm.     Pulses: Normal pulses.     Heart sounds: Normal heart sounds.  Pulmonary:     Effort: Pulmonary effort is normal.     Breath sounds: Normal breath sounds.  Abdominal:     General: Abdomen is flat. Bowel sounds are normal. There is no distension.     Palpations: Abdomen is soft. There is no mass.  Musculoskeletal:        General: No swelling or tenderness.     Cervical back: Normal range of motion and neck supple.  Neurological:     Mental Status: She is alert.  Psychiatric:        Mood and Affect: Mood normal.     LABORATORY DATA:  I have reviewed the data as listed Lab Results  Component Value Date   WBC 5.5 05/23/2022   HGB 8.8 (L) 05/23/2022   HCT 27.1 (L) 05/23/2022   HCT 27.2 (L) 05/23/2022   MCV 103.0 (H) 05/23/2022   PLT 177 05/23/2022     Chemistry      Component Value Date/Time   NA 135 03/06/2022 1730   NA 137 03/22/2020 1439   NA 140 08/28/2013 0955   K 5.3 (H) 03/06/2022 1730   K 3.0 (LL) 08/28/2013 0955   CL 100 03/06/2022 1730   CL 111 (H) 09/03/2012 1249   CO2 28 03/06/2022 1730   CO2 16 (L) 08/28/2013 0955   BUN 46 (H) 03/06/2022 1730   BUN 42 (H) 03/22/2020 1439   BUN 75.7 (H) 08/28/2013 0955   CREATININE 2.33 (H) 03/06/2022 1730  CREATININE 1.67 (H) 09/28/2020 1432   CREATININE 5.9 (HH) 08/28/2013 0955      Component Value Date/Time   CALCIUM 8.1 (L) 03/06/2022 1730   CALCIUM 7.5 (L) 08/28/2013 0955   ALKPHOS 111 09/28/2020 1432   ALKPHOS 62 08/28/2013 0955   AST 15 09/28/2020 1432   AST 15 08/28/2013 0955   ALT 15 09/28/2020 1432   ALT 17 08/28/2013 0955   BILITOT 0.3 09/28/2020 1432   BILITOT 0.38 08/28/2013 0955      Reviewed labs from [place] done on December 14.  Her white blood cell count of 5.7, hemoglobin is 10.3, platelet count of 210,000, absolute lymphocytes were low and absolute eosinophils were low, otherwise iron panel showed a ferritin of 46 and total iron binding capacity of 326, iron saturation of 25%.   07/21/2021, CBC showed white blood cell count of 6000, hemoglobin of 10, hematocrit of 30.2 and platelet count of 187,000. Iron and iron binding panel normal.  B12 is normal.  01/19/2022 CBC showed hemoglobin of 9.2 g/dL, total white blood cell count of 5.6 and platelet count of 183,000.  Iron panel with increasing total iron binding capacity suggestive of iron deficiency, ferritin pending  We have reviewed labs that were available from February.    RADIOGRAPHIC STUDIES: I have personally reviewed the radiological images as listed and agreed with the findings in the report. No results found.  I spent 30 minutes in the care of this patient including history, review of records, discussion of labs and follow-up recommendations.    Benay Pike, MD 06/14/2022 11:51 AM

## 2022-06-20 ENCOUNTER — Encounter: Payer: Self-pay | Admitting: Hematology and Oncology

## 2022-06-20 ENCOUNTER — Inpatient Hospital Stay (HOSPITAL_BASED_OUTPATIENT_CLINIC_OR_DEPARTMENT_OTHER): Payer: Medicare PPO | Admitting: Hematology and Oncology

## 2022-06-20 VITALS — BP 124/49 | HR 63 | Temp 97.7°F | Resp 16 | Ht 59.0 in | Wt 135.6 lb

## 2022-06-20 DIAGNOSIS — Z7984 Long term (current) use of oral hypoglycemic drugs: Secondary | ICD-10-CM | POA: Diagnosis not present

## 2022-06-20 DIAGNOSIS — D649 Anemia, unspecified: Secondary | ICD-10-CM

## 2022-06-20 DIAGNOSIS — Z794 Long term (current) use of insulin: Secondary | ICD-10-CM | POA: Diagnosis not present

## 2022-06-20 DIAGNOSIS — E039 Hypothyroidism, unspecified: Secondary | ICD-10-CM | POA: Diagnosis not present

## 2022-06-20 DIAGNOSIS — E119 Type 2 diabetes mellitus without complications: Secondary | ICD-10-CM | POA: Diagnosis not present

## 2022-06-20 DIAGNOSIS — E1122 Type 2 diabetes mellitus with diabetic chronic kidney disease: Secondary | ICD-10-CM | POA: Diagnosis not present

## 2022-06-20 DIAGNOSIS — D631 Anemia in chronic kidney disease: Secondary | ICD-10-CM | POA: Diagnosis not present

## 2022-06-20 DIAGNOSIS — Z880 Allergy status to penicillin: Secondary | ICD-10-CM | POA: Diagnosis not present

## 2022-06-20 DIAGNOSIS — I129 Hypertensive chronic kidney disease with stage 1 through stage 4 chronic kidney disease, or unspecified chronic kidney disease: Secondary | ICD-10-CM | POA: Diagnosis not present

## 2022-06-20 DIAGNOSIS — Z87891 Personal history of nicotine dependence: Secondary | ICD-10-CM | POA: Diagnosis not present

## 2022-06-20 DIAGNOSIS — Z940 Kidney transplant status: Secondary | ICD-10-CM | POA: Diagnosis not present

## 2022-06-20 DIAGNOSIS — Z853 Personal history of malignant neoplasm of breast: Secondary | ICD-10-CM | POA: Diagnosis not present

## 2022-06-20 DIAGNOSIS — N1832 Chronic kidney disease, stage 3b: Secondary | ICD-10-CM | POA: Diagnosis not present

## 2022-06-20 DIAGNOSIS — Z8521 Personal history of malignant neoplasm of larynx: Secondary | ICD-10-CM | POA: Diagnosis not present

## 2022-06-20 DIAGNOSIS — Z79899 Other long term (current) drug therapy: Secondary | ICD-10-CM | POA: Diagnosis not present

## 2022-06-20 DIAGNOSIS — D84821 Immunodeficiency due to drugs: Secondary | ICD-10-CM | POA: Diagnosis not present

## 2022-06-20 DIAGNOSIS — Z8719 Personal history of other diseases of the digestive system: Secondary | ICD-10-CM | POA: Diagnosis not present

## 2022-06-20 DIAGNOSIS — Z87442 Personal history of urinary calculi: Secondary | ICD-10-CM | POA: Diagnosis not present

## 2022-06-20 NOTE — Progress Notes (Signed)
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  She had a telemedicine visit  with Dr Jacolyn Reedy her nephrologist from [place].  It appears that she is due for some kidney workup.  She appears to have progressive worsening of her kidney disease which may be the reason why she is anemic.  She is due for some follow-up labs.  If her kidney disease indeed is worsening, we can explain the anemia is anemia of chronic kidney disease.  If her kidney numbers are improving, then it may be reasonable to move forward with bone marrow aspiration and biopsy.  She is due for repeat imaging in May 2024.  If she does not have any evidence of progression, then we can consider erythropoietin.  At this time we do not recommend doing erythropoietin if she has an active solid malignancy.  HISTORY OF PRESENTING ILLNESS:   [facility] Pollard 82 y.o. female is here because of anemia.  This is a very pleasant 82 year old female patient with past medical history significant for right-sided breast cancer, cancer of the supraglottis status postradiation, type 2 diabetes, hypertension, kidney transplant referred to hematology for evaluation of slowly progressive anemia. She is here for follow-up.  Since her last visit here she is scheduled to do  her bone marrow on April 8 however she wondered if she even has to go through this procedure or just rather proceed with the growth factor injections as recommended by her nephrologist.  She also has a lot going in her family, daughter is going through metastatic thyroid cancer.    She had a few questions regarding adjusting her medications for blood pressure and Lasix since she has gained about 5 pounds of water weight.   MEDICAL HISTORY:  Past Medical History:  Diagnosis Date   Alcohol abuse    Anemia    Arthritis    Breast cancer ([place]) 2010   Right   Chronic kidney disease    Colon polyp    Depression    Diabetes mellitus    GERD (gastroesophageal reflux disease)    Heart murmur    History of kidney stones 1976   HTN (hypertension)    Hypothyroidism    Osteopenia    Pancreatitis    Ulcerative esophagitis     SURGICAL HISTORY: Past Surgical History:  Procedure Laterality Date   ABDOMINAL HYSTERECTOMY     ABDOMINOPLASTY     APPENDECTOMY     BREAST BIOPSY Right 08/03/2008   Stereo Bx, Malignant   BREAST LUMPECTOMY Right 08/26/2008   CATARACT EXTRACTION W/ INTRAOCULAR LENS  IMPLANT, BILATERAL     CO2 LASER APPLICATION N/A [id number]   Procedure: CO2 LASER APPLICATION;  Surgeon: Melida Quitter, MD;  Location: [place];  Service: ENT;  Laterality: N/A;   COSMETIC SURGERY     ductal carcinoma     right breast    EYE SURGERY     HIP ARTHROPLASTY Right  INSERTION OF DIALYSIS CATHETER Right 09/25/2012   Procedure: INSERTION OF DIALYSIS CATHETER;  Surgeon: Mal Misty, MD;  Location: [place];  Service: Vascular;  Laterality: Right;  Righ Internal Jugular Placement   IR GASTROSTOMY TUBE MOD SED  02/10/2020   IR GASTROSTOMY TUBE REMOVAL  05/25/2020   JOINT REPLACEMENT     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   MICROLARYNGOSCOPY N/A 10/20/2019   Procedure: Suspended Microlaryngoscopy with Biopsy;  Surgeon: Melida Quitter, MD;  Location: [place];  Service: ENT;  Laterality: N/A;    MICROLARYNGOSCOPY N/A 01/09/2020   Procedure: MICROLARYNGOSCOPY WITH BIOPSY;  Surgeon: Melida Quitter, MD;  Location: [place];  Service: ENT;  Laterality: N/A;   RIGID ESOPHAGOSCOPY N/A 10/20/2019   Procedure: RIGID ESOPHAGOSCOPY;  Surgeon: Melida Quitter, MD;  Location: [place];  Service: ENT;  Laterality: N/A;   THYROIDECTOMY     TUMMY TUCK      SOCIAL HISTORY: Social History   Socioeconomic History   Marital status: Widowed    Spouse name: Not on file   Number of children: Not on file   Years of education: Not on file   Highest education level: Not on file  Occupational History   Not on file  Tobacco Use   Smoking status: Former    Types: Cigarettes    Quit date: 09/25/1983    Years since quitting: 38.7   Smokeless tobacco: Never  Vaping Use   Vaping Use: Never used  Substance and Sexual Activity   Alcohol use: Yes    Comment: socially history of heavy drinking with intervention x 2-at least 2 occasions since april '14   Drug use: No   Sexual activity: Never  Other Topics Concern   Not on file  Social History Narrative   Not on file   Social Determinants of Health   Financial Resource Strain: Low Risk  (01/22/2020)   Overall Financial Resource Strain (CARDIA)    Difficulty of Paying Living Expenses: Not hard at all  Food Insecurity: No Food Insecurity (01/22/2020)   Hunger Vital Sign    Worried About Running Out of Food in the Last Year: Never true    [place] in the Last Year: Never true  Transportation Needs: No Transportation Needs (01/22/2020)   PRAPARE - Hydrologist (Medical): No    Lack of Transportation (Non-Medical): No  Physical Activity: Insufficiently Active (01/22/2020)   Exercise Vital Sign    Days of Exercise per Week: 5 days    Minutes of Exercise per Session: 20 min  Stress: Not on file  Social Connections: Moderately Isolated (01/22/2020)   Social Connection and Isolation Panel [NHANES]    Frequency of  Communication with Friends and Family: More than three times a week    Frequency of Social Gatherings with Friends and Family: Never    Attends Religious Services: Never    Marine scientist or Organizations: Yes    Attends Archivist Meetings: Never    Marital Status: Widowed  Human resources officer Violence: Not on file    FAMILY HISTORY: Family History  Problem Relation Age of Onset   Hypertension Mother    Diabetes Father    Hypertension Father    Hypertension Sister    Dementia Sister    Multiple myeloma Sister    Thyroid cancer Daughter 39   Diabetes Brother    Colon polyps Brother    Atrial fibrillation Brother    Breast cancer Neg Hx  ALLERGIES:  is allergic to penicillins, banana, detrol [tolterodine], quetiapine, risperidone and related, and lisinopril.  MEDICATIONS:  Current Outpatient Medications  Medication Sig Dispense Refill   amLODipine (NORVASC) 5 MG tablet Take 5 mg by mouth at bedtime.     carvedilol (COREG) 6.25 MG tablet Take 6.25 mg by mouth 2 (two) times daily with a meal.     Cholecalciferol (VITAMIN D3) 5000 UNITS TABS Take 5,000 Units by mouth 3 (three) times daily after meals.      ferrous sulfate 324 MG TBEC Take 324 mg by mouth 2 (two) times daily. Takes     furosemide (LASIX) 40 MG tablet Take 1 tablet (40 mg total) by mouth daily. 90 tablet 3   glipiZIDE (GLUCOTROL XL) 2.5 MG 24 hr tablet Take 5 mg by mouth 2 (two) times daily.      insulin glargine, 1 Unit Dial, (TOUJEO SOLOSTAR) 300 UNIT/ML Solostar Pen 5 Units.     Lancets (FREESTYLE) lancets by Does not apply route.     Levothyroxine Sodium 175 MCG/ML SOLN Take 175 mcg by mouth daily before breakfast.     losartan (COZAAR) 50 MG tablet Take 50 mg by mouth daily.     Melatonin 5 MG CAPS Take 5 mg by mouth See admin instructions. Take 5 mg 2 hours before bedtime     Multiple Vitamins-Minerals (ICAPS AREDS 2 PO) Take 1 tablet by mouth 2 (two) times daily.     omeprazole (PRILOSEC)  20 MG capsule Take 20 mg by mouth daily.     Polyethyl Glycol-Propyl Glycol (SYSTANE OP) Place 1 drop into both eyes every evening.     pramipexole (MIRAPEX) 0.25 MG tablet Take 0.25-0.5 mg by mouth See admin instructions. Take 0.5 mg 2 hours prior to bedtime then 0.25 mg at bedtime     predniSONE (DELTASONE) 10 MG tablet Take 5 mg by mouth daily with breakfast.     raloxifene (EVISTA) 60 MG tablet Take 60 mg by mouth daily.     rosuvastatin (CRESTOR) 5 MG tablet Take 1 tablet (5 mg total) by mouth 2 (two) times a week. Take on Mondays and Thursdays. 30 tablet 3   tacrolimus (PROGRAF) 0.5 MG capsule Take 2 mg by mouth every 12 (twelve) hours.     temazepam (RESTORIL) 15 MG capsule Take 15-30 mg by mouth at bedtime.      vitamin B-12 (CYANOCOBALAMIN) 500 MCG tablet Take 500 mcg by mouth daily.     No current facility-administered medications for this visit.   PHYSICAL EXAMINATION:  ECOG PERFORMANCE STATUS: 0 - Asymptomatic  There were no vitals filed for this visit.  Physical exam deferred today in lieu of counseling   LABORATORY DATA:  I have reviewed the data as listed Lab Results  Component Value Date   WBC 5.5 05/23/2022   HGB 8.8 (L) 05/23/2022   HCT 27.1 (L) 05/23/2022   HCT 27.2 (L) 05/23/2022   MCV 103.0 (H) 05/23/2022   PLT 177 05/23/2022     Chemistry      Component Value Date/Time   NA 135 03/06/2022 1730   NA 137 03/22/2020 1439   NA 140 08/28/2013 0955   K 5.3 (H) 03/06/2022 1730   K 3.0 (LL) 08/28/2013 0955   CL 100 03/06/2022 1730   CL 111 (H) 09/03/2012 1249   CO2 28 03/06/2022 1730   CO2 16 (L) 08/28/2013 0955   BUN 46 (H) 03/06/2022 1730   BUN 42 (H) 03/22/2020 1439   BUN  75.7 (H) 08/28/2013 0955   CREATININE 2.33 (H) 03/06/2022 1730   CREATININE 1.67 (H) 09/28/2020 1432   CREATININE 5.9 (HH) 08/28/2013 0955      Component Value Date/Time   CALCIUM 8.1 (L) 03/06/2022 1730   CALCIUM 7.5 (L) 08/28/2013 0955   ALKPHOS 111 09/28/2020 1432   ALKPHOS  62 08/28/2013 0955   AST 15 09/28/2020 1432   AST 15 08/28/2013 0955   ALT 15 09/28/2020 1432   ALT 17 08/28/2013 0955   BILITOT 0.3 09/28/2020 1432   BILITOT 0.38 08/28/2013 0955     Reviewed labs from [place] done on December 14.  Her white blood cell count of 5.7, hemoglobin is 10.3, platelet count of 210,000, absolute lymphocytes were low and absolute eosinophils were low, otherwise iron panel showed a ferritin of 46 and total iron binding capacity of 326, iron saturation of 25%.   07/21/2021, CBC showed white blood cell count of 6000, hemoglobin of 10, hematocrit of 30.2 and platelet count of 187,000. Iron and iron binding panel normal.  B12 is normal.  01/19/2022 CBC showed hemoglobin of 9.2 g/dL, total white blood cell count of 5.6 and platelet count of 183,000.  Iron panel with increasing total iron binding capacity suggestive of iron deficiency, ferritin pending  We have reviewed labs that were available from February.    RADIOGRAPHIC STUDIES: I have personally reviewed the radiological images as listed and agreed with the findings in the report. No results found.  I spent 30 minutes in the care of this patient including history, review of records, discussion of labs and follow-up recommendations.    Benay Pike, MD 06/20/2022 12:56 PM

## 2022-06-21 ENCOUNTER — Ambulatory Visit (HOSPITAL_BASED_OUTPATIENT_CLINIC_OR_DEPARTMENT_OTHER): Payer: Medicare PPO | Admitting: Hematology and Oncology

## 2022-06-21 ENCOUNTER — Telehealth: Payer: Self-pay | Admitting: Hematology and Oncology

## 2022-06-21 DIAGNOSIS — I10 Essential (primary) hypertension: Secondary | ICD-10-CM | POA: Diagnosis not present

## 2022-06-21 DIAGNOSIS — N1832 Chronic kidney disease, stage 3b: Secondary | ICD-10-CM | POA: Diagnosis not present

## 2022-06-21 DIAGNOSIS — Z940 Kidney transplant status: Secondary | ICD-10-CM | POA: Diagnosis not present

## 2022-06-21 DIAGNOSIS — D649 Anemia, unspecified: Secondary | ICD-10-CM | POA: Diagnosis not present

## 2022-06-21 NOTE — Telephone Encounter (Signed)
Spoke with patient confirming upcoming appointment  

## 2022-06-22 ENCOUNTER — Telehealth: Payer: Self-pay

## 2022-06-22 NOTE — Progress Notes (Signed)
[place] CONSULT NOTE  Patient Care Team: Lawerance Cruel, MD as PCP - General (Family Medicine) Lawerance Cruel, MD as Attending Physician (Family Medicine) Eppie Gibson, MD as Consulting Physician (Radiation Oncology) Malmfelt, Stephani Police, RN as Oncology Nurse Navigator Jerline Pain, MD as Consulting Physician (Cardiology)  CHIEF COMPLAINTS/PURPOSE OF CONSULTATION:  Normocytic normochromic anemia   ASSESSMENT & PLAN:   This is a very pleasant 82 year old female patient with past medical history significant for diabetes, hypertension, breast cancer, malignant neoplasm of supraglottis, 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 They were ok with epo if there is contraindication such as solid tumors/active disease. I recommended that we cancel BMB, wait for May ( she is due to repeat CT chest) If no concern for active disease or progression, then I can consider starting her on EPO.   HISTORY OF PRESENTING ILLNESS:   Anita Pollard 82 y.o. female is here because of anemia.  This is a very pleasant 82 year old female patient with past medical history significant for right-sided breast cancer, cancer of the supraglottis status postradiation, type 2 diabetes, hypertension, kidney transplant referred to hematology for evaluation of slowly progressive anemia. She is here for telephone visit. She had some additional blood work with her nephrologist, hence made this appt to review recommendations.     MEDICAL HISTORY:  Past Medical History:  Diagnosis Date   Alcohol abuse    Anemia    Arthritis    Breast cancer ([place]) 2010   Right   Chronic kidney disease    Colon polyp    Depression    Diabetes mellitus    GERD (gastroesophageal reflux disease)    Heart murmur    History of kidney  stones 1976   HTN (hypertension)    Hypothyroidism    Osteopenia    Pancreatitis    Ulcerative esophagitis     SURGICAL HISTORY: Past Surgical History:  Procedure Laterality Date   ABDOMINAL HYSTERECTOMY     ABDOMINOPLASTY     APPENDECTOMY     BREAST BIOPSY Right 08/03/2008   Stereo Bx, Malignant   BREAST LUMPECTOMY Right 08/26/2008   CATARACT EXTRACTION W/ INTRAOCULAR LENS  IMPLANT, BILATERAL     CO2 LASER APPLICATION N/A [id number]   Procedure: CO2 LASER APPLICATION;  Surgeon: Melida Quitter, MD;  Location[facility];  Service: ENT;  Laterality: N/A;   COSMETIC SURGERY     ductal carcinoma     right breast    EYE SURGERY     HIP ARTHROPLASTY Right    INSERTION OF DIALYSIS CATHETER Right 09/25/2012   Procedure: INSERTION OF DIALYSIS CATHETER;  Surgeon: Mal Misty, MD;  Location: [place];  Service: Vascular;  Laterality: Right;  Righ Internal Jugular Placement   IR GASTROSTOMY TUBE MOD SED  02/10/2020   IR GASTROSTOMY TUBE REMOVAL  05/25/2020   JOINT REPLACEMENT     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   MICROLARYNGOSCOPY N/A 10/20/2019   Procedure: Suspended Microlaryngoscopy with Biopsy;  Surgeon: Melida Quitter, MD;  Location: [place];  Service: ENT;  Laterality: N/A;   MICROLARYNGOSCOPY N/A 01/09/2020   Procedure: MICROLARYNGOSCOPY WITH BIOPSY;  Surgeon: Melida Quitter, MD;  Location: [place];  Service: ENT;  Laterality: N/A;   RIGID ESOPHAGOSCOPY N/A 10/20/2019   Procedure: RIGID ESOPHAGOSCOPY;  Surgeon: Melida Quitter, MD;  Location: [facility] OR;  Service: ENT;  Laterality: N/A;   THYROIDECTOMY     TUMMY TUCK      SOCIAL HISTORY: Social History   Socioeconomic History   Marital status: Widowed    Spouse name: Not on file   Number of children: Not on file   Years of education: Not on file   Highest education level: Not on file  Occupational History   Not on file  Tobacco Use   Smoking status: Former    Types: Cigarettes    Quit date: 09/25/1983    Years since quitting: 38.7    Smokeless tobacco: Never  Vaping Use   Vaping Use: Never used  Substance and Sexual Activity   Alcohol use: Yes    Comment: socially history of heavy drinking with intervention x 2-at least 2 occasions since april '14   Drug use: No   Sexual activity: Never  Other Topics Concern   Not on file  Social History Narrative   Not on file   Social Determinants of Health   Financial Resource Strain: Low Risk  (01/22/2020)   Overall Financial Resource Strain (CARDIA)    Difficulty of Paying Living Expenses: Not hard at all  Food Insecurity: No Food Insecurity (01/22/2020)   Hunger Vital Sign    Worried About Running Out of Food in the Last Year: Never true    [place] in the Last Year: Never true  Transportation Needs: No Transportation Needs (01/22/2020)   PRAPARE - Hydrologist (Medical): No    Lack of Transportation (Non-Medical): No  Physical Activity: Insufficiently Active (01/22/2020)   Exercise Vital Sign    Days of Exercise per Week: 5 days    Minutes of Exercise per Session: 20 min  Stress: Not on file  Social Connections: Moderately Isolated (01/22/2020)   Social Connection and Isolation Panel [NHANES]    Frequency of Communication with Friends and Family: More than three times a week    Frequency of Social Gatherings with Friends and Family: Never    Attends Religious Services: Never    Marine scientist or Organizations: Yes    Attends Archivist Meetings: Never    Marital Status: Widowed  Human resources officer Violence: Not on file    FAMILY HISTORY: Family History  Problem Relation Age of Onset   Hypertension Mother    Diabetes Father    Hypertension Father    Hypertension Sister    Dementia Sister    Multiple myeloma Sister    Thyroid cancer Daughter 13   Diabetes Brother    Colon polyps Brother    Atrial fibrillation Brother    Breast cancer Neg Hx     ALLERGIES:  is allergic to penicillins, banana, detrol  [tolterodine], quetiapine, risperidone and related, and lisinopril.  MEDICATIONS:  Current Outpatient Medications  Medication Sig Dispense Refill   amLODipine (NORVASC) 5 MG tablet Take 5 mg by mouth at bedtime.     carvedilol (COREG) 6.25 MG tablet Take 6.25 mg by mouth 2 (two) times daily with a meal.     Cholecalciferol (VITAMIN D3) 5000 UNITS TABS Take 5,000 Units by mouth 3 (three) times daily after meals.      ferrous sulfate 324 MG TBEC Take 324 mg by mouth 2 (two) times daily. Takes     furosemide (LASIX) 40 MG tablet Take 1 tablet (40 mg total) by mouth daily. 90 tablet 3   glipiZIDE (GLUCOTROL XL) 2.5  MG 24 hr tablet Take 5 mg by mouth 2 (two) times daily.      insulin glargine, 1 Unit Dial, (TOUJEO SOLOSTAR) 300 UNIT/ML Solostar Pen 5 Units.     Lancets (FREESTYLE) lancets by Does not apply route.     Levothyroxine Sodium 175 MCG/ML SOLN Take 175 mcg by mouth daily before breakfast.     losartan (COZAAR) 50 MG tablet Take 50 mg by mouth daily.     Melatonin 5 MG CAPS Take 5 mg by mouth See admin instructions. Take 5 mg 2 hours before bedtime     Multiple Vitamins-Minerals (ICAPS AREDS 2 PO) Take 1 tablet by mouth 2 (two) times daily.     omeprazole (PRILOSEC) 20 MG capsule Take 20 mg by mouth daily.     Polyethyl Glycol-Propyl Glycol (SYSTANE OP) Place 1 drop into both eyes every evening.     pramipexole (MIRAPEX) 0.25 MG tablet Take 0.25-0.5 mg by mouth See admin instructions. Take 0.5 mg 2 hours prior to bedtime then 0.25 mg at bedtime     predniSONE (DELTASONE) 10 MG tablet Take 5 mg by mouth daily with breakfast.     raloxifene (EVISTA) 60 MG tablet Take 60 mg by mouth daily.     rosuvastatin (CRESTOR) 5 MG tablet Take 1 tablet (5 mg total) by mouth 2 (two) times a week. Take on Mondays and Thursdays. 30 tablet 3   tacrolimus (PROGRAF) 0.5 MG capsule Take 2 mg by mouth every 12 (twelve) hours.     temazepam (RESTORIL) 15 MG capsule Take 15-30 mg by mouth at bedtime.       vitamin B-12 (CYANOCOBALAMIN) 500 MCG tablet Take 500 mcg by mouth daily.     No current facility-administered medications for this visit.   PHYSICAL EXAMINATION:  ECOG PERFORMANCE STATUS: 0 - Asymptomatic  There were no vitals filed for this visit.  Physical exam deferred today   LABORATORY DATA:  I have reviewed the data as listed Lab Results  Component Value Date   WBC 5.5 05/23/2022   HGB 8.8 (L) 05/23/2022   HCT 27.1 (L) 05/23/2022   HCT 27.2 (L) 05/23/2022   MCV 103.0 (H) 05/23/2022   PLT 177 05/23/2022     Chemistry      Component Value Date/Time   NA 135 03/06/2022 1730   NA 137 03/22/2020 1439   NA 140 08/28/2013 0955   K 5.3 (H) 03/06/2022 1730   K 3.0 (LL) 08/28/2013 0955   CL 100 03/06/2022 1730   CL 111 (H) 09/03/2012 1249   CO2 28 03/06/2022 1730   CO2 16 (L) 08/28/2013 0955   BUN 46 (H) 03/06/2022 1730   BUN 42 (H) 03/22/2020 1439   BUN 75.7 (H) 08/28/2013 0955   CREATININE 2.33 (H) 03/06/2022 1730   CREATININE 1.67 (H) 09/28/2020 1432   CREATININE 5.9 (HH) 08/28/2013 0955      Component Value Date/Time   CALCIUM 8.1 (L) 03/06/2022 1730   CALCIUM 7.5 (L) 08/28/2013 0955   ALKPHOS 111 09/28/2020 1432   ALKPHOS 62 08/28/2013 0955   AST 15 09/28/2020 1432   AST 15 08/28/2013 0955   ALT 15 09/28/2020 1432   ALT 17 08/28/2013 0955   BILITOT 0.3 09/28/2020 1432   BILITOT 0.38 08/28/2013 0955     Reviewed labs from [place] done on December 14.  Her white blood cell count of 5.7, hemoglobin is 10.3, platelet count of 210,000, absolute lymphocytes were low and absolute eosinophils were low, otherwise iron  panel showed a ferritin of 46 and total iron binding capacity of 326, iron saturation of 25%.   07/21/2021, CBC showed white blood cell count of 6000, hemoglobin of 10, hematocrit of 30.2 and platelet count of 187,000. Iron and iron binding panel normal.  B12 is normal.  01/19/2022 CBC showed hemoglobin of 9.2 g/dL, total white blood cell count of 5.6  and platelet count of 183,000.  Iron panel with increasing total iron binding capacity suggestive of iron deficiency, ferritin pending  I spoke with Dr Jacolyn Reedy and he suggested she has CKD 4, ferritin 33. I couldn't see the labs in care everywhere.  RADIOGRAPHIC STUDIES: I have personally reviewed the radiological images as listed and agreed with the findings in the report. No results found.  I spent 8 minutes in the care of this patient including history, review of records, discussion of labs and follow-up recommendations.  I connected with  Charlann Lange on 06/22/22 by a telephone application and verified that I am speaking with the correct person using two identifiers.   I discussed the limitations of evaluation and management by telemedicine. The patient expressed understanding and agreed to proceed.     Benay Pike, MD 06/22/2022 4:38 PM

## 2022-06-22 NOTE — Telephone Encounter (Signed)
Called Pt per Dr. Chryl Heck request, cancelled bone marrow bx scheduled for 07/03/22. Pt verbalized understanding.

## 2022-06-27 ENCOUNTER — Other Ambulatory Visit (HOSPITAL_COMMUNITY): Payer: Medicare PPO

## 2022-06-27 DIAGNOSIS — Z961 Presence of intraocular lens: Secondary | ICD-10-CM | POA: Diagnosis not present

## 2022-06-27 DIAGNOSIS — E113293 Type 2 diabetes mellitus with mild nonproliferative diabetic retinopathy without macular edema, bilateral: Secondary | ICD-10-CM | POA: Diagnosis not present

## 2022-06-27 DIAGNOSIS — H353132 Nonexudative age-related macular degeneration, bilateral, intermediate dry stage: Secondary | ICD-10-CM | POA: Diagnosis not present

## 2022-06-29 ENCOUNTER — Inpatient Hospital Stay (HOSPITAL_COMMUNITY)
Admission: EM | Admit: 2022-06-29 | Discharge: 2022-07-02 | DRG: 291 | Disposition: A | Discharge status: Home / Self Care | Payer: Medicare PPO | Attending: Internal Medicine | Admitting: Internal Medicine

## 2022-06-29 ENCOUNTER — Telehealth: Payer: Self-pay | Admitting: Cardiology

## 2022-06-29 ENCOUNTER — Other Ambulatory Visit: Payer: Self-pay

## 2022-06-29 ENCOUNTER — Encounter (HOSPITAL_COMMUNITY): Payer: Self-pay | Admitting: Pharmacy Technician

## 2022-06-29 ENCOUNTER — Emergency Department (HOSPITAL_COMMUNITY): Payer: Medicare PPO

## 2022-06-29 DIAGNOSIS — B9620 Unspecified Escherichia coli [E. coli] as the cause of diseases classified elsewhere: Secondary | ICD-10-CM | POA: Diagnosis present

## 2022-06-29 DIAGNOSIS — I10 Essential (primary) hypertension: Secondary | ICD-10-CM | POA: Diagnosis present

## 2022-06-29 DIAGNOSIS — Z8249 Family history of ischemic heart disease and other diseases of the circulatory system: Secondary | ICD-10-CM | POA: Diagnosis not present

## 2022-06-29 DIAGNOSIS — I2510 Atherosclerotic heart disease of native coronary artery without angina pectoris: Secondary | ICD-10-CM | POA: Diagnosis present

## 2022-06-29 DIAGNOSIS — Z79890 Hormone replacement therapy (postmenopausal): Secondary | ICD-10-CM | POA: Diagnosis not present

## 2022-06-29 DIAGNOSIS — I5021 Acute systolic (congestive) heart failure: Secondary | ICD-10-CM | POA: Diagnosis not present

## 2022-06-29 DIAGNOSIS — Z79899 Other long term (current) drug therapy: Secondary | ICD-10-CM

## 2022-06-29 DIAGNOSIS — Z923 Personal history of irradiation: Secondary | ICD-10-CM | POA: Diagnosis not present

## 2022-06-29 DIAGNOSIS — Z7952 Long term (current) use of systemic steroids: Secondary | ICD-10-CM

## 2022-06-29 DIAGNOSIS — E890 Postprocedural hypothyroidism: Secondary | ICD-10-CM | POA: Diagnosis present

## 2022-06-29 DIAGNOSIS — E1122 Type 2 diabetes mellitus with diabetic chronic kidney disease: Secondary | ICD-10-CM | POA: Diagnosis present

## 2022-06-29 DIAGNOSIS — R609 Edema, unspecified: Secondary | ICD-10-CM | POA: Diagnosis not present

## 2022-06-29 DIAGNOSIS — E119 Type 2 diabetes mellitus without complications: Secondary | ICD-10-CM

## 2022-06-29 DIAGNOSIS — I779 Disorder of arteries and arterioles, unspecified: Secondary | ICD-10-CM | POA: Diagnosis present

## 2022-06-29 DIAGNOSIS — I509 Heart failure, unspecified: Secondary | ICD-10-CM | POA: Diagnosis not present

## 2022-06-29 DIAGNOSIS — N179 Acute kidney failure, unspecified: Secondary | ICD-10-CM | POA: Diagnosis present

## 2022-06-29 DIAGNOSIS — Z87442 Personal history of urinary calculi: Secondary | ICD-10-CM

## 2022-06-29 DIAGNOSIS — M8580 Other specified disorders of bone density and structure, unspecified site: Secondary | ICD-10-CM | POA: Diagnosis present

## 2022-06-29 DIAGNOSIS — I110 Hypertensive heart disease with heart failure: Secondary | ICD-10-CM | POA: Diagnosis not present

## 2022-06-29 DIAGNOSIS — R600 Localized edema: Secondary | ICD-10-CM | POA: Diagnosis present

## 2022-06-29 DIAGNOSIS — E876 Hypokalemia: Secondary | ICD-10-CM | POA: Diagnosis not present

## 2022-06-29 DIAGNOSIS — N390 Urinary tract infection, site not specified: Secondary | ICD-10-CM | POA: Diagnosis present

## 2022-06-29 DIAGNOSIS — Z8719 Personal history of other diseases of the digestive system: Secondary | ICD-10-CM

## 2022-06-29 DIAGNOSIS — Z83719 Family history of colon polyps, unspecified: Secondary | ICD-10-CM

## 2022-06-29 DIAGNOSIS — K219 Gastro-esophageal reflux disease without esophagitis: Secondary | ICD-10-CM | POA: Diagnosis present

## 2022-06-29 DIAGNOSIS — J90 Pleural effusion, not elsewhere classified: Secondary | ICD-10-CM | POA: Diagnosis not present

## 2022-06-29 DIAGNOSIS — D649 Anemia, unspecified: Secondary | ICD-10-CM | POA: Diagnosis present

## 2022-06-29 DIAGNOSIS — Z87891 Personal history of nicotine dependence: Secondary | ICD-10-CM

## 2022-06-29 DIAGNOSIS — Z888 Allergy status to other drugs, medicaments and biological substances status: Secondary | ICD-10-CM

## 2022-06-29 DIAGNOSIS — Z833 Family history of diabetes mellitus: Secondary | ICD-10-CM

## 2022-06-29 DIAGNOSIS — N184 Chronic kidney disease, stage 4 (severe): Secondary | ICD-10-CM | POA: Diagnosis not present

## 2022-06-29 DIAGNOSIS — Y830 Surgical operation with transplant of whole organ as the cause of abnormal reaction of the patient, or of later complication, without mention of misadventure at the time of the procedure: Secondary | ICD-10-CM | POA: Diagnosis present

## 2022-06-29 DIAGNOSIS — Z853 Personal history of malignant neoplasm of breast: Secondary | ICD-10-CM

## 2022-06-29 DIAGNOSIS — Z91018 Allergy to other foods: Secondary | ICD-10-CM

## 2022-06-29 DIAGNOSIS — I700 Atherosclerosis of aorta: Secondary | ICD-10-CM | POA: Diagnosis present

## 2022-06-29 DIAGNOSIS — I130 Hypertensive heart and chronic kidney disease with heart failure and stage 1 through stage 4 chronic kidney disease, or unspecified chronic kidney disease: Principal | ICD-10-CM | POA: Diagnosis present

## 2022-06-29 DIAGNOSIS — Z79621 Long term (current) use of calcineurin inhibitor: Secondary | ICD-10-CM | POA: Diagnosis not present

## 2022-06-29 DIAGNOSIS — I5033 Acute on chronic diastolic (congestive) heart failure: Secondary | ICD-10-CM | POA: Diagnosis present

## 2022-06-29 DIAGNOSIS — Z6828 Body mass index (BMI) 28.0-28.9, adult: Secondary | ICD-10-CM | POA: Diagnosis not present

## 2022-06-29 DIAGNOSIS — Z7984 Long term (current) use of oral hypoglycemic drugs: Secondary | ICD-10-CM | POA: Diagnosis not present

## 2022-06-29 DIAGNOSIS — Z794 Long term (current) use of insulin: Secondary | ICD-10-CM

## 2022-06-29 DIAGNOSIS — N1832 Chronic kidney disease, stage 3b: Secondary | ICD-10-CM | POA: Diagnosis present

## 2022-06-29 DIAGNOSIS — Z807 Family history of other malignant neoplasms of lymphoid, hematopoietic and related tissues: Secondary | ICD-10-CM

## 2022-06-29 DIAGNOSIS — Z808 Family history of malignant neoplasm of other organs or systems: Secondary | ICD-10-CM

## 2022-06-29 DIAGNOSIS — E7800 Pure hypercholesterolemia, unspecified: Secondary | ICD-10-CM | POA: Diagnosis present

## 2022-06-29 DIAGNOSIS — I5030 Unspecified diastolic (congestive) heart failure: Secondary | ICD-10-CM | POA: Diagnosis not present

## 2022-06-29 DIAGNOSIS — Z86010 Personal history of colonic polyps: Secondary | ICD-10-CM

## 2022-06-29 DIAGNOSIS — Z961 Presence of intraocular lens: Secondary | ICD-10-CM | POA: Diagnosis present

## 2022-06-29 DIAGNOSIS — T8619 Other complication of kidney transplant: Secondary | ICD-10-CM | POA: Diagnosis present

## 2022-06-29 DIAGNOSIS — Z880 Allergy status to penicillin: Secondary | ICD-10-CM | POA: Diagnosis not present

## 2022-06-29 DIAGNOSIS — E1169 Type 2 diabetes mellitus with other specified complication: Secondary | ICD-10-CM

## 2022-06-29 DIAGNOSIS — Z85118 Personal history of other malignant neoplasm of bronchus and lung: Secondary | ICD-10-CM

## 2022-06-29 DIAGNOSIS — Z90710 Acquired absence of both cervix and uterus: Secondary | ICD-10-CM

## 2022-06-29 DIAGNOSIS — Z96641 Presence of right artificial hip joint: Secondary | ICD-10-CM | POA: Diagnosis present

## 2022-06-29 LAB — COMPREHENSIVE METABOLIC PANEL
ALT: 25 U/L (ref 0–44)
AST: 26 U/L (ref 15–41)
Albumin: 3.7 g/dL (ref 3.5–5.0)
Alkaline Phosphatase: 72 U/L (ref 38–126)
Anion gap: 9 (ref 5–15)
BUN: 46 mg/dL — ABNORMAL HIGH (ref 8–23)
CO2: 23 mmol/L (ref 22–32)
Calcium: 8.2 mg/dL — ABNORMAL LOW (ref 8.9–10.3)
Chloride: 105 mmol/L (ref 98–111)
Creatinine, Ser: 2.16 mg/dL — ABNORMAL HIGH (ref 0.44–1.00)
GFR, Estimated: 22 mL/min — ABNORMAL LOW (ref >60)
Glucose, Bld: 284 mg/dL — ABNORMAL HIGH (ref 70–99)
Potassium: 5.1 mmol/L (ref 3.5–5.1)
Sodium: 137 mmol/L (ref 135–145)
Total Bilirubin: 0.7 mg/dL (ref 0.3–1.2)
Total Protein: 6.4 g/dL — ABNORMAL LOW (ref 6.5–8.1)

## 2022-06-29 LAB — CBC WITH DIFFERENTIAL/PLATELET
Abs Immature Granulocytes: 0.02 10*3/uL (ref 0.00–0.07)
Basophils Absolute: 0 10*3/uL (ref 0.0–0.1)
Basophils Relative: 0 %
Eosinophils Absolute: 0 10*3/uL (ref 0.0–0.5)
Eosinophils Relative: 1 %
HCT: 27.3 % — ABNORMAL LOW (ref 36.0–46.0)
Hemoglobin: 8.5 g/dL — ABNORMAL LOW (ref 12.0–15.0)
Immature Granulocytes: 0 %
Lymphocytes Relative: 15 %
Lymphs Abs: 0.7 10*3/uL (ref 0.7–4.0)
MCH: 33.5 pg (ref 26.0–34.0)
MCHC: 31.1 g/dL (ref 30.0–36.0)
MCV: 107.5 fL — ABNORMAL HIGH (ref 80.0–100.0)
Monocytes Absolute: 0.3 10*3/uL (ref 0.1–1.0)
Monocytes Relative: 5 %
Neutro Abs: 3.6 10*3/uL (ref 1.7–7.7)
Neutrophils Relative %: 79 %
Platelets: 145 10*3/uL — ABNORMAL LOW (ref 150–400)
RBC: 2.54 MIL/uL — ABNORMAL LOW (ref 3.87–5.11)
RDW: 14.3 % (ref 11.5–15.5)
WBC: 4.7 10*3/uL (ref 4.0–10.5)
nRBC: 0 % (ref 0.0–0.2)

## 2022-06-29 LAB — TROPONIN I (HIGH SENSITIVITY): Troponin I (High Sensitivity): 11 ng/L (ref <18)

## 2022-06-29 LAB — BRAIN NATRIURETIC PEPTIDE: B Natriuretic Peptide: 900.7 pg/mL — ABNORMAL HIGH (ref 0.0–100.0)

## 2022-06-29 MED ORDER — ACETAMINOPHEN 325 MG PO TABS: 650.0000 mg | ORAL_TABLET | Freq: Four times a day (QID) | ORAL | Status: DC | PRN

## 2022-06-29 MED ORDER — METOPROLOL TARTRATE 5 MG/5ML IV SOLN: 5.0000 mg | Freq: Four times a day (QID) | INTRAVENOUS | Status: DC | PRN

## 2022-06-29 MED ORDER — FUROSEMIDE 10 MG/ML IJ SOLN
60.0000 mg | Freq: Once | INTRAMUSCULAR | Status: AC
  Administered 2022-06-29: 60 mg via INTRAVENOUS
  Filled 2022-06-29: qty 8

## 2022-06-29 MED ORDER — INSULIN ASPART 100 UNIT/ML IJ SOLN
0.0000 [IU] | Freq: Three times a day (TID) | INTRAMUSCULAR | Status: DC
  Administered 2022-06-30 (×3): 3 [IU] via SUBCUTANEOUS
  Administered 2022-07-01: 5 [IU] via SUBCUTANEOUS
  Administered 2022-07-01 – 2022-07-02 (×2): 3 [IU] via SUBCUTANEOUS
  Filled 2022-06-29: qty 0.15

## 2022-06-29 MED ORDER — LEVOTHYROXINE SODIUM 50 MCG PO TABS
175.0000 ug | ORAL_TABLET | Freq: Every day | ORAL | Status: DC
  Administered 2022-06-30 – 2022-07-02 (×3): 175 ug via ORAL
  Filled 2022-06-29 (×3): qty 1

## 2022-06-29 MED ORDER — AMLODIPINE BESYLATE 10 MG PO TABS
5.0000 mg | ORAL_TABLET | Freq: Every day | ORAL | Status: DC
  Administered 2022-06-30 – 2022-07-01 (×3): 5 mg via ORAL
  Filled 2022-06-29 (×3): qty 1

## 2022-06-29 MED ORDER — TEMAZEPAM 15 MG PO CAPS
15.0000 mg | ORAL_CAPSULE | Freq: Every day | ORAL | Status: DC
  Administered 2022-06-30 (×2): 30 mg via ORAL
  Administered 2022-07-01: 15 mg via ORAL
  Filled 2022-06-29: qty 1
  Filled 2022-06-29 (×2): qty 2

## 2022-06-29 MED ORDER — ALBUTEROL SULFATE (2.5 MG/3ML) 0.083% IN NEBU: 2.5000 mg | INHALATION_SOLUTION | RESPIRATORY_TRACT | Status: DC | PRN

## 2022-06-29 MED ORDER — ONDANSETRON HCL 4 MG/2ML IJ SOLN: 4.0000 mg | Freq: Four times a day (QID) | INTRAMUSCULAR | Status: DC | PRN

## 2022-06-29 MED ORDER — LEVOTHYROXINE SODIUM 175 MCG/ML PO SOLN: 175.0000 ug | Freq: Every day | ORAL | Status: DC

## 2022-06-29 MED ORDER — PREDNISONE 5 MG PO TABS
5.0000 mg | ORAL_TABLET | Freq: Every day | ORAL | Status: DC
  Administered 2022-06-30 – 2022-07-02 (×3): 5 mg via ORAL
  Filled 2022-06-29 (×3): qty 1

## 2022-06-29 MED ORDER — ENOXAPARIN SODIUM 30 MG/0.3ML IJ SOSY
30.0000 mg | PREFILLED_SYRINGE | INTRAMUSCULAR | Status: DC
  Administered 2022-06-30: 30 mg via SUBCUTANEOUS
  Filled 2022-06-29: qty 0.3

## 2022-06-29 MED ORDER — PANTOPRAZOLE SODIUM 40 MG PO TBEC
40.0000 mg | DELAYED_RELEASE_TABLET | Freq: Every day | ORAL | Status: DC
  Administered 2022-06-30 – 2022-07-02 (×3): 40 mg via ORAL
  Filled 2022-06-29 (×3): qty 1

## 2022-06-29 MED ORDER — TACROLIMUS 1 MG PO CAPS
2.0000 mg | ORAL_CAPSULE | Freq: Two times a day (BID) | ORAL | Status: DC
  Administered 2022-06-30 – 2022-07-02 (×5): 2 mg via ORAL
  Filled 2022-06-29 (×6): qty 2

## 2022-06-29 MED ORDER — POLYETHYLENE GLYCOL 3350 17 G PO PACK: 17.0000 g | PACK | Freq: Every day | ORAL | Status: DC | PRN

## 2022-06-29 MED ORDER — DOCUSATE SODIUM 100 MG PO CAPS
100.0000 mg | ORAL_CAPSULE | Freq: Two times a day (BID) | ORAL | Status: DC
  Administered 2022-06-30 – 2022-07-02 (×5): 100 mg via ORAL
  Filled 2022-06-29 (×6): qty 1

## 2022-06-29 MED ORDER — FERROUS SULFATE 325 (65 FE) MG PO TABS
325.0000 mg | ORAL_TABLET | Freq: Two times a day (BID) | ORAL | Status: DC
  Administered 2022-06-30 – 2022-07-02 (×6): 325 mg via ORAL
  Filled 2022-06-29 (×6): qty 1

## 2022-06-29 MED ORDER — INSULIN ASPART 100 UNIT/ML IJ SOLN
0.0000 [IU] | Freq: Every day | INTRAMUSCULAR | Status: DC
  Administered 2022-06-30: 3 [IU] via SUBCUTANEOUS
  Administered 2022-06-30: 4 [IU] via SUBCUTANEOUS
  Filled 2022-06-29: qty 0.05

## 2022-06-29 MED ORDER — GLIPIZIDE ER 5 MG PO TB24
5.0000 mg | ORAL_TABLET | Freq: Two times a day (BID) | ORAL | Status: DC
  Administered 2022-06-30: 5 mg via ORAL
  Filled 2022-06-29 (×2): qty 1

## 2022-06-29 MED ORDER — CARVEDILOL 6.25 MG PO TABS
6.2500 mg | ORAL_TABLET | Freq: Two times a day (BID) | ORAL | Status: DC
  Administered 2022-06-30 – 2022-07-02 (×5): 6.25 mg via ORAL
  Filled 2022-06-29 (×5): qty 1

## 2022-06-29 MED ORDER — ACETAMINOPHEN 650 MG RE SUPP: 650.0000 mg | Freq: Four times a day (QID) | RECTAL | Status: DC | PRN

## 2022-06-29 MED ORDER — ONDANSETRON HCL 4 MG PO TABS: 4.0000 mg | ORAL_TABLET | Freq: Four times a day (QID) | ORAL | Status: DC | PRN

## 2022-06-29 NOTE — ED Provider Notes (Addendum)
[place] AT [facility] Provider Note   CSN: [id number] Arrival date & time: 06/29/22  1836     History  Chief Complaint  Patient presents with   Shortness of Breath    Anita Pollard is a 82 y.o. 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  She additionally endorses orthopnea and PND.  She has had a dry cough as well.  She has had worsening associated BLE pitting edema.  No chest pain, abdominal pain, nausea vomiting or diarrhea.  She has been in touch with her primary doctor as well as her cardiologist who recommended that she increase her dose of Lasix, and then double her dose of Lasix earlier today.  She took 60 mg twice today before coming to the ED without significant diuresis.  She says she has gained about 12 pounds in the past couple weeks and then 3 pounds between yesterday and today.  Home Medications Prior to Admission medications   Medication Sig Start Date End Date Taking? Authorizing Provider  amLODipine (NORVASC) 5 MG tablet Take 5 mg by mouth at bedtime.    [provider]  carvedilol (COREG) 6.25 MG tablet Take 6.25 mg by mouth 2 (two) times daily with a meal.    [provider]  Cholecalciferol (VITAMIN D3) 5000 UNITS TABS Take 5,000 Units by mouth 3 (three) times daily after meals.     [provider]  ferrous sulfate 324 MG TBEC Take 324 mg by mouth 2 (two) times daily. Takes    [provider]  furosemide (LASIX) 40 MG tablet Take 1 tablet (40 mg total) by mouth daily. 02/09/21   Jerline Pain, MD  glipiZIDE (GLUCOTROL XL) 2.5 MG 24 hr tablet Take 5 mg by mouth 2 (two) times daily.  01/01/14   [provider]  insulin glargine, 1 Unit Dial, (TOUJEO SOLOSTAR) 300 UNIT/ML Solostar Pen 5 Units. 09/02/20   [provider]   Lancets (FREESTYLE) lancets by Does not apply route.    [provider]  Levothyroxine Sodium 175 MCG/ML SOLN Take 175 mcg by mouth daily before breakfast.    [provider]  losartan (COZAAR) 50 MG tablet Take 50 mg by mouth daily.    [provider]  Melatonin 5 MG CAPS Take 5 mg by mouth See admin instructions. Take 5 mg 2 hours before bedtime    [provider]  Multiple Vitamins-Minerals (ICAPS AREDS 2 PO) Take 1 tablet by mouth 2 (two) times daily.    [provider]  omeprazole (PRILOSEC) 20 MG capsule Take 20 mg by mouth daily.    [provider]  Polyethyl Glycol-Propyl Glycol (SYSTANE OP) Place 1 drop into both eyes every evening.    [provider]  pramipexole (MIRAPEX) 0.25 MG tablet Take 0.25-0.5 mg by mouth See admin instructions. Take 0.5 mg 2 hours prior to bedtime then 0.25 mg at bedtime    [provider]  predniSONE (DELTASONE) 10 MG tablet Take 5 mg by mouth daily with breakfast.    [provider]  raloxifene (EVISTA) 60 MG tablet Take 60 mg by mouth daily.    [provider]  rosuvastatin (CRESTOR) 5 MG tablet Take 1 tablet (5 mg total) by mouth 2 (two) times a week. Take on Mondays and Thursdays. 02/10/21   Jerline Pain, MD  tacrolimus (PROGRAF) 0.5 MG capsule Take 2 mg by mouth every 12 (twelve) hours.    [provider]  temazepam (RESTORIL) 15 MG capsule Take 15-30 mg by mouth at bedtime.  05/20/13   [provider]  vitamin B-12 (CYANOCOBALAMIN) 500 MCG tablet Take 500 mcg by mouth daily.    [provider]      Allergies    Penicillins, Banana, Detrol [tolterodine], Quetiapine, Risperidone and related, and Lisinopril    Review of Systems   See HPI  Physical Exam Updated Vital Signs BP (!) 129/59   Pulse (!) 56   Temp (!) 97.5 F (36.4 C)   Resp 17   SpO2 97%  Physical Exam Constitutional:      General: She is not in acute  distress. Cardiovascular:     Rate and Rhythm: Normal rate and regular rhythm.     Heart sounds: No murmur heard.    No friction rub. No gallop.     Comments: 2+ BLE pitting edema with JVD up to the mandible Pulmonary:     Effort: Pulmonary effort is normal. No tachypnea or respiratory distress.     Comments: Mild diffuse crackles Abdominal:     Palpations: Abdomen is soft.     Tenderness: There is no abdominal tenderness.  Skin:    General: Skin is warm.  Neurological:     Mental Status: She is alert.     ED Results / Procedures / Treatments   Labs (all labs ordered are listed, but only abnormal results are displayed) Labs Reviewed  CBC WITH DIFFERENTIAL/PLATELET - Abnormal; Notable for the following components:      Result Value   RBC 2.54 (*)    Hemoglobin 8.5 (*)    HCT 27.3 (*)    MCV 107.5 (*)    Platelets 145 (*)    All other components within normal limits  BRAIN NATRIURETIC PEPTIDE - Abnormal; Notable for the following components:   B Natriuretic Peptide 900.7 (*)    All other components within normal limits  COMPREHENSIVE METABOLIC PANEL - Abnormal; Notable for the following components:   Glucose, Bld 284 (*)    BUN 46 (*)    Creatinine, Ser 2.16 (*)    Calcium 8.2 (*)    Total Protein 6.4 (*)    GFR, Estimated 22 (*)    All other components within normal limits  TROPONIN I (HIGH SENSITIVITY)    EKG None  Radiology DG CHEST PORT 1 VIEW  Result Date: 06/29/2022 CLINICAL DATA:  Several day history of shortness of breath EXAM: PORTABLE CHEST 1 VIEW COMPARISON:  Chest radiograph dated 03/06/2022 FINDINGS: Normal lung volumes. No focal consolidations. Blunting of the right costophrenic angle. The heart size and mediastinal contours are within normal limits. Old healed right lateral rib fractures. IMPRESSION: Trace right pleural effusion.  No focal consolidations. Electronically Signed   By: Darrin Nipper M.D.   On: 06/29/2022 20:20    Procedures  Medications  Ordered in ED Medications  furosemide (LASIX) injection 60 mg (60 mg Intravenous Given 06/29/22 2036)    ED Course/ Medical Decision Making/ A&P Clinical Course as of 06/29/22 2126  Thu Jun 29, 2022  1933 81 YOF with extensive medical history here with chief complaint of SOB and swelling Diffuse full body swelling.  Has gone up to 60MG  BID of Lasix but still worsening Workup for CHF. [CC]  2020 Evaluated at bedside personally.  Bilateral lower extremity swelling, worsening despite Lasix increases.  Anticipate  heart failure exacerbation requiring admission. [CC]    Clinical Course User Index [CC] Tretha Sciara, MD   {    Medical Decision Making Amount and/or Complexity of Data Reviewed Labs: ordered. Radiology: ordered.  Risk Prescription drug management. Decision regarding hospitalization.   Anita Pollard is a 82 y.o. 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  She is HD stable on exam and oxygenating well on room air.  She has 2+ BLE pitting edema with JVD up to the angle of of her mandible.  Initial lab work is remarkable for anemia, 8.5 creatinine 2.16, BNP 900.7.  Troponins are unremarkable, EKG looks similar to prior.  Chest x-ray shows small right-sided pleural effusion.  Presentation overall consistent with CHF exacerbation with AKI.  Initiated Lasix diuresis in the ED, and believe she should be admitted for further management.  Final Clinical Impression(s) / ED Diagnoses Final diagnoses:  Acute on chronic congestive heart failure, unspecified heart failure type    Rx / DC Orders ED Discharge Orders     None         Linus Galas, MD 06/29/22 2126    Linus Galas, MD 06/29/22 2127    Tretha Sciara, MD 06/29/22  2228

## 2022-06-29 NOTE — Telephone Encounter (Signed)
Left message for patient to callback regarding fax received from Dr. Harrington Challenger' office (PCP).    Per fax: patient is not doing well--gained 12 pounds, puffy, swelling, rales in the right lung base--trying to diurese with Torsemide at home today instead of furosemide. BNP 902 at nephrologist 3/27. They did not change meds. Told to go to [facility] ER if not better. She says she has echo 4/15.

## 2022-06-29 NOTE — H&P (Signed)
History and Physical  Anita Pollard [id number] DOB: Mar 23, 1941 DOA: 06/29/2022  PCP: Lawerance Cruel, MD   Chief Complaint: leg swelling   HPI: Anita Pollard is a 82 y.o. female with medical history significant for kidney transplant, hypertension, hyperlipidemia, insulin-dependent type 2 diabetes, aortic sclerosis, chronic diastolic congestive heart failure being admitted to the hospital with dyspnea and bilateral leg swelling due to acute on chronic diastolic congestive heart failure.  History is provided by the patient, who says that for the last couple weeks, she has been having worsening bilateral lower extremity edema.  She has chronic left lower extremity edema, was told she has lymphedema.  Patient also has right lower extremity swelling for the last week or so.  She has also been feeling shortness of breath, but denies orthopnea.  She normally takes Lasix 40 mg p.o. twice daily, increased it to 60 mg p.o. twice daily for the last couple days, but with no response.  She therefore agreed to come to the ER for further evaluation and management.   ED Course: Evaluation in the ER reveals essentially normal vital signs, saturating 97% on room air.  Lab work revealed stable anemia, platelets 145, creatinine up to 2.16 BNP 900.  Chest x-ray unremarkable, she was given 60 mg IV Lasix x 1, and hospitalist contacted for admission.  Review of Systems: Please see HPI for pertinent positives and negatives. A complete 10 system review of systems are otherwise negative.  Past Medical History:  Diagnosis Date   Alcohol abuse    Anemia    Arthritis    Breast cancer 2010   Right   Chronic kidney disease    Colon polyp    Depression    Diabetes mellitus    GERD (gastroesophageal reflux disease)    Heart murmur    History of kidney stones 1976   HTN (hypertension)    Hypothyroidism    Osteopenia    Pancreatitis    Ulcerative esophagitis    Past Surgical History:  Procedure Laterality  Date   ABDOMINAL HYSTERECTOMY     ABDOMINOPLASTY     APPENDECTOMY     BREAST BIOPSY Right 08/03/2008   Stereo Bx, Malignant   BREAST LUMPECTOMY Right 08/26/2008   CATARACT EXTRACTION W/ INTRAOCULAR LENS  IMPLANT, BILATERAL     CO2 LASER APPLICATION N/A [id number]   Procedure: CO2 LASER APPLICATION;  Surgeon: Melida Quitter, MD;  Location: [facility] OR;  Service: ENT;  Laterality: N/A;   COSMETIC SURGERY     ductal carcinoma     right breast    EYE SURGERY     HIP ARTHROPLASTY Right    INSERTION OF DIALYSIS CATHETER Right 09/25/2012   Procedure: INSERTION OF DIALYSIS CATHETER;  Surgeon: Mal Misty, MD;  Location: [place];  Service: Vascular;  Laterality: Right;  Righ Internal Jugular Placement   IR GASTROSTOMY TUBE MOD SED  02/10/2020   IR GASTROSTOMY TUBE REMOVAL  05/25/2020   JOINT REPLACEMENT     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   MICROLARYNGOSCOPY N/A 10/20/2019   Procedure: Suspended Microlaryngoscopy with Biopsy;  Surgeon: Melida Quitter, MD;  Location: [place];  Service: ENT;  Laterality: N/A;   MICROLARYNGOSCOPY N/A 01/09/2020   Procedure: MICROLARYNGOSCOPY WITH BIOPSY;  Surgeon: Melida Quitter, MD;  Location: [place];  Service: ENT;  Laterality: N/A;   RIGID ESOPHAGOSCOPY N/A 10/20/2019   Procedure: RIGID ESOPHAGOSCOPY;  Surgeon: Melida Quitter, MD;  Location: [place];  Service: ENT;  Laterality: N/A;   THYROIDECTOMY  TUMMY TUCK      Social History:  reports that she quit smoking about 38 years ago. Her smoking use included cigarettes. She has never used smokeless tobacco. She reports current alcohol use. She reports that she does not use drugs.   Allergies  Allergen Reactions   Penicillins Itching and Swelling    Swelling and flushing Has patient had a PCN reaction causing immediate rash, facial/tongue/throat swelling, SOB or lightheadedness with hypotension: yes Has patient had a PCN reaction causing severe rash involving mucus membranes or skin necrosis: no Has patient had a PCN  reaction that required hospitalization: no Has patient had a PCN reaction occurring within the last 10 years: no If all of the above answers are "NO", then may proceed with Cephalosporin use.   Banana Nausea Only    Green bananas    Detrol [Tolterodine]     Leg cramps   Quetiapine Other (See Comments)    Leg cramps/restless leg   Risperidone And Related     Unknown reaction   Lisinopril Cough    Family History  Problem Relation Age of Onset   Hypertension Mother    Diabetes Father    Hypertension Father    Hypertension Sister    Dementia Sister    Multiple myeloma Sister    Thyroid cancer Daughter 65   Diabetes Brother    Colon polyps Brother    Atrial fibrillation Brother    Breast cancer Neg Hx      Prior to Admission medications   Medication Sig Start Date End Date Taking? Authorizing Provider  amLODipine (NORVASC) 5 MG tablet Take 5 mg by mouth at bedtime.    [provider]  carvedilol (COREG) 6.25 MG tablet Take 6.25 mg by mouth 2 (two) times daily with a meal.    [provider]  Cholecalciferol (VITAMIN D3) 5000 UNITS TABS Take 5,000 Units by mouth 3 (three) times daily after meals.     [provider]  ferrous sulfate 324 MG TBEC Take 324 mg by mouth 2 (two) times daily. Takes    [provider]  furosemide (LASIX) 40 MG tablet Take 1 tablet (40 mg total) by mouth daily. 02/09/21   Jerline Pain, MD  glipiZIDE (GLUCOTROL XL) 2.5 MG 24 hr tablet Take 5 mg by mouth 2 (two) times daily.  01/01/14   [provider]  insulin glargine, 1 Unit Dial, (TOUJEO SOLOSTAR) 300 UNIT/ML Solostar Pen 5 Units. 09/02/20   [provider]  Lancets (FREESTYLE) lancets by Does not apply route.    [provider]  Levothyroxine Sodium 175 MCG/ML SOLN Take 175 mcg by mouth daily before breakfast.    [provider]  losartan (COZAAR) 50 MG tablet Take 50 mg by mouth daily.    [provider]  Melatonin 5 MG CAPS  Take 5 mg by mouth See admin instructions. Take 5 mg 2 hours before bedtime    [provider]  Multiple Vitamins-Minerals (ICAPS AREDS 2 PO) Take 1 tablet by mouth 2 (two) times daily.    [provider]  omeprazole (PRILOSEC) 20 MG capsule Take 20 mg by mouth daily.    [provider]  Polyethyl Glycol-Propyl Glycol (SYSTANE OP) Place 1 drop into both eyes every evening.    [provider]  pramipexole (MIRAPEX) 0.25 MG tablet Take 0.25-0.5 mg by mouth See admin instructions. Take 0.5 mg 2 hours prior to bedtime then 0.25 mg at bedtime    [provider]  predniSONE (DELTASONE) 10 MG tablet Take 5 mg by mouth daily with breakfast.    [provider]  raloxifene (EVISTA) 60 MG tablet Take 60 mg by mouth daily.    [provider]  rosuvastatin (CRESTOR) 5 MG tablet Take 1 tablet (5 mg total) by mouth 2 (two) times a week. Take on Mondays and Thursdays. 02/10/21   Jerline Pain, MD  tacrolimus (PROGRAF) 0.5 MG capsule Take 2 mg by mouth every 12 (twelve) hours.    [provider]  temazepam (RESTORIL) 15 MG capsule Take 15-30 mg by mouth at bedtime.  05/20/13   [provider]  vitamin B-12 (CYANOCOBALAMIN) 500 MCG tablet Take 500 mcg by mouth daily.    [provider]    Physical Exam: BP (!) 129/59   Pulse (!) 56   Temp (!) 97.5 F (36.4 C)   Resp 17   SpO2 97%   General:  Alert, oriented, calm, in no acute distress, elderly female who is a good historian Eyes: EOMI, clear conjuctivae, white sclerea Neck: supple, no masses, trachea mildline  Cardiovascular: RRR, no murmurs or rubs, no peripheral edema  Respiratory: clear to auscultation bilaterally, but reduced at left base Abdomen: soft, nontender, nondistended, normal bowel tones heard  Skin: dry, no rashes  Musculoskeletal: no joint effusions, normal range of motion  Psychiatric: appropriate affect, normal speech  Neurologic: extraocular  muscles intact, clear speech, moving all extremities with intact sensorium          Labs on Admission:  Basic Metabolic Panel: Recent Labs  Lab 06/29/22 1953  NA 137  K 5.1  CL 105  CO2 23  GLUCOSE 284*  BUN 46*  CREATININE 2.16*  CALCIUM 8.2*   Liver Function Tests: Recent Labs  Lab 06/29/22 1953  AST 26  ALT 25  ALKPHOS 72  BILITOT 0.7  PROT 6.4*  ALBUMIN 3.7   No results for input(s): "LIPASE", "AMYLASE" in the last 168 hours. No results for input(s): "AMMONIA" in the last 168 hours. CBC: Recent Labs  Lab 06/29/22 1953  WBC 4.7  NEUTROABS 3.6  HGB 8.5*  HCT 27.3*  MCV 107.5*  PLT 145*   Cardiac Enzymes: No results for input(s): "CKTOTAL", "CKMB", "CKMBINDEX", "TROPONINI" in the last 168 hours.  BNP (last 3 results) Recent Labs    06/29/22 1954  BNP 900.7*    ProBNP (last 3 results) No results for input(s): "PROBNP" in the last 8760 hours.  CBG: No results for input(s): "GLUCAP" in the last 168 hours.  Radiological Exams on Admission: DG CHEST PORT 1 VIEW  Result Date: 06/29/2022 CLINICAL DATA:  Several day history of shortness of breath EXAM: PORTABLE CHEST 1 VIEW COMPARISON:  Chest radiograph dated 03/06/2022 FINDINGS: Normal lung volumes. No focal consolidations. Blunting of the right costophrenic angle. The heart size and mediastinal contours are within normal limits. Old healed right lateral rib fractures. IMPRESSION: Trace right pleural effusion.  No focal consolidations. Electronically Signed   By: Darrin Nipper M.D.   On: 06/29/2022 20:20    Assessment/Plan Principal Problem:   Acute on chronic diastolic CHF (congestive heart failure) -with lower extremity edema, elevated BNP.  She also has a history of aortic sclerosis which could cause heart failure symptoms.  Last echo in our system is 82 years old. -observation admission -Given IV Lasix bolus in the ER, recommended to reevaluate fluid status and renal function in the morning -Check 2D  echo -Continue Coreg patient -Consider inpatient cardiology  consult in the morning Active Problems:   HYPERTENSION, BENIGN-continue home amlodipine and Coreg   Carotid artery disease (HCC)   Normocytic normochromic anemia-continue to ferrous sulfate   DM2 (diabetes mellitus, type 2)-diabetic diet when eating, with sliding scale insulin, will continue her basal 5 units Lantus every morning   AKI (acute kidney injury) (HCC)-possibly due to fluid overload status, will diurese and monitor renal function daily.  Low threshold for renal ultrasound, renal consult given history of renal transplant. GERD-continue oral Protonix History of renal transplant-continue prednisone 5 mg p.o. daily, and Prograf 2 mg p.o. twice daily   Lower extremity edema-considering new and asymmetrical lower extremity edema, will check right lower extremity for DVT   Pure hypercholesterolemia-she takes statin twice weekly  DVT prophylaxis: Lovenox     Code Status: Full Code, confirmed with patient at the time of admission.  Consults called: None  Admission status: Observation   Time spent: 43 minutes  Orie Baxendale Neva Seat MD [facility] Pager [phone]  If 7PM-7AM, please contact night-coverage [email or web address] Password [facility]  06/29/2022, 10:36 PM

## 2022-06-29 NOTE — ED Triage Notes (Signed)
Pt here with reports of shob and lower extremity edema for the last few days. Took extra dose of fluid pill today without improvement.

## 2022-06-29 NOTE — Telephone Encounter (Signed)
Patient called the answering service this evening to let us know that her friend is taking her to the [facility] Emergency Department right now. She had called our office earlier today, and was instructed to go to the ED. Initially patient did not want to go to the ED, but is not agreeable. Patient's breathing is stable. Her friend will be driving her to the ED.   Margie Billet, PA-C 06/29/2022 6:12 PM

## 2022-06-29 NOTE — Telephone Encounter (Signed)
Patient returning call, transferred from operator.  Patient states she is walking with a walker due to fear of falling. She states she does not get short of breath with ambulation. She states she "feels poorly." She has swelling in her legs and puffiness in her face. She states she has gained 12 lbs in 1-1/2 weeks.   Patient had a double kidney transplant 9 years ago.  Patient states Dr. Harrington Challenger prescribed torsemide 60mg  BID today. Patient took first 60mg  when she got home from her appt today around 1:00pm. She states she has urinated twice since then, and is going to take the second 60mg  around 4:30pm today.  Reviewed message from Dr. Harrington Challenger with patient and encouraged her to go to ED. Patient states she will if second dose of torsemide does not start diuresing her. Advised patient she may need more intervention to get rid of excess fluid that can be provided at the hospital. Patient verbalized understanding.  Will put copy of fax from Dr. Harrington Challenger in Dr. Marlou Porch' mailbox and will forward call to him as well.

## 2022-06-29 NOTE — ED Notes (Signed)
Urine sent to main lab.

## 2022-06-30 ENCOUNTER — Observation Stay (HOSPITAL_COMMUNITY): Payer: Medicare PPO

## 2022-06-30 DIAGNOSIS — R609 Edema, unspecified: Secondary | ICD-10-CM

## 2022-06-30 DIAGNOSIS — Z7984 Long term (current) use of oral hypoglycemic drugs: Secondary | ICD-10-CM | POA: Diagnosis not present

## 2022-06-30 DIAGNOSIS — Y830 Surgical operation with transplant of whole organ as the cause of abnormal reaction of the patient, or of later complication, without mention of misadventure at the time of the procedure: Secondary | ICD-10-CM | POA: Diagnosis present

## 2022-06-30 DIAGNOSIS — N1832 Chronic kidney disease, stage 3b: Secondary | ICD-10-CM | POA: Diagnosis present

## 2022-06-30 DIAGNOSIS — I2510 Atherosclerotic heart disease of native coronary artery without angina pectoris: Secondary | ICD-10-CM | POA: Diagnosis present

## 2022-06-30 DIAGNOSIS — E7800 Pure hypercholesterolemia, unspecified: Secondary | ICD-10-CM

## 2022-06-30 DIAGNOSIS — N184 Chronic kidney disease, stage 4 (severe): Secondary | ICD-10-CM

## 2022-06-30 DIAGNOSIS — I5033 Acute on chronic diastolic (congestive) heart failure: Secondary | ICD-10-CM | POA: Diagnosis present

## 2022-06-30 DIAGNOSIS — I5030 Unspecified diastolic (congestive) heart failure: Secondary | ICD-10-CM | POA: Diagnosis not present

## 2022-06-30 DIAGNOSIS — K219 Gastro-esophageal reflux disease without esophagitis: Secondary | ICD-10-CM | POA: Diagnosis present

## 2022-06-30 DIAGNOSIS — T8619 Other complication of kidney transplant: Secondary | ICD-10-CM | POA: Diagnosis present

## 2022-06-30 DIAGNOSIS — I5021 Acute systolic (congestive) heart failure: Secondary | ICD-10-CM | POA: Diagnosis not present

## 2022-06-30 DIAGNOSIS — E1122 Type 2 diabetes mellitus with diabetic chronic kidney disease: Secondary | ICD-10-CM | POA: Diagnosis present

## 2022-06-30 DIAGNOSIS — Z79890 Hormone replacement therapy (postmenopausal): Secondary | ICD-10-CM | POA: Diagnosis not present

## 2022-06-30 DIAGNOSIS — Z7952 Long term (current) use of systemic steroids: Secondary | ICD-10-CM | POA: Diagnosis not present

## 2022-06-30 DIAGNOSIS — Z8249 Family history of ischemic heart disease and other diseases of the circulatory system: Secondary | ICD-10-CM | POA: Diagnosis not present

## 2022-06-30 DIAGNOSIS — Z79899 Other long term (current) drug therapy: Secondary | ICD-10-CM | POA: Diagnosis not present

## 2022-06-30 DIAGNOSIS — N179 Acute kidney failure, unspecified: Secondary | ICD-10-CM | POA: Diagnosis present

## 2022-06-30 DIAGNOSIS — N390 Urinary tract infection, site not specified: Secondary | ICD-10-CM | POA: Diagnosis present

## 2022-06-30 DIAGNOSIS — I509 Heart failure, unspecified: Secondary | ICD-10-CM

## 2022-06-30 DIAGNOSIS — I130 Hypertensive heart and chronic kidney disease with heart failure and stage 1 through stage 4 chronic kidney disease, or unspecified chronic kidney disease: Secondary | ICD-10-CM | POA: Diagnosis present

## 2022-06-30 DIAGNOSIS — Z880 Allergy status to penicillin: Secondary | ICD-10-CM | POA: Diagnosis not present

## 2022-06-30 DIAGNOSIS — Z923 Personal history of irradiation: Secondary | ICD-10-CM | POA: Diagnosis not present

## 2022-06-30 DIAGNOSIS — B9620 Unspecified Escherichia coli [E. coli] as the cause of diseases classified elsewhere: Secondary | ICD-10-CM | POA: Diagnosis present

## 2022-06-30 DIAGNOSIS — E876 Hypokalemia: Secondary | ICD-10-CM | POA: Diagnosis not present

## 2022-06-30 DIAGNOSIS — Z79621 Long term (current) use of calcineurin inhibitor: Secondary | ICD-10-CM | POA: Diagnosis not present

## 2022-06-30 DIAGNOSIS — D649 Anemia, unspecified: Secondary | ICD-10-CM | POA: Diagnosis present

## 2022-06-30 DIAGNOSIS — I700 Atherosclerosis of aorta: Secondary | ICD-10-CM | POA: Diagnosis present

## 2022-06-30 DIAGNOSIS — Z794 Long term (current) use of insulin: Secondary | ICD-10-CM | POA: Diagnosis not present

## 2022-06-30 DIAGNOSIS — E890 Postprocedural hypothyroidism: Secondary | ICD-10-CM | POA: Diagnosis present

## 2022-06-30 LAB — GLUCOSE, CAPILLARY
Glucose-Capillary: 169 mg/dL — ABNORMAL HIGH (ref 70–99)
Glucose-Capillary: 301 mg/dL — ABNORMAL HIGH (ref 70–99)
Glucose-Capillary: 162 mg/dL — ABNORMAL HIGH (ref 70–99)
Glucose-Capillary: 180 mg/dL — ABNORMAL HIGH (ref 70–99)
Glucose-Capillary: 252 mg/dL — ABNORMAL HIGH (ref 70–99)

## 2022-06-30 LAB — BASIC METABOLIC PANEL
Anion gap: 6 (ref 5–15)
BUN: 47 mg/dL — ABNORMAL HIGH (ref 8–23)
CO2: 27 mmol/L (ref 22–32)
Calcium: 8.2 mg/dL — ABNORMAL LOW (ref 8.9–10.3)
Chloride: 105 mmol/L (ref 98–111)
Creatinine, Ser: 2.23 mg/dL — ABNORMAL HIGH (ref 0.44–1.00)
GFR, Estimated: 21 mL/min — ABNORMAL LOW (ref >60)
Glucose, Bld: 227 mg/dL — ABNORMAL HIGH (ref 70–99)
Potassium: 4.3 mmol/L (ref 3.5–5.1)
Sodium: 138 mmol/L (ref 135–145)

## 2022-06-30 LAB — CBC
HCT: 25.2 % — ABNORMAL LOW (ref 36.0–46.0)
Hemoglobin: 7.8 g/dL — ABNORMAL LOW (ref 12.0–15.0)
MCH: 32.9 pg (ref 26.0–34.0)
MCHC: 31 g/dL (ref 30.0–36.0)
MCV: 106.3 fL — ABNORMAL HIGH (ref 80.0–100.0)
Platelets: 133 10*3/uL — ABNORMAL LOW (ref 150–400)
RBC: 2.37 MIL/uL — ABNORMAL LOW (ref 3.87–5.11)
RDW: 14.1 % (ref 11.5–15.5)
WBC: 3.1 10*3/uL — ABNORMAL LOW (ref 4.0–10.5)
nRBC: 0 % (ref 0.0–0.2)

## 2022-06-30 LAB — URINALYSIS, W/ REFLEX TO CULTURE (INFECTION SUSPECTED)
Bilirubin Urine: NEGATIVE
Glucose, UA: NEGATIVE mg/dL
Hgb urine dipstick: NEGATIVE
Ketones, ur: NEGATIVE mg/dL
Nitrite: NEGATIVE
Protein, ur: NEGATIVE mg/dL
Specific Gravity, Urine: 1.005 (ref 1.005–1.030)
pH: 5 (ref 5.0–8.0)

## 2022-06-30 LAB — HEMOGLOBIN A1C
Hgb A1c MFr Bld: 8.2 % — ABNORMAL HIGH (ref 4.8–5.6)
Mean Plasma Glucose: 189 mg/dL

## 2022-06-30 MED ORDER — HEPARIN SODIUM (PORCINE) 5000 UNIT/ML IJ SOLN
5000.0000 [IU] | Freq: Two times a day (BID) | INTRAMUSCULAR | Status: DC
  Administered 2022-07-01 – 2022-07-02 (×3): 5000 [IU] via SUBCUTANEOUS
  Filled 2022-06-30 (×3): qty 1

## 2022-06-30 MED ORDER — FUROSEMIDE 10 MG/ML IJ SOLN
40.0000 mg | Freq: Two times a day (BID) | INTRAMUSCULAR | Status: DC
  Administered 2022-06-30 (×2): 40 mg via INTRAVENOUS
  Filled 2022-06-30 (×3): qty 4

## 2022-06-30 NOTE — Consult Note (Signed)
Cardiology Consultation   Patient ID: Anita Pollard MRN: [id number]; DOB: April 08, 1940  Admit date: 06/29/2022 Date of Consult: 06/30/2022  PCP:  [name], MD   [facility] Providers Cardiologist:  None        Patient Profile:   Anita Pollard is a 82 y.o. female with a hx of CAD, hypertension, GERD, diabetes, kidney transplant, hypothyroidism, depression,  who is being seen 06/30/2022 for the evaluation of lower extremity edema, heart failure at the request of Dr. Benjamine Mola.  History of Present Illness:   Ms. Stall presented to the [facility] ED on 4/4 with symptoms of exertional dyspnea and bilateral leg swelling. Patient reports that over the past couple of weeks, legs have become increasingly swollen. (Of note, does have chronic lower extremity swelling). Given symptoms and weight gain of around 12 lbs in 1.5 weeks, patient saw her PCP  Dr. Tenny Craw yesterday (with Deboraha Sprang). Patient takes daily lasix 40mg  but was given Torsemide 60mg  BID due to worsening edema. Patient was advised that she should report to the ED if symptoms did not rapidly improve with Torsemide. Ultimately patient made decision to present to [facility] ED yesterday for further evaluation.   In the ED, patient found with creatinine 2.16, BNP 900.7, HGB 8.5. Labwork otherwise unremarkable. Review of recent outside labs shows that patient's BNP was similar, 902 with nephrology on 06/21/22. Chest x-ray showed trace right pleural effusion.  Today patient states that she has been feeling SOB for a few days and noticed 3 pound weight gain in one night. Notes weights typically are in the 120s and up to 150s here. No significant PND. She was encouraged to come to the hospital with concern for heart failure exacerbation   Patient is followed by Dr. Anne Fu with last office visit on 05/31/22. At that time, decision made to pursue repeat outpatient TTE to further assess for possible progression of aortic sclerosis to stenosis (1/6  systolic murmur on physical exam).  Past Medical History:  Diagnosis Date   Alcohol abuse    Anemia    Arthritis    Breast cancer 2010   Right   Chronic kidney disease    Colon polyp    Depression    Diabetes mellitus    GERD (gastroesophageal reflux disease)    Heart murmur    History of kidney stones 1976   HTN (hypertension)    Hypothyroidism    Osteopenia    Pancreatitis    Ulcerative esophagitis     Past Surgical History:  Procedure Laterality Date   ABDOMINAL HYSTERECTOMY     ABDOMINOPLASTY     APPENDECTOMY     BREAST BIOPSY Right 08/03/2008   Stereo Bx, Malignant   BREAST LUMPECTOMY Right 08/26/2008   CATARACT EXTRACTION W/ INTRAOCULAR LENS  IMPLANT, BILATERAL     CO2 LASER APPLICATION N/A 01/09/2020   Procedure: CO2 LASER APPLICATION;  Surgeon: Christia Reading, MD;  Location: [facility] OR;  Service: ENT;  Laterality: N/A;   COSMETIC SURGERY     ductal carcinoma     right breast    EYE SURGERY     HIP ARTHROPLASTY Right    INSERTION OF DIALYSIS CATHETER Right 09/25/2012   Procedure: INSERTION OF DIALYSIS CATHETER;  Surgeon: Pryor Ochoa, MD;  Location: [facility] OR;  Service: Vascular;  Laterality: Right;  Righ Internal Jugular Placement   IR GASTROSTOMY TUBE MOD SED  02/10/2020   IR GASTROSTOMY TUBE REMOVAL  05/25/2020   JOINT REPLACEMENT  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   MICROLARYNGOSCOPY N/A 10/20/2019   Procedure: Suspended Microlaryngoscopy with Biopsy;  Surgeon: Christia Reading, MD;  Location: [facility] OR;  Service: ENT;  Laterality: N/A;   MICROLARYNGOSCOPY N/A 01/09/2020   Procedure: MICROLARYNGOSCOPY WITH BIOPSY;  Surgeon: Christia Reading, MD;  Location: [facility] OR;  Service: ENT;  Laterality: N/A;   RIGID ESOPHAGOSCOPY N/A 10/20/2019   Procedure: RIGID ESOPHAGOSCOPY;  Surgeon: Christia Reading, MD;  Location: [facility] OR;  Service: ENT;  Laterality: N/A;   THYROIDECTOMY     TUMMY TUCK       Home Medications:  Prior to Admission medications   Medication Sig Start Date End Date  Taking? Authorizing Provider  torsemide (DEMADEX) 20 MG tablet Take 20-60 mg by mouth in the morning, at noon, and at bedtime. 06/29/22  Yes [provider]  amLODipine (NORVASC) 5 MG tablet Take 5 mg by mouth at bedtime.    [provider]  carvedilol (COREG) 12.5 MG tablet Take 25 mg by mouth 2 (two) times daily. 05/04/22   [provider]  carvedilol (COREG) 6.25 MG tablet Take 6.25 mg by mouth 2 (two) times daily with a meal.    [provider]  Cholecalciferol (VITAMIN D3) 5000 UNITS TABS Take 5,000 Units by mouth 3 (three) times daily after meals.     [provider]  CREON 3000-9500 units CPEP Take 3 capsules by mouth in the morning, at noon, and at bedtime. 01/07/15   [provider]  ferrous sulfate 324 MG TBEC Take 324 mg by mouth 2 (two) times daily. Takes    [provider]  furosemide (LASIX) 40 MG tablet Take 1 tablet (40 mg total) by mouth daily. 02/09/21   [name], MD  glipiZIDE (GLUCOTROL XL) 2.5 MG 24 hr tablet Take 5 mg by mouth 2 (two) times daily.  01/01/14   [provider]  insulin glargine, 1 Unit Dial, (TOUJEO SOLOSTAR) 300 UNIT/ML Solostar Pen 5 Units. 09/02/20   [provider]  Lancets (FREESTYLE) lancets by Does not apply route.    [provider]  Levothyroxine Sodium 175 MCG/ML SOLN Take 175 mcg by mouth daily before breakfast.    [provider]  losartan (COZAAR) 50 MG tablet Take 50 mg by mouth daily.    [provider]  Melatonin 5 MG CAPS Take 5 mg by mouth See admin instructions. Take 5 mg 2 hours before bedtime    [provider]  Multiple Vitamins-Minerals (ICAPS AREDS 2 PO) Take 1 tablet by mouth 2 (two) times daily.    [provider]  omeprazole (PRILOSEC) 20 MG capsule Take 20 mg by mouth daily.    [provider]  Polyethyl Glycol-Propyl Glycol (SYSTANE OP) Place 1 drop into both eyes every evening.    [provider]  potassium chloride (KLOR-CON M) 10 MEQ tablet Take 20 mEq by mouth 2 (two) times daily. 05/04/22   [provider]  pramipexole (MIRAPEX) 0.25 MG tablet Take 0.25-0.5 mg by mouth See admin instructions. Take 0.5 mg 2 hours prior to bedtime then 0.25 mg at bedtime    [provider]  predniSONE (DELTASONE) 10 MG tablet Take 5 mg by mouth daily with breakfast.    [provider]  raloxifene (EVISTA) 60 MG tablet Take 60 mg by mouth daily.    [provider]  rosuvastatin (CRESTOR) 5 MG tablet Take 1 tablet (5 mg total) by mouth 2 (two) times a week. Take on Mondays  and Thursdays. 02/10/21   Jake BatheSkains, Mark C, MD  tacrolimus (PROGRAF) 0.5 MG capsule Take 2 mg by mouth every 12 (twelve) hours.    [provider]  temazepam (RESTORIL) 15 MG capsule Take 15-30 mg by mouth at bedtime.  05/20/13   [provider]  vitamin B-12 (CYANOCOBALAMIN) 500 MCG tablet Take 500 mcg by mouth daily.    [provider]    Inpatient Medications: Scheduled Meds:  amLODipine  5 mg Oral QHS   carvedilol  6.25 mg Oral BID WC   docusate sodium  100 mg Oral BID   enoxaparin (LOVENOX) injection  30 mg Subcutaneous Q24H   ferrous sulfate  325 mg Oral BID   furosemide  40 mg Intravenous Q12H   glipiZIDE  5 mg Oral BID WC   insulin aspart  0-15 Units Subcutaneous TID WC   insulin aspart  0-5 Units Subcutaneous QHS   levothyroxine  175 mcg Oral Q0600   pantoprazole  40 mg Oral Daily   predniSONE  5 mg Oral Q breakfast   tacrolimus  2 mg Oral Q12H   temazepam  15-30 mg Oral QHS   Continuous Infusions:  PRN Meds: acetaminophen **OR** acetaminophen, albuterol, metoprolol tartrate, ondansetron **OR** ondansetron (ZOFRAN) IV, polyethylene glycol  Allergies:    Allergies  Allergen Reactions   Penicillins Itching and Swelling    Swelling and flushing Has patient had a PCN reaction causing immediate rash, facial/tongue/throat swelling, SOB or lightheadedness  with hypotension: yes Has patient had a PCN reaction causing severe rash involving mucus membranes or skin necrosis: no Has patient had a PCN reaction that required hospitalization: no Has patient had a PCN reaction occurring within the last 10 years: no If all of the above answers are "NO", then may proceed with Cephalosporin use.   Banana Nausea Only    Green bananas    Detrol [Tolterodine]     Leg cramps   Quetiapine Other (See Comments)    Leg cramps/restless leg   Risperidone And Related     Unknown reaction   Lisinopril Cough    Social History:   Social History   Socioeconomic History   Marital status: Widowed    Spouse name: Not on file   Number of children: Not on file   Years of education: Not on file   Highest education level: Not on file  Occupational History   Not on file  Tobacco Use   Smoking status: Former    Types: Cigarettes    Quit date: 09/25/1983    Years since quitting: 38.7   Smokeless tobacco: Never  Vaping Use   Vaping Use: Never used  Substance and Sexual Activity   Alcohol use: Yes    Comment: socially history of heavy drinking with intervention x 2-at least 2 occasions since april '14   Drug use: No   Sexual activity: Never  Other Topics Concern   Not on file  Social History Narrative   Not on file   Social Determinants of Health   Financial Resource Strain: Low Risk  (01/22/2020)   Overall Financial Resource Strain (CARDIA)    Difficulty of Paying Living Expenses: Not hard at all  Food Insecurity: No Food Insecurity (06/30/2022)   Hunger Vital Sign    Worried About Running Out of Food in the Last Year: Never true    Ran Out of Food in the Last Year: Never true  Transportation Needs: No Transportation Needs (06/30/2022)   PRAPARE - Transportation  Lack of Transportation (Medical): No    Lack of Transportation (Non-Medical): No  Physical Activity: Insufficiently Active (01/22/2020)   Exercise Vital Sign    Days of Exercise per Week: 5  days    Minutes of Exercise per Session: 20 min  Stress: Not on file  Social Connections: Moderately Isolated (01/22/2020)   Social Connection and Isolation Panel [NHANES]    Frequency of Communication with Friends and Family: More than three times a week    Frequency of Social Gatherings with Friends and Family: Never    Attends Religious Services: Never    Database administrator or Organizations: Yes    Attends Banker Meetings: Never    Marital Status: Widowed  Intimate Partner Violence: Not At Risk (06/30/2022)   Humiliation, Afraid, Rape, and Kick questionnaire    Fear of Current or Ex-Partner: No    Emotionally Abused: No    Physically Abused: No    Sexually Abused: No    Family History:    Family History  Problem Relation Age of Onset   Hypertension Mother    Diabetes Father    Hypertension Father    Hypertension Sister    Dementia Sister    Multiple myeloma Sister    Thyroid cancer Daughter 40   Diabetes Brother    Colon polyps Brother    Atrial fibrillation Brother    Breast cancer Neg Hx      ROS:  Please see the history of present illness.   All other ROS reviewed and negative.     Physical Exam/Data:   Vitals:   06/29/22 2340 06/29/22 2346 06/30/22 0420 06/30/22 0818  BP: (!) 142/55  (!) 146/55 (!) 136/52  Pulse: 61  (!) 58 62  Resp: 20  20 (!) 22  Temp: 97.6 F (36.4 C)  (!) 97.4 F (36.3 C) 97.8 F (36.6 C)  TempSrc: Oral  Oral Oral  SpO2: 98%  95% 97%  Weight:  69.1 kg 69.1 kg   Height:  4\' 11"  (1.499 m)      Intake/Output Summary (Last 24 hours) at 06/30/2022 1149 Last data filed at 06/30/2022 1005 Gross per 24 hour  Intake 60 ml  Output 2550 ml  Net -2490 ml       06/30/2022    4:20 AM 06/29/2022   11:46 PM 06/20/2022   12:56 PM  Last 3 Weights  Weight (lbs) 152 lb 5.4 oz 152 lb 5.4 oz 135 lb 9.6 oz  Weight (kg) 69.1 kg 69.1 kg 61.508 kg     Body mass index is 30.77 kg/m.  General:  Well nourished, well developed, in no  acute distress HEENT: normal Neck: ++ JVD Vascular: No carotid bruits; Distal pulses 2+ bilaterally Cardiac:  normal S1, S2; RRR; no murmur  Lungs:  clear to auscultation bilaterally, no wheezing, rhonchi or rales  Abd: soft, nontender, no hepatomegaly  Ext: 1+ BL edema Musculoskeletal:  No deformities, BUE and BLE strength normal and equal Skin: warm and dry  Neuro:  CNs 2-12 intact, no focal abnormalities noted Psych:  Normal affect   EKG:  The EKG was personally reviewed and demonstrates:  sinus rhythm with baseline artifact. No acute ischemic changes. Sinus bradycardia HR 55 bpm  Telemetry:  Telemetry was personally reviewed and demonstrates:  sinus bradycardia HR in the 50s  Relevant CV Studies:  03/18/20 TTE IMPRESSIONS     1. Left ventricular ejection fraction, by estimation, is 70 to 75%. The  left ventricle has  hyperdynamic function. The left ventricle has no  regional wall motion abnormalities. Left ventricular diastolic parameters  are consistent with Grade I diastolic  dysfunction (impaired relaxation). The average left ventricular global  longitudinal strain is 28.3 %. The global longitudinal strain is normal.   2. Right ventricular systolic function is normal. The right ventricular  size is normal. There is normal pulmonary artery systolic pressure.   3. The mitral valve is grossly normal. Trivial mitral valve  regurgitation.   4. The aortic valve was not well visualized. Aortic valve regurgitation  is not visualized. Mild aortic valve sclerosis is present, with no  evidence of aortic valve stenosis.   5. The inferior vena cava is normal in size with greater than 50%  respiratory variability, suggesting right atrial pressure of 3 mmHg.   Comparison(s): No significant change from prior study. 05/08/2018: LVEF  >65%, grade 1 DD.   FINDINGS   Left Ventricle: Left ventricular ejection fraction, by estimation, is 70  to 75%. The left ventricle has hyperdynamic  function. The left ventricle  has no regional wall motion abnormalities. The average left ventricular  global longitudinal strain is 28.3  %. The global longitudinal strain is normal. The left ventricular internal  cavity size was normal in size. There is no left ventricular hypertrophy.  Left ventricular diastolic parameters are consistent with Grade I  diastolic dysfunction (impaired  relaxation). Indeterminate filling pressures.   Right Ventricle: The right ventricular size is normal. No increase in  right ventricular wall thickness. Right ventricular systolic function is  normal. There is normal pulmonary artery systolic pressure. The tricuspid  regurgitant velocity is 2.62 m/s, and   with an assumed right atrial pressure of 3 mmHg, the estimated right  ventricular systolic pressure is 30.5 mmHg.   Left Atrium: Left atrial size was normal in size.   Right Atrium: Right atrial size was normal in size.   Pericardium: There is no evidence of pericardial effusion.   Mitral Valve: The mitral valve is grossly normal. Trivial mitral valve  regurgitation.   Tricuspid Valve: The tricuspid valve is grossly normal. Tricuspid valve  regurgitation is trivial.   Aortic Valve: The aortic valve was not well visualized. Aortic valve  regurgitation is not visualized. Mild aortic valve sclerosis is present,  with no evidence of aortic valve stenosis.   Pulmonic Valve: The pulmonic valve was not well visualized. Pulmonic valve  regurgitation is not visualized.   Aorta: The aortic root and ascending aorta are structurally normal, with  no evidence of dilitation.   Venous: The inferior vena cava is normal in size with greater than 50%  respiratory variability, suggesting right atrial pressure of 3 mmHg.   IAS/Shunts: No atrial level shunt detected by color flow Doppler.    Laboratory Data:  High Sensitivity Troponin:   Recent Labs  Lab 06/29/22 1953  TROPONINIHS 11       Chemistry Recent Labs  Lab 06/29/22 1953 06/30/22 0413  NA 137 138  K 5.1 4.3  CL 105 105  CO2 23 27  GLUCOSE 284* 227*  BUN 46* 47*  CREATININE 2.16* 2.23*  CALCIUM 8.2* 8.2*  GFRNONAA 22* 21*  ANIONGAP 9 6     Recent Labs  Lab 06/29/22 1953  PROT 6.4*  ALBUMIN 3.7  AST 26  ALT 25  ALKPHOS 72  BILITOT 0.7    Lipids No results for input(s): "CHOL", "TRIG", "HDL", "LABVLDL", "LDLCALC", "CHOLHDL" in the last 168 hours.  Hematology Recent Labs  Lab  06/29/22 1953 06/30/22 0413  WBC 4.7 3.1*  RBC 2.54* 2.37*  HGB 8.5* 7.8*  HCT 27.3* 25.2*  MCV 107.5* 106.3*  MCH 33.5 32.9  MCHC 31.1 31.0  RDW 14.3 14.1  PLT 145* 133*    Thyroid No results for input(s): "TSH", "FREET4" in the last 168 hours.  BNP Recent Labs  Lab 06/29/22 1954  BNP 900.7*     DDimer No results for input(s): "DDIMER" in the last 168 hours.   Radiology/Studies:  VAS [place] LOWER EXTREMITY VENOUS (DVT)  Result Date: 06/30/2022  Lower Venous DVT Study Patient Name:  MANJIT STIDD  Date of Exam:   06/30/2022 Medical Rec #: [id number]        Accession #:    [id number] Date of Birth: Feb 20, 1941        Patient Gender: F Patient Age:   15 years Exam Location:  [facility] Procedure:      VAS [place] LOWER EXTREMITY VENOUS (DVT) Referring Phys: [facility] --------------------------------------------------------------------------------  Indications: Edema.  Risk Factors: None identified. Limitations: Poor ultrasound/tissue interface and patient pain tolerance. Comparison Study: No prior studies. Performing Technologist: Chanda Busing RVT  Examination Guidelines: A complete evaluation includes B-mode imaging, spectral Doppler, color Doppler, and power Doppler as needed of all accessible portions of each vessel. Bilateral testing is considered an integral part of a complete examination. Limited examinations for reoccurring indications may be performed as noted. The reflux portion of the exam is performed  with the patient in reverse Trendelenburg.  +---------+---------------+---------+-----------+----------+--------------+ RIGHT    CompressibilityPhasicitySpontaneityPropertiesThrombus Aging +---------+---------------+---------+-----------+----------+--------------+ CFV      Full           Yes      Yes                                 +---------+---------------+---------+-----------+----------+--------------+ SFJ      Full                                                        +---------+---------------+---------+-----------+----------+--------------+ FV Prox  Full                                                        +---------+---------------+---------+-----------+----------+--------------+ FV Mid   Full                                                        +---------+---------------+---------+-----------+----------+--------------+ FV Distal               Yes      Yes                                 +---------+---------------+---------+-----------+----------+--------------+ PFV      Full                                                        +---------+---------------+---------+-----------+----------+--------------+  POP      Full           Yes      Yes                                 +---------+---------------+---------+-----------+----------+--------------+ PTV      Full                                                        +---------+---------------+---------+-----------+----------+--------------+ PERO     Full                                                        +---------+---------------+---------+-----------+----------+--------------+   +----+---------------+---------+-----------+----------+--------------+ LEFTCompressibilityPhasicitySpontaneityPropertiesThrombus Aging +----+---------------+---------+-----------+----------+--------------+ CFV Full           Yes      Yes                                  +----+---------------+---------+-----------+----------+--------------+    Summary: RIGHT: - There is no evidence of deep vein thrombosis in the lower extremity. However, portions of this examination were limited- see technologist comments above.  - No cystic structure found in the popliteal fossa.  LEFT: - No evidence of common femoral vein obstruction.  *See table(s) above for measurements and observations.    Preliminary    DG CHEST PORT 1 VIEW  Result Date: 06/29/2022 CLINICAL DATA:  Several day history of shortness of breath EXAM: PORTABLE CHEST 1 VIEW COMPARISON:  Chest radiograph dated 03/06/2022 FINDINGS: Normal lung volumes. No focal consolidations. Blunting of the right costophrenic angle. The heart size and mediastinal contours are within normal limits. Old healed right lateral rib fractures. IMPRESSION: Trace right pleural effusion.  No focal consolidations. Electronically Signed   By: Agustin Cree M.D.   On: 06/29/2022 20:20     Assessment and Plan:   HFpEF: EF 70-75% in 2021. Normal RV. No sig MR. Her weight is up and she had JVD c/f CHF exacerbation. BNP elevated to 900.Has received IV lasix 60mg  and 40mg  with net output of 2.4L.  Echocardiogram today Continue IV diuresis No SGLT2 at this time with renal fxn  CKD  Stage IV - crt bsl ~1.6, has slowly increased over the last couple of months  Hypercholesterolemia Continue Crestor 5mg  twice weekly.   Time Spent Directly with Patient:  I have spent a total of 50 minutes with the patient reviewing hospital notes, telemetry, EKGs, labs and examining the patient as well as establishing an assessment and plan that was discussed personally with the patient.  > 50% of time was spent in direct patient care.    Risk Assessment/Risk Scores:      For questions or updates, please contact [facility] Please consult [email or web address] for contact info under    Signed, Maisie Fus, MD  06/30/2022 11:49 AM

## 2022-06-30 NOTE — Progress Notes (Signed)
Mobility Specialist - Progress Note   06/30/22 1406  Mobility  Activity Ambulated with assistance in hallway  Level of Assistance Standby assist, set-up cues, supervision of patient - no hands on  Assistive Device Front wheel walker  Distance Ambulated (ft) 250 ft  Activity Response Tolerated well  Mobility Referral Yes  $Mobility charge 1 Mobility   Pt received in bed and agreeable to mobility. No complaints during session. Pt to recliner after session with all needs met.    [facility]

## 2022-06-30 NOTE — Telephone Encounter (Signed)
Pt currently admitted for CHF.

## 2022-06-30 NOTE — Plan of Care (Signed)
  Problem: Education: Goal: Knowledge of General Education information will improve Description: Including pain rating scale, medication(s)/side effects and non-pharmacologic comfort measures Outcome: Completed/Met   Problem: Activity: Goal: Risk for activity intolerance will decrease Outcome: Progressing   Problem: Nutrition: Goal: Adequate nutrition will be maintained Outcome: Progressing   Problem: Coping: Goal: Level of anxiety will decrease Outcome: Progressing   

## 2022-06-30 NOTE — Progress Notes (Signed)
Right lower extremity venous duplex has been completed. Preliminary results can be found in CV Proc through chart review.   06/30/22 9:23 AM Olen Cordial RVT

## 2022-06-30 NOTE — Consult Note (Incomplete)
Cardiology Consultation   Patient ID: Anita Pollard MRN: [id number]; DOB: 27-Feb-1941  Admit date: 06/29/2022 Date of Consult: 06/30/2022  PCP:  [name], MD   [facility] Providers Cardiologist:  None   { Click here to update MD or APP on Care Team, Refresh:1}     Patient Profile:   Anita Pollard is a 82 y.o. 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 Benjamine Mola.  History of Present Illness:   Ms. Fauth presented to the [facility] ED on 4/4 with symptoms of exertional dyspnea and bilateral leg swelling. Patient reports that over the past couple of weeks, legs have become increasingly swollen. (Of note, does have chronic lower extremity swelling). Given symptoms and weight gain of around 12 lbs in 1.5 weeks, patient saw her PCP  Dr. Tenny Craw yesterday (with Deboraha Sprang). Patient takes daily lasix 40mg  but was given Torsemide 60mg  BID due to worsening edema. Patient was advised that she should report to the ED if symptoms did not rapidly improve with Torsemide. Ultimately patient made decision to present to [facility] ED yesterday for further evaluation.   In the ED, patient found with creatinine 2.16, BNP 900.7, HGB 8.5. Labwork otherwise unremarkable. Review of recent outside labs shows that patient's BNP was similar, 902 with nephrology on 06/21/22. Chest x-ray showed trace right pleural effusion.  Today patient states that she has been feeling SOB for a few days and noticed 3 pound weight gain in one night. Notes weights typically are in the 120s and up to 150s here. No significant PND. She was encouraged to come to the hospital with concern for heart failure exacerbation   Patient is followed by Dr. Anne Fu with last office visit on 05/31/22. At that time, decision made to  pursue repeat outpatient TTE to further assess for possible progression of aortic sclerosis to stenosis (1/6 systolic murmur on physical exam).  Past Medical History:  Diagnosis Date  . Alcohol abuse   . Anemia   . Arthritis   . Breast cancer 2010   Right  . Chronic kidney disease   . Colon polyp   . Depression   . Diabetes mellitus   . GERD (gastroesophageal reflux disease)   . Heart murmur   . History of kidney stones 1976  . HTN (hypertension)   . Hypothyroidism   . Osteopenia   . Pancreatitis   . Ulcerative esophagitis     Past Surgical History:  Procedure Laterality Date  . ABDOMINAL HYSTERECTOMY    . ABDOMINOPLASTY    . APPENDECTOMY    . BREAST BIOPSY Right 08/03/2008   Stereo Bx, Malignant  . BREAST LUMPECTOMY Right 08/26/2008  . CATARACT EXTRACTION W/ INTRAOCULAR LENS  IMPLANT, BILATERAL    . CO2 LASER APPLICATION N/A 01/09/2020   Procedure: CO2 LASER APPLICATION;  Surgeon: Christia Reading, MD;  Location: [facility] OR;  Service: ENT;  Laterality: N/A;  . COSMETIC SURGERY    . ductal carcinoma     right breast   . EYE SURGERY    . HIP ARTHROPLASTY Right   . INSERTION OF DIALYSIS CATHETER Right 09/25/2012   Procedure: INSERTION OF DIALYSIS CATHETER;  Surgeon: Pryor Ochoa, MD;  Location: [facility] OR;  Service: Vascular;  Laterality: Right;  Righ Internal Jugular Placement  . IR GASTROSTOMY TUBE MOD  SED  02/10/2020  . IR GASTROSTOMY TUBE REMOVAL  05/25/2020  . JOINT REPLACEMENT    . 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 MICROLARYNGOSCOPY N/A 10/20/2019   Procedure: Suspended Microlaryngoscopy with Biopsy;  Surgeon: Christia Reading, MD;  Location: [facility] OR;  Service: ENT;  Laterality: N/A;  . MICROLARYNGOSCOPY N/A 01/09/2020   Procedure: MICROLARYNGOSCOPY WITH BIOPSY;  Surgeon: Christia Reading, MD;  Location: [facility] OR;  Service: ENT;  Laterality: N/A;  . RIGID ESOPHAGOSCOPY N/A 10/20/2019   Procedure: RIGID ESOPHAGOSCOPY;  Surgeon: Christia Reading, MD;  Location: [facility] OR;  Service: ENT;   Laterality: N/A;  . THYROIDECTOMY    . TUMMY TUCK       Home Medications:  Prior to Admission medications   Medication Sig Start Date End Date Taking? Authorizing Provider  torsemide (DEMADEX) 20 MG tablet Take 20-60 mg by mouth in the morning, at noon, and at bedtime. 06/29/22  Yes [provider]  amLODipine (NORVASC) 5 MG tablet Take 5 mg by mouth at bedtime.    [provider]  carvedilol (COREG) 12.5 MG tablet Take 25 mg by mouth 2 (two) times daily. 05/04/22   [provider]  carvedilol (COREG) 6.25 MG tablet Take 6.25 mg by mouth 2 (two) times daily with a meal.    [provider]  Cholecalciferol (VITAMIN D3) 5000 UNITS TABS Take 5,000 Units by mouth 3 (three) times daily after meals.     [provider]  CREON 3000-9500 units CPEP Take 3 capsules by mouth in the morning, at noon, and at bedtime. 01/07/15   [provider]  ferrous sulfate 324 MG TBEC Take 324 mg by mouth 2 (two) times daily. Takes    [provider]  furosemide (LASIX) 40 MG tablet Take 1 tablet (40 mg total) by mouth daily. 02/09/21   [name], MD  glipiZIDE (GLUCOTROL XL) 2.5 MG 24 hr tablet Take 5 mg by mouth 2 (two) times daily.  01/01/14   [provider]  insulin glargine, 1 Unit Dial, (TOUJEO SOLOSTAR) 300 UNIT/ML Solostar Pen 5 Units. 09/02/20   [provider]  Lancets (FREESTYLE) lancets by Does not apply route.    [provider]  Levothyroxine Sodium 175 MCG/ML SOLN Take 175 mcg by mouth daily before breakfast.    [provider]  losartan (COZAAR) 50 MG tablet Take 50 mg by mouth daily.    [provider]  Melatonin 5 MG CAPS Take 5 mg by mouth See admin instructions. Take 5 mg 2 hours before bedtime    [provider]  Multiple Vitamins-Minerals (ICAPS AREDS 2 PO) Take 1 tablet by mouth 2 (two) times daily.    [provider]  omeprazole (PRILOSEC) 20 MG capsule Take 20 mg by  mouth daily.    [provider]  Polyethyl Glycol-Propyl Glycol (SYSTANE OP) Place 1 drop into both eyes every evening.    [provider]  potassium chloride (KLOR-CON M) 10 MEQ tablet Take 20 mEq by mouth 2 (two) times daily. 05/04/22   [provider]  pramipexole (MIRAPEX) 0.25 MG tablet Take 0.25-0.5 mg by mouth See admin instructions. Take 0.5 mg 2 hours prior to bedtime then 0.25 mg at bedtime    [provider]  predniSONE (DELTASONE) 10 MG tablet Take 5 mg by mouth daily with breakfast.    [provider]  raloxifene (EVISTA) 60 MG tablet Take 60 mg by mouth daily.    [provider]  rosuvastatin (CRESTOR)  5 MG tablet Take 1 tablet (5 mg total) by mouth 2 (two) times a week. Take on Mondays and Thursdays. 02/10/21   [name], MD  tacrolimus (PROGRAF) 0.5 MG capsule Take 2 mg by mouth every 12 (twelve) hours.    [provider]  temazepam (RESTORIL) 15 MG capsule Take 15-30 mg by mouth at bedtime.  05/20/13   [provider]  vitamin B-12 (CYANOCOBALAMIN) 500 MCG tablet Take 500 mcg by mouth daily.    [provider]    Inpatient Medications: Scheduled Meds: . amLODipine  5 mg Oral QHS  . carvedilol  6.25 mg Oral BID WC  . docusate sodium  100 mg Oral BID  . enoxaparin (LOVENOX) injection  30 mg Subcutaneous Q24H  . ferrous sulfate  325 mg Oral BID  . furosemide  40 mg Intravenous Q12H  . glipiZIDE  5 mg Oral BID WC  . insulin aspart  0-15 Units Subcutaneous TID WC  . insulin aspart  0-5 Units Subcutaneous QHS  . levothyroxine  175 mcg Oral Q0600  . pantoprazole  40 mg Oral Daily  . predniSONE  5 mg Oral Q breakfast  . tacrolimus  2 mg Oral Q12H  . temazepam  15-30 mg Oral QHS   Continuous Infusions:  PRN Meds: acetaminophen **OR** acetaminophen, albuterol, metoprolol tartrate, ondansetron **OR** ondansetron (ZOFRAN) IV, polyethylene glycol  Allergies:    Allergies  Allergen Reactions  .  Penicillins Itching and Swelling    Swelling and flushing Has patient had a PCN reaction causing immediate rash, facial/tongue/throat swelling, SOB or lightheadedness with hypotension: yes Has patient had a PCN reaction causing severe rash involving mucus membranes or skin necrosis: no Has patient had a PCN reaction that required hospitalization: no Has patient had a PCN reaction occurring within the last 10 years: no If all of the above answers are "NO", then may proceed with Cephalosporin use.  . Banana Nausea Only    Green bananas   . Detrol [Tolterodine]     Leg cramps  . Quetiapine Other (See Comments)    Leg cramps/restless leg  . Risperidone And Related     Unknown reaction  . Lisinopril Cough    Social History:   Social History   Socioeconomic History  . Marital status: Widowed    Spouse name: Not on file  . Number of children: Not on file  . Years of education: Not on file  . Highest education level: Not on file  Occupational History  . Not on file  Tobacco Use  . Smoking status: Former    Types: Cigarettes    Quit date: 09/25/1983    Years since quitting: 38.7  . Smokeless tobacco: Never  Vaping Use  . Vaping Use: Never used  Substance and Sexual Activity  . 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 Drug use: No  . Sexual activity: Never  Other Topics Concern  . Not on file  Social History Narrative  . Not on file   Social Determinants of Health   Financial Resource Strain: Low Risk  (01/22/2020)   Overall Financial Resource Strain (CARDIA)   . Difficulty of Paying Living Expenses: Not hard at all  Food Insecurity: No Food Insecurity (06/30/2022)   Hunger Vital Sign   . Worried About Programme researcher, broadcasting/film/video in the Last Year: Never true   . Ran Out of Food in  the Last Year: Never true  Transportation Needs: No Transportation Needs (06/30/2022)   PRAPARE - Transportation   .  Lack of Transportation (Medical): No   . Lack of Transportation (Non-Medical): No  Physical Activity: Insufficiently Active (01/22/2020)   Exercise Vital Sign   . Days of Exercise per Week: 5 days   . Minutes of Exercise per Session: 20 min  Stress: Not on file  Social Connections: Moderately Isolated (01/22/2020)   Social Connection and Isolation Panel [NHANES]   . Frequency of Communication with Friends and Family: More than three times a week   . Frequency of Social Gatherings with Friends and Family: Never   . Attends Religious Services: Never   . Active Member of Clubs or Organizations: Yes   . Attends Banker Meetings: Never   . Marital Status: Widowed  Intimate Partner Violence: Not At Risk (06/30/2022)   Humiliation, Afraid, Rape, and Kick questionnaire   . Fear of Current or Ex-Partner: No   . Emotionally Abused: No   . Physically Abused: No   . Sexually Abused: No    Family History:    Family History  Problem Relation Age of Onset  . Hypertension Mother   . Diabetes Father   . Hypertension Father   . Hypertension Sister   . Dementia Sister   . Multiple myeloma Sister   . Thyroid cancer Daughter 1  . Diabetes Brother   . Colon polyps Brother   . Atrial fibrillation Brother   . Breast cancer Neg Hx      ROS:  Please see the history of present illness.   All other ROS reviewed and negative.     Physical Exam/Data:   Vitals:   06/29/22 2340 06/29/22 2346 06/30/22 0420 06/30/22 0818  BP: (!) 142/55  (!) 146/55 (!) 136/52  Pulse: 61  (!) 58 62  Resp: 20  20 (!) 22  Temp: 97.6 F (36.4 C)  (!) 97.4 F (36.3 C) 97.8 F (36.6 C)  TempSrc: Oral  Oral Oral  SpO2: 98%  95% 97%  Weight:  69.1 kg 69.1 kg   Height:  4\' 11"  (1.499 m)      Intake/Output Summary (Last 24 hours) at 06/30/2022 1031 Last data filed at 06/30/2022 1005 Gross per 24 hour  Intake 60 ml  Output 2550 ml  Net -2490 ml      06/30/2022    4:20 AM 06/29/2022   11:46 PM 06/20/2022    12:56 PM  Last 3 Weights  Weight (lbs) 152 lb 5.4 oz 152 lb 5.4 oz 135 lb 9.6 oz  Weight (kg) 69.1 kg 69.1 kg 61.508 kg     Body mass index is 30.77 kg/m.  General:  Well nourished, well developed, in no acute distress HEENT: normal Neck: ++ JVD Vascular: No carotid bruits; Distal pulses 2+ bilaterally Cardiac:  normal S1, S2; RRR; no murmur  Lungs:  clear to auscultation bilaterally, no wheezing, rhonchi or rales  Abd: soft, nontender, no hepatomegaly  Ext: 1+ BL edema Musculoskeletal:  No deformities, BUE and BLE strength normal and equal Skin: warm and dry  Neuro:  CNs 2-12 intact, no focal abnormalities noted Psych:  Normal affect   EKG:  The EKG was personally reviewed and demonstrates:  sinus rhythm with baseline artifact. No acute ischemic changes. Sinus bradycardia HR 55 bpm  Telemetry:  Telemetry was personally reviewed and demonstrates:  sinus bradycardia HR in the 50s  Relevant CV Studies:  03/18/20 TTE IMPRESSIONS  1. Left ventricular ejection fraction, by estimation, is 70 to 75%. The  left ventricle has hyperdynamic function. The left ventricle has no  regional wall motion abnormalities. Left ventricular diastolic parameters  are consistent with Grade I diastolic  dysfunction (impaired relaxation). The average left ventricular global  longitudinal strain is 28.3 %. The global longitudinal strain is normal.   2. Right ventricular systolic function is normal. The right ventricular  size is normal. There is normal pulmonary artery systolic pressure.   3. The mitral valve is grossly normal. Trivial mitral valve  regurgitation.   4. The aortic valve was not well visualized. Aortic valve regurgitation  is not visualized. Mild aortic valve sclerosis is present, with no  evidence of aortic valve stenosis.   5. The inferior vena cava is normal in size with greater than 50%  respiratory variability, suggesting right atrial pressure of 3 mmHg.   Comparison(s):  No significant change from prior study. 05/08/2018: LVEF  >65%, grade 1 DD.   FINDINGS   Left Ventricle: Left ventricular ejection fraction, by estimation, is 70  to 75%. The left ventricle has hyperdynamic function. The left ventricle  has no regional wall motion abnormalities. The average left ventricular  global longitudinal strain is 28.3  %. The global longitudinal strain is normal. The left ventricular internal  cavity size was normal in size. There is no left ventricular hypertrophy.  Left ventricular diastolic parameters are consistent with Grade I  diastolic dysfunction (impaired  relaxation). Indeterminate filling pressures.   Right Ventricle: The right ventricular size is normal. No increase in  right ventricular wall thickness. Right ventricular systolic function is  normal. There is normal pulmonary artery systolic pressure. The tricuspid  regurgitant velocity is 2.62 m/s, and   with an assumed right atrial pressure of 3 mmHg, the estimated right  ventricular systolic pressure is 30.5 mmHg.   Left Atrium: Left atrial size was normal in size.   Right Atrium: Right atrial size was normal in size.   Pericardium: There is no evidence of pericardial effusion.   Mitral Valve: The mitral valve is grossly normal. Trivial mitral valve  regurgitation.   Tricuspid Valve: The tricuspid valve is grossly normal. Tricuspid valve  regurgitation is trivial.   Aortic Valve: The aortic valve was not well visualized. Aortic valve  regurgitation is not visualized. Mild aortic valve sclerosis is present,  with no evidence of aortic valve stenosis.   Pulmonic Valve: The pulmonic valve was not well visualized. Pulmonic valve  regurgitation is not visualized.   Aorta: The aortic root and ascending aorta are structurally normal, with  no evidence of dilitation.   Venous: The inferior vena cava is normal in size with greater than 50%  respiratory variability, suggesting right atrial  pressure of 3 mmHg.   IAS/Shunts: No atrial level shunt detected by color flow Doppler.    Laboratory Data:  High Sensitivity Troponin:   Recent Labs  Lab 06/29/22 1953  TROPONINIHS 11     Chemistry Recent Labs  Lab 06/29/22 1953 06/30/22 0413  NA 137 138  K 5.1 4.3  CL 105 105  CO2 23 27  GLUCOSE 284* 227*  BUN 46* 47*  CREATININE 2.16* 2.23*  CALCIUM 8.2* 8.2*  GFRNONAA 22* 21*  ANIONGAP 9 6    Recent Labs  Lab 06/29/22 1953  PROT 6.4*  ALBUMIN 3.7  AST 26  ALT 25  ALKPHOS 72  BILITOT 0.7   Lipids No results for input(s): "CHOL", "TRIG", "HDL", "LABVLDL", "  LDLCALC", "CHOLHDL" in the last 168 hours.  Hematology Recent Labs  Lab 06/29/22 1953 06/30/22 0413  WBC 4.7 3.1*  RBC 2.54* 2.37*  HGB 8.5* 7.8*  HCT 27.3* 25.2*  MCV 107.5* 106.3*  MCH 33.5 32.9  MCHC 31.1 31.0  RDW 14.3 14.1  PLT 145* 133*   Thyroid No results for input(s): "TSH", "FREET4" in the last 168 hours.  BNP Recent Labs  Lab 06/29/22 1954  BNP 900.7*    DDimer No results for input(s): "DDIMER" in the last 168 hours.   Radiology/Studies:  VAS [place] LOWER EXTREMITY VENOUS (DVT)  Result Date: 06/30/2022  Lower Venous DVT Study Patient Name:  ZAKYLA TONCHE  Date of Exam:   06/30/2022 Medical Rec #: [id number]        Accession #:    [id number] Date of Birth: 1940/08/27        Patient Gender: F Patient Age:   61 years Exam Location:  [facility] Procedure:      VAS [place] LOWER EXTREMITY VENOUS (DVT) Referring Phys: MIR [facility] --------------------------------------------------------------------------------  Indications: Edema.  Risk Factors: None identified. Limitations: Poor ultrasound/tissue interface and patient pain tolerance. Comparison Study: No prior studies. Performing Technologist: Chanda Busing RVT  Examination Guidelines: A complete evaluation includes B-mode imaging, spectral Doppler, color Doppler, and power Doppler as needed of all accessible portions of each vessel.  Bilateral testing is considered an integral part of a complete examination. Limited examinations for reoccurring indications may be performed as noted. The reflux portion of the exam is performed with the patient in reverse Trendelenburg.  +---------+---------------+---------+-----------+----------+--------------+ RIGHT    CompressibilityPhasicitySpontaneityPropertiesThrombus Aging +---------+---------------+---------+-----------+----------+--------------+ CFV      Full           Yes      Yes                                 +---------+---------------+---------+-----------+----------+--------------+ SFJ      Full                                                        +---------+---------------+---------+-----------+----------+--------------+ FV Prox  Full                                                        +---------+---------------+---------+-----------+----------+--------------+ FV Mid   Full                                                        +---------+---------------+---------+-----------+----------+--------------+ FV Distal               Yes      Yes                                 +---------+---------------+---------+-----------+----------+--------------+ PFV      Full                                                        +---------+---------------+---------+-----------+----------+--------------+  POP      Full           Yes      Yes                                 +---------+---------------+---------+-----------+----------+--------------+ PTV      Full                                                        +---------+---------------+---------+-----------+----------+--------------+ PERO     Full                                                        +---------+---------------+---------+-----------+----------+--------------+   +----+---------------+---------+-----------+----------+--------------+  LEFTCompressibilityPhasicitySpontaneityPropertiesThrombus Aging +----+---------------+---------+-----------+----------+--------------+ CFV Full           Yes      Yes                                 +----+---------------+---------+-----------+----------+--------------+    Summary: RIGHT: - There is no evidence of deep vein thrombosis in the lower extremity. However, portions of this examination were limited- see technologist comments above.  - No cystic structure found in the popliteal fossa.  LEFT: - No evidence of common femoral vein obstruction.  *See table(s) above for measurements and observations.    Preliminary    DG CHEST PORT 1 VIEW  Result Date: 06/29/2022 CLINICAL DATA:  Several day history of shortness of breath EXAM: PORTABLE CHEST 1 VIEW COMPARISON:  Chest radiograph dated 03/06/2022 FINDINGS: Normal lung volumes. No focal consolidations. Blunting of the right costophrenic angle. The heart size and mediastinal contours are within normal limits. Old healed right lateral rib fractures. IMPRESSION: Trace right pleural effusion.  No focal consolidations. Electronically Signed   By: Agustin CreeLimin  Xu M.D.   On: 06/29/2022 20:20     Assessment and Plan:   HFpEF Her weight is up and she had JVD c/f CHF exacerbation. BNP elevated to 900.Has received IV lasix 60mg  and 40mg  with net output of 2.4L.  Echocardiogram today Continue IV diuresis  CKD   Hypercholesterolemia  Continue Crestor 5mg  twice weekly.   Risk Assessment/Risk Scores:  {Complete the following score calculators/questions to meet required metrics.  Press F2         :[id number]}   {Is the patient being seen for unstable angina, ACS, NSTEMI or STEMI?:[phone]} {Does this patient have CHF or CHF symptoms?      :[id number]} {Does this patient have ATRIAL FIBRILLATION?:[phone]}  {Are we signing off today?:[id number]}  For questions or updates, please contact [facility] Please consult [email or web address] for  contact info under    Signed, Perlie Goldvan Williams, PA-C  06/30/2022 10:31 AM

## 2022-06-30 NOTE — Progress Notes (Signed)
PROGRESS NOTE    Anita Pollard  YNW:[id number] DOB: 07-Jul-1940 DOA: 06/29/2022 PCP: Anita Floro, MD    Brief Narrative:  Anita Pollard is a 82 y.o. female with medical history significant for kidney transplant, hypertension, hyperlipidemia, insulin-dependent type 2 diabetes, aortic sclerosis, chronic diastolic congestive heart failure being admitted to the hospital with dyspnea and bilateral leg swelling due to acute on chronic diastolic congestive heart failure.  History is provided by the patient, who says that for the last couple weeks, she has been having worsening bilateral lower extremity edema.  She has chronic left lower extremity edema, was told she has lymphedema.  Patient also has right lower extremity swelling for the last week or so.  She has also been feeling shortness of breath, but denies orthopnea.  She normally takes Lasix 40 mg p.o. twice daily, increased it to 60 mg p.o. twice daily for the last couple days, but with no response.  She therefore agreed to come to the ER for further evaluation and management.    Assessment and Plan: Acute on chronic diastolic CHF (congestive heart failure) -with lower extremity edema, elevated BNP.  She also has a history of aortic sclerosis which could cause heart failure symptoms.  Last echo in our system is 82 years old.  - IV Lasix  -Check 2D echo -Continue Coreg patient -cards consult -daily weights, strict I/Os    Normocytic normochromic anemia -continue to ferrous sulfate    DM2 (diabetes mellitus, type 2)-diabetic diet when eating, with sliding scale insulin, will continue her basal 5 units Lantus every morning    AKI (acute kidney injury) (HCC)-possibly due to fluid overload status, will diurese and monitor renal function daily.  Low threshold for renal ultrasound, renal consult given history of renal transplant.  GERD-continue oral Protonix  History of renal transplant-continue prednisone 5 mg p.o. daily, and Prograf  2 mg p.o. twice daily    Lower extremity edema- -duplex negative    Pure hypercholesterolemia -she takes statin twice weekly      DVT prophylaxis: enoxaparin (LOVENOX) injection 30 mg Start: 06/30/22 1000 SCDs Start: 06/29/22 2234    Code Status: Full Code Family Communication: at bedside  Disposition Plan:  Level of care: Telemetry Status is: Observation The patient will require care spanning > 2 midnights and should be moved to inpatient because: IV diuresis    Consultants:  cards   Subjective: Still with LE swelling  Objective: Vitals:   06/29/22 2340 06/29/22 2346 06/30/22 0420 06/30/22 0818  BP: (!) 142/55  (!) 146/55 (!) 136/52  Pulse: 61  (!) 58 62  Resp: 20  20 (!) 22  Temp: 97.6 F (36.4 C)  (!) 97.4 F (36.3 C) 97.8 F (36.6 C)  TempSrc: Oral  Oral Oral  SpO2: 98%  95% 97%  Weight:  69.1 kg 69.1 kg   Height:   (1.499 m)      Intake/Output Summary (Last 24 hours) at 06/30/2022 1249 Last data filed at 06/30/2022 1145 Gross per 24 hour  Intake 60 ml  Output 3450 ml  Net -3390 ml   Filed Weights   06/29/22 2346 06/30/22 0420  Weight: 69.1 kg 69.1 kg    Examination:   General: Appearance:    Obese female in no acute distress     Lungs:     respirations unlabored  Heart:    Normal heart rate. Normal rhythm. No murmurs, rubs, or gallops.    MS:   All extremities are  intact. +skin changes in LE   Neurologic:   Awake, alert, oriented x 3. No apparent focal neurological           defect.        Data Reviewed: I have personally reviewed following labs and imaging studies  CBC: Recent Labs  Lab 06/29/22 1953 06/30/22 0413  WBC 4.7 3.1*  NEUTROABS 3.6  --   HGB 8.5* 7.8*  HCT 27.3* 25.2*  MCV 107.5* 106.3*  PLT 145* 133*   Basic Metabolic Panel: Recent Labs  Lab 06/29/22 1953 06/30/22 0413  NA 137 138  K 5.1 4.3  CL 105 105  CO2 23 27  GLUCOSE 284* 227*  BUN 46* 47*  CREATININE 2.16* 2.23*  CALCIUM 8.2* 8.2*    GFR: Estimated Creatinine Clearance: 16.2 mL/min (A) (by C-G formula based on SCr of 2.23 mg/dL (H)). Liver Function Tests: Recent Labs  Lab 06/29/22 1953  AST 26  ALT 25  ALKPHOS 72  BILITOT 0.7  PROT 6.4*  ALBUMIN 3.7   No results for input(s): "LIPASE", "AMYLASE" in the last 168 hours. No results for input(s): "AMMONIA" in the last 168 hours. Coagulation Profile: No results for input(s): "INR", "PROTIME" in the last 168 hours. Cardiac Enzymes: No results for input(s): "CKTOTAL", "CKMB", "CKMBINDEX", "TROPONINI" in the last 168 hours. BNP (last 3 results) No results for input(s): "PROBNP" in the last 8760 hours. HbA1C: No results for input(s): "HGBA1C" in the last 72 hours. CBG: Recent Labs  Lab 06/29/22 2338 06/30/22 0756 06/30/22 1236  GLUCAP 301* 169* 162*   Lipid Profile: No results for input(s): "CHOL", "HDL", "LDLCALC", "TRIG", "CHOLHDL", "LDLDIRECT" in the last 72 hours. Thyroid Function Tests: No results for input(s): "TSH", "T4TOTAL", "FREET4", "T3FREE", "THYROIDAB" in the last 72 hours. Anemia Panel: No results for input(s): "VITAMINB12", "FOLATE", "FERRITIN", "TIBC", "IRON", "RETICCTPCT" in the last 72 hours. Sepsis Labs: No results for input(s): "PROCALCITON", "LATICACIDVEN" in the last 168 hours.  No results found for this or any previous visit (from the past 240 hour(s)).       Radiology Studies: VAS [place] LOWER EXTREMITY VENOUS (DVT)  Result Date: 06/30/2022  Lower Venous DVT Study Patient Name:  Anita Pollard  Date of Exam:   06/30/2022 Medical Rec #: [id number]        Accession #:    [id number] Date of Birth: May 13, 1940        Patient Gender: F Patient Age:   22 years Exam Location:  [facility] Procedure:      VAS [place] LOWER EXTREMITY VENOUS (DVT) Referring Phys: MIR [facility] --------------------------------------------------------------------------------  Indications: Edema.  Risk Factors: None identified. Limitations: Poor  ultrasound/tissue interface and patient pain tolerance. Comparison Study: No prior studies. Performing Technologist: Chanda Busing RVT  Examination Guidelines: A complete evaluation includes B-mode imaging, spectral Doppler, color Doppler, and power Doppler as needed of all accessible portions of each vessel. Bilateral testing is considered an integral part of a complete examination. Limited examinations for reoccurring indications may be performed as noted. The reflux portion of the exam is performed with the patient in reverse Trendelenburg.  +---------+---------------+---------+-----------+----------+--------------+ RIGHT    CompressibilityPhasicitySpontaneityPropertiesThrombus Aging +---------+---------------+---------+-----------+----------+--------------+ CFV      Full           Yes      Yes                                 +---------+---------------+---------+-----------+----------+--------------+ SFJ  Full                                                        +---------+---------------+---------+-----------+----------+--------------+ FV Prox  Full                                                        +---------+---------------+---------+-----------+----------+--------------+ FV Mid   Full                                                        +---------+---------------+---------+-----------+----------+--------------+ FV Distal               Yes      Yes                                 +---------+---------------+---------+-----------+----------+--------------+ PFV      Full                                                        +---------+---------------+---------+-----------+----------+--------------+ POP      Full           Yes      Yes                                 +---------+---------------+---------+-----------+----------+--------------+ PTV      Full                                                         +---------+---------------+---------+-----------+----------+--------------+ PERO     Full                                                        +---------+---------------+---------+-----------+----------+--------------+   +----+---------------+---------+-----------+----------+--------------+ LEFTCompressibilityPhasicitySpontaneityPropertiesThrombus Aging +----+---------------+---------+-----------+----------+--------------+ CFV Full           Yes      Yes                                 +----+---------------+---------+-----------+----------+--------------+    Summary: RIGHT: - There is no evidence of deep vein thrombosis in the lower extremity. However, portions of this examination were limited- see technologist comments above.  - No cystic structure found in the popliteal fossa.  LEFT: - No evidence of common femoral vein obstruction.  *See table(s) above for measurements and observations.    Preliminary  DG CHEST PORT 1 VIEW  Result Date: 06/29/2022 CLINICAL DATA:  Several day history of shortness of breath EXAM: PORTABLE CHEST 1 VIEW COMPARISON:  Chest radiograph dated 03/06/2022 FINDINGS: Normal lung volumes. No focal consolidations. Blunting of the right costophrenic angle. The heart size and mediastinal contours are within normal limits. Old healed right lateral rib fractures. IMPRESSION: Trace right pleural effusion.  No focal consolidations. Electronically Signed   By: Agustin Cree M.D.   On: 06/29/2022 20:20        Scheduled Meds:  amLODipine  5 mg Oral QHS   carvedilol  6.25 mg Oral BID WC   docusate sodium  100 mg Oral BID   enoxaparin (LOVENOX) injection  30 mg Subcutaneous Q24H   ferrous sulfate  325 mg Oral BID   furosemide  40 mg Intravenous Q12H   glipiZIDE  5 mg Oral BID WC   insulin aspart  0-15 Units Subcutaneous TID WC   insulin aspart  0-5 Units Subcutaneous QHS   levothyroxine  175 mcg Oral Q0600   pantoprazole  40 mg Oral Daily   predniSONE  5 mg Oral  Q breakfast   tacrolimus  2 mg Oral Q12H   temazepam  15-30 mg Oral QHS   Continuous Infusions:   LOS: 0 days    Time spent: 45 minutes spent on chart review, discussion with nursing staff, consultants, updating family and interview/physical exam; more than 50% of that time was spent in counseling and/or coordination of care.    Joseph Art, DO [facility] Available via Epic secure chat 7am-7pm After these hours, please refer to coverage provider listed on [email or web address] 06/30/2022, 12:49 PM

## 2022-07-01 ENCOUNTER — Inpatient Hospital Stay (HOSPITAL_COMMUNITY): Payer: Medicare PPO

## 2022-07-01 DIAGNOSIS — I5021 Acute systolic (congestive) heart failure: Secondary | ICD-10-CM

## 2022-07-01 DIAGNOSIS — I5033 Acute on chronic diastolic (congestive) heart failure: Secondary | ICD-10-CM | POA: Diagnosis not present

## 2022-07-01 LAB — CBC
HCT: 26.7 % — ABNORMAL LOW (ref 36.0–46.0)
Hemoglobin: 8.6 g/dL — ABNORMAL LOW (ref 12.0–15.0)
MCH: 33.6 pg (ref 26.0–34.0)
MCHC: 32.2 g/dL (ref 30.0–36.0)
MCV: 104.3 fL — ABNORMAL HIGH (ref 80.0–100.0)
Platelets: 148 10*3/uL — ABNORMAL LOW (ref 150–400)
RBC: 2.56 MIL/uL — ABNORMAL LOW (ref 3.87–5.11)
RDW: 13.7 % (ref 11.5–15.5)
WBC: 3.6 10*3/uL — ABNORMAL LOW (ref 4.0–10.5)
nRBC: 0 % (ref 0.0–0.2)

## 2022-07-01 LAB — BASIC METABOLIC PANEL
Anion gap: 10 (ref 5–15)
BUN: 45 mg/dL — ABNORMAL HIGH (ref 8–23)
CO2: 29 mmol/L (ref 22–32)
Calcium: 8.2 mg/dL — ABNORMAL LOW (ref 8.9–10.3)
Chloride: 102 mmol/L (ref 98–111)
Creatinine, Ser: 1.99 mg/dL — ABNORMAL HIGH (ref 0.44–1.00)
GFR, Estimated: 24 mL/min — ABNORMAL LOW (ref >60)
Glucose, Bld: 147 mg/dL — ABNORMAL HIGH (ref 70–99)
Potassium: 3.3 mmol/L — ABNORMAL LOW (ref 3.5–5.1)
Sodium: 141 mmol/L (ref 135–145)

## 2022-07-01 LAB — URINE CULTURE: Culture: >=100000 — AB

## 2022-07-01 LAB — ECHOCARDIOGRAM COMPLETE
AR max vel: 2.13 cm2
AV Area VTI: 2.05 cm2
AV Area mean vel: 2.07 cm2
AV Mean grad: 7 mmHg
AV Peak grad: 12.5 mmHg
Ao pk vel: 1.77 m/s
Area-P 1/2: 3.05 cm2
Height: 59 in
S' Lateral: 2.4 cm
Weight: 2074.09 oz

## 2022-07-01 LAB — GLUCOSE, CAPILLARY
Glucose-Capillary: 169 mg/dL — ABNORMAL HIGH (ref 70–99)
Glucose-Capillary: 115 mg/dL — ABNORMAL HIGH (ref 70–99)
Glucose-Capillary: 306 mg/dL — ABNORMAL HIGH (ref 70–99)
Glucose-Capillary: 140 mg/dL — ABNORMAL HIGH (ref 70–99)

## 2022-07-01 MED ORDER — PRAMIPEXOLE DIHYDROCHLORIDE 0.25 MG PO TABS
0.2500 mg | ORAL_TABLET | Freq: Every day | ORAL | Status: DC
  Administered 2022-07-01: 0.25 mg via ORAL
  Filled 2022-07-01: qty 1

## 2022-07-01 MED ORDER — PANCRELIPASE (LIP-PROT-AMYL) 12000-38000 UNITS PO CPEP
12000.0000 [IU] | ORAL_CAPSULE | Freq: Three times a day (TID) | ORAL | Status: DC
  Administered 2022-07-01 – 2022-07-02 (×2): 12000 [IU] via ORAL
  Filled 2022-07-01 (×2): qty 1

## 2022-07-01 MED ORDER — MELATONIN 5 MG PO TABS
5.0000 mg | ORAL_TABLET | ORAL | Status: DC
  Administered 2022-07-01: 5 mg via ORAL
  Filled 2022-07-01: qty 1

## 2022-07-01 MED ORDER — CYANOCOBALAMIN 500 MCG PO TABS
500.0000 ug | ORAL_TABLET | Freq: Every day | ORAL | Status: DC
  Administered 2022-07-01 – 2022-07-02 (×2): 500 ug via ORAL
  Filled 2022-07-01 (×2): qty 1

## 2022-07-01 MED ORDER — PRAMIPEXOLE DIHYDROCHLORIDE 0.25 MG PO TABS: 0.2500 mg | ORAL_TABLET | ORAL | Status: DC

## 2022-07-01 MED ORDER — PRAMIPEXOLE DIHYDROCHLORIDE 0.25 MG PO TABS
0.5000 mg | ORAL_TABLET | ORAL | Status: DC
  Administered 2022-07-01: 0.5 mg via ORAL
  Filled 2022-07-01: qty 2

## 2022-07-01 MED ORDER — POTASSIUM CHLORIDE CRYS ER 20 MEQ PO TBCR
40.0000 meq | EXTENDED_RELEASE_TABLET | Freq: Once | ORAL | Status: AC
  Administered 2022-07-01: 40 meq via ORAL
  Filled 2022-07-01: qty 2

## 2022-07-01 MED ORDER — PANCRELIPASE (LIP-PROT-AMYL) 3000-9500 UNITS PO CPEP: 3.0000 | ORAL_CAPSULE | Freq: Three times a day (TID) | ORAL | Status: DC

## 2022-07-01 MED ORDER — FUROSEMIDE 10 MG/ML IJ SOLN
40.0000 mg | Freq: Two times a day (BID) | INTRAMUSCULAR | Status: DC
  Administered 2022-07-01 – 2022-07-02 (×3): 40 mg via INTRAVENOUS
  Filled 2022-07-01 (×3): qty 4

## 2022-07-01 MED ORDER — CEFADROXIL 500 MG PO CAPS
500.0000 mg | ORAL_CAPSULE | Freq: Every day | ORAL | Status: DC
  Administered 2022-07-01 – 2022-07-02 (×2): 500 mg via ORAL
  Filled 2022-07-01 (×2): qty 1

## 2022-07-01 NOTE — Progress Notes (Addendum)
PROGRESS NOTE    Anita Pollard  ZOX:[id number] DOB: April 25, 1940 DOA: 06/29/2022 PCP: [name], MD    Brief Narrative:  Anita Pollard is a 82 y.o. female with medical history significant for kidney transplant, hypertension, hyperlipidemia, insulin-dependent type 2 diabetes, aortic sclerosis, chronic diastolic congestive heart failure being admitted to the hospital with dyspnea and bilateral leg swelling due to acute on chronic diastolic congestive heart failure.  History is provided by the patient, who says that for the last couple weeks, she has been having worsening bilateral lower extremity edema.  She has chronic left lower extremity edema, was told she has lymphedema.  Patient also has right lower extremity swelling for the last week or so.  She has also been feeling shortness of breath, but denies orthopnea.  She normally takes Lasix 40 mg p.o. twice daily, increased it to 60 mg p.o. twice daily for the last couple days, but with no response.  She therefore agreed to come to the ER for further evaluation and management.    Assessment and Plan: Acute on chronic diastolic CHF (congestive heart failure) -with lower extremity edema, elevated BNP.  She also has a history of aortic sclerosis which could cause heart failure symptoms.  Last echo in our system is 82 years old. - IV Lasix  - 2D echo pending -Continue Coreg patient -cards consult appreciated -daily weights, strict I/Os    Normocytic normochromic anemia -continue to ferrous sulfate    DM2 (diabetes mellitus, type 2) -SSI    AKI (acute kidney injury) (HCC) on CKD stage IIIa -Cr trending down -baseline: 1.6-2  GERD-continue oral Protonix  History of renal transplant-continue prednisone 5 mg p.o. daily, and Prograf 2 mg p.o. twice daily    Lower extremity edema- -duplex negative    Pure hypercholesterolemia -she takes statin twice weekly  Hypokalemia -replete  UTI -3 day course of PO abx   DVT  prophylaxis: heparin injection 5,000 Units Start: 07/01/22 1000 SCDs Start: 06/29/22 2234    Code Status: Full Code Family Communication: at bedside  Disposition Plan:  Level of care: Telemetry Status is: inpatient     Consultants:  cards   Subjective: Up all night urinating  Objective: Vitals:   06/30/22 1433 06/30/22 2057 07/01/22 0425 07/01/22 0700  BP: (!) 140/61 (!) 141/53 (!) 144/55 (!) 147/59  Pulse: (!) 59 (!) 58 (!) 58 61  Resp: (!) Temp: 98.2 F (36.8 C) 97.9 F (36.6 C) 97.8 F (36.6 C) 98.1 F (36.7 C)  TempSrc:  Oral Oral Oral  SpO2: 98% 96% 96% 95%  Weight:   58.8 kg   Height:        Intake/Output Summary (Last 24 hours) at 07/01/2022 1025 Last data filed at 07/01/2022 0800 Gross per 24 hour  Intake 520 ml  Output 3150 ml  Net -2630 ml   Filed Weights   06/29/22 2346 06/30/22 0420 07/01/22 0425  Weight: 69.1 kg 69.1 kg 58.8 kg    Examination:    General: Appearance:     Overweight female in no acute distress     Lungs:     respirations unlabored  Heart:    Normal heart rate. + LE edema with chronic skin changes  MS:   All extremities are intact.   Neurologic:   Awake, alert, oriented x 3. No apparent focal neurological           defect.        Data Reviewed:  I have personally reviewed following labs and imaging studies  CBC: Recent Labs  Lab 06/29/22 1953 06/30/22 0413 07/01/22 0514  WBC 4.7 3.1* 3.6*  NEUTROABS 3.6  --   --   HGB 8.5* 7.8* 8.6*  HCT 27.3* 25.2* 26.7*  MCV 107.5* 106.3* 104.3*  PLT 145* 133* 148*   Basic Metabolic Panel: Recent Labs  Lab 06/29/22 1953 06/30/22 0413 07/01/22 0514  NA 137 138 141  K 5.1 4.3 3.3*  CL 105 105 102  CO2 23 27 29   GLUCOSE 284* 227* 147*  BUN 46* 47* 45*  CREATININE 2.16* 2.23* 1.99*  CALCIUM 8.2* 8.2* 8.2*   GFR: Estimated Creatinine Clearance: 16.7 mL/min (A) (by C-G formula based on SCr of 1.99 mg/dL (H)). Liver Function Tests: Recent Labs  Lab  06/29/22 1953  AST 26  ALT 25  ALKPHOS 72  BILITOT 0.7  PROT 6.4*  ALBUMIN 3.7   No results for input(s): "LIPASE", "AMYLASE" in the last 168 hours. No results for input(s): "AMMONIA" in the last 168 hours. Coagulation Profile: No results for input(s): "INR", "PROTIME" in the last 168 hours. Cardiac Enzymes: No results for input(s): "CKTOTAL", "CKMB", "CKMBINDEX", "TROPONINI" in the last 168 hours. BNP (last 3 results) No results for input(s): "PROBNP" in the last 8760 hours. HbA1C: Recent Labs    06/30/22 0413  HGBA1C 8.2*   CBG: Recent Labs  Lab 06/30/22 0756 06/30/22 1236 06/30/22 1637 06/30/22 2048 07/01/22 0747  GLUCAP 169* 162* 180* 252* 169*   Lipid Profile: No results for input(s): "CHOL", "HDL", "LDLCALC", "TRIG", "CHOLHDL", "LDLDIRECT" in the last 72 hours. Thyroid Function Tests: No results for input(s): "TSH", "T4TOTAL", "FREET4", "T3FREE", "THYROIDAB" in the last 72 hours. Anemia Panel: No results for input(s): "VITAMINB12", "FOLATE", "FERRITIN", "TIBC", "IRON", "RETICCTPCT" in the last 72 hours. Sepsis Labs: No results for input(s): "PROCALCITON", "LATICACIDVEN" in the last 168 hours.  Recent Results (from the past 240 hour(s))  Urine Culture     Status: Abnormal (Preliminary result)   Collection Time: 06/30/22  9:50 AM   Specimen: Urine, Random  Result Value Ref Range Status   Specimen Description   Final    URINE, RANDOM Performed at [facility], [address] [place], [place] [address]    Special Requests   Final    NONE Reflexed from [id number] Performed at [facility], [address], [place], [place] [address]    Culture >=100,000 COLONIES/mL Romie Minus NEGATIVE RODS (A)  Final   Report Status PENDING  Incomplete         Radiology Studies: VAS [place] LOWER EXTREMITY VENOUS (DVT)  Result Date: 06/30/2022  Lower Venous DVT Study Patient Name:  Anita Pollard  Date of Exam:   06/30/2022 Medical Rec #:  [id number]        Accession #:    [id number] Date of Birth: 06/29/1940        Patient Gender: F Patient Age:   27 years Exam Location:  [facility] Procedure:      VAS [place] LOWER EXTREMITY VENOUS (DVT) Referring Phys: MIR [facility] --------------------------------------------------------------------------------  Indications: Edema.  Risk Factors: None identified. Limitations: Poor ultrasound/tissue interface and patient pain tolerance. Comparison Study: No prior studies. Performing Technologist: Chanda Busing RVT  Examination Guidelines: A complete evaluation includes B-mode imaging, spectral Doppler, color Doppler, and power Doppler as needed of all accessible portions of each vessel. Bilateral testing is considered an integral part of a complete examination. Limited examinations for reoccurring indications may  be performed as noted. The reflux portion of the exam is performed with the patient in reverse Trendelenburg.  +---------+---------------+---------+-----------+----------+--------------+ RIGHT    CompressibilityPhasicitySpontaneityPropertiesThrombus Aging +---------+---------------+---------+-----------+----------+--------------+ CFV      Full           Yes      Yes                                 +---------+---------------+---------+-----------+----------+--------------+ SFJ      Full                                                        +---------+---------------+---------+-----------+----------+--------------+ FV Prox  Full                                                        +---------+---------------+---------+-----------+----------+--------------+ FV Mid   Full                                                        +---------+---------------+---------+-----------+----------+--------------+ FV Distal               Yes      Yes                                 +---------+---------------+---------+-----------+----------+--------------+ PFV      Full                                                         +---------+---------------+---------+-----------+----------+--------------+ POP      Full           Yes      Yes                                 +---------+---------------+---------+-----------+----------+--------------+ PTV      Full                                                        +---------+---------------+---------+-----------+----------+--------------+ PERO     Full                                                        +---------+---------------+---------+-----------+----------+--------------+   +----+---------------+---------+-----------+----------+--------------+ LEFTCompressibilityPhasicitySpontaneityPropertiesThrombus Aging +----+---------------+---------+-----------+----------+--------------+ CFV Full           Yes      Yes                                 +----+---------------+---------+-----------+----------+--------------+  Summary: RIGHT: - There is no evidence of deep vein thrombosis in the lower extremity. However, portions of this examination were limited- see technologist comments above.  - No cystic structure found in the popliteal fossa.  LEFT: - No evidence of common femoral vein obstruction.  *See table(s) above for measurements and observations. Electronically signed by Waverly Ferrari MD on 06/30/2022 at 4:04:34 PM.    Final    DG CHEST PORT 1 VIEW  Result Date: 06/29/2022 CLINICAL DATA:  Several day history of shortness of breath EXAM: PORTABLE CHEST 1 VIEW COMPARISON:  Chest radiograph dated 03/06/2022 FINDINGS: Normal lung volumes. No focal consolidations. Blunting of the right costophrenic angle. The heart size and mediastinal contours are within normal limits. Old healed right lateral rib fractures. IMPRESSION: Trace right pleural effusion.  No focal consolidations. Electronically Signed   By: Agustin Cree M.D.   On: 06/29/2022 20:20        Scheduled Meds:  amLODipine  5 mg Oral  QHS   carvedilol  6.25 mg Oral BID WC   docusate sodium  100 mg Oral BID   ferrous sulfate  325 mg Oral BID   furosemide  40 mg Intravenous BID   heparin injection (subcutaneous)  5,000 Units Subcutaneous Q12H   insulin aspart  0-15 Units Subcutaneous TID WC   insulin aspart  0-5 Units Subcutaneous QHS   levothyroxine  175 mcg Oral Q0600   pantoprazole  40 mg Oral Daily   predniSONE  5 mg Oral Q breakfast   tacrolimus  2 mg Oral Q12H   temazepam  15-30 mg Oral QHS   Continuous Infusions:   LOS: 1 day    Time spent: 45 minutes spent on chart review, discussion with nursing staff, consultants, updating family and interview/physical exam; more than 50% of that time was spent in counseling and/or coordination of care.    Joseph Art, DO [facility] Available via Epic secure chat 7am-7pm After these hours, please refer to coverage provider listed on [email or web address] 07/01/2022, 10:25 AM

## 2022-07-01 NOTE — Progress Notes (Signed)
Progress Note  Patient Name: Anita Pollard Date of Encounter: 07/01/2022  Primary Cardiologist: Donato Schultz, MD  Interval Summary   Chart reviewed since cardiology consultation yesterday.  Patient diuresing well.  She has noticed improvement in her leg swelling.  Sitting in bedside chair this morning with no other specific complaints.  Would like to go home soon.  Vital Signs    Vitals:   06/30/22 0818 06/30/22 1433 06/30/22 2057 07/01/22 0425  BP: (!) 136/52 (!) 140/61 (!) 141/53 (!) 144/55  Pulse: 62 (!) 59 (!) 58 (!) 58  Resp: (!) 22 (!) 22 16 18   Temp: 97.8 F (36.6 C) 98.2 F (36.8 C) 97.9 F (36.6 C) 97.8 F (36.6 C)  TempSrc: Oral  Oral Oral  SpO2: 97% 98% 96% 96%  Weight:    58.8 kg  Height:        Intake/Output Summary (Last 24 hours) at 07/01/2022 0726 Last data filed at 07/01/2022 0554 Gross per 24 hour  Intake 480 ml  Output 3850 ml  Net -3370 ml   Filed Weights   06/29/22 2346 06/30/22 0420 07/01/22 0425  Weight: 69.1 kg 69.1 kg 58.8 kg    Physical Exam   GEN: No acute distress.   Neck: No JVD. Cardiac: RRR, no murmur, rub, or gallop.  Respiratory: Nonlabored. Clear to auscultation bilaterally. GI: Soft, nontender, bowel sounds present. MS: No edema. Neuro:  Nonfocal. Psych: Alert and oriented x 3. Normal affect.  ECG/Telemetry    An ECG dated 06/29/2022 was personally reviewed today and demonstrated:  Sinus bradycardia with lead motion artifact.  Telemetry reviewed showing sinus rhythm.  Labs    Chemistry Recent Labs  Lab 06/29/22 1953 06/30/22 0413 07/01/22 0514  NA 137 138 141  K 5.1 4.3 3.3*  CL 105 105 102  CO2 23 27 29   GLUCOSE 284* 227* 147*  BUN 46* 47* 45*  CREATININE 2.16* 2.23* 1.99*  CALCIUM 8.2* 8.2* 8.2*  PROT 6.4*  --   --   ALBUMIN 3.7  --   --   AST 26  --   --   ALT 25  --   --   ALKPHOS 72  --   --   BILITOT 0.7  --   --   GFRNONAA 22* 21* 24*  ANIONGAP 9 6 10     Hematology Recent Labs  Lab  06/29/22 1953 06/30/22 0413 07/01/22 0514  WBC 4.7 3.1* 3.6*  RBC 2.54* 2.37* 2.56*  HGB 8.5* 7.8* 8.6*  HCT 27.3* 25.2* 26.7*  MCV 107.5* 106.3* 104.3*  MCH 33.5 32.9 33.6  MCHC 31.1 31.0 32.2  RDW 14.3 14.1 13.7  PLT 145* 133* 148*   Cardiac Enzymes Recent Labs  Lab 06/29/22 1953  TROPONINIHS 11    Cardiac Studies   Echocardiogram pending.  Assessment & Plan   1.  HFpEF with weight gain and leg edema.  LVEF 70 to 75% with mild diastolic dysfunction and normal RV contraction by echocardiogram in 2021.  Follow-up study is pending.  She is diuresing well on IV Lasix, net output of approximately 3300 cc last 24 hours.  2.  CKD stage IIIb-IV.  Creatinine 1.99 down from 2.23.  She has a history of renal transplantation in 2015.  She is on prednisone and tacrolimus.  3.  Essential hypertension.  Current regimen includes Coreg and Norvasc.  Chart reviewed and symptoms discussed with patient.  She has mainly noticed weight gain (approximately 12 pounds) and leg swelling at home,  admits that she does not use her Lasix regularly when she has to go out the house.  Also reports blood pressure being lower than normal recently.  No chest pain or palpitations.  Plan is to continue IV Lasix and follow-up echocardiogram today.  I suspect she needs at least another day of IV diuretics and can then convert to oral regimen potentially.  Would hold off initiation of SGLT2 inhibitor in light of GFR less than 25.  For questions or updates, please contact [facility] Please consult [email or web address] for contact info under   Signed, Nona Dell, MD  07/01/2022, 7:26 AM

## 2022-07-02 DIAGNOSIS — I5033 Acute on chronic diastolic (congestive) heart failure: Secondary | ICD-10-CM | POA: Diagnosis not present

## 2022-07-02 LAB — BASIC METABOLIC PANEL
Anion gap: 11 (ref 5–15)
BUN: 49 mg/dL — ABNORMAL HIGH (ref 8–23)
CO2: 28 mmol/L (ref 22–32)
Calcium: 8.3 mg/dL — ABNORMAL LOW (ref 8.9–10.3)
Chloride: 100 mmol/L (ref 98–111)
Creatinine, Ser: 2.1 mg/dL — ABNORMAL HIGH (ref 0.44–1.00)
GFR, Estimated: 23 mL/min — ABNORMAL LOW (ref >60)
Glucose, Bld: 163 mg/dL — ABNORMAL HIGH (ref 70–99)
Potassium: 3.3 mmol/L — ABNORMAL LOW (ref 3.5–5.1)
Sodium: 139 mmol/L (ref 135–145)

## 2022-07-02 LAB — CBC
HCT: 27.7 % — ABNORMAL LOW (ref 36.0–46.0)
Hemoglobin: 9 g/dL — ABNORMAL LOW (ref 12.0–15.0)
MCH: 34.1 pg — ABNORMAL HIGH (ref 26.0–34.0)
MCHC: 32.5 g/dL (ref 30.0–36.0)
MCV: 104.9 fL — ABNORMAL HIGH (ref 80.0–100.0)
Platelets: 135 10*3/uL — ABNORMAL LOW (ref 150–400)
RBC: 2.64 MIL/uL — ABNORMAL LOW (ref 3.87–5.11)
RDW: 13.6 % (ref 11.5–15.5)
WBC: 3.3 10*3/uL — ABNORMAL LOW (ref 4.0–10.5)
nRBC: 0 % (ref 0.0–0.2)

## 2022-07-02 LAB — URINE CULTURE

## 2022-07-02 LAB — GLUCOSE, CAPILLARY: Glucose-Capillary: 168 mg/dL — ABNORMAL HIGH (ref 70–99)

## 2022-07-02 MED ORDER — POTASSIUM CHLORIDE CRYS ER 20 MEQ PO TBCR
40.0000 meq | EXTENDED_RELEASE_TABLET | Freq: Once | ORAL | Status: AC
  Administered 2022-07-02: 40 meq via ORAL
  Filled 2022-07-02: qty 2

## 2022-07-02 MED ORDER — CEFADROXIL 500 MG PO CAPS: 500.0000 mg | ORAL_CAPSULE | Freq: Every day | ORAL | 0 refills | Status: DC

## 2022-07-02 MED ORDER — TORSEMIDE 20 MG PO TABS: 60.0000 mg | ORAL_TABLET | Freq: Every day | ORAL | 0 refills | Status: DC

## 2022-07-02 NOTE — Progress Notes (Signed)
Progress Note  Patient Name: Anita Pollard Date of Encounter: 07/02/2022  Primary Cardiologist: Donato SchultzMark Skains, MD  Interval Summary   Chart reviewed.  Patient continues to diurese, leg swelling much improved.  No orthopnea or PND.  We discussed her echocardiogram result.  She would like to go home today.  Vital Signs    Vitals:   07/01/22 2056 07/01/22 2231 07/02/22 0500 07/02/22 0554  BP: (!) 129/50 (!) 143/51  (!) 150/59  Pulse: 60 (!) 59  63  Resp: 20   20  Temp: 98.1 F (36.7 C)   97.9 F (36.6 C)  TempSrc: Oral   Oral  SpO2: 95%   96%  Weight:   56 kg   Height:        Intake/Output Summary (Last 24 hours) at 07/02/2022 0752 Last data filed at 07/02/2022 0516 Gross per 24 hour  Intake 560 ml  Output 2501 ml  Net -1941 ml   Filed Weights   06/30/22 0420 07/01/22 0425 07/02/22 0500  Weight: 69.1 kg 58.8 kg 56 kg    Physical Exam   GEN: No acute distress.   Neck: No JVD. Cardiac: RRR, no murmur, rub, or gallop.  Respiratory: Nonlabored. Clear to auscultation bilaterally. GI: Soft, nontender, bowel sounds present. MS: Improved lower leg edema with venous stasis and mild component of lymphedema.  ECG/Telemetry    Telemetry reviewed showing sinus rhythm.  Labs    Chemistry Recent Labs  Lab 06/29/22 1953 06/30/22 0413 07/01/22 0514 07/02/22 0401  NA 137 138 141 139  K 5.1 4.3 3.3* 3.3*  CL 105 105 102 100  CO2 23 27 29 28   GLUCOSE 284* 227* 147* 163*  BUN 46* 47* 45* 49*  CREATININE 2.16* 2.23* 1.99* 2.10*  CALCIUM 8.2* 8.2* 8.2* 8.3*  PROT 6.4*  --   --   --   ALBUMIN 3.7  --   --   --   AST 26  --   --   --   ALT 25  --   --   --   ALKPHOS 72  --   --   --   BILITOT 0.7  --   --   --   GFRNONAA 22* 21* 24* 23*  ANIONGAP 9 6 10 11     Hematology Recent Labs  Lab 06/30/22 0413 07/01/22 0514 07/02/22 0401  WBC 3.1* 3.6* 3.3*  RBC 2.37* 2.56* 2.64*  HGB 7.8* 8.6* 9.0*  HCT 25.2* 26.7* 27.7*  MCV 106.3* 104.3* 104.9*  MCH 32.9 33.6  34.1*  MCHC 31.0 32.2 32.5  RDW 14.1 13.7 13.6  PLT 133* 148* 135*   Cardiac Enzymes Recent Labs  Lab 06/29/22 1953  TROPONINIHS 11    Cardiac Studies   Echocardiogram 07/01/2022:  1. Left ventricular ejection fraction, by estimation, is 70 to 75%. The  left ventricle has hyperdynamic function. The left ventricle has no  regional wall motion abnormalities. Left ventricular diastolic parameters  are consistent with Grade I diastolic  dysfunction (impaired relaxation). Elevated left atrial pressure.   2. Right ventricular systolic function is normal. The right ventricular  size is normal.   3. The mitral valve is normal in structure. Trivial mitral valve  regurgitation. No evidence of mitral stenosis.   4. The aortic valve is tricuspid. Aortic valve regurgitation is not  visualized. Aortic valve sclerosis is present, with no evidence of aortic  valve stenosis.   5. The inferior vena cava is normal in size with greater than  50%  respiratory variability, suggesting right atrial pressure of 3 mmHg.   Assessment & Plan   1.  HFpEF with weight gain and leg edema.  Follow-up echocardiogram shows LVEF 70 to 75% with increased left atrial pressure and mild diastolic dysfunction, normal RV contraction.  Net urine output of approximately 2000 cc last 24 hours (approximately 7500 cc overall).  Clinically improved.  2.  CKD stage IIIb-IV.  Creatinine 2.1 this morning..  She has a history of renal transplantation in 2015.  She is on prednisone and tacrolimus.  3.  Essential hypertension.  Current regimen includes Coreg and Norvasc.  Discussed plan with patient and son.  Anticipate discharge home today.  She was supposed to have been taking Lasix 40 mg twice daily, but admits that she was really only getting the second dose no more than twice a week.  She had been switched to [facility] by PCP most recently but ended up being hospitalized instead.  Recommend trying Demadex at 60 mg once daily,  reinforced compliance.  Continue potassium supplement.  Otherwise continue baseline hypertensive regimen.  Not good candidate for initiation of SGLT2 inhibitor in light of present GFR less than 25.  She will need follow-up with Dr. Anne Fu or APP in the next few weeks.  Recommend checking BMET in 1 week on new diuretic regimen.  For questions or updates, please contact [facility] Please consult [email or web address] for contact info under   Signed, Nona Dell, MD  07/02/2022, 7:52 AM

## 2022-07-02 NOTE — Discharge Summary (Signed)
Physician Discharge Summary  BAE KUKURA ION:[id number] DOB: Mar 06, 1941 DOA: 06/29/2022  PCP: [name], MD  Admit date: 06/29/2022 Discharge date: 07/02/2022  Admitted From: home Discharge disposition: home   Recommendations for Outpatient Follow-Up:   Weekly BMPs until Cr stable-- started on demadex 60 daily by cards-- close cards follow up Hold ARB/ACE until Cr stable on diuretic Patient to weigh self daily and elevate extremities when sitting    Discharge Diagnosis:   Principal Problem:   Acute on chronic diastolic CHF (congestive heart failure) Active Problems:   HYPERTENSION, BENIGN   Carotid artery disease (HCC)   Normocytic normochromic anemia   DM2 (diabetes mellitus, type 2)   AKI (acute kidney injury) (HCC)   Lower extremity edema   Pure hypercholesterolemia   Acute on chronic diastolic (congestive) heart failure   CHF (congestive heart failure)    Discharge Condition: Improved.  Diet recommendation: Low sodium, heart healthy.  Carbohydrate-modified  Wound care: None.  Code status: Full.   History of Present Illness:   Anita Pollard is a 82 y.o. female with medical history significant for kidney transplant, hypertension, hyperlipidemia, insulin-dependent type 2 diabetes, aortic sclerosis, chronic diastolic congestive heart failure being admitted to the hospital with dyspnea and bilateral leg swelling due to acute on chronic diastolic congestive heart failure.  History is provided by the patient, who says that for the last couple weeks, she has been having worsening bilateral lower extremity edema.  She has chronic left lower extremity edema, was told she has lymphedema.  Patient also has right lower extremity swelling for the last week or so.  She has also been feeling shortness of breath, but denies orthopnea.  She normally takes Lasix 40 mg p.o. twice daily, increased it to 60 mg p.o. twice daily for the last couple days, but with no  response.  She therefore agreed to come to the ER for further evaluation and management.    Hospital Course by Problem:   Acute on chronic diastolic CHF (congestive heart failure) -with lower extremity edema, elevated BNP.  She also has a history of aortic sclerosis which could cause heart failure symptoms.  Last echo in our system is 82 years old. - IV Lasix -- change to PO demadex 60 mg daily - 2D echo done with diastolic CHF -Continue Coreg patient -cards consult appreciated -daily weights, strict I/Os-- if weights here accurate she has lost 30lbs (swelling in LE better)     Normocytic normochromic anemia -continue to ferrous sulfate   Hypokalemia -replete    DM2 (diabetes mellitus, type 2) -SSI     AKI (acute kidney injury) (HCC) on CKD stage IIIb? -Cr trending down -baseline: 1.6-2- recently -needs close follow up with nephrology   GERD-continue oral Protonix   History of renal transplant-continue prednisone 5 mg p.o. daily, and Prograf 2 mg p.o. twice daily     Lower extremity edema- -duplex negative     Pure hypercholesterolemia -she takes statin twice weekly   Hypokalemia -replete   UTI- e coli -3 day course of PO abx    Medical Consultants:   cards   Discharge Exam:   Vitals:   07/01/22 2231 07/02/22 0554  BP: (!) 143/51 (!) 150/59  Pulse: (!) 59 63  Resp:  20  Temp:  97.9 F (36.6 C)  SpO2:  96%   Vitals:   07/01/22 2056 07/01/22 2231 07/02/22 0500 07/02/22 0554  BP: (!) 129/50 (!) 143/51  (!) 150/59  Pulse: 60 (!) 59  63  Resp: 20   20  Temp: 98.1 F (36.7 C)   97.9 F (36.6 C)  TempSrc: Oral   Oral  SpO2: 95%   96%  Weight:   56 kg   Height:        General exam: Appears calm and comfortable.     The results of significant diagnostics from this hospitalization (including imaging, microbiology, ancillary and laboratory) are listed below for reference.     Procedures and Diagnostic Studies:   VAS US LOWER EXTREMITY VENOUS  (DVT)  Result Date: 06/30/2022  Lower Venous DVT Study Patient Name:  Anita Pollard  Date of Exam:   06/30/2022 Medical Rec #: [id number]        Accession #:    [id number] Date of Birth: [birth year]        Patient Gender: F Patient Age:   [age] years [facility] Procedure:      VAS US LOWER EXTREMITY VENOUS (DVT) Referring Phys: [facility] --------------------------------------------------------------------------------  Indications: Edema.  Risk Factors: None identified. Limitations: Poor ultrasound/tissue interface and patient pain tolerance. Comparison Study: No prior studies. Performing Technologist: Chanda BusingGregory Collins RVT  Examination Guidelines: A complete evaluation includes B-mode imaging, spectral Doppler, color Doppler, and power Doppler as needed of all accessible portions of each vessel. Bilateral testing is considered an integral part of a complete examination. Limited examinations for reoccurring indications may be performed as noted. The reflux portion of the exam is performed with the patient in reverse Trendelenburg.  +---------+---------------+---------+-----------+----------+--------------+ RIGHT    CompressibilityPhasicitySpontaneityPropertiesThrombus Aging +---------+---------------+---------+-----------+----------+--------------+ CFV      Full           Yes      Yes                                 +---------+---------------+---------+-----------+----------+--------------+ SFJ      Full                                                        +---------+---------------+---------+-----------+----------+--------------+ FV Prox  Full                                                        +---------+---------------+---------+-----------+----------+--------------+ FV Mid   Full                                                        +---------+---------------+---------+-----------+----------+--------------+ FV Distal               Yes      Yes                                  +---------+---------------+---------+-----------+----------+--------------+ PFV      Full                                                        +---------+---------------+---------+-----------+----------+--------------+  POP      Full           Yes      Yes                                 +---------+---------------+---------+-----------+----------+--------------+ PTV      Full                                                        +---------+---------------+---------+-----------+----------+--------------+ PERO     Full                                                        +---------+---------------+---------+-----------+----------+--------------+   +----+---------------+---------+-----------+----------+--------------+ LEFTCompressibilityPhasicitySpontaneityPropertiesThrombus Aging +----+---------------+---------+-----------+----------+--------------+ CFV Full           Yes      Yes                                 +----+---------------+---------+-----------+----------+--------------+     Summary: RIGHT: - There is no evidence of deep vein thrombosis in the lower extremity. However, portions of this examination were limited- see technologist comments above.  - No cystic structure found in the popliteal fossa.  LEFT: - No evidence of common femoral vein obstruction.  *See table(s) above for measurements and observations. Electronically signed by Waverly Ferrari MD on 06/30/2022 at 4:04:34 PM.    Final    DG CHEST PORT 1 VIEW  Result Date: 06/29/2022 CLINICAL DATA:  Several day history of shortness of breath EXAM: PORTABLE CHEST 1 VIEW COMPARISON:  Chest radiograph dated 03/06/2022 FINDINGS: Normal lung volumes. No focal consolidations. Blunting of the right costophrenic angle. The heart size and mediastinal contours are within normal limits. Old healed right lateral rib fractures. IMPRESSION: Trace right pleural effusion.  No focal  consolidations. Electronically Signed   By: Agustin Cree M.D.   On: 06/29/2022 20:20     Labs:   Basic Metabolic Panel: Recent Labs  Lab 06/29/22 1953 06/30/22 0413 07/01/22 0514 07/02/22 0401  NA 137 138 141 139  K 5.1 4.3 3.3* 3.3*  CL 105 105 102 100  CO2 23 27 29 28   GLUCOSE 284* 227* 147* 163*  BUN 46* 47* 45* 49*  CREATININE 2.16* 2.23* 1.99* 2.10*  CALCIUM 8.2* 8.2* 8.2* 8.3*   GFR Estimated Creatinine Clearance: 15.5 mL/min (A) (by C-G formula based on SCr of 2.1 mg/dL (H)). Liver Function Tests: Recent Labs  Lab 06/29/22 1953  AST 26  ALT 25  ALKPHOS 72  BILITOT 0.7  PROT 6.4*  ALBUMIN 3.7   No results for input(s): "LIPASE", "AMYLASE" in the last 168 hours. No results for input(s): "AMMONIA" in the last 168 hours. Coagulation profile No results for input(s): "INR", "PROTIME" in the last 168 hours.  CBC: Recent Labs  Lab 06/29/22 1953 06/30/22 0413 07/01/22 0514 07/02/22 0401  WBC 4.7 3.1* 3.6* 3.3*  NEUTROABS 3.6  --   --   --   HGB 8.5* 7.8* 8.6* 9.0*  HCT 27.3* 25.2* 26.7* 27.7*  MCV 107.5* 106.3* 104.3*  104.9*  PLT 145* 133* 148* 135*   Cardiac Enzymes: No results for input(s): "CKTOTAL", "CKMB", "CKMBINDEX", "TROPONINI" in the last 168 hours. BNP: Invalid input(s): "POCBNP" CBG: Recent Labs  Lab 07/01/22 0747 07/01/22 1136 07/01/22 1647 07/01/22 2129 07/02/22 0726  GLUCAP 169* 115* 306* 140* 168*   D-Dimer No results for input(s): "DDIMER" in the last 72 hours. Hgb A1c Recent Labs    06/30/22 0413  HGBA1C 8.2*   Lipid Profile No results for input(s): "CHOL", "HDL", "LDLCALC", "TRIG", "CHOLHDL", "LDLDIRECT" in the last 72 hours. Thyroid function studies No results for input(s): "TSH", "T4TOTAL", "T3FREE", "THYROIDAB" in the last 72 hours.  Invalid input(s): "FREET3" Anemia work up No results for input(s): "VITAMINB12", "FOLATE", "FERRITIN", "TIBC", "IRON", "RETICCTPCT" in the last 72 hours. Microbiology Recent Results  (from the past 240 hour(s))  Urine Culture     Status: Abnormal (Preliminary result)   Collection Time: 06/30/22  9:50 AM   Specimen: Urine, Random  Result Value Ref Range Status   Specimen Description   Final    URINE, RANDOM Performed at [facility], [address] [place], [place] [address]    Special Requests   Final    NONE Reflexed from [id number] Performed at [facility], [address], [place], [place] [address]    Culture (A)  Final    >=100,000 COLONIES/mL ESCHERICHIA COLI SUSCEPTIBILITIES TO FOLLOW Performed at [facility], [address], [place], [place] [address]    Report Status PENDING  Incomplete     Discharge Instructions:   Discharge Instructions     (HEART FAILURE PATIENTS) Call MD:  Anytime you have any of the following symptoms: 1) 3 pound weight gain in 24 hours or 5 pounds in 1 week 2) shortness of breath, with or without a dry hacking cough 3) swelling in the hands, feet or stomach 4) if you have to sleep on extra pillows at night in order to breathe.   Complete by: As directed    Diet - low sodium heart healthy   Complete by: As directed    Diet Carb Modified   Complete by: As directed    Discharge instructions   Complete by: As directed    Weekly BMPs x 3-4 weeks until kidney function stable Daily weights Close follow up with PCP and nephrology -holding your cozaar until your kidney function stable   Heart Failure patients record your daily weight using the same scale at the same time of day   Complete by: As directed    Increase activity slowly   Complete by: As directed       Allergies as of 07/02/2022       Reactions   Penicillins Itching, Swelling   Swelling and flushing Has patient had a PCN reaction causing immediate rash, facial/tongue/throat swelling, SOB or lightheadedness with hypotension: yes Has patient had a PCN reaction causing severe rash involving mucus membranes or skin necrosis:  no Has patient had a PCN reaction that required hospitalization: no Has patient had a PCN reaction occurring within the last 10 years: no If all of the above answers are "NO", then may proceed with Cephalosporin use.   Banana Nausea Only   Green bananas    Detrol [tolterodine]    Leg cramps   Quetiapine Other (See Comments)   Leg cramps/restless leg   Risperidone And Related    Unknown reaction   Lisinopril Cough        Medication List  STOP taking these medications    furosemide 40 MG tablet Commonly known as: LASIX   losartan 50 MG tablet Commonly known as: COZAAR       TAKE these medications    amLODipine 5 MG tablet Commonly known as: NORVASC Take 5 mg by mouth at bedtime.   carvedilol 12.5 MG tablet Commonly known as: COREG Take 25 mg by mouth 2 (two) times daily.   cefadroxil 500 MG capsule Commonly known as: DURICEF Take 1 capsule (500 mg total) by mouth daily. Start taking on: July 03, 2022   Creon 3000-9500 units Cpep Generic drug: Pancrelipase (Lip-Prot-Amyl) Take 3 capsules by mouth in the morning, at noon, and at bedtime.   ferrous sulfate 324 MG Tbec Take 324 mg by mouth 2 (two) times daily. Takes   freestyle lancets by Does not apply route.   glipiZIDE 2.5 MG 24 hr tablet Commonly known as: GLUCOTROL XL Take 5 mg by mouth 2 (two) times daily.   ICAPS AREDS 2 PO Take 1 tablet by mouth 2 (two) times daily.   levothyroxine 175 MCG tablet Commonly known as: SYNTHROID Take 175 mcg by mouth daily before breakfast. 6 days a week, does not take on sundays   Melatonin 5 MG Caps Take 5 mg by mouth See admin instructions. Take 5 mg 2 hours before bedtime   omeprazole 20 MG capsule Commonly known as: PRILOSEC Take 20 mg by mouth daily.   potassium chloride 10 MEQ CR capsule Commonly known as: MICRO-K Take 20 mEq by mouth 2 (two) times daily. 20mg  in the am 10mg  in the afternoon and 10mg  at night   pramipexole 0.25 MG tablet Commonly  known as: MIRAPEX Take 0.25-0.5 mg by mouth See admin instructions. Take 0.5 mg 2 hours prior to bedtime then 0.25 mg at bedtime   predniSONE 10 MG tablet Commonly known as: DELTASONE Take 5 mg by mouth daily with breakfast.   raloxifene 60 MG tablet Commonly known as: EVISTA Take 60 mg by mouth daily.   rosuvastatin 5 MG tablet Commonly known as: CRESTOR Take 1 tablet (5 mg total) by mouth 2 (two) times a week. Take on Mondays and Thursdays.   sulfamethoxazole-trimethoprim 400-80 MG tablet Commonly known as: BACTRIM Take 1 tablet by mouth 3 (three) times a week. Monday Wednesday friday   SYSTANE OP Place 1 drop into both eyes every evening.   tacrolimus 0.5 MG capsule Commonly known as: PROGRAF Take 2 mg by mouth every 12 (twelve) hours.   temazepam 15 MG capsule Commonly known as: RESTORIL Take 15-30 mg by mouth at bedtime.   torsemide 20 MG tablet Commonly known as: DEMADEX Take 3 tablets (60 mg total) by mouth daily. What changed:  how much to take when to take this   Toujeo SoloStar 300 UNIT/ML Solostar Pen Generic drug: insulin glargine (1 Unit Dial) Inject 5-7 Units into the skin daily.   vitamin B-12 500 MCG tablet Commonly known as: CYANOCOBALAMIN Take 500 mcg by mouth daily.   Vitamin D3 125 MCG (5000 UT) Tabs Generic drug: Cholecalciferol Take 5,000 Units by mouth 3 (three) times daily after meals.          Time coordinating discharge: 45 min  Signed:  Joseph Art DO  [facility] 07/02/2022, 8:30 AM

## 2022-07-03 ENCOUNTER — Ambulatory Visit (HOSPITAL_COMMUNITY): Payer: Medicare PPO

## 2022-07-03 ENCOUNTER — Telehealth: Payer: Self-pay | Admitting: Cardiology

## 2022-07-03 DIAGNOSIS — R609 Edema, unspecified: Secondary | ICD-10-CM | POA: Diagnosis not present

## 2022-07-03 DIAGNOSIS — I509 Heart failure, unspecified: Secondary | ICD-10-CM | POA: Diagnosis not present

## 2022-07-03 DIAGNOSIS — Z09 Encounter for follow-up examination after completed treatment for conditions other than malignant neoplasm: Secondary | ICD-10-CM | POA: Diagnosis not present

## 2022-07-03 DIAGNOSIS — M810 Age-related osteoporosis without current pathological fracture: Secondary | ICD-10-CM | POA: Diagnosis not present

## 2022-07-03 DIAGNOSIS — Z6824 Body mass index (BMI) 24.0-24.9, adult: Secondary | ICD-10-CM | POA: Diagnosis not present

## 2022-07-03 NOTE — Telephone Encounter (Signed)
Pt was recently hospitalized (4/4-4/7) and had an echo while there. She is inquiring to see if the echo that is scheduled for 4/15 can be canceled since she has already had one. Please advise.

## 2022-07-03 NOTE — Telephone Encounter (Signed)
Left the pt a message to call the office back. 

## 2022-07-04 ENCOUNTER — Encounter: Payer: Self-pay | Admitting: Cardiology

## 2022-07-04 NOTE — Telephone Encounter (Signed)
error 

## 2022-07-04 NOTE — Telephone Encounter (Signed)
Patient returning call.

## 2022-07-04 NOTE — Telephone Encounter (Signed)
OK to cancel - pt is aware.

## 2022-07-10 ENCOUNTER — Other Ambulatory Visit (HOSPITAL_COMMUNITY): Payer: Medicare PPO

## 2022-07-10 ENCOUNTER — Other Ambulatory Visit (HOSPITAL_BASED_OUTPATIENT_CLINIC_OR_DEPARTMENT_OTHER): Payer: Medicare PPO

## 2022-07-10 DIAGNOSIS — I509 Heart failure, unspecified: Secondary | ICD-10-CM | POA: Diagnosis not present

## 2022-07-11 DIAGNOSIS — Z940 Kidney transplant status: Secondary | ICD-10-CM | POA: Diagnosis not present

## 2022-07-12 ENCOUNTER — Inpatient Hospital Stay: Payer: Medicare PPO | Attending: Hematology and Oncology | Admitting: Hematology and Oncology

## 2022-07-12 VITALS — BP 124/41 | HR 63 | Temp 97.7°F | Resp 14 | Ht 59.0 in | Wt 122.6 lb

## 2022-07-12 DIAGNOSIS — R5383 Other fatigue: Secondary | ICD-10-CM | POA: Insufficient documentation

## 2022-07-12 DIAGNOSIS — D649 Anemia, unspecified: Secondary | ICD-10-CM | POA: Diagnosis not present

## 2022-07-12 DIAGNOSIS — Z83719 Family history of colon polyps, unspecified: Secondary | ICD-10-CM | POA: Insufficient documentation

## 2022-07-12 DIAGNOSIS — Z8719 Personal history of other diseases of the digestive system: Secondary | ICD-10-CM | POA: Insufficient documentation

## 2022-07-12 DIAGNOSIS — Z79899 Other long term (current) drug therapy: Secondary | ICD-10-CM | POA: Insufficient documentation

## 2022-07-12 DIAGNOSIS — N184 Chronic kidney disease, stage 4 (severe): Secondary | ICD-10-CM | POA: Diagnosis not present

## 2022-07-12 DIAGNOSIS — Z90710 Acquired absence of both cervix and uterus: Secondary | ICD-10-CM | POA: Diagnosis not present

## 2022-07-12 DIAGNOSIS — Z808 Family history of malignant neoplasm of other organs or systems: Secondary | ICD-10-CM | POA: Insufficient documentation

## 2022-07-12 DIAGNOSIS — Z87891 Personal history of nicotine dependence: Secondary | ICD-10-CM | POA: Diagnosis not present

## 2022-07-12 DIAGNOSIS — Z818 Family history of other mental and behavioral disorders: Secondary | ICD-10-CM | POA: Diagnosis not present

## 2022-07-12 DIAGNOSIS — I130 Hypertensive heart and chronic kidney disease with heart failure and stage 1 through stage 4 chronic kidney disease, or unspecified chronic kidney disease: Secondary | ICD-10-CM | POA: Diagnosis not present

## 2022-07-12 DIAGNOSIS — Z853 Personal history of malignant neoplasm of breast: Secondary | ICD-10-CM | POA: Diagnosis not present

## 2022-07-12 DIAGNOSIS — I509 Heart failure, unspecified: Secondary | ICD-10-CM | POA: Insufficient documentation

## 2022-07-12 DIAGNOSIS — Z880 Allergy status to penicillin: Secondary | ICD-10-CM | POA: Insufficient documentation

## 2022-07-12 DIAGNOSIS — Z888 Allergy status to other drugs, medicaments and biological substances status: Secondary | ICD-10-CM | POA: Insufficient documentation

## 2022-07-12 DIAGNOSIS — Z833 Family history of diabetes mellitus: Secondary | ICD-10-CM | POA: Insufficient documentation

## 2022-07-12 DIAGNOSIS — Z9049 Acquired absence of other specified parts of digestive tract: Secondary | ICD-10-CM | POA: Diagnosis not present

## 2022-07-12 DIAGNOSIS — Z8521 Personal history of malignant neoplasm of larynx: Secondary | ICD-10-CM | POA: Diagnosis not present

## 2022-07-12 DIAGNOSIS — Z87442 Personal history of urinary calculi: Secondary | ICD-10-CM | POA: Diagnosis not present

## 2022-07-12 DIAGNOSIS — M7989 Other specified soft tissue disorders: Secondary | ICD-10-CM | POA: Diagnosis not present

## 2022-07-12 DIAGNOSIS — Z8249 Family history of ischemic heart disease and other diseases of the circulatory system: Secondary | ICD-10-CM | POA: Diagnosis not present

## 2022-07-12 DIAGNOSIS — E1122 Type 2 diabetes mellitus with diabetic chronic kidney disease: Secondary | ICD-10-CM | POA: Insufficient documentation

## 2022-07-12 MED ORDER — CIPROFLOXACIN HCL 500 MG PO TABS: 500.0000 mg | ORAL_TABLET | Freq: Every day | ORAL | 0 refills | Status: DC

## 2022-07-12 NOTE — Progress Notes (Signed)
[facility] CONSULT NOTE  Patient Care Team: [name], MD as PCP - General (Family Medicine) [name], MD as PCP - Cardiology (Cardiology) [name], MD as Attending Physician (Family Medicine) Lonie Peak, MD as Consulting Physician (Radiation Oncology) Malmfelt, Lise Auer, RN as Oncology Nurse Navigator [name], MD as Consulting Physician (Cardiology)  CHIEF COMPLAINTS/PURPOSE OF CONSULTATION:  Normocytic normochromic anemia   ASSESSMENT & PLAN:   This is a very pleasant 82 year old female patient with past medical history significant for diabetes, hypertension, breast cancer, malignant neoplasm of supraglottis, 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 They were ok with epo if there is contraindication such as solid tumors/active disease. Since her last visit here, she was hospitalized with a CHF exacerbation and has a follow-up with her cardiologist.  Her most recent labs during the hospitalization showed a hemoglobin of 9 g/dL.  Once again we did we discussed about proceeding with imaging as scheduled in May and if there is no concern for any progression from the supraglottis or any new lung nodules, she can then consider erythropoietin versus monitoring.  She also had some urine testing at [facility] yesterday and wondered about these results, this showed increased white blood cell count and leukocyte esterase positivity and she had some symptoms while she was hospitalized and she was requesting some antibiotics.  Given her creatinine clearance we have sent ciprofloxacin 500 mg daily.  At this time given her most reasonable explanation for her anemia being chronic kidney disease we have deferred bone marrow aspiration and biopsy. All her questions were answered to the best  my knowledge.  She can return to clinic as scheduled in third week of May.  HISTORY OF PRESENTING ILLNESS:   Anita Pollard 82 y.o. female is here because of anemia.  This is a very pleasant 82 year old female patient with past medical history significant for right-sided breast cancer, cancer of the supraglottis status postradiation, type 2 diabetes, hypertension, kidney transplant referred to hematology for evaluation of slowly progressive anemia. She is here for follow-up.  Since her last visit here she had CHF exacerbation, had intense diuresis.  She feels better except for tiredness.  Her lower extremity swelling has significantly improved.  She otherwise denies any new health complaints although she may have had some urinary symptoms while she was hospitalized and was curious to know her urinary analysis results.  MEDICAL HISTORY:  Past Medical History:  Diagnosis Date   Alcohol abuse    Anemia    Arthritis    Breast cancer 2010   Right   Chronic kidney disease    Colon polyp    Depression    Diabetes mellitus    GERD (gastroesophageal reflux disease)    Heart murmur    History of kidney stones 1976   HTN (hypertension)    Hypothyroidism    Osteopenia    Pancreatitis    Ulcerative esophagitis     SURGICAL HISTORY: Past Surgical History:  Procedure Laterality Date   ABDOMINAL HYSTERECTOMY     ABDOMINOPLASTY     APPENDECTOMY     BREAST BIOPSY Right 08/03/2008   Stereo Bx, Malignant   BREAST LUMPECTOMY Right 08/26/2008   CATARACT EXTRACTION W/ INTRAOCULAR LENS  IMPLANT, BILATERAL     CO2 LASER APPLICATION N/A 01/09/2020  Procedure: CO2 LASER APPLICATION;  Surgeon: Christia Reading, MD;  Location: [facility] OR;  Service: ENT;  Laterality: N/A;   COSMETIC SURGERY     ductal carcinoma     right breast    EYE SURGERY     HIP ARTHROPLASTY Right    INSERTION OF DIALYSIS CATHETER Right 09/25/2012   Procedure: INSERTION OF DIALYSIS CATHETER;  Surgeon: Pryor Ochoa, MD;  Location:  [facility] OR;  Service: Vascular;  Laterality: Right;  Righ Internal Jugular Placement   IR GASTROSTOMY TUBE MOD SED  02/10/2020   IR GASTROSTOMY TUBE REMOVAL  05/25/2020   JOINT REPLACEMENT     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   MICROLARYNGOSCOPY N/A 10/20/2019   Procedure: Suspended Microlaryngoscopy with Biopsy;  Surgeon: Christia Reading, MD;  Location: [facility] OR;  Service: ENT;  Laterality: N/A;   MICROLARYNGOSCOPY N/A 01/09/2020   Procedure: MICROLARYNGOSCOPY WITH BIOPSY;  Surgeon: Christia Reading, MD;  Location: [facility] OR;  Service: ENT;  Laterality: N/A;   RIGID ESOPHAGOSCOPY N/A 10/20/2019   Procedure: RIGID ESOPHAGOSCOPY;  Surgeon: Christia Reading, MD;  Location: [facility] OR;  Service: ENT;  Laterality: N/A;   THYROIDECTOMY     TUMMY TUCK      SOCIAL HISTORY: Social History   Socioeconomic History   Marital status: Widowed    Spouse name: Not on file   Number of children: Not on file   Years of education: Not on file   Highest education level: Not on file  Occupational History   Not on file  Tobacco Use   Smoking status: Former    Types: Cigarettes    Quit date: 09/25/1983    Years since quitting: 38.8   Smokeless tobacco: Never  Vaping Use   Vaping Use: Never used  Substance and Sexual Activity   Alcohol use: Yes    Comment: socially history of heavy drinking with intervention x 2-at least 2 occasions since april '14   Drug use: No   Sexual activity: Never  Other Topics Concern   Not on file  Social History Narrative   Not on file   Social Determinants of Health   Financial Resource Strain: Low Risk  (01/22/2020)   Overall Financial Resource Strain (CARDIA)    Difficulty of Paying Living Expenses: Not hard at all  Food Insecurity: No Food Insecurity (06/30/2022)   Hunger Vital Sign    Worried About Running Out of Food in the Last Year: Never true    Ran Out of Food in the Last Year: Never true  Transportation Needs: No Transportation Needs (06/30/2022)   PRAPARE - Therapist, art (Medical): No    Lack of Transportation (Non-Medical): No  Physical Activity: Insufficiently Active (01/22/2020)   Exercise Vital Sign    Days of Exercise per Week: 5 days    Minutes of Exercise per Session: 20 min  Stress: Not on file  Social Connections: Moderately Isolated (01/22/2020)   Social Connection and Isolation Panel [NHANES]    Frequency of Communication with Friends and Family: More than three times a week    Frequency of Social Gatherings with Friends and Family: Never    Attends Religious Services: Never    Database administrator or Organizations: Yes    Attends Banker Meetings: Never    Marital Status: Widowed  Intimate Partner Violence: Not At Risk (06/30/2022)   Humiliation, Afraid, Rape, and Kick questionnaire    Fear of Current or Ex-Partner: No    Emotionally Abused:  No    Physically Abused: No    Sexually Abused: No    FAMILY HISTORY: Family History  Problem Relation Age of Onset   Hypertension Mother    Diabetes Father    Hypertension Father    Hypertension Sister    Dementia Sister    Multiple myeloma Sister    Thyroid cancer Daughter 78   Diabetes Brother    Colon polyps Brother    Atrial fibrillation Brother    Breast cancer Neg Hx     ALLERGIES:  is allergic to penicillins, banana, detrol [tolterodine], quetiapine, risperidone and related, and lisinopril.  MEDICATIONS:  Current Outpatient Medications  Medication Sig Dispense Refill   amLODipine (NORVASC) 5 MG tablet Take 5 mg by mouth at bedtime.     carvedilol (COREG) 12.5 MG tablet Take 25 mg by mouth 2 (two) times daily.     cefadroxil (DURICEF) 500 MG capsule Take 1 capsule (500 mg total) by mouth daily. 2 capsule 0   Cholecalciferol (VITAMIN D3) 5000 UNITS TABS Take 5,000 Units by mouth 3 (three) times daily after meals.      CREON 3000-9500 units CPEP Take 3 capsules by mouth in the morning, at noon, and at bedtime.     ferrous sulfate 324 MG TBEC  Take 324 mg by mouth 2 (two) times daily. Takes     glipiZIDE (GLUCOTROL XL) 2.5 MG 24 hr tablet Take 5 mg by mouth 2 (two) times daily.      insulin glargine, 1 Unit Dial, (TOUJEO SOLOSTAR) 300 UNIT/ML Solostar Pen Inject 5-7 Units into the skin daily.     Lancets (FREESTYLE) lancets by Does not apply route.     levothyroxine (SYNTHROID) 175 MCG tablet Take 175 mcg by mouth daily before breakfast. 6 days a week, does not take on sundays     Melatonin 5 MG CAPS Take 5 mg by mouth See admin instructions. Take 5 mg 2 hours before bedtime     Multiple Vitamins-Minerals (ICAPS AREDS 2 PO) Take 1 tablet by mouth 2 (two) times daily.     omeprazole (PRILOSEC) 20 MG capsule Take 20 mg by mouth daily.     Polyethyl Glycol-Propyl Glycol (SYSTANE OP) Place 1 drop into both eyes every evening.     potassium chloride (MICRO-K) 10 MEQ CR capsule Take 20 mEq by mouth 2 (two) times daily.  in the am  in the afternoon and  at night     pramipexole (MIRAPEX) 0.25 MG tablet Take 0.25-0.5 mg by mouth See admin instructions. Take 0.5 mg 2 hours prior to bedtime then 0.25 mg at bedtime     predniSONE (DELTASONE) 10 MG tablet Take 5 mg by mouth daily with breakfast.     raloxifene (EVISTA) 60 MG tablet Take 60 mg by mouth daily.     rosuvastatin (CRESTOR) 5 MG tablet Take 1 tablet (5 mg total) by mouth 2 (two) times a week. Take on Mondays and Thursdays. 30 tablet 3   sulfamethoxazole-trimethoprim (BACTRIM) 400-80 MG tablet Take 1 tablet by mouth 3 (three) times a week. Monday Wednesday friday     tacrolimus (PROGRAF) 0.5 MG capsule Take 2 mg by mouth every 12 (twelve) hours.     temazepam (RESTORIL) 15 MG capsule Take 15-30 mg by mouth at bedtime.      torsemide (DEMADEX) 20 MG tablet Take 3 tablets (60 mg total) by mouth daily. 90 tablet 0   vitamin B-12 (CYANOCOBALAMIN) 500 MCG tablet Take 500 mcg by mouth daily.  No current facility-administered medications for this visit.   PHYSICAL  EXAMINATION:  ECOG PERFORMANCE STATUS: 0 - Asymptomatic  Vitals:   07/12/22 1443  BP: (!) 124/41  Pulse: 63  Resp: 14  Temp: 97.7 F (36.5 C)  SpO2: 95%    Physical exam deferred today   LABORATORY DATA:  I have reviewed the data as listed Lab Results  Component Value Date   WBC 3.3 (L) 07/02/2022   HGB 9.0 (L) 07/02/2022   HCT 27.7 (L) 07/02/2022   MCV 104.9 (H) 07/02/2022   PLT 135 (L) 07/02/2022     Chemistry      Component Value Date/Time   NA 139 07/02/2022 0401   NA 137 03/22/2020 1439   NA 140 08/28/2013 0955   K 3.3 (L) 07/02/2022 0401   K 3.0 (LL) 08/28/2013 0955   CL 100 07/02/2022 0401   CL 111 (H) 09/03/2012 1249   CO2 28 07/02/2022 0401   CO2 16 (L) 08/28/2013 0955   BUN 49 (H) 07/02/2022 0401   BUN 42 (H) 03/22/2020 1439   BUN 75.7 (H) 08/28/2013 0955   CREATININE 2.10 (H) 07/02/2022 0401   CREATININE 1.67 (H) 09/28/2020 1432   CREATININE 5.9 (HH) 08/28/2013 0955      Component Value Date/Time   CALCIUM 8.3 (L) 07/02/2022 0401   CALCIUM 7.5 (L) 08/28/2013 0955   ALKPHOS 72 06/29/2022 1953   ALKPHOS 62 08/28/2013 0955   AST 26 06/29/2022 1953   AST 15 09/28/2020 1432   AST 15 08/28/2013 0955   ALT 25 06/29/2022 1953   ALT 15 09/28/2020 1432   ALT 17 08/28/2013 0955   BILITOT 0.7 06/29/2022 1953   BILITOT 0.3 09/28/2020 1432   BILITOT 0.38 08/28/2013 0955     Reviewed labs from [facility] done on December 14.  Her white blood cell count of 5.7, hemoglobin is 10.3, platelet count of 210,000, absolute lymphocytes were low and absolute eosinophils were low, otherwise iron panel showed a ferritin of 46 and total iron binding capacity of 326, iron saturation of 25%.   07/21/2021, CBC showed white blood cell count of 6000, hemoglobin of 10, hematocrit of 30.2 and platelet count of 187,000. Iron and iron binding panel normal.  B12 is normal.  01/19/2022 CBC showed hemoglobin of 9.2 g/dL, total white blood cell count of 5.6 and platelet count of  183,000.  Iron panel with increasing total iron binding capacity suggestive of iron deficiency, ferritin pending  I spoke with Dr Gardiner Ramus and he suggested she has CKD 4, ferritin 33. I couldn't see the labs in care everywhere.  RADIOGRAPHIC STUDIES: I have personally reviewed the radiological images as listed and agreed with the findings in the report. ECHOCARDIOGRAM COMPLETE  Result Date: 07/01/2022    ECHOCARDIOGRAM REPORT   Patient Name:   Anita Pollard Date of Exam: 07/01/2022 Medical Rec #:  [id number]       Height:       59.0 in Accession #:    [id number]      Weight:       129.6 lb Date of Birth:  Dec 03, 1940       BSA:          1.534 m Patient Age:    81 years        BP:           147/59 mmHg Patient Gender: F               HR:  64 bpm. Exam Location:  Inpatient Procedure: 2D Echo, Cardiac Doppler and Color Doppler Indications:    CHF I50.21  History:        Patient has prior history of Echocardiogram examinations, most                 recent 03/18/2020. CHF, Carotid Disease, Signs/Symptoms:Dyspnea                 and Edema; Risk Factors:Hypertension, Diabetes, Dyslipidemia and                 Former Smoker.  Sonographer:    Dondra Prader RVT Referring Phys: [id number] JESSICA U VANN IMPRESSIONS  1. Left ventricular ejection fraction, by estimation, is 70 to 75%. The left ventricle has hyperdynamic function. The left ventricle has no regional wall motion abnormalities. Left ventricular diastolic parameters are consistent with Grade I diastolic dysfunction (impaired relaxation). Elevated left atrial pressure.  2. Right ventricular systolic function is normal. The right ventricular size is normal.  3. The mitral valve is normal in structure. Trivial mitral valve regurgitation. No evidence of mitral stenosis.  4. The aortic valve is tricuspid. Aortic valve regurgitation is not visualized. Aortic valve sclerosis is present, with no evidence of aortic valve stenosis.  5. The inferior vena cava is normal in size  with greater than 50% respiratory variability, suggesting right atrial pressure of 3 mmHg. FINDINGS  Left Ventricle: Left ventricular ejection fraction, by estimation, is 70 to 75%. The left ventricle has hyperdynamic function. The left ventricle has no regional wall motion abnormalities. The left ventricular internal cavity size was normal in size. There is no left ventricular hypertrophy. Left ventricular diastolic parameters are consistent with Grade I diastolic dysfunction (impaired relaxation). Elevated left atrial pressure. Right Ventricle: The right ventricular size is normal. Right ventricular systolic function is normal. Left Atrium: Left atrial size was normal in size. Right Atrium: Right atrial size was normal in size. Pericardium: There is no evidence of pericardial effusion. Mitral Valve: The mitral valve is normal in structure. Mild mitral annular calcification. Trivial mitral valve regurgitation. No evidence of mitral valve stenosis. Tricuspid Valve: The tricuspid valve is normal in structure. Tricuspid valve regurgitation is trivial. No evidence of tricuspid stenosis. Aortic Valve: The aortic valve is tricuspid. Aortic valve regurgitation is not visualized. Aortic valve sclerosis is present, with no evidence of aortic valve stenosis. Aortic valve mean gradient measures 7.0 mmHg. Aortic valve peak gradient measures 12.5 mmHg. Aortic valve area, by VTI measures 2.05 cm. Pulmonic Valve: The pulmonic valve was normal in structure. Pulmonic valve regurgitation is not visualized. No evidence of pulmonic stenosis. Aorta: The aortic root is normal in size and structure. Venous: The inferior vena cava is normal in size with greater than 50% respiratory variability, suggesting right atrial pressure of 3 mmHg. IAS/Shunts: No atrial level shunt detected by color flow Doppler.  LEFT VENTRICLE PLAX 2D LVIDd:         3.70 cm   Diastology LVIDs:         2.40 cm   LV e' medial:    5.68 cm/s LV PW:         1.00 cm    LV E/e' medial:  20.1 LV IVS:        0.90 cm   LV e' lateral:   7.14 cm/s LVOT diam:     1.90 cm   LV E/e' lateral: 16.0 LV SV:         85 LV SV  Index:   55 LVOT Area:     2.84 cm  RIGHT VENTRICLE             IVC RV Basal diam:  4.10 cm     IVC diam: 2.50 cm RV S prime:     13.70 cm/s TAPSE (M-mode): 3.0 cm LEFT ATRIUM             Index        RIGHT ATRIUM           Index LA diam:        3.70 cm 2.41 cm/m   RA Area:     16.00 cm LA Vol (A2C):   33.3 ml 21.71 ml/m  RA Volume:   42.20 ml  27.51 ml/m LA Vol (A4C):   43.9 ml 28.62 ml/m LA Biplane Vol: 40.9 ml 26.66 ml/m  AORTIC VALVE                     PULMONIC VALVE AV Area (Vmax):    2.13 cm      PV Vmax:       1.19 m/s AV Area (Vmean):   2.07 cm      PV Peak grad:  5.7 mmHg AV Area (VTI):     2.05 cm AV Vmax:           177.00 cm/s AV Vmean:          123.000 cm/s AV VTI:            0.414 m AV Peak Grad:      12.5 mmHg AV Mean Grad:      7.0 mmHg LVOT Vmax:         133.00 cm/s LVOT Vmean:        90.000 cm/s LVOT VTI:          0.300 m LVOT/AV VTI ratio: 0.72  AORTA Ao Root diam: 3.00 cm Ao Arch diam: 3.1 cm MITRAL VALVE                TRICUSPID VALVE MV Area (PHT): 3.05 cm     TR Peak grad:   41.7 mmHg MV Decel Time: 249 msec     TR Vmax:        323.00 cm/s MV E velocity: 114.00 cm/s MV A velocity: 125.00 cm/s  SHUNTS MV E/A ratio:  0.91         Systemic VTI:  0.30 m                             Systemic Diam: 1.90 cm Olga Millers MD Electronically signed by Olga Millers MD Signature Date/Time: 07/01/2022/5:58:30 PM    Final    VAS [place] LOWER EXTREMITY VENOUS (DVT)  Result Date: 06/30/2022  Lower Venous DVT Study Patient Name:  Anita Pollard  Date of Exam:   06/30/2022 Medical Rec #: [id number]        Accession #:    [id number] Date of Birth: 19-Jun-1940        Patient Gender: F Patient Age:   45 years Exam Location:  [facility] Procedure:      VAS [place] LOWER EXTREMITY VENOUS (DVT) Referring Phys: MIR [facility]  --------------------------------------------------------------------------------  Indications: Edema.  Risk Factors: None identified. Limitations: Poor ultrasound/tissue interface and patient pain tolerance. Comparison Study: No prior studies. Performing Technologist: Chanda Busing RVT  Examination Guidelines: A complete evaluation includes B-mode imaging, spectral Doppler,  color Doppler, and power Doppler as needed of all accessible portions of each vessel. Bilateral testing is considered an integral part of a complete examination. Limited examinations for reoccurring indications may be performed as noted. The reflux portion of the exam is performed with the patient in reverse Trendelenburg.  +---------+---------------+---------+-----------+----------+--------------+ RIGHT    CompressibilityPhasicitySpontaneityPropertiesThrombus Aging +---------+---------------+---------+-----------+----------+--------------+ CFV      Full           Yes      Yes                                 +---------+---------------+---------+-----------+----------+--------------+ SFJ      Full                                                        +---------+---------------+---------+-----------+----------+--------------+ FV Prox  Full                                                        +---------+---------------+---------+-----------+----------+--------------+ FV Mid   Full                                                        +---------+---------------+---------+-----------+----------+--------------+ FV Distal               Yes      Yes                                 +---------+---------------+---------+-----------+----------+--------------+ PFV      Full                                                        +---------+---------------+---------+-----------+----------+--------------+ POP      Full           Yes      Yes                                  +---------+---------------+---------+-----------+----------+--------------+ PTV      Full                                                        +---------+---------------+---------+-----------+----------+--------------+ PERO     Full                                                        +---------+---------------+---------+-----------+----------+--------------+   +----+---------------+---------+-----------+----------+--------------+  LEFTCompressibilityPhasicitySpontaneityPropertiesThrombus Aging +----+---------------+---------+-----------+----------+--------------+ CFV Full           Yes      Yes                                 +----+---------------+---------+-----------+----------+--------------+     Summary: RIGHT: - There is no evidence of deep vein thrombosis in the lower extremity. However, portions of this examination were limited- see technologist comments above.  - No cystic structure found in the popliteal fossa.  LEFT: - No evidence of common femoral vein obstruction.  *See table(s) above for measurements and observations. Electronically signed by Waverly Ferrari MD on 06/30/2022 at 4:04:34 PM.    Final    DG CHEST PORT 1 VIEW  Result Date: 06/29/2022 CLINICAL DATA:  Several day history of shortness of breath EXAM: PORTABLE CHEST 1 VIEW COMPARISON:  Chest radiograph dated 03/06/2022 FINDINGS: Normal lung volumes. No focal consolidations. Blunting of the right costophrenic angle. The heart size and mediastinal contours are within normal limits. Old healed right lateral rib fractures. IMPRESSION: Trace right pleural effusion.  No focal consolidations. Electronically Signed   By: Agustin Cree M.D.   On: 06/29/2022 20:20    Total time spent: 30 minutes including history, review of recent records, labs, counseling and coordination of care     Rachel Moulds, MD 07/12/2022 3:09 PM

## 2022-07-17 ENCOUNTER — Ambulatory Visit: Payer: Medicare PPO | Attending: Cardiology | Admitting: Cardiology

## 2022-07-17 ENCOUNTER — Encounter: Payer: Self-pay | Admitting: Cardiology

## 2022-07-17 VITALS — BP 142/59 | HR 66 | Ht 59.0 in | Wt 126.0 lb

## 2022-07-17 DIAGNOSIS — R600 Localized edema: Secondary | ICD-10-CM

## 2022-07-17 DIAGNOSIS — I5033 Acute on chronic diastolic (congestive) heart failure: Secondary | ICD-10-CM
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
## 2022-07-17 NOTE — Patient Instructions (Signed)
Medication Instructions:  Your physician recommends that you continue on your current medications as directed. Please refer to the Current Medication list given to you today.  *If you need a refill on your cardiac medications before your next appointment, please call your pharmacy*   Lab Work: TODAY: BMET  If you have labs (blood work) drawn today and your tests are completely normal, you will receive your results only by: MyChart Message (if you have MyChart) OR A paper copy in the mail If you have any lab test that is abnormal or we need to change your treatment, we will call you to review the results.  Follow-Up: At [facility], you and your health needs are our priority.  As part of our continuing mission to provide you with exceptional heart care, we have created designated Provider Care Teams.  These Care Teams include your primary Cardiologist (physician) and Advanced Practice Providers (APPs -  Physician Assistants and Nurse Practitioners) who all work together to provide you with the care you need, when you need it.  Your next appointment:   3 month(s)  Provider:   Jari Favre, PA-C, Ronie Spies, PA-C, Robin Searing, NP, Jacolyn Reedy, PA-C, Eligha Bridegroom, NP, or Tereso Newcomer, PA-C

## 2022-07-18 LAB — BASIC METABOLIC PANEL
BUN/Creatinine Ratio: 27 (ref 12–28)
BUN: 57 mg/dL — ABNORMAL HIGH (ref 8–27)
CO2: 20 mmol/L (ref 20–29)
Calcium: 8.6 mg/dL — ABNORMAL LOW (ref 8.7–10.3)
Chloride: 106 mmol/L (ref 96–106)
Creatinine, Ser: 2.08 mg/dL — ABNORMAL HIGH (ref 0.57–1.00)
Glucose: 303 mg/dL — ABNORMAL HIGH (ref 70–99)
Potassium: 4.2 mmol/L (ref 3.5–5.2)
Sodium: 141 mmol/L (ref 134–144)
eGFR: 23 mL/min/{1.73_m2} — ABNORMAL LOW (ref >59)

## 2022-07-18 NOTE — Progress Notes (Signed)
Anita Pollard presents today for follow-up after completing radiation to her supraglottis on 03/31/2020 and left lower lung on 01/14/2021. Pt will receive CT results from 07-28-22 with this visit.   Pain issues, if any: Denies Using a feeding tube?: N/A Respiratory symptoms: Denies any new issues or concerns. Constant drainage Weight changes, if any:  Wt Readings from Last 3 Encounters:  08/01/22 123 lb 3.2 oz (55.9 kg)  07/17/22 126 lb (57.2 kg)  07/12/22 122 lb 9.6 oz (55.6 kg)   Swallowing issues, if any: Denies--reports her only concern is the frequent nasal drainage that sticks to her upper throat (but denies that it impairs her ability to swallow) Smoking or chewing tobacco? None Using fluoride toothpaste? Yes--reports she has had 4 crowns placed since her last visit Last ENT visit was on: Dr. Jenne Pane on 05-09-22 with clean scope --Procedure: Transnasal fiberoptic laryngoscopy The fiberoptic laryngoscope was then placed through the nasal passage to view the pharynx and larynx. After completion, the telescope was removed. Findings included normal nasal passages, no mass or abnormality in the nasopharynx, and no mass or ulceration in the pharynx or larynx. Pyriform sinuses are open. Secretions are minimal. Vocal folds are without mass, scarring, or ulceration. The vocal folds adduct and abduct symmetrically. There is good glottal closure. Muscle tension patterns are not present. Laryngeal edema is moderate and general. --Impression & Plans:  Anita Pollard is a 82 y.o. female with history of supraglottic carcinoma. - She is now over 2 years from treatment with no evidence of recurrence. She was encouraged. Her continued throat symptoms relate to treatment. She can follow-up with me in six months.  Other notable issues, if any: Was hospitalized last month for CHF. Last saw her medical oncologist Dr. Al Pimple on 07/12/2022. Reports right thigh pain that has changed from intermittent to constant (confirms  she is seeing PCP tomorrow to address)

## 2022-07-24 DIAGNOSIS — I509 Heart failure, unspecified: Secondary | ICD-10-CM | POA: Diagnosis not present

## 2022-07-28 ENCOUNTER — Ambulatory Visit
Admission: RE | Admit: 2022-07-28 | Discharge: 2022-07-28 | Disposition: A | Discharge status: Home / Self Care | Payer: Medicare PPO | Source: Ambulatory Visit | Attending: Radiation Oncology | Admitting: Radiation Oncology

## 2022-07-28 DIAGNOSIS — C3432 Malignant neoplasm of lower lobe, left bronchus or lung: Secondary | ICD-10-CM

## 2022-07-28 DIAGNOSIS — I2510 Atherosclerotic heart disease of native coronary artery without angina pectoris: Secondary | ICD-10-CM | POA: Diagnosis not present

## 2022-07-28 DIAGNOSIS — D020 Carcinoma in situ of larynx: Secondary | ICD-10-CM | POA: Diagnosis not present

## 2022-07-28 DIAGNOSIS — J439 Emphysema, unspecified: Secondary | ICD-10-CM | POA: Diagnosis not present

## 2022-07-28 DIAGNOSIS — C321 Malignant neoplasm of supraglottis: Secondary | ICD-10-CM

## 2022-08-01 ENCOUNTER — Other Ambulatory Visit: Payer: Self-pay

## 2022-08-01 ENCOUNTER — Ambulatory Visit
Admission: RE | Admit: 2022-08-01 | Discharge: 2022-08-01 | Disposition: A | Discharge status: Home / Self Care | Payer: Medicare PPO | Source: Ambulatory Visit | Attending: Radiation Oncology | Admitting: Radiation Oncology

## 2022-08-01 ENCOUNTER — Encounter: Payer: Self-pay | Admitting: Radiation Oncology

## 2022-08-01 VITALS — BP 158/52 | HR 65 | Temp 97.6°F | Resp 18 | Wt 123.2 lb

## 2022-08-01 DIAGNOSIS — I2510 Atherosclerotic heart disease of native coronary artery without angina pectoris: Secondary | ICD-10-CM | POA: Diagnosis not present

## 2022-08-01 DIAGNOSIS — Z79899 Other long term (current) drug therapy: Secondary | ICD-10-CM | POA: Insufficient documentation

## 2022-08-01 DIAGNOSIS — Z79890 Hormone replacement therapy (postmenopausal): Secondary | ICD-10-CM | POA: Diagnosis not present

## 2022-08-01 DIAGNOSIS — C3432 Malignant neoplasm of lower lobe, left bronchus or lung: Secondary | ICD-10-CM

## 2022-08-01 DIAGNOSIS — Z7952 Long term (current) use of systemic steroids: Secondary | ICD-10-CM | POA: Diagnosis not present

## 2022-08-01 DIAGNOSIS — I509 Heart failure, unspecified: Secondary | ICD-10-CM | POA: Diagnosis not present

## 2022-08-01 DIAGNOSIS — M79651 Pain in right thigh: Secondary | ICD-10-CM | POA: Insufficient documentation

## 2022-08-01 DIAGNOSIS — I700 Atherosclerosis of aorta: Secondary | ICD-10-CM | POA: Diagnosis not present

## 2022-08-01 DIAGNOSIS — Z85118 Personal history of other malignant neoplasm of bronchus and lung: Secondary | ICD-10-CM | POA: Diagnosis not present

## 2022-08-01 DIAGNOSIS — C321 Malignant neoplasm of supraglottis: Secondary | ICD-10-CM

## 2022-08-01 DIAGNOSIS — Z7984 Long term (current) use of oral hypoglycemic drugs: Secondary | ICD-10-CM | POA: Diagnosis not present

## 2022-08-01 DIAGNOSIS — Z85818 Personal history of malignant neoplasm of other sites of lip, oral cavity, and pharynx: Secondary | ICD-10-CM | POA: Diagnosis not present

## 2022-08-01 DIAGNOSIS — J432 Centrilobular emphysema: Secondary | ICD-10-CM | POA: Diagnosis not present

## 2022-08-01 NOTE — Progress Notes (Signed)
Radiation Oncology         [phone] ________________________________  Name: Anita Pollard MRN: [id number]  Date: 08/01/2022  DOB: [birth year]  Follow-Up Visit Note   OUTPATIENT, in person   CC: Anita Floro, MD  Christia Reading, MD  Diagnosis and Prior Radiotherapy:       ICD-10-CM   1. 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)  C34.32        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(HCC) Staging form: Larynx - Supraglottis, AJCC 8th Edition - Clinical stage from 01/20/2020: Stage II (cT2, cN0, cM0) - Signed by Lonie Peak, MD on 06/22/2020 Stage prefix: Initial diagnosis   Radiation Treatment Dates: 02/09/2020 through 03/31/2020 Site Technique Total Dose (Gy) Dose per Fx (Gy) Completed Fx Beam Energies  Larynx: HN_supragl IMRT 70/70 2 35/35 6X    Radiation Treatment Dates: 01/04/2021 through 01/14/2021 Site Technique Total Dose (Gy) Dose per Fx (Gy) Completed Fx Beam Energies  Lung, Left: Lung_Lt_lower IMRT 60/60 12 5/5 6XFFF   CHIEF COMPLAINT:  Here for follow-up and surveillance of lung/ throat cancer  Narrative:    Anita Pollard presents today for follow-up after completing radiation to her supraglottis on 03/31/2020 and left lower lung on 01/14/2021 to review most recent chest CT.  CT of the chest on 07/28/22 revealed slight interval increase in size of the subsolid nodule in the posterior RUL measuring 1.9 x 1.5 cm, previously measuring 1.7 x 1.3 cm.   Pain issues, if any: Denies Using a feeding tube?: N/A Respiratory symptoms: Denies any new issues or concerns. Constant drainage Weight changes, if any:     Wt Readings from Last 3 Encounters:  08/01/22 123 lb 3.2 oz (55.9 kg)  07/17/22 126 lb (57.2 kg)  07/12/22 122 lb 9.6 oz (55.6 kg)    Swallowing issues, if any: Denies--reports her only concern is the frequent nasal drainage that sticks to her upper throat (but denies that it  impairs her ability to swallow) Smoking or chewing tobacco? None Using fluoride toothpaste? Yes--reports she has had 4 crowns placed since her last visit Last ENT visit was on: Dr. Jenne Pane on 05-09-22 with clean scope --Procedure: Transnasal fiberoptic laryngoscopy The fiberoptic laryngoscope was then placed through the nasal passage to view the pharynx and larynx. After completion, the telescope was removed. Findings included normal nasal passages, no mass or abnormality in the nasopharynx, and no mass or ulceration in the pharynx or larynx. Pyriform sinuses are open. Secretions are minimal. Vocal folds are without mass, scarring, or ulceration. The vocal folds adduct and abduct symmetrically. There is good glottal closure. Muscle tension patterns are not present. Laryngeal edema is moderate and general. --Impression & Plans:  Anita Pollard is a 82 y.o. female with history of supraglottic carcinoma. - She is now over 2 years from treatment with no evidence of recurrence. She was encouraged. Her continued throat symptoms relate to treatment. She can follow-up with me in six months.   Other notable issues, if any: Was hospitalized last month for CHF. Last saw her medical oncologist Dr. Al Pimple on 07/12/2022. Reports right thigh pain that has changed from intermittent to constant (confirms she is seeing PCP tomorrow to address)  Vitals:   08/01/22 1404  BP: (!) 158/52  Pulse: 65  Resp: 18  Temp: 97.6 F (36.4 C)  SpO2: 98%     ALLERGIES:  is allergic to penicillins, banana,  detrol [tolterodine], quetiapine, risperidone and related, and lisinopril.  Meds: Current Outpatient Medications  Medication Sig Dispense Refill   amLODipine (NORVASC) 5 MG tablet Take 5 mg by mouth at bedtime.     carvedilol (COREG) 12.5 MG tablet Take 25 mg by mouth 2 (two) times daily.     cefadroxil (DURICEF) 500 MG capsule Take 1 capsule (500 mg total) by mouth daily. 2 capsule 0   Cholecalciferol (VITAMIN D3) 5000  UNITS TABS Take 5,000 Units by mouth 3 (three) times daily after meals.      ciprofloxacin (CIPRO) 500 MG tablet Take 1 tablet (500 mg total) by mouth daily. (Patient not taking: Reported on 07/17/2022) 5 tablet 0   CREON 3000-9500 units CPEP Take 3 capsules by mouth in the morning, at noon, and at bedtime.     ferrous sulfate 324 MG TBEC Take 324 mg by mouth 2 (two) times daily. Takes     glipiZIDE (GLUCOTROL XL) 2.5 MG 24 hr tablet Take 5 mg by mouth 2 (two) times daily.      insulin glargine, 1 Unit Dial, (TOUJEO SOLOSTAR) 300 UNIT/ML Solostar Pen Inject 5-7 Units into the skin daily.     Lancets (FREESTYLE) lancets by Does not apply route.     levothyroxine (SYNTHROID) 175 MCG tablet Take 175 mcg by mouth daily before breakfast. 6 days a week, does not take on sundays     Melatonin 5 MG CAPS Take 5 mg by mouth See admin instructions. Take 5 mg 2 hours before bedtime     Multiple Vitamins-Minerals (ICAPS AREDS 2 PO) Take 1 tablet by mouth 2 (two) times daily.     omeprazole (PRILOSEC) 20 MG capsule Take 20 mg by mouth daily.     Polyethyl Glycol-Propyl Glycol (SYSTANE OP) Place 1 drop into both eyes every evening.     potassium chloride (MICRO-K) 10 MEQ CR capsule Take 20 mEq by mouth 2 (two) times daily. 20mg  in the am 10mg  in the afternoon and 10mg  at night     pramipexole (MIRAPEX) 0.25 MG tablet Take 0.25-0.5 mg by mouth See admin instructions. Take 0.5 mg 2 hours prior to bedtime then 0.25 mg at bedtime     predniSONE (DELTASONE) 10 MG tablet Take 5 mg by mouth daily with breakfast.     raloxifene (EVISTA) 60 MG tablet Take 60 mg by mouth daily.     rosuvastatin (CRESTOR) 5 MG tablet Take 1 tablet (5 mg total) by mouth 2 (two) times a week. Take on Mondays and Thursdays. 30 tablet 3   sulfamethoxazole-trimethoprim (BACTRIM) 400-80 MG tablet Take 1 tablet by mouth 3 (three) times a week. Monday Wednesday friday     tacrolimus (PROGRAF) 0.5 MG capsule Take 2 mg by mouth every 12 (twelve)  hours.     temazepam (RESTORIL) 15 MG capsule Take 15-30 mg by mouth at bedtime.      torsemide (DEMADEX) 20 MG tablet Take 3 tablets (60 mg total) by mouth daily. 90 tablet 0   vitamin B-12 (CYANOCOBALAMIN) 500 MCG tablet Take 500 mcg by mouth daily.     No current facility-administered medications for this encounter.    Physical Findings: The patient is in no acute distress. Patient is alert and oriented.  Wt Readings from Last 3 Encounters:  08/01/22 123 lb 3.2 oz (55.9 kg)  07/17/22 126 lb (57.2 kg)  07/12/22 122 lb 9.6 oz (55.6 kg)    weight is 123 lb 3.2 oz (55.9 kg). Her temperature is 97.6  F (36.4 C). Her blood pressure is 158/52 (abnormal) and her pulse is 65. Her respiration is 18 and oxygen saturation is 98%. .  General: Alert and oriented, in no acute distress with mild stable hoarseness HEENT: Head is normocephalic. Extraocular movements are intact.  Neck: Neck is supple, no palpable cervical or supraclavicular lymphadenopathy. Heart: Regular in rate and rhythm with no murmurs, rubs, or gallops. Chest: Clear to auscultation bilaterally, with no rhonchi, wheezes, or rales. Abd: normoactive bowel movements throughout, no tenderness to palpation.  Lymphatics: see Neck Exam Musculoskeletal: symmetric strength and muscle tone throughout. Used a cane to ambulate. Neurologic: Cranial nerves II through XII are grossly intact. No obvious focalities. Speech is fluent. Coordination is intact. Psychiatric: Judgment and insight are intact. Affect is appropriate.   Lab Findings: Lab Results  Component Value Date   WBC 3.3 (L) 07/02/2022   HGB 9.0 (L) 07/02/2022   HCT 27.7 (L) 07/02/2022   MCV 104.9 (H) 07/02/2022   PLT 135 (L) 07/02/2022    Lab Results  Component Value Date   TSH 0.109 (L) 05/23/2022    Radiographic Findings: CT Chest Wo Contrast  Result Date: 08/01/2022 CLINICAL DATA:  Squamous cell carcinoma of epiglottis, left lower lobe lung cancer * Tracking Code:  BO * EXAM: CT CHEST WITHOUT CONTRAST TECHNIQUE: Multidetector CT imaging of the chest was performed following the standard protocol without IV contrast. RADIATION DOSE REDUCTION: This exam was performed according to the departmental dose-optimization program which includes automated exposure control, adjustment of the mA and/or kV according to patient size and/or use of iterative reconstruction technique. COMPARISON:  01/24/2022, 12/02/2020 FINDINGS: Cardiovascular: Aortic atherosclerosis. Normal heart size. Left coronary artery calcifications. No pericardial effusion. Mediastinum/Nodes: No enlarged mediastinal, hilar, or axillary lymph nodes. Thyroidectomy clips. Trachea and esophagus otherwise demonstrate no significant findings. Lungs/Pleura: Moderate centrilobular and paraseptal emphysema. Unchanged bandlike post treatment appearance of the posterior superior segment left lower lobe, without clearly visible residual mass or nodule in this vicinity (series 5, image 64). Slight interval increase in size and solid character of a subsolid nodule of the posterior right upper lobe, measuring 1.9 x 1.6 cm, previously 1.7 x 1.3 cm when measured similarly (series 5, image 35). New, scattered centrilobular ground-glass and tree-in-bud nodules, for example in the anterior lingula (series 5, image 74). No pleural effusion or pneumothorax. Upper Abdomen: No acute abnormality. Coarse contour of the liver. Atrophic kidneys. Coarsely calcified pancreatic parenchyma. 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 Musculoskeletal: No chest wall abnormality. No acute osseous findings. IMPRESSION: 1. Unchanged bandlike post treatment/post radiation appearance of the posterior superior segment left lower lobe, without clearly visible residual mass or nodule in this vicinity. 2. Slight interval increase in size and solid character of a subsolid nodule of the posterior right upper lobe, measuring 1.9 x 1.6 cm, previously 1.7  x 1.3 cm when measured similarly. This has clearly progressed over a longer period of time on examinations dating back to at least 12/02/2020, and is consistent with a slowly enlarging, indolent adenocarcinoma. 3. New, scattered nonspecific infectious or inflammatory centrilobular ground-glass and tree-in-bud nodules. Attention on follow-up. 4. Emphysema. 5. Coronary artery disease. Aortic Atherosclerosis (ICD10-I70.0) and Emphysema (ICD10-J43.9). Electronically Signed   By: Jearld Lesch M.D.   On: 08/01/2022 08:52     Impression/Plan:    1) Head and Neck Cancer / lung cancer Status: We personally reviewed her chest imaging  - CT imaging continues to show likely scar tissue from therapy in the left lower lung.  A slight interval increase in size was seen in the RUL lung nodule suspicious for low grade adenocarcinoma. We discussed her treatment options of active surveillance, biopsy, or SBRT. We discussed the pros and cons of each treatment option. At the conclusion of our conversation, she would like to proceed with active surveillance. Given the indolent nature of this nodule, the patient's age, and co-morbidities, Dr. Basilio Cairo agrees this is a reasonable option. We will follow up with a CT scan in 6 months to re-assess the lung nodule.   2) Nutritional Status: Stable  Wt Readings from Last 3 Encounters:  08/01/22 123 lb 3.2 oz (55.9 kg)  07/17/22 126 lb (57.2 kg)  07/12/22 122 lb 9.6 oz (55.6 kg)    3) Risk Factors: The patient has been educated about risk factors including alcohol and tobacco abuse; they understand that avoidance of alcohol and tobacco is important to prevent recurrences as well as other cancers   4) Swallowing: Denies any dysphagia. Increased mucus production noted  5) Dental: Encouraged to continue regular followup with dentistry, and dental hygiene including fluoride rinses.   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  She will continue supplementation and labs per her PCP/endocrinologist's instructions.   She is aware of her recent abnormal TSH in the cancer center from her appointment with medical oncology.  She will discuss this with her PCP and endocrinologist.  Lab Results  Component Value Date   TSH 0.109 (L) 05/23/2022      7) Patient knows to continue w/ ENT for laryngoscopy surveillance, and I will see her back in 6 months to review her chest CT scan.   8) Ongoing right thigh pain. Patient states she will address with her PCP tomorrow.   On date of service I spent 30 minutes on this encounter.  Patient was seen face-to-face.      Joyice Faster, PA-C    Lonie Peak, MD

## 2022-08-02 DIAGNOSIS — M79604 Pain in right leg: Secondary | ICD-10-CM | POA: Diagnosis not present

## 2022-08-02 DIAGNOSIS — I130 Hypertensive heart and chronic kidney disease with heart failure and stage 1 through stage 4 chronic kidney disease, or unspecified chronic kidney disease: Secondary | ICD-10-CM | POA: Diagnosis not present

## 2022-08-02 DIAGNOSIS — R609 Edema, unspecified: Secondary | ICD-10-CM | POA: Diagnosis not present

## 2022-08-02 DIAGNOSIS — J439 Emphysema, unspecified: Secondary | ICD-10-CM | POA: Diagnosis not present

## 2022-08-02 DIAGNOSIS — I509 Heart failure, unspecified: Secondary | ICD-10-CM | POA: Diagnosis not present

## 2022-08-02 DIAGNOSIS — N183 Chronic kidney disease, stage 3 unspecified: Secondary | ICD-10-CM | POA: Diagnosis not present

## 2022-08-02 DIAGNOSIS — Z6824 Body mass index (BMI) 24.0-24.9, adult: Secondary | ICD-10-CM | POA: Diagnosis not present

## 2022-08-02 DIAGNOSIS — Z85118 Personal history of other malignant neoplasm of bronchus and lung: Secondary | ICD-10-CM | POA: Diagnosis not present

## 2022-08-03 DIAGNOSIS — E1165 Type 2 diabetes mellitus with hyperglycemia: Secondary | ICD-10-CM | POA: Diagnosis not present

## 2022-08-03 DIAGNOSIS — N183 Chronic kidney disease, stage 3 unspecified: Secondary | ICD-10-CM | POA: Diagnosis not present

## 2022-08-03 DIAGNOSIS — I509 Heart failure, unspecified: Secondary | ICD-10-CM | POA: Diagnosis not present

## 2022-08-03 DIAGNOSIS — E890 Postprocedural hypothyroidism: Secondary | ICD-10-CM | POA: Diagnosis not present

## 2022-08-10 DIAGNOSIS — E1165 Type 2 diabetes mellitus with hyperglycemia: Secondary | ICD-10-CM | POA: Diagnosis not present

## 2022-08-10 DIAGNOSIS — M25551 Pain in right hip: Secondary | ICD-10-CM | POA: Diagnosis not present

## 2022-08-10 DIAGNOSIS — E890 Postprocedural hypothyroidism: Secondary | ICD-10-CM | POA: Diagnosis not present

## 2022-08-10 DIAGNOSIS — I509 Heart failure, unspecified: Secondary | ICD-10-CM | POA: Diagnosis not present

## 2022-08-10 DIAGNOSIS — M25512 Pain in left shoulder: Secondary | ICD-10-CM | POA: Diagnosis not present

## 2022-08-14 ENCOUNTER — Ambulatory Visit: Payer: Medicare Other | Admitting: Cardiology

## 2022-08-15 ENCOUNTER — Inpatient Hospital Stay: Payer: Medicare PPO | Attending: Hematology and Oncology | Admitting: Hematology and Oncology

## 2022-08-15 VITALS — BP 140/50 | HR 71 | Temp 97.9°F | Resp 16 | Ht 59.0 in | Wt 121.5 lb

## 2022-08-15 DIAGNOSIS — Z79890 Hormone replacement therapy (postmenopausal): Secondary | ICD-10-CM | POA: Diagnosis not present

## 2022-08-15 DIAGNOSIS — Z833 Family history of diabetes mellitus: Secondary | ICD-10-CM | POA: Diagnosis not present

## 2022-08-15 DIAGNOSIS — Z888 Allergy status to other drugs, medicaments and biological substances status: Secondary | ICD-10-CM | POA: Insufficient documentation

## 2022-08-15 DIAGNOSIS — Z940 Kidney transplant status: Secondary | ICD-10-CM | POA: Diagnosis not present

## 2022-08-15 DIAGNOSIS — Z87442 Personal history of urinary calculi: Secondary | ICD-10-CM | POA: Diagnosis not present

## 2022-08-15 DIAGNOSIS — Z8719 Personal history of other diseases of the digestive system: Secondary | ICD-10-CM | POA: Insufficient documentation

## 2022-08-15 DIAGNOSIS — I2510 Atherosclerotic heart disease of native coronary artery without angina pectoris: Secondary | ICD-10-CM | POA: Diagnosis not present

## 2022-08-15 DIAGNOSIS — N184 Chronic kidney disease, stage 4 (severe): Secondary | ICD-10-CM | POA: Insufficient documentation

## 2022-08-15 DIAGNOSIS — Z808 Family history of malignant neoplasm of other organs or systems: Secondary | ICD-10-CM | POA: Insufficient documentation

## 2022-08-15 DIAGNOSIS — I129 Hypertensive chronic kidney disease with stage 1 through stage 4 chronic kidney disease, or unspecified chronic kidney disease: Secondary | ICD-10-CM | POA: Diagnosis not present

## 2022-08-15 DIAGNOSIS — Z9049 Acquired absence of other specified parts of digestive tract: Secondary | ICD-10-CM | POA: Diagnosis not present

## 2022-08-15 DIAGNOSIS — Z8521 Personal history of malignant neoplasm of larynx: Secondary | ICD-10-CM | POA: Insufficient documentation

## 2022-08-15 DIAGNOSIS — Z87891 Personal history of nicotine dependence: Secondary | ICD-10-CM | POA: Diagnosis not present

## 2022-08-15 DIAGNOSIS — Z79899 Other long term (current) drug therapy: Secondary | ICD-10-CM | POA: Insufficient documentation

## 2022-08-15 DIAGNOSIS — Z818 Family history of other mental and behavioral disorders: Secondary | ICD-10-CM | POA: Insufficient documentation

## 2022-08-15 DIAGNOSIS — I700 Atherosclerosis of aorta: Secondary | ICD-10-CM | POA: Insufficient documentation

## 2022-08-15 DIAGNOSIS — D649 Anemia, unspecified: Secondary | ICD-10-CM | POA: Insufficient documentation

## 2022-08-15 DIAGNOSIS — Z8249 Family history of ischemic heart disease and other diseases of the circulatory system: Secondary | ICD-10-CM | POA: Insufficient documentation

## 2022-08-15 DIAGNOSIS — Z880 Allergy status to penicillin: Secondary | ICD-10-CM | POA: Insufficient documentation

## 2022-08-15 DIAGNOSIS — Z83719 Family history of colon polyps, unspecified: Secondary | ICD-10-CM | POA: Insufficient documentation

## 2022-08-15 DIAGNOSIS — Z853 Personal history of malignant neoplasm of breast: Secondary | ICD-10-CM | POA: Diagnosis not present

## 2022-08-15 DIAGNOSIS — E1122 Type 2 diabetes mellitus with diabetic chronic kidney disease: Secondary | ICD-10-CM | POA: Diagnosis not present

## 2022-08-15 DIAGNOSIS — Z90710 Acquired absence of both cervix and uterus: Secondary | ICD-10-CM | POA: Insufficient documentation

## 2022-08-15 NOTE — Progress Notes (Signed)
[facility] CONSULT NOTE  Patient Care Team: [name], MD as PCP - General (Family Medicine) [name], MD as PCP - Cardiology (Cardiology) [name], MD as Attending Physician (Family Medicine) Lonie Peak, MD as Consulting Physician (Radiation Oncology) Malmfelt, Lise Auer, RN as Oncology Nurse Navigator [name], MD as Consulting Physician (Cardiology)  CHIEF COMPLAINTS/PURPOSE OF CONSULTATION:  Normocytic normochromic anemia   ASSESSMENT & PLAN:   This is a very pleasant 82 year old female patient with past medical history significant for diabetes, hypertension, breast cancer, malignant neoplasm of supraglottis, 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 They were ok with epo if there is contraindication such as solid tumors/active disease. Her most recent CT scan shows mild increase in the lung nodule however she elected to follow-up/surveillance.  She is not interested in any biopsies.  We could not draw labs today, she will do it with Dr. Charlott Rakes office coming week.  If her hemoglobin is stable, we will continue to follow-up every 8 weeks or so.  In the context of possible solid tumor which is slightly progressive with increasing lung nodule, I might hold off on EPO for now.  She is not a candidate for blood transfusion at this time and I clearly explained this. All her questions were answered to the best my knowledge.  She can return to clinic as scheduled in third week of May.  HISTORY OF PRESENTING ILLNESS:   Anita Pollard 82 y.o. female is here because of anemia.  This is a very pleasant 82 year old female patient with past medical history significant for right-sided breast cancer, cancer of the supraglottis status postradiation, type 2 diabetes, hypertension,  kidney transplant referred to hematology for evaluation of slowly progressive anemia. She is here for follow-up.  Since her last visit she continues to deal with chronic kidney disease and related issues.  No complaints today otherwise. Rest of the pertinent 10 point ROS reviewed and negative  MEDICAL HISTORY:  Past Medical History:  Diagnosis Date   Alcohol abuse    Anemia    Arthritis    Breast cancer (HCC) 2010   Right   Chronic kidney disease    Colon polyp    Depression    Diabetes mellitus    GERD (gastroesophageal reflux disease)    Heart murmur    History of kidney stones 1976   HTN (hypertension)    Hypothyroidism    Osteopenia    Pancreatitis    Ulcerative esophagitis     SURGICAL HISTORY: Past Surgical History:  Procedure Laterality Date   ABDOMINAL HYSTERECTOMY     ABDOMINOPLASTY     APPENDECTOMY     BREAST BIOPSY Right 08/03/2008   Stereo Bx, Malignant   BREAST LUMPECTOMY Right 08/26/2008   CATARACT EXTRACTION W/ INTRAOCULAR LENS  IMPLANT, BILATERAL     CO2 LASER APPLICATION N/A 01/09/2020   Procedure: CO2 LASER APPLICATION;  Surgeon: Christia Reading, MD;  Location: [facility] OR;  Service: ENT;  Laterality: N/A;   COSMETIC SURGERY     ductal carcinoma     right breast    EYE SURGERY     HIP ARTHROPLASTY Right    INSERTION OF DIALYSIS CATHETER Right 09/25/2012   Procedure: INSERTION OF DIALYSIS CATHETER;  Surgeon: Pryor Ochoa, MD;  Location: [facility] OR;  Service: Vascular;  Laterality: Right;  Righ Internal Jugular Placement   IR GASTROSTOMY TUBE MOD SED  02/10/2020   IR GASTROSTOMY TUBE REMOVAL  05/25/2020   JOINT REPLACEMENT     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   MICROLARYNGOSCOPY N/A 10/20/2019   Procedure: Suspended Microlaryngoscopy with Biopsy;  Surgeon: Christia Reading, MD;  Location: [facility] OR;  Service: ENT;  Laterality: N/A;   MICROLARYNGOSCOPY N/A 01/09/2020   Procedure: MICROLARYNGOSCOPY WITH BIOPSY;  Surgeon: Christia Reading, MD;  Location: [facility] OR;  Service:  ENT;  Laterality: N/A;   RIGID ESOPHAGOSCOPY N/A 10/20/2019   Procedure: RIGID ESOPHAGOSCOPY;  Surgeon: Christia Reading, MD;  Location: [facility] OR;  Service: ENT;  Laterality: N/A;   THYROIDECTOMY     TUMMY TUCK      SOCIAL HISTORY: Social History   Socioeconomic History   Marital status: Widowed    Spouse name: Not on file   Number of children: Not on file   Years of education: Not on file   Highest education level: Not on file  Occupational History   Not on file  Tobacco Use   Smoking status: Former    Types: Cigarettes    Quit date: 09/25/1983    Years since quitting: 38.9   Smokeless tobacco: Never  Vaping Use   Vaping Use: Never used  Substance and Sexual Activity   Alcohol use: Yes    Comment: socially history of heavy drinking with intervention x 2-at least 2 occasions since april '14   Drug use: No   Sexual activity: Never  Other Topics Concern   Not on file  Social History Narrative   Not on file   Social Determinants of Health   Financial Resource Strain: Low Risk  (01/22/2020)   Overall Financial Resource Strain (CARDIA)    Difficulty of Paying Living Expenses: Not hard at all  Food Insecurity: No Food Insecurity (06/30/2022)   Hunger Vital Sign    Worried About Running Out of Food in the Last Year: Never true    Ran Out of Food in the Last Year: Never true  Transportation Needs: No Transportation Needs (06/30/2022)   PRAPARE - Administrator, Civil Service (Medical): No    Lack of Transportation (Non-Medical): No  Physical Activity: Insufficiently Active (01/22/2020)   Exercise Vital Sign    Days of Exercise per Week: 5 days    Minutes of Exercise per Session: 20 min  Stress: Not on file  Social Connections: Moderately Isolated (01/22/2020)   Social Connection and Isolation Panel [NHANES]    Frequency of Communication with Friends and Family: More than three times a week    Frequency of Social Gatherings with Friends and Family: Never    Attends  Religious Services: Never    Database administrator or Organizations: Yes    Attends Banker Meetings: Never    Marital Status: Widowed  Intimate Partner Violence: Not At Risk (06/30/2022)   Humiliation, Afraid, Rape, and Kick questionnaire    Fear of Current or Ex-Partner: No    Emotionally Abused: No    Physically Abused: No    Sexually Abused: No    FAMILY HISTORY: Family History  Problem Relation Age of Onset   Hypertension Mother    Diabetes Father    Hypertension Father    Hypertension Sister    Dementia Sister    Multiple myeloma Sister    Thyroid cancer Daughter 25   Diabetes Brother    Colon polyps Brother  Atrial fibrillation Brother    Breast cancer Neg Hx     ALLERGIES:  is allergic to penicillins, banana, detrol [tolterodine], quetiapine, risperidone and related, and lisinopril.  MEDICATIONS:  Current Outpatient Medications  Medication Sig Dispense Refill   amLODipine (NORVASC) 5 MG tablet Take 5 mg by mouth at bedtime.     carvedilol (COREG) 12.5 MG tablet Take 25 mg by mouth 2 (two) times daily.     cefadroxil (DURICEF) 500 MG capsule Take 1 capsule (500 mg total) by mouth daily. 2 capsule 0   Cholecalciferol (VITAMIN D3) 5000 UNITS TABS Take 5,000 Units by mouth 3 (three) times daily after meals.      ciprofloxacin (CIPRO) 500 MG tablet Take 1 tablet (500 mg total) by mouth daily. (Patient not taking: Reported on 07/17/2022) 5 tablet 0   CREON 3000-9500 units CPEP Take 3 capsules by mouth in the morning, at noon, and at bedtime.     ferrous sulfate 324 MG TBEC Take 324 mg by mouth 2 (two) times daily. Takes     glipiZIDE (GLUCOTROL XL) 2.5 MG 24 hr tablet Take 5 mg by mouth 2 (two) times daily.      insulin glargine, 1 Unit Dial, (TOUJEO SOLOSTAR) 300 UNIT/ML Solostar Pen Inject 5-7 Units into the skin daily.     Lancets (FREESTYLE) lancets by Does not apply route.     levothyroxine (SYNTHROID) 175 MCG tablet Take 175 mcg by mouth daily before  breakfast. 6 days a week, does not take on sundays     Melatonin 5 MG CAPS Take 5 mg by mouth See admin instructions. Take 5 mg 2 hours before bedtime     Multiple Vitamins-Minerals (ICAPS AREDS 2 PO) Take 1 tablet by mouth 2 (two) times daily.     omeprazole (PRILOSEC) 20 MG capsule Take 20 mg by mouth daily.     Polyethyl Glycol-Propyl Glycol (SYSTANE OP) Place 1 drop into both eyes every evening.     potassium chloride (MICRO-K) 10 MEQ CR capsule Take 20 mEq by mouth 2 (two) times daily. 20mg  in the am 10mg  in the afternoon and 10mg  at night     pramipexole (MIRAPEX) 0.25 MG tablet Take 0.25-0.5 mg by mouth See admin instructions. Take 0.5 mg 2 hours prior to bedtime then 0.25 mg at bedtime     predniSONE (DELTASONE) 10 MG tablet Take 5 mg by mouth daily with breakfast.     raloxifene (EVISTA) 60 MG tablet Take 60 mg by mouth daily.     rosuvastatin (CRESTOR) 5 MG tablet Take 1 tablet (5 mg total) by mouth 2 (two) times a week. Take on Mondays and Thursdays. 30 tablet 3   sulfamethoxazole-trimethoprim (BACTRIM) 400-80 MG tablet Take 1 tablet by mouth 3 (three) times a week. Monday Wednesday friday     tacrolimus (PROGRAF) 0.5 MG capsule Take 2 mg by mouth every 12 (twelve) hours.     temazepam (RESTORIL) 15 MG capsule Take 15-30 mg by mouth at bedtime.      torsemide (DEMADEX) 20 MG tablet Take 3 tablets (60 mg total) by mouth daily. 90 tablet 0   vitamin B-12 (CYANOCOBALAMIN) 500 MCG tablet Take 500 mcg by mouth daily.     No current facility-administered medications for this visit.   PHYSICAL EXAMINATION:  ECOG PERFORMANCE STATUS: 0 - Asymptomatic  Vitals:   08/15/22 1555  BP: (!) 140/50  Pulse: 71  Resp: 16  Temp: 97.9 F (36.6 C)  SpO2: 97%   Physical Exam  Constitutional:      Appearance: Normal appearance.  Cardiovascular:     Rate and Rhythm: Normal rate and regular rhythm.     Pulses: Normal pulses.     Heart sounds: Normal heart sounds.  Pulmonary:     Effort:  Pulmonary effort is normal.     Breath sounds: Normal breath sounds.  Abdominal:     General: Abdomen is flat.     Palpations: Abdomen is soft.  Musculoskeletal:        General: Swelling (Venous stasis changes with bilateral lower extremity swelling, no change) present.     Cervical back: Normal range of motion. No rigidity.  Lymphadenopathy:     Cervical: No cervical adenopathy.  Skin:    General: Skin is warm and dry.  Neurological:     General: No focal deficit present.     Mental Status: She is alert.      LABORATORY DATA:  I have reviewed the data as listed Lab Results  Component Value Date   WBC 3.3 (L) 07/02/2022   HGB 9.0 (L) 07/02/2022   HCT 27.7 (L) 07/02/2022   MCV 104.9 (H) 07/02/2022   PLT 135 (L) 07/02/2022     Chemistry      Component Value Date/Time   NA 141 07/17/2022 1524   NA 140 08/28/2013 0955   K 4.2 07/17/2022 1524   K 3.0 (LL) 08/28/2013 0955   CL 106 07/17/2022 1524   CL 111 (H) 09/03/2012 1249   CO2 20 07/17/2022 1524   CO2 16 (L) 08/28/2013 0955   BUN 57 (H) 07/17/2022 1524   BUN 75.7 (H) 08/28/2013 0955   CREATININE 2.08 (H) 07/17/2022 1524   CREATININE 1.67 (H) 09/28/2020 1432   CREATININE 5.9 (HH) 08/28/2013 0955      Component Value Date/Time   CALCIUM 8.6 (L) 07/17/2022 1524   CALCIUM 7.5 (L) 08/28/2013 0955   ALKPHOS 72 06/29/2022 1953   ALKPHOS 62 08/28/2013 0955   AST 26 06/29/2022 1953   AST 15 09/28/2020 1432   AST 15 08/28/2013 0955   ALT 25 06/29/2022 1953   ALT 15 09/28/2020 1432   ALT 17 08/28/2013 0955   BILITOT 0.7 06/29/2022 1953   BILITOT 0.3 09/28/2020 1432   BILITOT 0.38 08/28/2013 0955     Reviewed labs from [facility] done on December 14.  Her white blood cell count of 5.7, hemoglobin is 10.3, platelet count of 210,000, absolute lymphocytes were low and absolute eosinophils were low, otherwise iron panel showed a ferritin of 46 and total iron binding capacity of 326, iron saturation of 25%.   07/21/2021, CBC  showed white blood cell count of 6000, hemoglobin of 10, hematocrit of 30.2 and platelet count of 187,000. Iron and iron binding panel normal.  B12 is normal.  01/19/2022 CBC showed hemoglobin of 9.2 g/dL, total white blood cell count of 5.6 and platelet count of 183,000.  Iron panel with increasing total iron binding capacity suggestive of iron deficiency, ferritin pending  I spoke with Dr Gardiner Ramus and he suggested she has CKD 4, ferritin 33. I couldn't see the labs in care everywhere.  RADIOGRAPHIC STUDIES: I have personally reviewed the radiological images as listed and agreed with the findings in the report. CT Chest Wo Contrast  Result Date: 08/01/2022 CLINICAL DATA:  Squamous cell carcinoma of epiglottis, left lower lobe lung cancer * Tracking Code: BO * EXAM: CT CHEST WITHOUT CONTRAST TECHNIQUE: Multidetector CT imaging of the chest was performed following the standard  protocol without IV contrast. RADIATION DOSE REDUCTION: This exam was performed according to the departmental dose-optimization program which includes automated exposure control, adjustment of the mA and/or kV according to patient size and/or use of iterative reconstruction technique. COMPARISON:  01/24/2022, 12/02/2020 FINDINGS: Cardiovascular: Aortic atherosclerosis. Normal heart size. Left coronary artery calcifications. No pericardial effusion. Mediastinum/Nodes: No enlarged mediastinal, hilar, or axillary lymph nodes. Thyroidectomy clips. Trachea and esophagus otherwise demonstrate no significant findings. Lungs/Pleura: Moderate centrilobular and paraseptal emphysema. Unchanged bandlike post treatment appearance of the posterior superior segment left lower lobe, without clearly visible residual mass or nodule in this vicinity (series 5, image 64). Slight interval increase in size and solid character of a subsolid nodule of the posterior right upper lobe, measuring 1.9 x 1.6 cm, previously 1.7 x 1.3 cm when measured similarly (series  5, image 35). New, scattered centrilobular ground-glass and tree-in-bud nodules, for example in the anterior lingula (series 5, image 74). No pleural effusion or pneumothorax. Upper Abdomen: No acute abnormality. Coarse contour of the liver. Atrophic kidneys. Coarsely calcified pancreatic parenchyma. 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 Musculoskeletal: No chest wall abnormality. No acute osseous findings. IMPRESSION: 1. Unchanged bandlike post treatment/post radiation appearance of the posterior superior segment left lower lobe, without clearly visible residual mass or nodule in this vicinity. 2. Slight interval increase in size and solid character of a subsolid nodule of the posterior right upper lobe, measuring 1.9 x 1.6 cm, previously 1.7 x 1.3 cm when measured similarly. This has clearly progressed over a longer period of time on examinations dating back to at least 12/02/2020, and is consistent with a slowly enlarging, indolent adenocarcinoma. 3. New, scattered nonspecific infectious or inflammatory centrilobular ground-glass and tree-in-bud nodules. Attention on follow-up. 4. Emphysema. 5. Coronary artery disease. Aortic Atherosclerosis (ICD10-I70.0) and Emphysema (ICD10-J43.9). Electronically Signed   By: Jearld Lesch M.D.   On: 08/01/2022 08:52    Total time spent: 30 minutes including history, review of recent records, labs, counseling and coordination of care     Rachel Moulds, MD 08/15/2022 4:05 PM

## 2022-08-16 ENCOUNTER — Other Ambulatory Visit: Payer: Self-pay | Admitting: Family Medicine

## 2022-08-16 ENCOUNTER — Telehealth: Payer: Self-pay

## 2022-08-16 ENCOUNTER — Telehealth: Payer: Self-pay | Admitting: Hematology and Oncology

## 2022-08-16 ENCOUNTER — Telehealth: Payer: Self-pay | Admitting: *Deleted

## 2022-08-16 DIAGNOSIS — Z1231 Encounter for screening mammogram for malignant neoplasm of breast: Secondary | ICD-10-CM

## 2022-08-16 NOTE — Telephone Encounter (Signed)
Pt called and had left a message with office staff. Rn called pt back and she stated she had just seen Dr. Al Pimple for anemia concerns. Per pt Dr. Al Pimple could not start injections for her blood counts until the Lung spots had been addressed. The pt stated that she would like to proceed with the 5 treatments that Dr. Basilio Cairo discussed with her during her last appointment. She did however state that she had a beach trip planned with her family on June 8-15. This is a special trip for her family as her daughter is now battling Thyroid cancer for the second time (with not much that can do for treatment). She has arranged this trip so they can all be together. She would like to have treatment after this trip. Rn stated she would call pt back on Friday with Dr. Colletta Maryland feedback and update. Pt was grateful for the phone call.

## 2022-08-16 NOTE — Telephone Encounter (Signed)
Spoke with patient confirming upcoming appointment  

## 2022-08-16 NOTE — Telephone Encounter (Signed)
Orders for labs with dx codes,expected date and return fax number - faxed to [facility] at Dr Charlott Rakes at [phone].

## 2022-08-17 ENCOUNTER — Telehealth: Payer: Self-pay

## 2022-08-17 NOTE — Telephone Encounter (Signed)
Rn called to verify that patient knew of plan for CT sim and follow up with Dr. Basilio Cairo on 09-15-22. RN went over plan with pt and she understood. She was appreciative of the telephone call and planning.

## 2022-08-17 NOTE — Telephone Encounter (Signed)
Rn called pt to provide update from Dr. Basilio Cairo from previous phone call about started Radiation treatment for lung nodule. Pt was given options of times and selected 09-15-22 at 3pm for Ct sim (she does not like early appointments). Rn notified CT sim and will work on scheduling follow up with Dr. Basilio Cairo prior to this Ct sim time. Rn also discussed with pt that this radiation would still keep her options open for possible (but not guaranteed) EPO injections as well, if they were deemed necessary. Rn will call back pt with scheduled dates and times for ct sim and follow up with Dr. Basilio Cairo. Pt was grateful for this phone call.

## 2022-08-18 DIAGNOSIS — D649 Anemia, unspecified: Secondary | ICD-10-CM | POA: Diagnosis not present

## 2022-08-18 DIAGNOSIS — N183 Chronic kidney disease, stage 3 unspecified: Secondary | ICD-10-CM | POA: Diagnosis not present

## 2022-08-18 DIAGNOSIS — E1142 Type 2 diabetes mellitus with diabetic polyneuropathy: Secondary | ICD-10-CM | POA: Diagnosis not present

## 2022-08-22 DIAGNOSIS — N184 Chronic kidney disease, stage 4 (severe): Secondary | ICD-10-CM | POA: Diagnosis not present

## 2022-08-22 DIAGNOSIS — T8619 Other complication of kidney transplant: Secondary | ICD-10-CM | POA: Diagnosis not present

## 2022-08-22 DIAGNOSIS — E1122 Type 2 diabetes mellitus with diabetic chronic kidney disease: Secondary | ICD-10-CM | POA: Diagnosis not present

## 2022-08-22 DIAGNOSIS — D84821 Immunodeficiency due to drugs: Secondary | ICD-10-CM | POA: Diagnosis not present

## 2022-08-22 DIAGNOSIS — E876 Hypokalemia: Secondary | ICD-10-CM | POA: Diagnosis not present

## 2022-08-22 DIAGNOSIS — I129 Hypertensive chronic kidney disease with stage 1 through stage 4 chronic kidney disease, or unspecified chronic kidney disease: Secondary | ICD-10-CM | POA: Diagnosis not present

## 2022-08-22 DIAGNOSIS — I5022 Chronic systolic (congestive) heart failure: Secondary | ICD-10-CM | POA: Diagnosis not present

## 2022-08-22 DIAGNOSIS — D649 Anemia, unspecified: Secondary | ICD-10-CM | POA: Diagnosis not present

## 2022-08-29 DIAGNOSIS — Z0000 Encounter for general adult medical examination without abnormal findings: Secondary | ICD-10-CM | POA: Diagnosis not present

## 2022-08-29 DIAGNOSIS — M75102 Unspecified rotator cuff tear or rupture of left shoulder, not specified as traumatic: Secondary | ICD-10-CM | POA: Diagnosis not present

## 2022-08-29 DIAGNOSIS — M25512 Pain in left shoulder: Secondary | ICD-10-CM | POA: Diagnosis not present

## 2022-09-04 NOTE — Progress Notes (Signed)
[name] presents today for follow-up after completing radiation to her supraglottis on 03/31/2020 and left lower lung on 01/14/2021. Pt will have SBRT ct sim after her appointment with Dr. Basilio Cairo today for enlarging right upper lobe nodule.   CT Chest Wo Contrast 07/28/2022  IMPRESSION: 1. Unchanged bandlike post treatment/post radiation appearance of the posterior superior segment left lower lobe, without clearly visible residual mass or nodule in this vicinity. 2. Slight interval increase in size and solid character of a subsolid nodule of the posterior right upper lobe, measuring 1.9 x 1.6 cm, previously 1.7 x 1.3 cm when measured similarly. This has clearly progressed over a longer period of time on examinations dating back to at least 12/02/2020, and is consistent with a slowly enlarging, indolent adenocarcinoma. 3. New, scattered nonspecific infectious or inflammatory centrilobular ground-glass and tree-in-bud nodules. Attention on follow-up. 4. Emphysema. 5. Coronary artery disease.  PAIN: 6/10 right leg pain with ambulation (has had on and off for years),   RESPIRATORY: None. Pt is on room air. Noted: no skin issues to report   SWALLOWING/DIET: Pt denies dysphagia . The patient eats a regular, healthy diet..  OTHER: Pt complains of fatigue and weakness. This has been increasing over the past week. No questions or concerns at this time.   Vitals:   09/15/22 1350  BP: (!) 151/61  Pulse: 72  Resp: 20  Temp: 97.8 F (36.6 C)  SpO2: 100%   Wt Readings from Last 3 Encounters:  09/15/22 123 lb 12.8 oz (56.2 kg)  08/15/22 121 lb 8 oz (55.1 kg)  08/01/22 123 lb 3.2 oz (55.9 kg)     There were no vitals taken for this visit.   Wt Readings from Last 3 Encounters:  08/15/22 121 lb 8 oz (55.1 kg)  08/01/22 123 lb 3.2 oz (55.9 kg)  07/17/22 126 lb (57.2 kg)    Medical oncology:  Dr. Al Pimple on 08-15-22 HISTORY OF PRESENTING ILLNESS:    Anita Pollard 82 y.o.  female is here because of anemia.   This is a very pleasant 82 year old female patient with past medical history significant for right-sided breast cancer, cancer of the supraglottis status postradiation, type 2 diabetes, hypertension, kidney transplant referred to hematology for evaluation of slowly progressive anemia. She is here for follow-up.  Since her last visit she continues to deal with chronic kidney disease and related issues.  No complaints today otherwise. Rest of the pertinent 10 point ROS reviewed and negative   Dr. 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 They were ok with epo if there is contraindication such as solid tumors/active disease. Her most recent CT scan shows mild increase in the lung nodule however she elected to follow-up/surveillance.  She is not interested in any biopsies.  We could not draw labs today, she will do it with Dr. Charlott Rakes office coming week.  If her hemoglobin is stable, we will continue to follow-up every 8 weeks or so.  In the context of possible solid tumor which is slightly progressive with increasing lung nodule, I might hold off on EPO for now.  She is not a candidate for blood  transfusion at this time and I clearly explained this. All her questions were answered to the best my knowledge.  She can return to clinic as scheduled in third week of May.

## 2022-09-14 ENCOUNTER — Ambulatory Visit
Admission: RE | Admit: 2022-09-14 | Discharge: 2022-09-14 | Disposition: A | Discharge status: Home / Self Care | Payer: Medicare PPO | Source: Ambulatory Visit | Attending: Family Medicine | Admitting: Family Medicine

## 2022-09-14 DIAGNOSIS — Z1231 Encounter for screening mammogram for malignant neoplasm of breast: Secondary | ICD-10-CM | POA: Diagnosis not present

## 2022-09-15 ENCOUNTER — Encounter: Payer: Self-pay | Admitting: Radiation Oncology

## 2022-09-15 ENCOUNTER — Other Ambulatory Visit: Payer: Self-pay

## 2022-09-15 ENCOUNTER — Ambulatory Visit
Admission: RE | Admit: 2022-09-15 | Discharge: 2022-09-15 | Disposition: A | Discharge status: Home / Self Care | Payer: Medicare PPO | Source: Ambulatory Visit | Attending: Radiation Oncology | Admitting: Radiation Oncology

## 2022-09-15 VITALS — BP 151/61 | HR 72 | Temp 97.8°F | Resp 20 | Ht 59.0 in | Wt 123.8 lb

## 2022-09-15 DIAGNOSIS — C3411 Malignant neoplasm of upper lobe, right bronchus or lung: Secondary | ICD-10-CM | POA: Insufficient documentation

## 2022-09-15 DIAGNOSIS — Z792 Long term (current) use of antibiotics: Secondary | ICD-10-CM | POA: Insufficient documentation

## 2022-09-15 DIAGNOSIS — Z79899 Other long term (current) drug therapy: Secondary | ICD-10-CM | POA: Diagnosis not present

## 2022-09-15 DIAGNOSIS — Z85818 Personal history of malignant neoplasm of other sites of lip, oral cavity, and pharynx: Secondary | ICD-10-CM | POA: Insufficient documentation

## 2022-09-15 DIAGNOSIS — Z7984 Long term (current) use of oral hypoglycemic drugs: Secondary | ICD-10-CM | POA: Diagnosis not present

## 2022-09-15 DIAGNOSIS — Z7952 Long term (current) use of systemic steroids: Secondary | ICD-10-CM | POA: Insufficient documentation

## 2022-09-15 DIAGNOSIS — Z79890 Hormone replacement therapy (postmenopausal): Secondary | ICD-10-CM | POA: Diagnosis not present

## 2022-09-15 DIAGNOSIS — E039 Hypothyroidism, unspecified: Secondary | ICD-10-CM | POA: Diagnosis not present

## 2022-09-15 DIAGNOSIS — C321 Malignant neoplasm of supraglottis: Secondary | ICD-10-CM | POA: Diagnosis not present

## 2022-09-15 DIAGNOSIS — C3432 Malignant neoplasm of lower lobe, left bronchus or lung: Secondary | ICD-10-CM | POA: Diagnosis not present

## 2022-09-15 NOTE — Progress Notes (Signed)
Radiation Oncology         [phone] ________________________________  Name: Anita Pollard MRN: [id number]  Date: 09/15/2022  DOB: 1940/08/13  Follow-Up Visit Note   OUTPATIENT, in person   CC: Anita Floro, MD  Anita Reading, MD  Diagnosis and Prior Radiotherapy:       ICD-10-CM   1. 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[facility]) Staging form: Larynx - Supraglottis, AJCC 8th Edition - Clinical stage from 01/20/2020: Stage II (cT2, cN0, cM0) - Signed by Lonie Peak, MD on 06/22/2020 Stage prefix: Initial diagnosis   Radiation Treatment Dates: 02/09/2020 through 03/31/2020 Site Technique Total Dose (Gy) Dose per Fx (Gy) Completed Fx Beam Energies  Larynx: HN_supragl IMRT 70/70 2 35/35 6X    Radiation Treatment Dates: 01/04/2021 through 01/14/2021 Site Technique Total Dose (Gy) Dose per Fx (Gy) Completed Fx Beam Energies  Lung, Left: Lung_Lt_lower IMRT 60/60 12 5/5 6XFFF   CHIEF COMPLAINT:  Here for follow-up and surveillance of lung/ throat cancer  Narrative:    Anita Pollard presents today for follow-up after completing radiation to her supraglottis on 03/31/2020 and left lower lung on 01/14/2021.  She returns sooner than anticipated due to her desire to have SBRT to her right upper lobe so that she is eligible for erythropoietin as needed in the future  PAIN: 6/10 right leg pain with ambulation (has had on and off for years),   RESPIRATORY: None. Pt is on room air. Noted: no skin issues to report   SWALLOWING/DIET: Pt denies dysphagia . The patient eats a regular, healthy diet..  OTHER: Pt complains of fatigue and weakness. This has been increasing over the past week. No questions or concerns at this time.   Vitals:   09/15/22 1350  BP: (!) 151/61  Pulse: 72  Resp: 20  Temp: 97.8 F (36.6 C)  SpO2: 100%   Wt Readings from Last 3 Encounters:  09/15/22 123 lb 12.8 oz  (56.2 kg)  08/15/22 121 lb 8 oz (55.1 kg)  08/01/22 123 lb 3.2 oz (55.9 kg)       ALLERGIES:  is allergic to penicillins, banana, detrol [tolterodine], quetiapine, risperidone and related, and lisinopril.  Meds: Current Outpatient Medications  Medication Sig Dispense Refill   carvedilol (COREG) 12.5 MG tablet Take 25 mg by mouth 2 (two) times daily.     cefadroxil (DURICEF) 500 MG capsule Take 1 capsule (500 mg total) by mouth daily. 2 capsule 0   Cholecalciferol (VITAMIN D3) 5000 UNITS TABS Take 5,000 Units by mouth 3 (three) times daily after meals.      ciprofloxacin (CIPRO) 500 MG tablet Take 1 tablet (500 mg total) by mouth daily. 5 tablet 0   CREON 3000-9500 units CPEP Take 3 capsules by mouth in the morning, at noon, and at bedtime.     ferrous sulfate 324 MG TBEC Take 324 mg by mouth 2 (two) times daily. Takes     glipiZIDE (GLUCOTROL XL) 2.5 MG 24 hr tablet Take 5 mg by mouth 2 (two) times daily.      insulin glargine, 1 Unit Dial, (TOUJEO SOLOSTAR) 300 UNIT/ML Solostar Pen Inject 5-7 Units into the skin daily.     Lancets (FREESTYLE) lancets by Does not apply route.     levothyroxine (SYNTHROID) 175 MCG tablet Take 175 mcg by mouth daily before breakfast. 6 days a week, does not take on  sundays     Melatonin 5 MG CAPS Take 5 mg by mouth See admin instructions. Take 5 mg 2 hours before bedtime     Multiple Vitamins-Minerals (ICAPS AREDS 2 PO) Take 1 tablet by mouth 2 (two) times daily.     omeprazole (PRILOSEC) 20 MG capsule Take 20 mg by mouth daily.     Polyethyl Glycol-Propyl Glycol (SYSTANE OP) Place 1 drop into both eyes every evening.     potassium chloride (MICRO-K) 10 MEQ CR capsule Take 20 mEq by mouth 2 (two) times daily. 20mg  in the am 10mg  in the afternoon and 10mg  at night     pramipexole (MIRAPEX) 0.25 MG tablet Take 0.25-0.5 mg by mouth See admin instructions. Take 0.5 mg 2 hours prior to bedtime then 0.25 mg at bedtime     predniSONE (DELTASONE) 10 MG tablet Take  5 mg by mouth daily with breakfast.     raloxifene (EVISTA) 60 MG tablet Take 60 mg by mouth daily.     rosuvastatin (CRESTOR) 5 MG tablet Take 1 tablet (5 mg total) by mouth 2 (two) times a week. Take on Mondays and Thursdays. 30 tablet 3   sulfamethoxazole-trimethoprim (BACTRIM) 400-80 MG tablet Take 1 tablet by mouth 3 (three) times a week. Monday Wednesday friday     tacrolimus (PROGRAF) 0.5 MG capsule Take 2 mg by mouth every 12 (twelve) hours.     temazepam (RESTORIL) 15 MG capsule Take 15-30 mg by mouth at bedtime.      torsemide (DEMADEX) 20 MG tablet Take 3 tablets (60 mg total) by mouth daily. 90 tablet 0   vitamin B-12 (CYANOCOBALAMIN) 500 MCG tablet Take 500 mcg by mouth daily.     amLODipine (NORVASC) 5 MG tablet Take 5 mg by mouth at bedtime.     No current facility-administered medications for this encounter.    Physical Findings: The patient is in no acute distress. Patient is alert and oriented.  Wt Readings from Last 3 Encounters:  09/15/22 123 lb 12.8 oz (56.2 kg)  08/15/22 121 lb 8 oz (55.1 kg)  08/01/22 123 lb 3.2 oz (55.9 kg)    height is 4\' 11"  (1.499 m) and weight is 123 lb 12.8 oz (56.2 kg). Her temperature is 97.8 F (36.6 C). Her blood pressure is 151/61 (abnormal) and her pulse is 72. Her respiration is 20 and oxygen saturation is 100%. .  General: Alert and oriented, in no acute distress -no respiratory distress HEENT: Head is normocephalic. Extraocular movements are intact.  Musculoskeletal: Ambulatory Neurologic: Cranial nerves II through XII are grossly intact. No obvious focalities. Speech is fluent. Coordination is intact. Psychiatric: Judgment and insight are intact. Affect is appropriate.   Lab Findings: Lab Results  Component Value Date   WBC 3.3 (L) 07/02/2022   HGB 9.0 (L) 07/02/2022   HCT 27.7 (L) 07/02/2022   MCV 104.9 (H) 07/02/2022   PLT 135 (L) 07/02/2022    Lab Results  Component Value Date   TSH 0.109 (L) 05/23/2022     Radiographic Findings: No results found.   Impression/Plan:    1) Head and Neck Cancer / lung cancer Status:   CT imaging at last visit showed likely scar tissue from therapy in the left lower lung. A slight interval increase in size was seen in the RUL lung nodule suspicious for low grade adenocarcinoma. We previously discussed her treatment options of active surveillance, biopsy, or SBRT.  The patient chose active surveillance and she was going to follow-up  with me at a 67-month interval but in the interim I was informed by medical oncology that the patient would not be a candidate for erythropoietin for her anemia if she leaves this lung nodule and treated.  Therefore, I was asked to see the patient back to give SBRT to the right upper lobe lung nodule that is suspicious for adenocarcinoma.  Patient would like to proceed with this and understands that it is not guaranteed that she will need to receive erythropoietin in the future but she would like to keep this open as an option given that she has significant kidney disease and anemia.  She also understands that it is not guaranteed that the right upper lung nodule is cancerous, but it is suspicious on imaging.  She is not interested in a biopsy and therefore we will proceed with radiation planning today and start her treatment in July.  The risks benefits and potential side effects of SBRT to the right upper lung were discussed in detail.  No guarantees were given.  Consent form was signed today.  2) Nutritional Status: Stable  Wt Readings from Last 3 Encounters:  09/15/22 123 lb 12.8 oz (56.2 kg)  08/15/22 121 lb 8 oz (55.1 kg)  08/01/22 123 lb 3.2 oz (55.9 kg)    3) Risk Factors: The patient has been educated about risk factors including alcohol and tobacco abuse; they understand that avoidance of alcohol and tobacco is important to prevent recurrences as well as other cancers   4) Swallowing: Denies any dysphagia.    5) Dental:  Encouraged to continue regular followup with dentistry, and dental hygiene including fluoride rinses.   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  She will continue supplementation and labs per her PCP/endocrinologist's instructions.   She is aware of her recent abnormal TSH in the cancer center from her appointment with medical oncology.  She will discuss this with her PCP and endocrinologist.  Lab Results  Component Value Date   TSH 0.109 (L) 05/23/2022     7) Patient knows to continue w/ ENT for laryngoscopy surveillance   8) She has questions about how to donate her body to science after she passes away, even though she realizes this is not imminent.  Nursing will look into this for her and try to find how she can fill out the necessary forms.  Ms. Shreffler has a big heart and a devotion to making the world a better place.  On date of service I spent 30 minutes on this encounter.  Patient was seen face-to-face.         Lonie Peak, MD

## 2022-09-15 NOTE — Progress Notes (Signed)
Chest Ct without contrast moved to [facility] January 2025 per Dr. Basilio Cairo request.

## 2022-09-19 DIAGNOSIS — R300 Dysuria: Secondary | ICD-10-CM | POA: Diagnosis not present

## 2022-09-19 DIAGNOSIS — K644 Residual hemorrhoidal skin tags: Secondary | ICD-10-CM | POA: Diagnosis not present

## 2022-09-19 DIAGNOSIS — Z6824 Body mass index (BMI) 24.0-24.9, adult: Secondary | ICD-10-CM | POA: Diagnosis not present

## 2022-09-22 DIAGNOSIS — C3411 Malignant neoplasm of upper lobe, right bronchus or lung: Secondary | ICD-10-CM | POA: Diagnosis not present

## 2022-09-22 DIAGNOSIS — C3432 Malignant neoplasm of lower lobe, left bronchus or lung: Secondary | ICD-10-CM | POA: Diagnosis not present

## 2022-09-22 DIAGNOSIS — C321 Malignant neoplasm of supraglottis: Secondary | ICD-10-CM | POA: Diagnosis not present

## 2022-09-26 DIAGNOSIS — Z79899 Other long term (current) drug therapy: Secondary | ICD-10-CM | POA: Diagnosis not present

## 2022-09-26 DIAGNOSIS — N184 Chronic kidney disease, stage 4 (severe): Secondary | ICD-10-CM | POA: Diagnosis not present

## 2022-09-27 DIAGNOSIS — K638219 Small intestinal bacterial overgrowth, unspecified: Secondary | ICD-10-CM | POA: Diagnosis not present

## 2022-09-27 DIAGNOSIS — K8681 Exocrine pancreatic insufficiency: Secondary | ICD-10-CM | POA: Diagnosis not present

## 2022-09-27 DIAGNOSIS — K861 Other chronic pancreatitis: Secondary | ICD-10-CM | POA: Diagnosis not present

## 2022-09-27 DIAGNOSIS — K580 Irritable bowel syndrome with diarrhea: Secondary | ICD-10-CM | POA: Diagnosis not present

## 2022-10-02 DIAGNOSIS — R197 Diarrhea, unspecified: Secondary | ICD-10-CM | POA: Diagnosis not present

## 2022-10-06 ENCOUNTER — Telehealth: Payer: Self-pay | Admitting: Hematology and Oncology

## 2022-10-09 ENCOUNTER — Ambulatory Visit: Payer: Medicare PPO

## 2022-10-09 ENCOUNTER — Ambulatory Visit
Admission: RE | Admit: 2022-10-09 | Discharge: 2022-10-09 | Disposition: A | Discharge status: Home / Self Care | Payer: Medicare PPO | Source: Ambulatory Visit | Attending: Radiation Oncology | Admitting: Radiation Oncology

## 2022-10-09 ENCOUNTER — Other Ambulatory Visit: Payer: Self-pay

## 2022-10-09 DIAGNOSIS — Z940 Kidney transplant status: Secondary | ICD-10-CM | POA: Diagnosis not present

## 2022-10-09 DIAGNOSIS — C3411 Malignant neoplasm of upper lobe, right bronchus or lung: Secondary | ICD-10-CM | POA: Diagnosis not present

## 2022-10-09 DIAGNOSIS — N184 Chronic kidney disease, stage 4 (severe): Secondary | ICD-10-CM | POA: Insufficient documentation

## 2022-10-09 DIAGNOSIS — Z79899 Other long term (current) drug therapy: Secondary | ICD-10-CM | POA: Insufficient documentation

## 2022-10-09 DIAGNOSIS — R911 Solitary pulmonary nodule: Secondary | ICD-10-CM | POA: Diagnosis not present

## 2022-10-09 LAB — RAD ONC ARIA SESSION SUMMARY
Course Elapsed Days: 0
Plan Fractions Treated to Date: 1
Plan Prescribed Dose Per Fraction: 18 Gy
Plan Total Fractions Prescribed: 3
Plan Total Prescribed Dose: 54 Gy
Reference Point Dosage Given to Date: 18 Gy
Reference Point Session Dosage Given: 18 Gy
Session Number: 1

## 2022-10-10 ENCOUNTER — Ambulatory Visit: Payer: Medicare PPO | Admitting: Radiation Oncology

## 2022-10-11 ENCOUNTER — Inpatient Hospital Stay (HOSPITAL_BASED_OUTPATIENT_CLINIC_OR_DEPARTMENT_OTHER): Payer: Medicare PPO | Admitting: Hematology and Oncology

## 2022-10-11 ENCOUNTER — Ambulatory Visit
Admission: RE | Admit: 2022-10-11 | Discharge: 2022-10-11 | Disposition: A | Discharge status: Home / Self Care | Payer: Medicare PPO | Source: Ambulatory Visit | Attending: Radiation Oncology | Admitting: Radiation Oncology

## 2022-10-11 ENCOUNTER — Other Ambulatory Visit: Payer: Self-pay

## 2022-10-11 ENCOUNTER — Inpatient Hospital Stay: Payer: Medicare PPO

## 2022-10-11 VITALS — BP 154/74 | HR 73 | Temp 99.3°F | Resp 18 | Ht 59.0 in | Wt 123.2 lb

## 2022-10-11 DIAGNOSIS — Z8719 Personal history of other diseases of the digestive system: Secondary | ICD-10-CM | POA: Insufficient documentation

## 2022-10-11 DIAGNOSIS — E1122 Type 2 diabetes mellitus with diabetic chronic kidney disease: Secondary | ICD-10-CM | POA: Insufficient documentation

## 2022-10-11 DIAGNOSIS — I129 Hypertensive chronic kidney disease with stage 1 through stage 4 chronic kidney disease, or unspecified chronic kidney disease: Secondary | ICD-10-CM | POA: Insufficient documentation

## 2022-10-11 DIAGNOSIS — R918 Other nonspecific abnormal finding of lung field: Secondary | ICD-10-CM | POA: Insufficient documentation

## 2022-10-11 DIAGNOSIS — R5383 Other fatigue: Secondary | ICD-10-CM | POA: Insufficient documentation

## 2022-10-11 DIAGNOSIS — Z8521 Personal history of malignant neoplasm of larynx: Secondary | ICD-10-CM | POA: Insufficient documentation

## 2022-10-11 DIAGNOSIS — Z888 Allergy status to other drugs, medicaments and biological substances status: Secondary | ICD-10-CM | POA: Insufficient documentation

## 2022-10-11 DIAGNOSIS — R92333 Mammographic heterogeneous density, bilateral breasts: Secondary | ICD-10-CM | POA: Insufficient documentation

## 2022-10-11 DIAGNOSIS — Z87442 Personal history of urinary calculi: Secondary | ICD-10-CM | POA: Insufficient documentation

## 2022-10-11 DIAGNOSIS — Z923 Personal history of irradiation: Secondary | ICD-10-CM | POA: Insufficient documentation

## 2022-10-11 DIAGNOSIS — Z79899 Other long term (current) drug therapy: Secondary | ICD-10-CM | POA: Diagnosis not present

## 2022-10-11 DIAGNOSIS — Z853 Personal history of malignant neoplasm of breast: Secondary | ICD-10-CM | POA: Insufficient documentation

## 2022-10-11 DIAGNOSIS — Z90710 Acquired absence of both cervix and uterus: Secondary | ICD-10-CM | POA: Insufficient documentation

## 2022-10-11 DIAGNOSIS — Z83719 Family history of colon polyps, unspecified: Secondary | ICD-10-CM | POA: Insufficient documentation

## 2022-10-11 DIAGNOSIS — N184 Chronic kidney disease, stage 4 (severe): Secondary | ICD-10-CM | POA: Diagnosis not present

## 2022-10-11 DIAGNOSIS — Z9049 Acquired absence of other specified parts of digestive tract: Secondary | ICD-10-CM | POA: Insufficient documentation

## 2022-10-11 DIAGNOSIS — Z833 Family history of diabetes mellitus: Secondary | ICD-10-CM | POA: Insufficient documentation

## 2022-10-11 DIAGNOSIS — D649 Anemia, unspecified: Secondary | ICD-10-CM | POA: Insufficient documentation

## 2022-10-11 DIAGNOSIS — C3411 Malignant neoplasm of upper lobe, right bronchus or lung: Secondary | ICD-10-CM | POA: Diagnosis not present

## 2022-10-11 DIAGNOSIS — Z880 Allergy status to penicillin: Secondary | ICD-10-CM | POA: Insufficient documentation

## 2022-10-11 DIAGNOSIS — Z818 Family history of other mental and behavioral disorders: Secondary | ICD-10-CM | POA: Insufficient documentation

## 2022-10-11 DIAGNOSIS — R911 Solitary pulmonary nodule: Secondary | ICD-10-CM | POA: Diagnosis not present

## 2022-10-11 DIAGNOSIS — Z87891 Personal history of nicotine dependence: Secondary | ICD-10-CM | POA: Insufficient documentation

## 2022-10-11 DIAGNOSIS — Z808 Family history of malignant neoplasm of other organs or systems: Secondary | ICD-10-CM | POA: Insufficient documentation

## 2022-10-11 DIAGNOSIS — Z940 Kidney transplant status: Secondary | ICD-10-CM | POA: Diagnosis not present

## 2022-10-11 LAB — RAD ONC ARIA SESSION SUMMARY
Course Elapsed Days: 2
Plan Fractions Treated to Date: 2
Plan Prescribed Dose Per Fraction: 18 Gy
Plan Total Fractions Prescribed: 3
Plan Total Prescribed Dose: 54 Gy
Reference Point Dosage Given to Date: 36 Gy
Reference Point Session Dosage Given: 18 Gy
Session Number: 2

## 2022-10-11 LAB — CBC WITH DIFFERENTIAL/PLATELET
Abs Immature Granulocytes: 0.05 10*3/uL (ref 0.00–0.07)
Basophils Absolute: 0 10*3/uL (ref 0.0–0.1)
Basophils Relative: 0 %
Eosinophils Absolute: 0 10*3/uL (ref 0.0–0.5)
Eosinophils Relative: 1 %
HCT: 24.8 % — ABNORMAL LOW (ref 36.0–46.0)
Hemoglobin: 8.3 g/dL — ABNORMAL LOW (ref 12.0–15.0)
Immature Granulocytes: 1 %
Lymphocytes Relative: 16 %
Lymphs Abs: 0.9 10*3/uL (ref 0.7–4.0)
MCH: 35.5 pg — ABNORMAL HIGH (ref 26.0–34.0)
MCHC: 33.5 g/dL (ref 30.0–36.0)
MCV: 106 fL — ABNORMAL HIGH (ref 80.0–100.0)
Monocytes Absolute: 0.3 10*3/uL (ref 0.1–1.0)
Monocytes Relative: 5 %
Neutro Abs: 4.2 10*3/uL (ref 1.7–7.7)
Neutrophils Relative %: 77 %
Platelets: 137 10*3/uL — ABNORMAL LOW (ref 150–400)
RBC: 2.34 MIL/uL — ABNORMAL LOW (ref 3.87–5.11)
RDW: 13.7 % (ref 11.5–15.5)
WBC: 5.5 10*3/uL (ref 4.0–10.5)
nRBC: 0 % (ref 0.0–0.2)

## 2022-10-11 LAB — COMPREHENSIVE METABOLIC PANEL
ALT: 12 U/L (ref 0–44)
AST: 16 U/L (ref 15–41)
Albumin: 3.7 g/dL (ref 3.5–5.0)
Alkaline Phosphatase: 49 U/L (ref 38–126)
Anion gap: 7 (ref 5–15)
BUN: 71 mg/dL — ABNORMAL HIGH (ref 8–23)
CO2: 27 mmol/L (ref 22–32)
Calcium: 8.3 mg/dL — ABNORMAL LOW (ref 8.9–10.3)
Chloride: 107 mmol/L (ref 98–111)
Creatinine, Ser: 2.45 mg/dL — ABNORMAL HIGH (ref 0.44–1.00)
GFR, Estimated: 19 mL/min — ABNORMAL LOW (ref >60)
Glucose, Bld: 238 mg/dL — ABNORMAL HIGH (ref 70–99)
Potassium: 4 mmol/L (ref 3.5–5.1)
Sodium: 141 mmol/L (ref 135–145)
Total Bilirubin: 0.3 mg/dL (ref 0.3–1.2)
Total Protein: 6.1 g/dL — ABNORMAL LOW (ref 6.5–8.1)

## 2022-10-11 NOTE — Progress Notes (Signed)
Anita Pollard CONSULT NOTE  Patient Care Team: Anita Floro, MD as PCP - General (Family Medicine) Anita Bathe, MD as PCP - Cardiology (Cardiology) Anita Floro, MD as Attending Physician (Family Medicine) Anita Peak, MD as Consulting Physician (Radiation Oncology) Malmfelt, Anita Auer, RN as Oncology Nurse Navigator Anita Bathe, MD as Consulting Physician (Cardiology)  CHIEF COMPLAINTS/PURPOSE OF CONSULTATION:  Normocytic normochromic anemia   ASSESSMENT & PLAN:   This is a very pleasant 82 year old female patient with past medical history significant for diabetes, hypertension, breast cancer, malignant neoplasm of supraglottis, 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 They were ok with epo if there is contraindication such as solid tumors/active disease. Her most recent CT scan shows mild increase in the lung nodule, she is now undergoing radiation for this lung nodule, she will complete radiation next week.   Once there is no evidence of active malignancy, I believe she may be a candidate for EPO especially with her CKD stage IV and ongoing fatigue with severe anemia.  She will do a CBC today.  No concerns on physical exam except for chronic venous stasis changes and easy bruising.  Return to clinic in early September with repeat labs. All her questions were answered to the best my knowledge.  She can return to clinic as scheduled in third week of May.  HISTORY OF PRESENTING ILLNESS:   Anita Pollard 82 y.o. female is here because of anemia.  This is a very pleasant 82 year old female patient with past medical history significant for right-sided breast cancer, cancer of the supraglottis status postradiation, type 2 diabetes, hypertension, kidney transplant referred to hematology  for evaluation of slowly progressive anemia. She also has lung nodules which were concerning for primary lung cancer, received radiation in 2022 and is now back on SBRT for right upper lobe lung nodule.  She is going through this right now.  She complains of ongoing fatigue, not definitely worse compared to her last visit.  She also is followed by [facility] transplant team and has stage IV chronic kidney disease. Since her last visit here, she denies any bleeding issues.  She has gone to the beach for some time and had a good time.  Rest of the pertinent 10 point ROS reviewed and negative  MEDICAL HISTORY:  Past Medical History:  Diagnosis Date   Alcohol abuse    Anemia    Arthritis    Breast cancer (HCC) 2010   Right   Chronic kidney disease    Colon polyp    Depression    Diabetes mellitus    GERD (gastroesophageal reflux disease)    Heart murmur    History of kidney stones 1976   HTN (hypertension)    Hypothyroidism    Osteopenia    Pancreatitis    Ulcerative esophagitis     SURGICAL HISTORY: Past Surgical History:  Procedure Laterality Date   ABDOMINAL HYSTERECTOMY     ABDOMINOPLASTY     APPENDECTOMY     BREAST BIOPSY Right 08/03/2008   Stereo Bx, Malignant   BREAST LUMPECTOMY Right 08/26/2008   CATARACT EXTRACTION W/ INTRAOCULAR LENS  IMPLANT, BILATERAL     CO2 LASER APPLICATION N/A 01/09/2020   Procedure: CO2 LASER APPLICATION;  Surgeon: Anita Reading, MD;  Location: [facility] OR;  Service: ENT;  Laterality: N/A;   COSMETIC SURGERY     ductal carcinoma     right breast    EYE SURGERY     HIP ARTHROPLASTY Right    INSERTION OF DIALYSIS CATHETER Right 09/25/2012   Procedure: INSERTION OF DIALYSIS CATHETER;  Surgeon: Pryor Ochoa, MD;  Location: [facility] OR;  Service: Vascular;  Laterality: Right;  Righ Internal Jugular Placement   IR GASTROSTOMY TUBE MOD SED  02/10/2020   IR GASTROSTOMY TUBE REMOVAL  05/25/2020   JOINT REPLACEMENT     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    MICROLARYNGOSCOPY N/A 10/20/2019   Procedure: Suspended Microlaryngoscopy with Biopsy;  Surgeon: Anita Reading, MD;  Location: [facility] OR;  Service: ENT;  Laterality: N/A;   MICROLARYNGOSCOPY N/A 01/09/2020   Procedure: MICROLARYNGOSCOPY WITH BIOPSY;  Surgeon: Anita Reading, MD;  Location: [facility] OR;  Service: ENT;  Laterality: N/A;   RIGID ESOPHAGOSCOPY N/A 10/20/2019   Procedure: RIGID ESOPHAGOSCOPY;  Surgeon: Anita Reading, MD;  Location: [facility] OR;  Service: ENT;  Laterality: N/A;   THYROIDECTOMY     TUMMY TUCK      SOCIAL HISTORY: Social History   Socioeconomic History   Marital status: Widowed    Spouse name: Not on file   Number of children: Not on file   Years of education: Not on file   Highest education level: Not on file  Occupational History   Not on file  Tobacco Use   Smoking status: Former    Current packs/day: 0.00    Types: Cigarettes    Quit date: 09/25/1983    Years since quitting: 39.0   Smokeless tobacco: Never  Vaping Use   Vaping status: Never Used  Substance and Sexual Activity   Alcohol use: Yes    Comment: socially history of heavy drinking with intervention x 2-at least 2 occasions since april '14   Drug use: No   Sexual activity: Never  Other Topics Concern   Not on file  Social History Narrative   Not on file   Social Determinants of Health   Financial Resource Strain: Low Risk  (01/22/2020)   Overall Financial Resource Strain (CARDIA)    Difficulty of Paying Living Expenses: Not hard at all  Food Insecurity: No Food Insecurity (06/30/2022)   Hunger Vital Sign    Worried About Running Out of Food in the Last Year: Never true    Ran Out of Food in the Last Year: Never true  Transportation Needs: No Transportation Needs (06/30/2022)   PRAPARE - Administrator, Civil Service (Medical): No    Lack of Transportation (Non-Medical): No  Physical Activity: Insufficiently Active (01/22/2020)   Exercise Vital Sign    Days of Exercise per Week: 5 days     Minutes of Exercise per Session: 20 min  Stress: Not on file  Social Connections: Moderately Isolated (01/22/2020)   Social Connection and Isolation Panel [NHANES]    Frequency of Communication with Friends and Family: More than three times a week    Frequency of Social Gatherings with Friends and Family: Never    Attends Religious Services: Never    Database administrator or Organizations: Yes    Attends Banker Meetings: Never    Marital Status: Widowed  Intimate Partner Violence: Not At Risk (06/30/2022)   Humiliation, Afraid, Rape, and Kick questionnaire    Fear of Current or Ex-Partner: No    Emotionally Abused: No    Physically Abused: No    Sexually  Abused: No    FAMILY HISTORY: Family History  Problem Relation Age of Onset   Hypertension Mother    Diabetes Father    Hypertension Father    Hypertension Sister    Dementia Sister    Multiple myeloma Sister    Thyroid cancer Daughter 35   Diabetes Brother    Colon polyps Brother    Atrial fibrillation Brother    Breast cancer Neg Hx     ALLERGIES:  is allergic to penicillins, banana, detrol [tolterodine], quetiapine, risperidone and related, and lisinopril.  MEDICATIONS:  Current Outpatient Medications  Medication Sig Dispense Refill   carvedilol (COREG) 12.5 MG tablet Take 25 mg by mouth 2 (two) times daily.     Cholecalciferol (VITAMIN D3) 5000 UNITS TABS Take 5,000 Units by mouth 3 (three) times daily after meals.      CREON 3000-9500 units CPEP Take 3 capsules by mouth in the morning, at noon, and at bedtime.     ferrous sulfate 324 MG TBEC Take 324 mg by mouth 2 (two) times daily. Takes     glipiZIDE (GLUCOTROL XL) 2.5 MG 24 hr tablet Take 5 mg by mouth 2 (two) times daily.      insulin glargine, 1 Unit Dial, (TOUJEO SOLOSTAR) 300 UNIT/ML Solostar Pen Inject 5-7 Units into the skin daily.     Lancets (FREESTYLE) lancets by Does not apply route.     levothyroxine (SYNTHROID) 175 MCG tablet Take 175  mcg by mouth daily before breakfast. 6 days a week, does not take on sundays     Melatonin 5 MG CAPS Take 5 mg by mouth See admin instructions. Take 5 mg 2 hours before bedtime     Multiple Vitamins-Minerals (ICAPS AREDS 2 PO) Take 1 tablet by mouth 2 (two) times daily.     omeprazole (PRILOSEC) 20 MG capsule Take 20 mg by mouth daily.     Polyethyl Glycol-Propyl Glycol (SYSTANE OP) Place 1 drop into both eyes every evening.     potassium chloride (MICRO-K) 10 MEQ CR capsule Take 20 mEq by mouth 2 (two) times daily. 20mg  in the am 10mg  in the afternoon and 10mg  at night     pramipexole (MIRAPEX) 0.25 MG tablet Take 0.25-0.5 mg by mouth See admin instructions. Take 0.5 mg 2 hours prior to bedtime then 0.25 mg at bedtime     predniSONE (DELTASONE) 10 MG tablet Take 5 mg by mouth daily with breakfast.     rosuvastatin (CRESTOR) 5 MG tablet Take 1 tablet (5 mg total) by mouth 2 (two) times a week. Take on Mondays and Thursdays. 30 tablet 3   sulfamethoxazole-trimethoprim (BACTRIM) 400-80 MG tablet Take 1 tablet by mouth 3 (three) times a week. Monday Wednesday friday     tacrolimus (PROGRAF) 0.5 MG capsule Take 2 mg by mouth every 12 (twelve) hours.     temazepam (RESTORIL) 15 MG capsule Take 15-30 mg by mouth at bedtime.      torsemide (DEMADEX) 20 MG tablet Take 3 tablets (60 mg total) by mouth daily. 90 tablet 0   vitamin B-12 (CYANOCOBALAMIN) 500 MCG tablet Take 500 mcg by mouth daily.     No current facility-administered medications for this visit.   PHYSICAL EXAMINATION:  ECOG PERFORMANCE STATUS: 0 - Asymptomatic  Vitals:   10/11/22 1508  BP: (!) 154/74  Pulse: 73  Resp: 18  Temp: 99.3 F (37.4 C)  SpO2: 95%    Physical Exam Constitutional:      Appearance: Normal appearance.  Cardiovascular:     Rate and Rhythm: Normal rate and regular rhythm.     Pulses: Normal pulses.     Heart sounds: Normal heart sounds.  Pulmonary:     Effort: Pulmonary effort is normal.     Breath  sounds: Normal breath sounds.  Abdominal:     General: Abdomen is flat.     Palpations: Abdomen is soft.  Musculoskeletal:        General: Swelling (Venous stasis changes with bilateral lower extremity swelling, no change) present.     Cervical back: Normal range of motion. No rigidity.  Lymphadenopathy:     Cervical: No cervical adenopathy.  Skin:    General: Skin is warm and dry.  Neurological:     General: No focal deficit present.     Mental Status: She is alert.      LABORATORY DATA:  I have reviewed the data as listed Lab Results  Component Value Date   WBC 3.3 (L) 07/02/2022   HGB 9.0 (L) 07/02/2022   HCT 27.7 (L) 07/02/2022   MCV 104.9 (H) 07/02/2022   PLT 135 (L) 07/02/2022     Chemistry      Component Value Date/Time   NA 141 07/17/2022 1524   NA 140 08/28/2013 0955   K 4.2 07/17/2022 1524   K 3.0 (LL) 08/28/2013 0955   CL 106 07/17/2022 1524   CL 111 (H) 09/03/2012 1249   CO2 20 07/17/2022 1524   CO2 16 (L) 08/28/2013 0955   BUN 57 (H) 07/17/2022 1524   BUN 75.7 (H) 08/28/2013 0955   CREATININE 2.08 (H) 07/17/2022 1524   CREATININE 1.67 (H) 09/28/2020 1432   CREATININE 5.9 (HH) 08/28/2013 0955      Component Value Date/Time   CALCIUM 8.6 (L) 07/17/2022 1524   CALCIUM 7.5 (L) 08/28/2013 0955   ALKPHOS 72 06/29/2022 1953   ALKPHOS 62 08/28/2013 0955   AST 26 06/29/2022 1953   AST 15 09/28/2020 1432   AST 15 08/28/2013 0955   ALT 25 06/29/2022 1953   ALT 15 09/28/2020 1432   ALT 17 08/28/2013 0955   BILITOT 0.7 06/29/2022 1953   BILITOT 0.3 09/28/2020 1432   BILITOT 0.38 08/28/2013 0955     Reviewed labs from [facility] done on December 14.  Her white blood cell count of 5.7, hemoglobin is 10.3, platelet count of 210,000, absolute lymphocytes were low and absolute eosinophils were low, otherwise iron panel showed a ferritin of 46 and total iron binding capacity of 326, iron saturation of 25%.   07/21/2021, CBC showed white blood cell count of 6000,  hemoglobin of 10, hematocrit of 30.2 and platelet count of 187,000. Iron and iron binding panel normal.  B12 is normal.  01/19/2022 CBC showed hemoglobin of 9.2 g/dL, total white blood cell count of 5.6 and platelet count of 183,000.  Iron panel with increasing total iron binding capacity suggestive of iron deficiency, ferritin pending  I spoke with Dr Gardiner Ramus and he suggested she has CKD 4, ferritin 33. I couldn't see the labs in care everywhere.  We will do CBC today.  RADIOGRAPHIC STUDIES: I have personally reviewed the radiological images as listed and agreed with the findings in the report. MM 3D SCREENING MAMMOGRAM BILATERAL BREAST  Result Date: 09/18/2022 CLINICAL DATA:  Screening. EXAM: DIGITAL SCREENING BILATERAL MAMMOGRAM WITH TOMOSYNTHESIS AND CAD TECHNIQUE: Bilateral screening digital craniocaudal and mediolateral oblique mammograms were obtained. Bilateral screening digital breast tomosynthesis was performed. The images were evaluated with computer-aided  detection. COMPARISON:  Previous exam(s). ACR Breast Density Category c: The breasts are heterogeneously dense, which may obscure small masses. FINDINGS: There are no findings suspicious for malignancy. IMPRESSION: No mammographic evidence of malignancy. A result letter of this screening mammogram will be mailed directly to the patient. RECOMMENDATION: Screening mammogram in one year. (Code:SM-B-01Y) BI-RADS CATEGORY  1: Negative. Electronically Signed   By: Sande Brothers M.D.   On: 09/18/2022 11:15    Total time spent: 30 minutes including history, review of recent records, labs, counseling and coordination of care     Rachel Moulds, MD 10/11/2022 3:33 PM

## 2022-10-12 ENCOUNTER — Ambulatory Visit: Payer: Medicare PPO | Admitting: Radiation Oncology

## 2022-10-12 LAB — TSH: TSH: 1.779 u[IU]/mL (ref 0.350–4.500)

## 2022-10-13 ENCOUNTER — Ambulatory Visit: Payer: Medicare PPO | Admitting: Radiation Oncology

## 2022-10-15 ENCOUNTER — Encounter: Payer: Self-pay | Admitting: Physician Assistant

## 2022-10-15 NOTE — Progress Notes (Unsigned)
Cardiology Office Note    Date:  10/16/2022  ID:  Anita Pollard, DOB [birth year], MRN [id number] PCP:  [name], MD  Cardiologist:  Donato Schultz, MD  Electrophysiologist:  None   Chief Complaint: f/u CHF  History of Present Illness: .    Anita Pollard is a 82 y.o. 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 CAD mentioned in prior note without further details but coronary calcifications are noted on prior imaging. She had a negative stress echo at [facility] in 2015. Last echo 06/2022 showed EF 70-75%, G1DD, elevated LAP, normal RV, aortic sclerosis without stenosis. She was last seen 06/2022 following admission for CHF. She is also noted to have LE edema in part felt due to venous insufficiency and side effect from amlodipine.   She is seen for follow-up and denies any recent progressive cardiac symptoms. She is still undergoing radiation. She has good days and bad days. She reports her kidney doctor has recently changed over from Dr. Gardiner Ramus to Dr. Rod Mae. She had bloodwork at the cancer center last week and Cr was 2.45. It was 2.25 in early July, with general trends ranging from 2-2.3 earlier this year.   Labwork independently reviewed: 09/2022 TSH ok, Cr 2.45 slightly up from prior, K 4.0, alb 3.7, AST AL T OK, Hgb 8.3 variable done by heme-onc [facility] 04/2023 LDL 79, trig 156, HDL 67 - managed by PCP     ROS: .    Please see the history of present illness.  All other systems are reviewed and otherwise negative.   Studies  Reviewed: Marland Kitchen    EKG:  EKG is not ordered today but reviewed hospital EKG with NSR/SB 55bpm baseline wander but no acute changes  CV Studies: Cardiac studies reviewed are outlined and summarized above. Otherwise please see EMR for full report.  Physical Exam:    VS:  BP 130/64   Pulse 73   Ht 4\' 11"  (1.499 m)   Wt 118 lb 6.4 oz (53.7 kg)   SpO2 95%   BMI 23.91 kg/m    Wt Readings from Last 3 Encounters:  10/16/22 118 lb 6.4 oz (53.7 kg)  10/11/22 123 lb 3.2 oz (55.9 kg)  09/15/22 123 lb 12.8 oz (56.2 kg)    GEN: Well nourished, well developed in no acute distress NECK: No JVD; No carotid bruits CARDIAC: RRR, no murmurs, rubs, gallops RESPIRATORY:  Clear to auscultation without rales, wheezing or rhonchi  ABDOMEN: Soft, non-tender, non-distended EXTREMITIES:  Trivial mild puffiness of BLE without any significant pitting edema; No acute deformity   Asessement and Plan:.    1. Chronic HFpEF - appears euvolemic on exam. Recent labs are outlined above. See #2 regarding renal function. No med changes made today.  2. CKD stage IV - this is managed by nephrology and transplant team. She had not yet heard back from heme-onc regarding labs done last week. Cr was noted to uptrend somewhat from 2.25 to 2.45. Her recent trends in 2024 have been around 2-2.3 otherwise. Since labs were just recently drawn, I have recommended she follow up closely with her  kidney doctor. Our CMA will call their office to have them review her bloodwork. Otherwise she remains clinically stable from cardiac standpoint.  3. Coronary calcifications seen on CT - no recent anginal symptoms. No indication for further ischemic testing. Maintained on rosuvastatin twice weekly, lipids followed by primary care. If she were to require increase in dose in the future would consider switch to atorvastatin given renal disease but will defer to PCP on this. With CrCl<30, max dose is 10mg  daily and she is only on 5mg  twice weekly. With  issues with anemia and lack of obstructive CAD, would not start aspirin prophylactically.  4. Carotid artery disease - this appears relatively stable over many years' time, last checked in 2022 by duplex at 1-39% bilaterally. Could consider another study at the 3 year mark but will defer to Dr. Anne Fu in follow-up. No recent TIA/CVA symptoms. Lipids are managed by PCP as above.     Disposition: F/u 6 months (Jan 2025) with Dr. Anne Fu, sooner if needed.  Signed, Laurann Montana, PA-C

## 2022-10-16 ENCOUNTER — Encounter: Payer: Self-pay | Admitting: Physician Assistant

## 2022-10-16 ENCOUNTER — Ambulatory Visit: Payer: Medicare PPO | Admitting: Radiation Oncology

## 2022-10-16 ENCOUNTER — Ambulatory Visit: Payer: Medicare PPO | Attending: Physician Assistant | Admitting: Physician Assistant

## 2022-10-16 VITALS — BP 130/64 | HR 73 | Ht 59.0 in | Wt 118.4 lb

## 2022-10-16 DIAGNOSIS — I5032 Chronic diastolic (congestive) heart failure: Secondary | ICD-10-CM

## 2022-10-16 DIAGNOSIS — I6523 Occlusion and stenosis of bilateral carotid arteries: Secondary | ICD-10-CM

## 2022-10-16 DIAGNOSIS — I2584 Coronary atherosclerosis due to calcified coronary lesion: Secondary | ICD-10-CM

## 2022-10-16 DIAGNOSIS — N184 Chronic kidney disease, stage 4 (severe): Secondary | ICD-10-CM

## 2022-10-16 DIAGNOSIS — I2510 Atherosclerotic heart disease of native coronary artery without angina pectoris: Secondary | ICD-10-CM

## 2022-10-16 NOTE — Patient Instructions (Addendum)
Medication Instructions:  Your physician recommends that you continue on your current medications as directed. Please refer to the Current Medication list given to you today.  *If you need a refill on your cardiac medications before your next appointment, please call your pharmacy*   Lab Work: None  If you have labs (blood work) drawn today and your tests are completely normal, you will receive your results only by: MyChart Message (if you have MyChart) OR A paper copy in the mail If you have any lab test that is abnormal or we need to change your treatment, we will call you to review the results.   Testing/Procedures: None   Follow-Up: At [facility], you and your health needs are our priority.  As part of our continuing mission to provide you with exceptional heart care, we have created designated Provider Care Teams.  These Care Teams include your primary Cardiologist (physician) and Advanced Practice Providers (APPs -  Physician Assistants and Nurse Practitioners) who all work together to provide you with the care you need, when you need it.  We recommend signing up for the patient portal called "MyChart".  Sign up information is provided on this After Visit Summary.  MyChart is used to connect with patients for Virtual Visits (Telemedicine).  Patients are able to view lab/test results, encounter notes, upcoming appointments, etc.  Non-urgent messages can be sent to your provider as well.   To learn more about what you can do with MyChart, go to [email or web address].    Your next appointment:   6 month(s)  Provider:   Donato Schultz, MD

## 2022-10-17 ENCOUNTER — Ambulatory Visit
Admission: RE | Admit: 2022-10-17 | Discharge: 2022-10-17 | Disposition: A | Discharge status: Home / Self Care | Payer: Medicare PPO | Source: Ambulatory Visit | Attending: Radiation Oncology | Admitting: Radiation Oncology

## 2022-10-17 ENCOUNTER — Ambulatory Visit: Admission: RE | Admit: 2022-10-17 | Payer: Medicare PPO | Source: Ambulatory Visit

## 2022-10-17 ENCOUNTER — Other Ambulatory Visit: Payer: Self-pay

## 2022-10-17 ENCOUNTER — Ambulatory Visit: Payer: Medicare PPO | Admitting: Radiation Oncology

## 2022-10-17 DIAGNOSIS — R911 Solitary pulmonary nodule: Secondary | ICD-10-CM | POA: Diagnosis not present

## 2022-10-17 DIAGNOSIS — Z510 Encounter for antineoplastic radiation therapy: Secondary | ICD-10-CM | POA: Diagnosis not present

## 2022-10-17 DIAGNOSIS — C3432 Malignant neoplasm of lower lobe, left bronchus or lung: Secondary | ICD-10-CM | POA: Diagnosis not present

## 2022-10-17 DIAGNOSIS — Z940 Kidney transplant status: Secondary | ICD-10-CM | POA: Diagnosis not present

## 2022-10-17 DIAGNOSIS — N184 Chronic kidney disease, stage 4 (severe): Secondary | ICD-10-CM | POA: Diagnosis not present

## 2022-10-17 DIAGNOSIS — C321 Malignant neoplasm of supraglottis: Secondary | ICD-10-CM | POA: Diagnosis not present

## 2022-10-17 DIAGNOSIS — Z79899 Other long term (current) drug therapy: Secondary | ICD-10-CM | POA: Diagnosis not present

## 2022-10-17 DIAGNOSIS — C3411 Malignant neoplasm of upper lobe, right bronchus or lung: Secondary | ICD-10-CM | POA: Diagnosis not present

## 2022-10-17 LAB — RAD ONC ARIA SESSION SUMMARY
Course Elapsed Days: 8
Plan Fractions Treated to Date: 3
Plan Prescribed Dose Per Fraction: 18 Gy
Plan Total Fractions Prescribed: 3
Plan Total Prescribed Dose: 54 Gy
Reference Point Dosage Given to Date: 54 Gy
Reference Point Session Dosage Given: 18 Gy
Session Number: 3

## 2022-10-18 NOTE — Radiation Completion Notes (Signed)
Patient Name: Anita Pollard, Anita Pollard MRN: [id number] Date of Birth: [birth year] Referring Physician: Christia Reading, M.D. Date of Service: 2022-10-18 Radiation Oncologist: Lonie Peak, M.D. 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, right bronchus or lung Staging on 2020-01-20: Malignant neoplasm of supraglottis (HCC) T=cT2, N=cN0, M=cM0 Intent: Curative     ==========DELIVERED PLANS==========  First Treatment Date: 2022-10-09 - Last Treatment Date: 2022-10-17   Plan Name: Lung_RUL_SBRT Site: Lung, Right Technique: SBRT/SRT-IMRT Mode: Photon Dose Per Fraction: 18 Gy Prescribed Dose (Delivered / Prescribed): 54 Gy / 54 Gy Prescribed Fxs (Delivered / Prescribed): 3 / 3     ==========ON TREATMENT VISIT DATES========== 2022-10-09, 2022-10-11, 2022-10-17, 2022-10-17     ==========UPCOMING VISITS==========       ==========APPENDIX - ON TREATMENT VISIT NOTES==========   See weekly On Treatment Notes in Epic for details.

## 2022-10-19 ENCOUNTER — Ambulatory Visit: Payer: Medicare PPO | Admitting: Radiation Oncology

## 2022-10-19 ENCOUNTER — Telehealth: Payer: Self-pay | Admitting: Cardiology

## 2022-10-19 ENCOUNTER — Ambulatory Visit: Payer: Medicare PPO

## 2022-10-19 DIAGNOSIS — E118 Type 2 diabetes mellitus with unspecified complications: Secondary | ICD-10-CM | POA: Diagnosis not present

## 2022-10-19 DIAGNOSIS — J439 Emphysema, unspecified: Secondary | ICD-10-CM | POA: Diagnosis not present

## 2022-10-19 DIAGNOSIS — Z940 Kidney transplant status: Secondary | ICD-10-CM | POA: Diagnosis not present

## 2022-10-19 DIAGNOSIS — N184 Chronic kidney disease, stage 4 (severe): Secondary | ICD-10-CM | POA: Diagnosis not present

## 2022-10-19 DIAGNOSIS — I10 Essential (primary) hypertension: Secondary | ICD-10-CM | POA: Diagnosis not present

## 2022-10-19 DIAGNOSIS — E890 Postprocedural hypothyroidism: Secondary | ICD-10-CM | POA: Diagnosis not present

## 2022-10-19 DIAGNOSIS — Z85118 Personal history of other malignant neoplasm of bronchus and lung: Secondary | ICD-10-CM | POA: Diagnosis not present

## 2022-10-19 NOTE — Telephone Encounter (Signed)
Patient's Nephrologist, Dr. Rod Mae, is calling stating she received call in regards to labs. Unsure as to who left message. Requesting return call back.

## 2022-10-20 NOTE — Telephone Encounter (Signed)
Attempted to contact Dr Rod Mae.  Left message on voicemail recent lab from 10/11/22 are being faxed to her for her review. Lab routed electronically.  Requested she call back if any questions or concerns.

## 2022-10-23 NOTE — Telephone Encounter (Signed)
No return call.  Will close this encounter and await return call if further needs.

## 2022-11-09 NOTE — Progress Notes (Signed)
Ms. Lascola was called today for follow up after completion of radiation treatment to her lung. She completed treatment on 10-17-22.   PAIN: No, pt denies pain at this time  RESPIRATORY: Coughing  Productive. Pt is on room air.Pt feels like her cough is coming from fluid that is around her epiglottis at this time. She has seen Dr. Jenne Pane for this.  Pt reports normal skin with no concerns.   SWALLOWING/DIET: Pt  is eating a regular diet well. She does report having an ongoing issues taking larger pills. She has had this issue even before this last round of radiation.   OTHER: Pt denies weakness or fatigue. She reports having to take medication to help her sleep at baseline. Pt has no major concerns or questions and is doing well overall. She was appreciative of the follow up phone call.    There were no vitals taken for this visit.   Wt Readings from Last 3 Encounters:  10/16/22 118 lb 6.4 oz (53.7 kg)  10/11/22 123 lb 3.2 oz (55.9 kg)  09/15/22 123 lb 12.8 oz (56.2 kg)

## 2022-11-20 DIAGNOSIS — Z6824 Body mass index (BMI) 24.0-24.9, adult: Secondary | ICD-10-CM | POA: Diagnosis not present

## 2022-11-20 DIAGNOSIS — E1165 Type 2 diabetes mellitus with hyperglycemia: Secondary | ICD-10-CM | POA: Diagnosis not present

## 2022-11-20 DIAGNOSIS — T148XXA Other injury of unspecified body region, initial encounter: Secondary | ICD-10-CM | POA: Diagnosis not present

## 2022-11-20 DIAGNOSIS — E1142 Type 2 diabetes mellitus with diabetic polyneuropathy: Secondary | ICD-10-CM | POA: Diagnosis not present

## 2022-11-20 DIAGNOSIS — G4700 Insomnia, unspecified: Secondary | ICD-10-CM | POA: Diagnosis not present

## 2022-11-23 ENCOUNTER — Ambulatory Visit
Admission: RE | Admit: 2022-11-23 | Discharge: 2022-11-23 | Disposition: A | Discharge status: Home / Self Care | Payer: Medicare PPO | Source: Ambulatory Visit | Attending: Radiation Oncology | Admitting: Radiation Oncology

## 2022-11-23 ENCOUNTER — Telehealth: Payer: Self-pay

## 2022-11-23 NOTE — Telephone Encounter (Signed)
 RN called pt for telephone follow up. Note completed and routed to Dr. Basilio Cairo for review.

## 2022-11-28 DIAGNOSIS — Z7969 Long term (current) use of other immunomodulators and immunosuppressants: Secondary | ICD-10-CM | POA: Diagnosis not present

## 2022-11-28 DIAGNOSIS — N184 Chronic kidney disease, stage 4 (severe): Secondary | ICD-10-CM | POA: Diagnosis not present

## 2022-11-28 DIAGNOSIS — I129 Hypertensive chronic kidney disease with stage 1 through stage 4 chronic kidney disease, or unspecified chronic kidney disease: Secondary | ICD-10-CM | POA: Diagnosis not present

## 2022-11-28 DIAGNOSIS — I10 Essential (primary) hypertension: Secondary | ICD-10-CM | POA: Diagnosis not present

## 2022-11-28 DIAGNOSIS — Z940 Kidney transplant status: Secondary | ICD-10-CM | POA: Diagnosis not present

## 2022-11-28 DIAGNOSIS — E1169 Type 2 diabetes mellitus with other specified complication: Secondary | ICD-10-CM | POA: Diagnosis not present

## 2022-11-28 DIAGNOSIS — Z79899 Other long term (current) drug therapy: Secondary | ICD-10-CM | POA: Diagnosis not present

## 2022-11-28 DIAGNOSIS — E876 Hypokalemia: Secondary | ICD-10-CM | POA: Diagnosis not present

## 2022-11-29 ENCOUNTER — Telehealth: Payer: Self-pay

## 2022-11-29 NOTE — Telephone Encounter (Signed)
Pt called requesting to cancel a lab appt on 12/07/2022 due to her getting her labs drawn at a [facility] lab on [address] 12/05/2022. Lab appt is to be canceled on 12/07/22 at [facility] lab. Pt verbalized understanding.

## 2022-12-01 ENCOUNTER — Telehealth: Payer: Self-pay

## 2022-12-01 NOTE — Telephone Encounter (Signed)
RN left message for pt after she called and left message. Rn looked at phone notes and saw that med onc had tried to call her to let her know about cancelled labs. Rn left message with this information and call back information.

## 2022-12-04 ENCOUNTER — Telehealth: Payer: Self-pay

## 2022-12-04 NOTE — Telephone Encounter (Signed)
RN called pt to let her know that she is OK to proceed with receiving her lab draw off of [address] that she has scheduled for this week. This RN also let pt know that she only needs to attend that lab appointment this week and she does not need to have a second set of labs drawn for her visit with Dr. Al Pimple. Also reassured patient that [facility] will have the appropriate lab orders necessary in order to draw her labs. Pt verbalized understanding.

## 2022-12-04 NOTE — Telephone Encounter (Signed)
Rn called pt and talked to her about her labs to be drawn on Tuesday. She stated she wanted to make sure Dr. Al Pimple had the labs she needed as well (for her visit for Thursday). Rn spoke with Pearson Grippe RN who will call pt and let her know that this is taken care of for their part.

## 2022-12-05 ENCOUNTER — Telehealth: Payer: Self-pay

## 2022-12-05 ENCOUNTER — Other Ambulatory Visit: Payer: Self-pay

## 2022-12-05 DIAGNOSIS — D649 Anemia, unspecified: Secondary | ICD-10-CM

## 2022-12-05 DIAGNOSIS — N184 Chronic kidney disease, stage 4 (severe): Secondary | ICD-10-CM | POA: Diagnosis not present

## 2022-12-05 NOTE — Telephone Encounter (Signed)
Pt LVM regarding lab appointment that she had with [facility] off of [address]. This RN returned call and patient stated that they had "refused to draw her labs."  This RN states that she can get pt rescheduled here at [facility] with lab at 13:15 prior to her appt with Dr. Al Pimple. Pt agreed to come in for that time. Appointment scheduled.

## 2022-12-07 ENCOUNTER — Inpatient Hospital Stay: Payer: Medicare PPO

## 2022-12-07 ENCOUNTER — Inpatient Hospital Stay: Payer: Medicare PPO | Attending: Radiation Oncology | Admitting: Hematology and Oncology

## 2022-12-07 ENCOUNTER — Other Ambulatory Visit: Payer: Self-pay

## 2022-12-07 ENCOUNTER — Other Ambulatory Visit: Payer: Medicare PPO

## 2022-12-07 VITALS — BP 162/77 | HR 70 | Temp 97.6°F | Resp 17 | Wt 123.6 lb

## 2022-12-07 DIAGNOSIS — D631 Anemia in chronic kidney disease: Secondary | ICD-10-CM | POA: Diagnosis not present

## 2022-12-07 DIAGNOSIS — N184 Chronic kidney disease, stage 4 (severe): Secondary | ICD-10-CM | POA: Insufficient documentation

## 2022-12-07 DIAGNOSIS — C321 Malignant neoplasm of supraglottis: Secondary | ICD-10-CM

## 2022-12-07 DIAGNOSIS — C3432 Malignant neoplasm of lower lobe, left bronchus or lung: Secondary | ICD-10-CM | POA: Insufficient documentation

## 2022-12-07 DIAGNOSIS — Z880 Allergy status to penicillin: Secondary | ICD-10-CM | POA: Insufficient documentation

## 2022-12-07 DIAGNOSIS — Z888 Allergy status to other drugs, medicaments and biological substances status: Secondary | ICD-10-CM | POA: Insufficient documentation

## 2022-12-07 DIAGNOSIS — Z79899 Other long term (current) drug therapy: Secondary | ICD-10-CM | POA: Diagnosis not present

## 2022-12-07 DIAGNOSIS — D649 Anemia, unspecified: Secondary | ICD-10-CM

## 2022-12-07 DIAGNOSIS — Z923 Personal history of irradiation: Secondary | ICD-10-CM | POA: Diagnosis not present

## 2022-12-07 DIAGNOSIS — Z862 Personal history of diseases of the blood and blood-forming organs and certain disorders involving the immune mechanism: Secondary | ICD-10-CM

## 2022-12-07 DIAGNOSIS — N189 Chronic kidney disease, unspecified: Secondary | ICD-10-CM | POA: Diagnosis not present

## 2022-12-07 DIAGNOSIS — C3411 Malignant neoplasm of upper lobe, right bronchus or lung: Secondary | ICD-10-CM | POA: Diagnosis present

## 2022-12-07 LAB — CMP (CANCER CENTER ONLY)
ALT: 16 U/L (ref 0–44)
AST: 17 U/L (ref 15–41)
Albumin: 3.9 g/dL (ref 3.5–5.0)
Alkaline Phosphatase: 52 U/L (ref 38–126)
Anion gap: 6 (ref 5–15)
BUN: 60 mg/dL — ABNORMAL HIGH (ref 8–23)
CO2: 31 mmol/L (ref 22–32)
Calcium: 8.4 mg/dL — ABNORMAL LOW (ref 8.9–10.3)
Chloride: 105 mmol/L (ref 98–111)
Creatinine: 2.51 mg/dL — ABNORMAL HIGH (ref 0.44–1.00)
GFR, Estimated: 18 mL/min — ABNORMAL LOW (ref >60)
Glucose, Bld: 225 mg/dL — ABNORMAL HIGH (ref 70–99)
Potassium: 4.6 mmol/L (ref 3.5–5.1)
Sodium: 142 mmol/L (ref 135–145)
Total Bilirubin: 0.3 mg/dL (ref 0.3–1.2)
Total Protein: 6.5 g/dL (ref 6.5–8.1)

## 2022-12-07 LAB — CBC WITH DIFFERENTIAL (CANCER CENTER ONLY)
Abs Immature Granulocytes: 0.05 10*3/uL (ref 0.00–0.07)
Basophils Absolute: 0 10*3/uL (ref 0.0–0.1)
Basophils Relative: 0 %
Eosinophils Absolute: 0.1 10*3/uL (ref 0.0–0.5)
Eosinophils Relative: 1 %
HCT: 26.8 % — ABNORMAL LOW (ref 36.0–46.0)
Hemoglobin: 8.6 g/dL — ABNORMAL LOW (ref 12.0–15.0)
Immature Granulocytes: 1 %
Lymphocytes Relative: 13 %
Lymphs Abs: 0.7 10*3/uL (ref 0.7–4.0)
MCH: 35.8 pg — ABNORMAL HIGH (ref 26.0–34.0)
MCHC: 32.1 g/dL (ref 30.0–36.0)
MCV: 111.7 fL — ABNORMAL HIGH (ref 80.0–100.0)
Monocytes Absolute: 0.3 10*3/uL (ref 0.1–1.0)
Monocytes Relative: 6 %
Neutro Abs: 4.2 10*3/uL (ref 1.7–7.7)
Neutrophils Relative %: 79 %
Platelet Count: 150 10*3/uL (ref 150–400)
RBC: 2.4 MIL/uL — ABNORMAL LOW (ref 3.87–5.11)
RDW: 13 % (ref 11.5–15.5)
WBC Count: 5.3 10*3/uL (ref 4.0–10.5)
nRBC: 0 % (ref 0.0–0.2)

## 2022-12-07 LAB — IRON AND IRON BINDING CAPACITY (CC-WL,HP ONLY)
Iron: 62 ug/dL (ref 28–170)
Saturation Ratios: 19 % (ref 10.4–31.8)
TIBC: 336 ug/dL (ref 250–450)
UIBC: 274 ug/dL (ref 148–442)

## 2022-12-07 LAB — FERRITIN: Ferritin: 39 ng/mL (ref 11–307)

## 2022-12-07 LAB — TSH: TSH: 2.842 u[IU]/mL (ref 0.350–4.500)

## 2022-12-07 NOTE — Progress Notes (Signed)
Patient Care Team: [name], MD as PCP - General (Family Medicine) [name], MD as PCP - Cardiology (Cardiology) [name], MD as Attending Physician (Family Medicine) Lonie Peak, MD as Consulting Physician (Radiation Oncology) Malmfelt, Lise Auer, RN as Oncology Nurse Navigator [name], MD as Consulting Physician (Cardiology)  DIAGNOSIS:  Encounter Diagnoses  Name Primary?   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  History of Present Illness    The patient, with a history of chronic kidney disease and lung cancer, presents with ongoing anemia and balance issues. She reports feeling tired, but not more so than usual. She has been experiencing balance issues for approximately six months, which has led to a fear of falling due to a previous fall that resulted in a severe knee injury. She has not fallen since that incident a year ago. CBC from today shows a hemoglobin of 8.6 g/dL which has essentially remained stable over the past several months.  She she has been taking oral iron supplementation. She reports having intestinal issues, specifically urgency, but do not attribute these to the iron supplements.  She is otherwise following up with nephrology, ENT.  No other concerning review of  systems.  ALLERGIES:  is allergic to penicillins, banana, detrol [tolterodine], quetiapine, risperidone and related, and lisinopril.  MEDICATIONS:  Current Outpatient Medications  Medication Sig Dispense Refill   carvedilol (COREG) 12.5 MG tablet Take 25 mg by mouth 2 (two) times daily.     Cholecalciferol (VITAMIN D3) 5000 UNITS TABS Take 5,000 Units by mouth 3 (three) times daily after meals.      CREON 3000-9500 units CPEP Take 3 capsules by mouth in the morning, at noon, and at bedtime.     ferrous sulfate 324 MG TBEC Take 324 mg by mouth 2 (two) times daily. Takes     glipiZIDE (GLUCOTROL XL) 2.5 MG 24 hr tablet Take 5 mg by mouth 2 (two) times daily.      insulin glargine, 1 Unit Dial, (TOUJEO SOLOSTAR) 300 UNIT/ML Solostar Pen Inject 5-7 Units into the skin daily.     Lancets (FREESTYLE) lancets by Does not apply route.     levothyroxine (SYNTHROID) 175 MCG tablet Take 175 mcg by mouth daily before breakfast. 6 days a week, does not take on sundays     Melatonin 5 MG CAPS Take 5 mg by mouth See admin instructions. Take 5 mg 2 hours before bedtime     Multiple Vitamins-Minerals (ICAPS AREDS 2 PO) Take 1 tablet by mouth 2 (two) times daily.     omeprazole (PRILOSEC) 20 MG capsule Take 20 mg by mouth daily.     Polyethyl Glycol-Propyl Glycol (SYSTANE OP) Place 1 drop into both eyes every evening.     potassium chloride (MICRO-K) 10 MEQ CR capsule Take 20  mEq by mouth 2 (two) times daily. 20mg  in the am 10mg  in the afternoon and 10mg  at night     pramipexole (MIRAPEX) 0.25 MG tablet Take 0.25-0.5 mg by mouth See admin instructions. Take 0.5 mg 2 hours prior to bedtime then 0.25 mg at bedtime     predniSONE (DELTASONE) 10 MG tablet Take 5 mg by mouth daily with breakfast.     rosuvastatin (CRESTOR) 5 MG tablet Take 1 tablet (5 mg total) by mouth 2 (two) times a week. Take on Mondays and Thursdays. 30 tablet 3   sulfamethoxazole-trimethoprim (BACTRIM) 400-80 MG tablet Take 1 tablet by mouth 3  (three) times a week. Monday Wednesday friday     tacrolimus (PROGRAF) 0.5 MG capsule Take 2 mg by mouth every 12 (twelve) hours.     temazepam (RESTORIL) 15 MG capsule Take 15-30 mg by mouth at bedtime.      torsemide (DEMADEX) 20 MG tablet Take 3 tablets (60 mg total) by mouth daily. 90 tablet 0   vitamin B-12 (CYANOCOBALAMIN) 500 MCG tablet Take 500 mcg by mouth daily.     No current facility-administered medications for this visit.    PHYSICAL EXAMINATION: ECOG PERFORMANCE STATUS: 1 - Symptomatic but completely ambulatory  Vitals:   12/07/22 1417  BP: (!) 162/77  Pulse: 70  Resp: 17  Temp: 97.6 F (36.4 C)  SpO2: 98%   Filed Weights   12/07/22 1417  Weight: 123 lb 9.6 oz (56.1 kg)    Physical Exam   General appearance: Alert, oriented and in no acute distress Neck: No palpable cervical adenopathy Chest: Clear to auscultation bilaterally Heart: Rate and rhythm regular SKIN: Chronic venous stasis on both legs,     LABORATORY DATA:  I have reviewed the data as listed    Latest Ref Rng & Units 12/07/2022    1:23 PM 10/11/2022    3:36 PM 07/17/2022    3:24 PM  CMP  Glucose 70 - 99 mg/dL 161  096  045   BUN 8 - 23 mg/dL 60  71  57   Creatinine 0.44 - 1.00 mg/dL 4.09  8.11  9.14   Sodium 135 - 145 mmol/L 142  141  141   Potassium 3.5 - 5.1 mmol/L 4.6  4.0  4.2   Chloride 98 - 111 mmol/L 105  107  106   CO2 22 - 32 mmol/L 31  27  20    Calcium 8.9 - 10.3 mg/dL 8.4  8.3  8.6   Total Protein 6.5 - 8.1 g/dL 6.5  6.1    Total Bilirubin 0.3 - 1.2 mg/dL 0.3  0.3    Alkaline Phos 38 - 126 U/L 52  49    AST 15 - 41 U/L 17  16    ALT 0 - 44 U/L 16  12      Lab Results  Component Value Date   WBC 5.3 12/07/2022   HGB 8.6 (L) 12/07/2022   HCT 26.8 (L) 12/07/2022   MCV 111.7 (H) 12/07/2022   PLT 150 12/07/2022   NEUTROABS 4.2 12/07/2022    ASSESSMENT & PLAN:  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  Patient is currently on iron supplements twice daily. Discussed potential use of erythropoietin injections to improve anemia and associated fatigue, pending confirmation of no active lung cancer.  She has a follow-up CT in January.  She can return to clinic after  January. -Continue iron supplements twice daily. -Consider erythropoietin injections if lung cancer is confirmed to be inactive.  Lung Cancer Patient has a history of lung cancer and has been treated with radiation twice. A CT scan is scheduled for January to monitor the lung nodules.  -If CT scan confirms no active lung cancer, consider erythropoietin injections for anemia.  Balance Issues Patient reports feeling unsteady and fear of falling, ongoing for approximately six months. No recent falls reported. -Consider referral to physical therapy or balance training.  General Health Maintenance -ENT appointment scheduled in 2-3 weeks for follow-up on malignant neoplasm of supraglottis -Follow-up appointment in January after imaging study results are available. -If patient feels excessively weak or if hemoglobin falls below 8 g/dL, consider blood transfusion.      ------------------------------------------------------------------------------------------------------------------------------   No orders of the defined types were placed in this encounter.  The patient has a good understanding of the overall plan. she agrees with it. she will call with any problems that may develop before the next visit here.  Total time spent: 30 mins including face to face time and time spent for planning, charting and co-ordination of care   Rachel Moulds, MD 12/07/22

## 2022-12-07 NOTE — Progress Notes (Deleted)
[facility] CONSULT NOTE  Patient Care Team: [name], MD as PCP - General (Family Medicine) [name], MD as PCP - Cardiology (Cardiology) [name], MD as Attending Physician (Family Medicine) Lonie Peak, MD as Consulting Physician (Radiation Oncology) Malmfelt, Lise Auer, RN as Oncology Nurse Navigator [name], MD as Consulting Physician (Cardiology)  CHIEF COMPLAINTS/PURPOSE OF CONSULTATION:  Normocytic normochromic anemia   ASSESSMENT & PLAN:   This is a very pleasant 82 year old female patient with past medical history significant for diabetes, hypertension, breast cancer, malignant neoplasm of supraglottis, 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 They were ok with epo if there is contraindication such as solid tumors/active disease. Her most recent CT scan shows mild increase in the lung nodule, she is now undergoing radiation for this lung nodule, she will complete radiation next week.   Once there is no evidence of active malignancy, I believe she may be a candidate for EPO especially with her CKD stage IV and ongoing fatigue with severe anemia.  She will do a CBC today.  No concerns on physical exam except for chronic venous stasis changes and easy bruising.  Return to clinic in early September with repeat labs. All her questions were answered to the best my knowledge.  She can return to clinic as scheduled in third week of May.  HISTORY OF PRESENTING ILLNESS:   Anita Pollard 82 y.o. female is here because of anemia.  This is a very pleasant 82 year old female patient with past medical history significant for right-sided breast cancer, cancer of the supraglottis status postradiation, type 2 diabetes, hypertension, kidney transplant referred to hematology  for evaluation of slowly progressive anemia. She also has lung nodules which were concerning for primary lung cancer, received radiation in 2022 and is now back on SBRT for right upper lobe lung nodule.  She is going through this right now.  She complains of ongoing fatigue, not definitely worse compared to her last visit.  She also is followed by [facility] transplant team and has stage IV chronic kidney disease. Since her last visit here, she denies any bleeding issues.  She has gone to the beach for some time and had a good time.  Rest of the pertinent 10 point ROS reviewed and negative  MEDICAL HISTORY:  Past Medical History:  Diagnosis Date   Alcohol abuse    Anemia    Arthritis    Breast cancer (HCC) 2010   Right   Carotid artery disease (HCC)    Chronic heart failure with preserved ejection fraction (HFpEF) (HCC)    Chronic kidney disease    Colon polyp    Depression    Diabetes mellitus    GERD (gastroesophageal reflux disease)    Heart murmur    History of kidney stones 1976   HTN (hypertension)    Hypothyroidism    Osteopenia    Pancreatitis    Ulcerative esophagitis     SURGICAL HISTORY: Past Surgical History:  Procedure Laterality Date   ABDOMINAL HYSTERECTOMY     ABDOMINOPLASTY     APPENDECTOMY     BREAST BIOPSY Right 08/03/2008   Stereo Bx, Malignant   BREAST LUMPECTOMY Right 08/26/2008   CATARACT EXTRACTION W/ INTRAOCULAR LENS  IMPLANT, BILATERAL     CO2 LASER APPLICATION N/A 01/09/2020  Procedure: CO2 LASER APPLICATION;  Surgeon: Christia Reading, MD;  Location[facility] OR;  Service: ENT;  Laterality: N/A;   COSMETIC SURGERY     ductal carcinoma     right breast    EYE SURGERY     HIP ARTHROPLASTY Right    INSERTION OF DIALYSIS CATHETER Right 09/25/2012   Procedure: INSERTION OF DIALYSIS CATHETER;  Surgeon: Pryor Ochoa, MD;  Location: [facility] OR;  Service: Vascular;  Laterality: Right;  Righ Internal Jugular Placement   IR GASTROSTOMY TUBE MOD SED  02/10/2020   IR  GASTROSTOMY TUBE REMOVAL  05/25/2020   JOINT REPLACEMENT     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   MICROLARYNGOSCOPY N/A 10/20/2019   Procedure: Suspended Microlaryngoscopy with Biopsy;  Surgeon: Christia Reading, MD;  Location: [facility] OR;  Service: ENT;  Laterality: N/A;   MICROLARYNGOSCOPY N/A 01/09/2020   Procedure: MICROLARYNGOSCOPY WITH BIOPSY;  Surgeon: Christia Reading, MD;  Location: [facility] OR;  Service: ENT;  Laterality: N/A;   RIGID ESOPHAGOSCOPY N/A 10/20/2019   Procedure: RIGID ESOPHAGOSCOPY;  Surgeon: Christia Reading, MD;  Location: [facility] OR;  Service: ENT;  Laterality: N/A;   THYROIDECTOMY     TUMMY TUCK      SOCIAL HISTORY: Social History   Socioeconomic History   Marital status: Widowed    Spouse name: Not on file   Number of children: Not on file   Years of education: Not on file   Highest education level: Not on file  Occupational History   Not on file  Tobacco Use   Smoking status: Former    Current packs/day: 0.00    Types: Cigarettes    Quit date: 09/25/1983    Years since quitting: 39.2   Smokeless tobacco: Never  Vaping Use   Vaping status: Never Used  Substance and Sexual Activity   Alcohol use: Yes    Comment: socially history of heavy drinking with intervention x 2-at least 2 occasions since april '14   Drug use: No   Sexual activity: Never  Other Topics Concern   Not on file  Social History Narrative   Not on file   Social Determinants of Health   Financial Resource Strain: Low Risk  (01/22/2020)   Overall Financial Resource Strain (CARDIA)    Difficulty of Paying Living Expenses: Not hard at all  Food Insecurity: No Food Insecurity (06/30/2022)   Hunger Vital Sign    Worried About Running Out of Food in the Last Year: Never true    Ran Out of Food in the Last Year: Never true  Transportation Needs: No Transportation Needs (06/30/2022)   PRAPARE - Administrator, Civil Service (Medical): No    Lack of Transportation (Non-Medical): No  Physical  Activity: Insufficiently Active (01/22/2020)   Exercise Vital Sign    Days of Exercise per Week: 5 days    Minutes of Exercise per Session: 20 min  Stress: Not on file  Social Connections: Moderately Isolated (01/22/2020)   Social Connection and Isolation Panel [NHANES]    Frequency of Communication with Friends and Family: More than three times a week    Frequency of Social Gatherings with Friends and Family: Never    Attends Religious Services: Never    Database administrator or Organizations: Yes    Attends Banker Meetings: Never    Marital Status: Widowed  Intimate Partner Violence: Not At Risk (06/30/2022)   Humiliation, Afraid, Rape, and Kick questionnaire    Fear of Current or Ex-Partner:  No    Emotionally Abused: No    Physically Abused: No    Sexually Abused: No    FAMILY HISTORY: Family History  Problem Relation Age of Onset   Hypertension Mother    Diabetes Father    Hypertension Father    Hypertension Sister    Dementia Sister    Multiple myeloma Sister    Thyroid cancer Daughter 76   Diabetes Brother    Colon polyps Brother    Atrial fibrillation Brother    Breast cancer Neg Hx     ALLERGIES:  is allergic to penicillins, banana, detrol [tolterodine], quetiapine, risperidone and related, and lisinopril.  MEDICATIONS:  Current Outpatient Medications  Medication Sig Dispense Refill   carvedilol (COREG) 12.5 MG tablet Take 25 mg by mouth 2 (two) times daily.     Cholecalciferol (VITAMIN D3) 5000 UNITS TABS Take 5,000 Units by mouth 3 (three) times daily after meals.      CREON 3000-9500 units CPEP Take 3 capsules by mouth in the morning, at noon, and at bedtime.     ferrous sulfate 324 MG TBEC Take 324 mg by mouth 2 (two) times daily. Takes     glipiZIDE (GLUCOTROL XL) 2.5 MG 24 hr tablet Take 5 mg by mouth 2 (two) times daily.      insulin glargine, 1 Unit Dial, (TOUJEO SOLOSTAR) 300 UNIT/ML Solostar Pen Inject 5-7 Units into the skin daily.      Lancets (FREESTYLE) lancets by Does not apply route.     levothyroxine (SYNTHROID) 175 MCG tablet Take 175 mcg by mouth daily before breakfast. 6 days a week, does not take on sundays     Melatonin 5 MG CAPS Take 5 mg by mouth See admin instructions. Take 5 mg 2 hours before bedtime     Multiple Vitamins-Minerals (ICAPS AREDS 2 PO) Take 1 tablet by mouth 2 (two) times daily.     omeprazole (PRILOSEC) 20 MG capsule Take 20 mg by mouth daily.     Polyethyl Glycol-Propyl Glycol (SYSTANE OP) Place 1 drop into both eyes every evening.     potassium chloride (MICRO-K) 10 MEQ CR capsule Take 20 mEq by mouth 2 (two) times daily. 20mg  in the am 10mg  in the afternoon and 10mg  at night     pramipexole (MIRAPEX) 0.25 MG tablet Take 0.25-0.5 mg by mouth See admin instructions. Take 0.5 mg 2 hours prior to bedtime then 0.25 mg at bedtime     predniSONE (DELTASONE) 10 MG tablet Take 5 mg by mouth daily with breakfast.     rosuvastatin (CRESTOR) 5 MG tablet Take 1 tablet (5 mg total) by mouth 2 (two) times a week. Take on Mondays and Thursdays. 30 tablet 3   sulfamethoxazole-trimethoprim (BACTRIM) 400-80 MG tablet Take 1 tablet by mouth 3 (three) times a week. Monday Wednesday friday     tacrolimus (PROGRAF) 0.5 MG capsule Take 2 mg by mouth every 12 (twelve) hours.     temazepam (RESTORIL) 15 MG capsule Take 15-30 mg by mouth at bedtime.      torsemide (DEMADEX) 20 MG tablet Take 3 tablets (60 mg total) by mouth daily. 90 tablet 0   vitamin B-12 (CYANOCOBALAMIN) 500 MCG tablet Take 500 mcg by mouth daily.     No current facility-administered medications for this visit.   PHYSICAL EXAMINATION:  ECOG PERFORMANCE STATUS: 0 - Asymptomatic  There were no vitals filed for this visit.   Physical Exam Constitutional:      Appearance: Normal appearance.  Cardiovascular:     Rate and Rhythm: Normal rate and regular rhythm.     Pulses: Normal pulses.     Heart sounds: Normal heart sounds.  Pulmonary:      Effort: Pulmonary effort is normal.     Breath sounds: Normal breath sounds.  Abdominal:     General: Abdomen is flat.     Palpations: Abdomen is soft.  Musculoskeletal:        General: Swelling (Venous stasis changes with bilateral lower extremity swelling, no change) present.     Cervical back: Normal range of motion. No rigidity.  Lymphadenopathy:     Cervical: No cervical adenopathy.  Skin:    General: Skin is warm and dry.  Neurological:     General: No focal deficit present.     Mental Status: She is alert.      LABORATORY DATA:  I have reviewed the data as listed Lab Results  Component Value Date   WBC 5.5 10/11/2022   HGB 8.3 (L) 10/11/2022   HCT 24.8 (L) 10/11/2022   MCV 106.0 (H) 10/11/2022   PLT 137 (L) 10/11/2022     Chemistry      Component Value Date/Time   NA 141 10/11/2022 1536   NA 141 07/17/2022 1524   NA 140 08/28/2013 0955   K 4.0 10/11/2022 1536   K 3.0 (LL) 08/28/2013 0955   CL 107 10/11/2022 1536   CL 111 (H) 09/03/2012 1249   CO2 27 10/11/2022 1536   CO2 16 (L) 08/28/2013 0955   BUN 71 (H) 10/11/2022 1536   BUN 57 (H) 07/17/2022 1524   BUN 75.7 (H) 08/28/2013 0955   CREATININE 2.45 (H) 10/11/2022 1536   CREATININE 1.67 (H) 09/28/2020 1432   CREATININE 5.9 (HH) 08/28/2013 0955      Component Value Date/Time   CALCIUM 8.3 (L) 10/11/2022 1536   CALCIUM 7.5 (L) 08/28/2013 0955   ALKPHOS 49 10/11/2022 1536   ALKPHOS 62 08/28/2013 0955   AST 16 10/11/2022 1536   AST 15 09/28/2020 1432   AST 15 08/28/2013 0955   ALT 12 10/11/2022 1536   ALT 15 09/28/2020 1432   ALT 17 08/28/2013 0955   BILITOT 0.3 10/11/2022 1536   BILITOT 0.3 09/28/2020 1432   BILITOT 0.38 08/28/2013 0955     Reviewed labs from [facility] done on December 14.  Her white blood cell count of 5.7, hemoglobin is 10.3, platelet count of 210,000, absolute lymphocytes were low and absolute eosinophils were low, otherwise iron panel showed a ferritin of 46 and total iron binding  capacity of 326, iron saturation of 25%.   07/21/2021, CBC showed white blood cell count of 6000, hemoglobin of 10, hematocrit of 30.2 and platelet count of 187,000. Iron and iron binding panel normal.  B12 is normal.  01/19/2022 CBC showed hemoglobin of 9.2 g/dL, total white blood cell count of 5.6 and platelet count of 183,000.  Iron panel with increasing total iron binding capacity suggestive of iron deficiency, ferritin pending  I spoke with Dr Gardiner Ramus and he suggested she has CKD 4, ferritin 33. I couldn't see the labs in care everywhere.  We will do CBC today.  RADIOGRAPHIC STUDIES: I have personally reviewed the radiological images as listed and agreed with the findings in the report. No results found.  Total time spent: 30 minutes including history, review of recent records, labs, counseling and coordination of care     Rachel Moulds, MD 12/07/2022 1:36 PM

## 2022-12-07 NOTE — Assessment & Plan Note (Signed)
Anemia of chronic Kidney Disease Stable with slight worsening of kidney function.  Patient is currently on iron supplements twice daily. Discussed potential use of erythropoietin injections to improve anemia and associated fatigue, pending confirmation of no active lung cancer.  She has a follow-up CT in January.  She can return to clinic after January. -Continue iron supplements twice daily. -Consider erythropoietin injections if lung cancer is confirmed to be inactive.  Lung Cancer Patient has a history of lung cancer and has been treated with radiation twice. A CT scan is scheduled for January to monitor the lung nodules.  -If CT scan confirms no active lung cancer, consider erythropoietin injections for anemia.  Balance Issues Patient reports feeling unsteady and fear of falling, ongoing for approximately six months. No recent falls reported. -Consider referral to physical therapy or balance training.  General Health Maintenance -ENT appointment scheduled in 2-3 weeks for follow-up on malignant neoplasm of supraglottis -Follow-up appointment in January after imaging study results are available. -If patient feels excessively weak or if hemoglobin falls below 8 g/dL, consider blood transfusion.

## 2022-12-12 ENCOUNTER — Telehealth: Payer: Self-pay | Admitting: Hematology and Oncology

## 2022-12-12 NOTE — Telephone Encounter (Signed)
Patient is aware of scheduled appointment times/dates

## 2022-12-21 DIAGNOSIS — C321 Malignant neoplasm of supraglottis: Secondary | ICD-10-CM | POA: Diagnosis not present

## 2022-12-21 DIAGNOSIS — Z8521 Personal history of malignant neoplasm of larynx: Secondary | ICD-10-CM | POA: Diagnosis not present

## 2023-01-22 DIAGNOSIS — Z6825 Body mass index (BMI) 25.0-25.9, adult: Secondary | ICD-10-CM | POA: Diagnosis not present

## 2023-01-22 DIAGNOSIS — R0981 Nasal congestion: Secondary | ICD-10-CM | POA: Diagnosis not present

## 2023-01-22 DIAGNOSIS — R0689 Other abnormalities of breathing: Secondary | ICD-10-CM | POA: Diagnosis not present

## 2023-01-22 DIAGNOSIS — R053 Chronic cough: Secondary | ICD-10-CM | POA: Diagnosis not present

## 2023-01-22 DIAGNOSIS — I700 Atherosclerosis of aorta: Secondary | ICD-10-CM | POA: Diagnosis not present

## 2023-01-22 DIAGNOSIS — J439 Emphysema, unspecified: Secondary | ICD-10-CM | POA: Diagnosis not present

## 2023-01-22 DIAGNOSIS — Z923 Personal history of irradiation: Secondary | ICD-10-CM | POA: Diagnosis not present

## 2023-01-22 DIAGNOSIS — J069 Acute upper respiratory infection, unspecified: Secondary | ICD-10-CM | POA: Diagnosis not present

## 2023-01-25 DIAGNOSIS — E1165 Type 2 diabetes mellitus with hyperglycemia: Secondary | ICD-10-CM | POA: Diagnosis not present

## 2023-01-25 DIAGNOSIS — Z940 Kidney transplant status: Secondary | ICD-10-CM | POA: Diagnosis not present

## 2023-01-25 DIAGNOSIS — E559 Vitamin D deficiency, unspecified: Secondary | ICD-10-CM | POA: Diagnosis not present

## 2023-01-25 DIAGNOSIS — Z85118 Personal history of other malignant neoplasm of bronchus and lung: Secondary | ICD-10-CM | POA: Diagnosis not present

## 2023-01-25 DIAGNOSIS — E890 Postprocedural hypothyroidism: Secondary | ICD-10-CM | POA: Diagnosis not present

## 2023-01-25 DIAGNOSIS — I10 Essential (primary) hypertension: Secondary | ICD-10-CM | POA: Diagnosis not present

## 2023-01-29 ENCOUNTER — Telehealth: Payer: Self-pay | Admitting: *Deleted

## 2023-01-29 NOTE — Telephone Encounter (Signed)
RETURNED PATIENT'S PHONE CALL, LVM FOR A RETURN CALL 

## 2023-01-31 ENCOUNTER — Ambulatory Visit: Payer: Self-pay | Admitting: Radiation Oncology

## 2023-02-05 DIAGNOSIS — E1165 Type 2 diabetes mellitus with hyperglycemia: Secondary | ICD-10-CM | POA: Diagnosis not present

## 2023-02-06 ENCOUNTER — Emergency Department (HOSPITAL_BASED_OUTPATIENT_CLINIC_OR_DEPARTMENT_OTHER)
Admission: EM | Admit: 2023-02-06 | Discharge: 2023-02-06 | Disposition: A | Discharge status: Home / Self Care | Payer: Medicare PPO | Attending: Emergency Medicine | Admitting: Emergency Medicine

## 2023-02-06 ENCOUNTER — Encounter (HOSPITAL_BASED_OUTPATIENT_CLINIC_OR_DEPARTMENT_OTHER): Payer: Self-pay | Admitting: Emergency Medicine

## 2023-02-06 ENCOUNTER — Other Ambulatory Visit: Payer: Self-pay

## 2023-02-06 ENCOUNTER — Emergency Department (HOSPITAL_BASED_OUTPATIENT_CLINIC_OR_DEPARTMENT_OTHER): Payer: Medicare PPO

## 2023-02-06 DIAGNOSIS — S51011A Laceration without foreign body of right elbow, initial encounter: Secondary | ICD-10-CM | POA: Diagnosis not present

## 2023-02-06 DIAGNOSIS — S51019A Laceration without foreign body of unspecified elbow, initial encounter: Secondary | ICD-10-CM

## 2023-02-06 DIAGNOSIS — Z794 Long term (current) use of insulin: Secondary | ICD-10-CM | POA: Diagnosis not present

## 2023-02-06 DIAGNOSIS — S0990XA Unspecified injury of head, initial encounter: Secondary | ICD-10-CM | POA: Diagnosis not present

## 2023-02-06 DIAGNOSIS — S0003XA Contusion of scalp, initial encounter: Secondary | ICD-10-CM | POA: Diagnosis not present

## 2023-02-06 DIAGNOSIS — S0101XA Laceration without foreign body of scalp, initial encounter: Secondary | ICD-10-CM | POA: Diagnosis not present

## 2023-02-06 DIAGNOSIS — W01198A Fall on same level from slipping, tripping and stumbling with subsequent striking against other object, initial encounter: Secondary | ICD-10-CM | POA: Insufficient documentation

## 2023-02-06 DIAGNOSIS — S51012A Laceration without foreign body of left elbow, initial encounter: Secondary | ICD-10-CM | POA: Diagnosis not present

## 2023-02-06 LAB — CBG MONITORING, ED: Glucose-Capillary: 171 mg/dL — ABNORMAL HIGH (ref 70–99)

## 2023-02-06 NOTE — ED Notes (Signed)
 RN reviewed discharge instructions with pt. Pt verbalized understanding and had no further questions. VSS upon discharge.  

## 2023-02-06 NOTE — ED Provider Notes (Signed)
EMERGENCY DEPARTMENT AT [facility] Provider Note   CSN: [id number] Arrival date & time: 02/06/23  1304     History  No chief complaint on file.   Anita Pollard is a 82 y.o. female.  HPI    82 year old female comes in with chief complaint of mechanical fall.  Patient tripped while she was doing some gardening and ended up falling backwards.  In the process patient injured her scalp.  She started having significant bleeding.  She was unable to get up initially, but finally did with the help of EMS.  Her family brought her to the ER because of bleeding.  Patient does not take any blood thinners.  She is also complaining of slight discomfort/bleeding over her elbow.  Home Medications Prior to Admission medications   Medication Sig Start Date End Date Taking? Authorizing Provider  carvedilol (COREG) 12.5 MG tablet Take 25 mg by mouth 2 (two) times daily. 05/04/22   [provider]  Cholecalciferol (VITAMIN D3) 5000 UNITS TABS Take 5,000 Units by mouth 3 (three) times daily after meals.     [provider]  CREON 3000-9500 units CPEP Take 3 capsules by mouth in the morning, at noon, and at bedtime. 01/07/15   [provider]  ferrous sulfate 324 MG TBEC Take 324 mg by mouth 2 (two) times daily. Takes    [provider]  glipiZIDE (GLUCOTROL XL) 2.5 MG 24 hr tablet Take 5 mg by mouth 2 (two) times daily.  01/01/14   [provider]  insulin glargine, 1 Unit Dial, (TOUJEO SOLOSTAR) 300 UNIT/ML Solostar Pen Inject 5-7 Units into the skin daily. 09/02/20   [provider]  Lancets (FREESTYLE) lancets by Does not apply route.    [provider]  levothyroxine (SYNTHROID) 175 MCG tablet Take 175 mcg by mouth daily before breakfast. 6 days a week, does not take on sundays    [provider]  Melatonin 5 MG CAPS Take 5 mg by mouth See admin instructions. Take 5 mg 2 hours before bedtime    [provider]  Multiple Vitamins-Minerals (ICAPS AREDS 2 PO) Take 1 tablet by mouth 2 (two) times daily.    [provider]  omeprazole (PRILOSEC) 20 MG capsule Take 20 mg by mouth daily.    [provider]  Polyethyl Glycol-Propyl Glycol (SYSTANE OP) Place 1 drop into both eyes every evening.    [provider]  potassium chloride (MICRO-K) 10 MEQ CR capsule Take 20 mEq by mouth 2 (two) times daily. 20mg  in the am 10mg  in the afternoon and 10mg  at night 05/24/21   [provider]  pramipexole (MIRAPEX) 0.25 MG tablet Take 0.25-0.5 mg by mouth See admin instructions. Take 0.5 mg 2 hours prior to bedtime then 0.25 mg at bedtime    [provider]  predniSONE (DELTASONE) 10 MG tablet Take 5 mg by mouth daily with breakfast.    [provider]  rosuvastatin (CRESTOR) 5 MG tablet Take 1 tablet (5 mg total) by mouth 2 (two) times a week. Take on Mondays and Thursdays. 02/10/21   [name], MD  sulfamethoxazole-trimethoprim (BACTRIM) 400-80 MG tablet Take 1 tablet by mouth 3 (three) times a week. Monday Wednesday friday 12/25/14 04/18/23  [provider]  tacrolimus (PROGRAF) 0.5 MG capsule Take 2 mg by mouth every 12 (twelve) hours.    [provider]  temazepam (RESTORIL) 15 MG capsule Take 15-30 mg by mouth at bedtime.  05/20/13   [provider]  torsemide (DEMADEX) 20 MG tablet Take 3 tablets (60 mg total) by mouth daily. 07/02/22   Joseph Art, DO  vitamin B-12 (CYANOCOBALAMIN) 500 MCG tablet Take 500 mcg by mouth daily.    [provider]      Allergies    Penicillins, Banana, Detrol [tolterodine], Quetiapine, Risperidone and related, and Lisinopril    Review of Systems   Review of Systems  All other systems reviewed and are negative.   Physical Exam Updated Vital Signs BP (!) 181/67 (BP Location: Right Arm)   Pulse 61   Temp 98.1 F (36.7 C)   Resp 19   Wt 55.3 kg   SpO2 96%   BMI 24.64  kg/m  Physical Exam Vitals and nursing note reviewed.  Constitutional:      Appearance: She is well-developed.  HENT:     Head: Atraumatic.  Cardiovascular:     Rate and Rhythm: Normal rate.  Pulmonary:     Effort: Pulmonary effort is normal.  Musculoskeletal:     Cervical back: Normal range of motion and neck supple.     Comments: Patient has a 1.5 cm laceration over her scalp.  She also has skin tear to both of her elbow, about 2 cm in length  Otherwise:  Head to toe evaluation shows no hematoma, bleeding of the scalp, no facial abrasions, no spine step offs, crepitus of the chest or neck, no tenderness to palpation of the bilateral upper and lower extremities, no gross deformities, no chest tenderness, no pelvic pain.   Skin:    General: Skin is warm and dry.  Neurological:     Mental Status: She is alert and oriented to person, place, and time.     ED Results / Procedures / Treatments   Labs (all labs ordered are listed, but only abnormal results are displayed) Labs Reviewed  CBG MONITORING, ED - Abnormal; Notable for the following components:      Result Value   Glucose-Capillary 171 (*)    All other components within normal limits    EKG None  Radiology CT Head Wo Contrast  Result Date: 02/06/2023 CLINICAL DATA:  Head trauma, minor (Age >= 65y). Fall with head strike. EXAM: CT HEAD WITHOUT CONTRAST TECHNIQUE: Contiguous axial images were obtained from the base of the skull through the vertex without intravenous contrast. RADIATION DOSE REDUCTION: This exam was performed according to the departmental dose-optimization program which includes automated exposure control, adjustment of the mA and/or kV according to patient size and/or use of iterative reconstruction technique. COMPARISON:  Head CT 11/04/2003. FINDINGS: Brain: No acute intracranial hemorrhage. Gray-white differentiation is preserved. No hydrocephalus or extra-axial collection. No mass effect or midline  shift. Vascular: No hyperdense vessel or unexpected calcification. Skull: No calvarial fracture or suspicious bone lesion. Skull base is unremarkable. Sinuses/Orbits: Chronic right sphenoid sinusitis. Orbits are unremarkable. Other: Small left parietal scalp contusion. IMPRESSION: 1. No acute intracranial abnormality. 2. Small left parietal scalp contusion. No calvarial fracture. Electronically Signed   By: Orvan Falconer M.D.   On: 02/06/2023 14:16    Procedures .Marland KitchenLaceration Repair  Date/Time: 02/06/2023 3:09 PM  Performed by: Derwood Kaplan, MD Authorized by: Derwood Kaplan, MD   Consent:    Consent obtained:  Verbal   Consent given by:  Patient   Risks, benefits, and alternatives were discussed: yes     Risks discussed:  Infection, pain and poor wound healing   Alternatives discussed:  No treatment  Universal protocol:    Procedure explained and questions answered to patient or proxy's satisfaction: yes     Immediately prior to procedure, a time out was called: yes     Patient identity confirmed:  Arm band Anesthesia:    Anesthesia method:  None Laceration details:    Location:  Scalp   Scalp location:  Crown   Length (cm):  1.5   Depth (mm):  3 Pre-procedure details:    Preparation:  Patient was prepped and draped in usual sterile fashion Exploration:    Limited defect created (wound extended): no     Wound exploration: wound explored through full range of motion and entire depth of wound visualized     Wound extent: no signs of injury and no vascular damage     Contaminated: no   Treatment:    Area cleansed with:  Saline   Amount of cleaning:  Standard   Irrigation solution:  Tap water   Visualized foreign bodies/material removed: no     Debridement:  None   Undermining:  None   Scar revision: no   Skin repair:    Repair method:  Staples   Number of staples:  2 Approximation:    Approximation:  Close Repair type:    Repair type:  Simple Post-procedure details:     Dressing:  Open (no dressing)   Procedure completion:  Tolerated well, no immediate complications     Medications Ordered in ED Medications - No data to display  ED Course/ Medical Decision Making/ A&P                                 Medical Decision Making Amount and/or Complexity of Data Reviewed Radiology: ordered.   82 year old patient comes in after sustaining what appears to be a mechanical fall. Pertinent past medical includes no blood thinner use.  Elbow pain, but able to pronate, supinate and has skin tear only.    Based on my history and exam, differential diagnosis includes: - Traumatic brain injury including intracranial hemorrhage - Long bone fractures - Contusions - Soft tissue injury - Concussion  Based on the initial assessment, the following workup was initiated plan to get CT scan of the brain.  Scalp will need about 1 or 2 staples.  Skin over the elbow can heal on its own.  Dressing will be applied.  I have independently interpreted the following imaging from the perspective of acute trauma: CT scan of the brain and the results indicate no evidence of brain bleed.    Final Clinical Impression(s) / ED Diagnoses Final diagnoses:  Skin tear of elbow without complication, unspecified laterality, initial encounter  Scalp laceration, initial encounter    Rx / DC Orders ED Discharge Orders     None         Derwood Kaplan, MD 02/06/23 1511

## 2023-02-06 NOTE — ED Triage Notes (Signed)
Pt slipped on plastic, fell backwards, hit head and both elbows on the ground. This was earlier today. Pt not on blood thinners. No LOC

## 2023-02-06 NOTE — Discharge Instructions (Signed)
Please take care of the skin tear area by keeping the area clean and dry. The staples will need to come out in 7 days.

## 2023-02-06 NOTE — ED Notes (Signed)
Nonadherent and bacitracin applied to bilateral elbow skin tears.

## 2023-02-06 NOTE — ED Triage Notes (Signed)
Pt was not able to get up for 45 minutes, til ems came

## 2023-02-13 DIAGNOSIS — S0101XD Laceration without foreign body of scalp, subsequent encounter: Secondary | ICD-10-CM | POA: Diagnosis not present

## 2023-02-13 DIAGNOSIS — Z6825 Body mass index (BMI) 25.0-25.9, adult: Secondary | ICD-10-CM | POA: Diagnosis not present

## 2023-02-26 ENCOUNTER — Other Ambulatory Visit: Payer: Self-pay

## 2023-02-26 ENCOUNTER — Observation Stay (HOSPITAL_COMMUNITY)
Admission: EM | Admit: 2023-02-26 | Discharge: 2023-02-27 | Disposition: A | Discharge status: Home Health | Payer: Medicare PPO | Attending: Family Medicine | Admitting: Family Medicine

## 2023-02-26 DIAGNOSIS — K922 Gastrointestinal hemorrhage, unspecified: Secondary | ICD-10-CM | POA: Insufficient documentation

## 2023-02-26 DIAGNOSIS — D631 Anemia in chronic kidney disease: Secondary | ICD-10-CM | POA: Diagnosis not present

## 2023-02-26 DIAGNOSIS — K625 Hemorrhage of anus and rectum: Secondary | ICD-10-CM | POA: Diagnosis not present

## 2023-02-26 DIAGNOSIS — R9431 Abnormal electrocardiogram [ECG] [EKG]: Secondary | ICD-10-CM | POA: Diagnosis not present

## 2023-02-26 DIAGNOSIS — I5032 Chronic diastolic (congestive) heart failure: Secondary | ICD-10-CM | POA: Diagnosis not present

## 2023-02-26 DIAGNOSIS — N179 Acute kidney failure, unspecified: Secondary | ICD-10-CM | POA: Diagnosis not present

## 2023-02-26 DIAGNOSIS — R5383 Other fatigue: Secondary | ICD-10-CM

## 2023-02-26 DIAGNOSIS — R946 Abnormal results of thyroid function studies: Secondary | ICD-10-CM | POA: Diagnosis not present

## 2023-02-26 DIAGNOSIS — Z79899 Other long term (current) drug therapy: Secondary | ICD-10-CM | POA: Diagnosis not present

## 2023-02-26 DIAGNOSIS — Z794 Long term (current) use of insulin: Secondary | ICD-10-CM | POA: Diagnosis not present

## 2023-02-26 DIAGNOSIS — R531 Weakness: Secondary | ICD-10-CM | POA: Insufficient documentation

## 2023-02-26 DIAGNOSIS — I130 Hypertensive heart and chronic kidney disease with heart failure and stage 1 through stage 4 chronic kidney disease, or unspecified chronic kidney disease: Secondary | ICD-10-CM | POA: Insufficient documentation

## 2023-02-26 DIAGNOSIS — Z87891 Personal history of nicotine dependence: Secondary | ICD-10-CM | POA: Insufficient documentation

## 2023-02-26 DIAGNOSIS — Z853 Personal history of malignant neoplasm of breast: Secondary | ICD-10-CM | POA: Diagnosis not present

## 2023-02-26 DIAGNOSIS — R7989 Other specified abnormal findings of blood chemistry: Secondary | ICD-10-CM | POA: Diagnosis not present

## 2023-02-26 DIAGNOSIS — Z940 Kidney transplant status: Secondary | ICD-10-CM | POA: Insufficient documentation

## 2023-02-26 DIAGNOSIS — N184 Chronic kidney disease, stage 4 (severe): Principal | ICD-10-CM | POA: Insufficient documentation

## 2023-02-26 DIAGNOSIS — C50919 Malignant neoplasm of unspecified site of unspecified female breast: Secondary | ICD-10-CM | POA: Diagnosis present

## 2023-02-26 DIAGNOSIS — C321 Malignant neoplasm of supraglottis: Secondary | ICD-10-CM | POA: Insufficient documentation

## 2023-02-26 DIAGNOSIS — D649 Anemia, unspecified: Secondary | ICD-10-CM | POA: Diagnosis present

## 2023-02-26 DIAGNOSIS — Z85118 Personal history of other malignant neoplasm of bronchus and lung: Secondary | ICD-10-CM | POA: Diagnosis not present

## 2023-02-26 DIAGNOSIS — E039 Hypothyroidism, unspecified: Secondary | ICD-10-CM | POA: Insufficient documentation

## 2023-02-26 LAB — COMPREHENSIVE METABOLIC PANEL
ALT: 15 U/L (ref 0–44)
AST: 14 U/L — ABNORMAL LOW (ref 15–41)
Albumin: 3.3 g/dL — ABNORMAL LOW (ref 3.5–5.0)
Alkaline Phosphatase: 48 U/L (ref 38–126)
Anion gap: 3 — ABNORMAL LOW (ref 5–15)
BUN: 66 mg/dL — ABNORMAL HIGH (ref 8–23)
CO2: 17 mmol/L — ABNORMAL LOW (ref 22–32)
Calcium: 8.1 mg/dL — ABNORMAL LOW (ref 8.9–10.3)
Chloride: 116 mmol/L — ABNORMAL HIGH (ref 98–111)
Creatinine, Ser: 2.91 mg/dL — ABNORMAL HIGH (ref 0.44–1.00)
GFR, Estimated: 15 mL/min — ABNORMAL LOW (ref >60)
Glucose, Bld: 152 mg/dL — ABNORMAL HIGH (ref 70–99)
Potassium: 4.6 mmol/L (ref 3.5–5.1)
Sodium: 136 mmol/L (ref 135–145)
Total Bilirubin: 0.5 mg/dL (ref <1.2)
Total Protein: 6.4 g/dL — ABNORMAL LOW (ref 6.5–8.1)

## 2023-02-26 LAB — CBC WITH DIFFERENTIAL/PLATELET
Abs Immature Granulocytes: 0.06 10*3/uL (ref 0.00–0.07)
Basophils Absolute: 0 10*3/uL (ref 0.0–0.1)
Basophils Relative: 0 %
Eosinophils Absolute: 0.2 10*3/uL (ref 0.0–0.5)
Eosinophils Relative: 3 %
HCT: 25.1 % — ABNORMAL LOW (ref 36.0–46.0)
Hemoglobin: 7.6 g/dL — ABNORMAL LOW (ref 12.0–15.0)
Immature Granulocytes: 1 %
Lymphocytes Relative: 18 %
Lymphs Abs: 1.1 10*3/uL (ref 0.7–4.0)
MCH: 35.3 pg — ABNORMAL HIGH (ref 26.0–34.0)
MCHC: 30.3 g/dL (ref 30.0–36.0)
MCV: 116.7 fL — ABNORMAL HIGH (ref 80.0–100.0)
Monocytes Absolute: 0.5 10*3/uL (ref 0.1–1.0)
Monocytes Relative: 8 %
Neutro Abs: 4 10*3/uL (ref 1.7–7.7)
Neutrophils Relative %: 70 %
Platelets: 205 10*3/uL (ref 150–400)
RBC: 2.15 MIL/uL — ABNORMAL LOW (ref 3.87–5.11)
RDW: 13.4 % (ref 11.5–15.5)
WBC: 5.7 10*3/uL (ref 4.0–10.5)
nRBC: 0 % (ref 0.0–0.2)

## 2023-02-26 LAB — PREPARE RBC (CROSSMATCH)

## 2023-02-26 LAB — GLUCOSE, CAPILLARY: Glucose-Capillary: 186 mg/dL — ABNORMAL HIGH (ref 70–99)

## 2023-02-26 LAB — TSH: TSH: 0.266 u[IU]/mL — ABNORMAL LOW (ref 0.350–4.500)

## 2023-02-26 LAB — CBG MONITORING, ED: Glucose-Capillary: 124 mg/dL — ABNORMAL HIGH (ref 70–99)

## 2023-02-26 MED ORDER — TEMAZEPAM 15 MG PO CAPS
15.0000 mg | ORAL_CAPSULE | Freq: Every evening | ORAL | Status: DC | PRN
  Administered 2023-02-26: 15 mg via ORAL
  Filled 2023-02-26: qty 1

## 2023-02-26 MED ORDER — INSULIN ASPART 100 UNIT/ML IJ SOLN
0.0000 [IU] | Freq: Every day | INTRAMUSCULAR | Status: DC
  Filled 2023-02-26: qty 0.05

## 2023-02-26 MED ORDER — ACETAMINOPHEN 325 MG PO TABS: 650.0000 mg | ORAL_TABLET | Freq: Four times a day (QID) | ORAL | Status: DC | PRN

## 2023-02-26 MED ORDER — SODIUM BICARBONATE 650 MG PO TABS
650.0000 mg | ORAL_TABLET | Freq: Three times a day (TID) | ORAL | Status: DC
  Administered 2023-02-26 – 2023-02-27 (×2): 650 mg via ORAL
  Filled 2023-02-26 (×2): qty 1

## 2023-02-26 MED ORDER — INSULIN GLARGINE-YFGN 100 UNIT/ML ~~LOC~~ SOLN
5.0000 [IU] | Freq: Every day | SUBCUTANEOUS | Status: DC
Start: 2023-02-26 — End: 2023-02-27
  Administered 2023-02-26: 5 [IU] via SUBCUTANEOUS
  Filled 2023-02-26 (×2): qty 0.05

## 2023-02-26 MED ORDER — ACETAMINOPHEN 650 MG RE SUPP: 650.0000 mg | Freq: Four times a day (QID) | RECTAL | Status: DC | PRN

## 2023-02-26 MED ORDER — SODIUM CHLORIDE 0.9% IV SOLUTION: Freq: Once | INTRAVENOUS | Status: DC

## 2023-02-26 MED ORDER — INSULIN ASPART 100 UNIT/ML IJ SOLN
0.0000 [IU] | Freq: Three times a day (TID) | INTRAMUSCULAR | Status: DC
  Administered 2023-02-27: 1 [IU] via SUBCUTANEOUS
  Filled 2023-02-26: qty 0.09

## 2023-02-26 MED ORDER — POLYETHYLENE GLYCOL 3350 17 G PO PACK: 17.0000 g | PACK | Freq: Every day | ORAL | Status: DC | PRN

## 2023-02-26 MED ORDER — SODIUM CHLORIDE 0.9% FLUSH
3.0000 mL | Freq: Two times a day (BID) | INTRAVENOUS | Status: DC
  Administered 2023-02-26 – 2023-02-27 (×2): 3 mL via INTRAVENOUS

## 2023-02-26 MED ORDER — KCL IN DEXTROSE-NACL 20-5-0.45 MEQ/L-%-% IV SOLN
INTRAVENOUS | Status: DC
  Filled 2023-02-26: qty 1000

## 2023-02-26 MED ORDER — ROSUVASTATIN CALCIUM 5 MG PO TABS
5.0000 mg | ORAL_TABLET | ORAL | Status: DC
  Administered 2023-02-26: 5 mg via ORAL
  Filled 2023-02-26: qty 1

## 2023-02-26 NOTE — ED Triage Notes (Signed)
Pt arrived via POV. C/o has been having rectal bleeding for 9 days. Small amounts of blood in the toilet. Hx anemia.   No pain. AOx4

## 2023-02-26 NOTE — ED Provider Notes (Signed)
Anita Pollard EMERGENCY DEPARTMENT AT [facility] Provider Note   CSN: [id number] Arrival date & time: 02/26/23  1345     History  Chief Complaint  Patient presents with   Rectal Bleeding    Anita Pollard is a 82 y.o. female.  The history is provided by the patient and medical records. No language interpreter was used.  Rectal Bleeding Quality:  Bright red and maroon Amount:  Unable to specify Duration:  9 days Timing:  Intermittent Chronicity:  Recurrent Similar prior episodes: yes   Relieved by:  Nothing Worsened by:  Nothing Ineffective treatments:  None tried Associated symptoms: light-headedness   Associated symptoms: no abdominal pain, no dizziness, no fever, no loss of consciousness and no vomiting        Home Medications Prior to Admission medications   Medication Sig Start Date End Date Taking? Authorizing Provider  carvedilol (COREG) 12.5 MG tablet Take 25 mg by mouth 2 (two) times daily. 05/04/22   [provider]  Cholecalciferol (VITAMIN D3) 5000 UNITS TABS Take 5,000 Units by mouth 3 (three) times daily after meals.     [provider]  CREON 3000-9500 units CPEP Take 3 capsules by mouth in the morning, at noon, and at bedtime. 01/07/15   [provider]  ferrous sulfate 324 MG TBEC Take 324 mg by mouth 2 (two) times daily. Takes    [provider]  glipiZIDE (GLUCOTROL XL) 2.5 MG 24 hr tablet Take 5 mg by mouth 2 (two) times daily.  01/01/14   [provider]  insulin glargine, 1 Unit Dial, (TOUJEO SOLOSTAR) 300 UNIT/ML Solostar Pen Inject 5-7 Units into the skin daily. 09/02/20   [provider]  Lancets (FREESTYLE) lancets by Does not apply route.    [provider]  levothyroxine (SYNTHROID) 175 MCG tablet Take 175 mcg by mouth daily before breakfast. 6 days a week, does not take on sundays    [provider]  Melatonin 5 MG CAPS Take 5 mg by mouth See admin instructions. Take 5  mg 2 hours before bedtime    [provider]  Multiple Vitamins-Minerals (ICAPS AREDS 2 PO) Take 1 tablet by mouth 2 (two) times daily.    [provider]  omeprazole (PRILOSEC) 20 MG capsule Take 20 mg by mouth daily.    [provider]  Polyethyl Glycol-Propyl Glycol (SYSTANE OP) Place 1 drop into both eyes every evening.    [provider]  potassium chloride (MICRO-K) 10 MEQ CR capsule Take 20 mEq by mouth 2 (two) times daily. 20mg  in the am 10mg  in the afternoon and 10mg  at night 05/24/21   [provider]  pramipexole (MIRAPEX) 0.25 MG tablet Take 0.25-0.5 mg by mouth See admin instructions. Take 0.5 mg 2 hours prior to bedtime then 0.25 mg at bedtime    [provider]  predniSONE (DELTASONE) 10 MG tablet Take 5 mg by mouth daily with breakfast.    [provider]  rosuvastatin (CRESTOR) 5 MG tablet Take 1 tablet (5 mg total) by mouth 2 (two) times a week. Take on Mondays and Thursdays. 02/10/21   [name], MD  sulfamethoxazole-trimethoprim (BACTRIM) 400-80 MG tablet Take 1 tablet by mouth 3 (three) times a week. Monday Wednesday friday 12/25/14 04/18/23  [provider]  tacrolimus (PROGRAF) 0.5 MG capsule Take 2 mg by mouth every 12 (twelve) hours.    [provider]  temazepam (RESTORIL) 15 MG capsule Take 15-30 mg by  mouth at bedtime.  05/20/13   [provider]  torsemide (DEMADEX) 20 MG tablet Take 3 tablets (60 mg total) by mouth daily. 07/02/22   Joseph Art, DO  vitamin B-12 (CYANOCOBALAMIN) 500 MCG tablet Take 500 mcg by mouth daily.    [provider]      Allergies    Penicillins, Banana, Detrol [tolterodine], Quetiapine, Risperidone and related, and Lisinopril    Review of Systems   Review of Systems  Constitutional:  Positive for fatigue. Negative for chills and fever.  Eyes:  Negative for visual disturbance.  Respiratory:  Negative for cough, chest tightness, shortness of  breath and wheezing.   Cardiovascular:  Negative for chest pain and palpitations.  Gastrointestinal:  Positive for anal bleeding, blood in stool and hematochezia. Negative for abdominal distention, abdominal pain, constipation, diarrhea, nausea, rectal pain and vomiting.  Genitourinary:  Negative for dysuria.  Musculoskeletal:  Negative for back pain, neck pain and neck stiffness.  Skin:  Negative for rash and wound.  Neurological:  Positive for light-headedness. Negative for dizziness, loss of consciousness, weakness and headaches.  Psychiatric/Behavioral:  Negative for agitation and confusion.     Physical Exam Updated Vital Signs BP 110/60 (BP Location: Left Arm)   Pulse 63   Temp (!) 97.3 F (36.3 C) (Oral)   Resp 18   Ht 4\' 11"  (1.499 m)   Wt 54.4 kg   SpO2 99%   BMI 24.24 kg/m  Physical Exam Vitals and nursing note reviewed.  Constitutional:      General: She is not in acute distress.    Appearance: She is well-developed. She is not ill-appearing, toxic-appearing or diaphoretic.  HENT:     Head: Normocephalic and atraumatic.     Right Ear: External ear normal.     Left Ear: External ear normal.     Nose: Nose normal. No congestion or rhinorrhea.     Mouth/Throat:     Mouth: Mucous membranes are moist.     Pharynx: No oropharyngeal exudate or posterior oropharyngeal erythema.  Eyes:     Conjunctiva/sclera: Conjunctivae normal.     Pupils: Pupils are equal, round, and reactive to light.  Cardiovascular:     Rate and Rhythm: Normal rate.     Pulses: Normal pulses.     Heart sounds: No murmur heard. Pulmonary:     Effort: No respiratory distress.     Breath sounds: No stridor. No wheezing, rhonchi or rales.  Chest:     Chest wall: No tenderness.  Abdominal:     General: Abdomen is flat. There is no distension.     Tenderness: There is no abdominal tenderness. There is no right CVA tenderness, left CVA tenderness, guarding or rebound.  Musculoskeletal:         General: No tenderness.     Cervical back: Normal range of motion and neck supple. No tenderness.     Right lower leg: Edema (mild and unchanged per pt) present.     Left lower leg: Edema (nild and unchangetd per pt) present.  Skin:    General: Skin is warm.     Findings: No erythema or rash.  Neurological:     Mental Status: She is alert and oriented to person, place, and time.     Motor: No abnormal muscle tone.     Coordination: Coordination normal.     Deep Tendon Reflexes: Reflexes are normal and symmetric.  Psychiatric:        Mood and Affect:  Mood normal.     ED Results / Procedures / Treatments   Labs (all labs ordered are listed, but only abnormal results are displayed) Labs Reviewed  COMPREHENSIVE METABOLIC PANEL - Abnormal; Notable for the following components:      Result Value   Chloride 116 (*)    CO2 17 (*)    Glucose, Bld 152 (*)    BUN 66 (*)    Creatinine, Ser 2.91 (*)    Calcium 8.1 (*)    Total Protein 6.4 (*)    Albumin 3.3 (*)    AST 14 (*)    GFR, Estimated 15 (*)    Anion gap 3 (*)    All other components within normal limits  CBC WITH DIFFERENTIAL/PLATELET - Abnormal; Notable for the following components:   RBC 2.15 (*)    Hemoglobin 7.6 (*)    HCT 25.1 (*)    MCV 116.7 (*)    MCH 35.3 (*)    All other components within normal limits  URINALYSIS, W/ REFLEX TO CULTURE (INFECTION SUSPECTED)  TACROLIMUS LEVEL  TSH  APTT  PROTIME-INR  BASIC METABOLIC PANEL  CBC  POC OCCULT BLOOD, ED  TYPE AND SCREEN  PREPARE RBC (CROSSMATCH)    EKG EKG Interpretation Date/Time:  Monday February 26 2023 17:28:19 EST Ventricular Rate:  60 PR Interval:  194 QRS Duration:  104 QT Interval:  438 QTC Calculation: 442 R Axis:   74  Text Interpretation: Sinus rhythm Low voltage, precordial leads Minimal ST depression Borderline ST elevation, anterolateral leads when compared to prior, overall similar appearance. no STEMI Confirmed by Theda Belfast ([id number])  on 02/26/2023 6:11:36 PM  Radiology No results found.  Procedures Procedures    CRITICAL CARE Performed by: Canary Brim Remingtyn Depaola Total critical care time: 25 minutes Critical care time was exclusive of separately billable procedures and treating other patients. Critical care was necessary to treat or prevent imminent or life-threatening deterioration. Critical care was time spent personally by me on the following activities: development of treatment plan with patient and/or surrogate as well as nursing, discussions with consultants, evaluation of patient's response to treatment, examination of patient, obtaining history from patient or surrogate, ordering and performing treatments and interventions, ordering and review of laboratory studies, ordering and review of radiographic studies, pulse oximetry and re-evaluation of patient's condition.  Medications Ordered in ED Medications  0.9 %  sodium chloride infusion (Manually program via Guardrails IV Fluids) (has no administration in time range)  acetaminophen (TYLENOL) tablet 650 mg (has no administration in time range)    Or  acetaminophen (TYLENOL) suppository 650 mg (has no administration in time range)  polyethylene glycol (MIRALAX / GLYCOLAX) packet 17 g (has no administration in time range)  sodium chloride flush (NS) 0.9 % injection 3 mL (has no administration in time range)    ED Course/ Medical Decision Making/ A&P                                 Medical Decision Making Amount and/or Complexity of Data Reviewed Labs: ordered.  Risk Prescription drug management. Decision regarding hospitalization.    Anita Pollard is a 82 y.o. 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  According to patient, for the last 9 days she has had bright and dark rectal bleeding  and has had more and more fatigue.  She was told that if her hemoglobin drop below 8 she will need blood transfusions and she is concerned about her energy level.  She denies any chest pain palpitations or shortness of breath.  She denies any abdominal pain rectal pain or back pain.  Denies any head or neck injuries.  She reports she fell a week ago hitting her head while in her basement but had imaging at that time and had staples done.  She reports she is not having any headaches or any neurologic complaints.  She reports no urinary changes.  She says that she does not want to go to [facility] for her care at this time.  She is denying any abdominal pain and says she cannot get a colonoscopy due to "everything is all twisted from a previous surgeries in there".  On exam, lungs clear.  Chest nontender.  Abdomen is nontender.  Flanks and back nontender.  Patient moving all extremities.  Patient resting comfortably with reassuring vital signs initially.  Patient had some screening labs in triage that did show hemoglobin was 7.6.  This is below the 8 cutoff that she was told about and says she does not want blood.  Given her exertional fatigue and lightheadedness I do feel is reasonable to transfuse her for suspected symptomatic anemia.  She also has evidence of acute kidney injury with creatinine of 2.91 up from 2.5 recently.  This is also concerning in the context of her renal transplants.  She had says that she has not eaten or drinking much today without appetite and this fatigue.  She is denying any abdominal pain flank or back pain so we will hold on imaging at this time.  She was told by her GI doctor that she cannot get colonoscopies due to the transplants that she has and how things "are twisted" in there.  Will order a unit of blood for her and will call for medical admission for symptomatic anemia from GI bleed and AKI in the setting of renal transplant.  She reports she does not want to go to [facility] and wants to be cared for here.  Will call for medicine admission.  Will also hold on GI call as she is having no significant pain and was told she has internal hemorrhoids that likely are the cause of her bleeding typically.  She had seen Dr. Byrd Hesselbach with [facility] in the past and they elected not to do surgery at that time.  She reports she has seen Dr. Lanell Matar with GI with [facility] in the past and does not think she needs a GI consult today either here.  Will call for medical admission for the blood transfusion and renal monitoring.  With her fatigue we will add a TSH, EKG, and with her AKI will get urinalysis as well.  EKG does not show STEMI.  Medical team agrees with admission and she will be admitted for further management.         Final Clinical Impression(s) / ED Diagnoses Final diagnoses:  Rectal bleeding  Symptomatic anemia  AKI (acute kidney injury) (HCC)  Fatigue, unspecified type      Clinical Impression: 1. Rectal bleeding   2. Symptomatic anemia   3. AKI (acute kidney injury) (HCC)   4. Fatigue, unspecified type     Disposition: Admit  This note was prepared with assistance of Dragon voice recognition software. Occasional wrong-word or sound-a-like substitutions may have occurred  due to the inherent limitations of voice recognition software.      Lenell Mcconnell, Canary Brim, MD 02/26/23 [phone]

## 2023-02-26 NOTE — Assessment & Plan Note (Addendum)
Reports having bright red blood per rectum ongoing during bowel movements and otherwise for the last 9 or 10 days.  However she has had no bowel movement today.  It is noted that the patient's rectal vault has only bowel soft brown stools and no evidence of ongoing bleeding at this time.  Avoid anticoagulation at this time.  This is most consistent with a lower GI source of bleeding per history which has remitted at this time.  Patient was offered a GI referral by ER attending and by the author.  However patient at this time reports that she sees Dr. Lanell Matar of [facility] as an outpatient.  And she would prefer to follow-up with him.  We will monitor the patient clinically at this time.  I will change the patient's diet to clear liquid while monitoring the patient in the hospital.

## 2023-02-26 NOTE — Assessment & Plan Note (Signed)
And has documented chronic kidney disease as well as prior history of renal transplant.  Patient's baseline creatinine around 2.5.  Noted to have a creatinine of 2.91 today.  Which may represent an AKI.  Although blood urea is only 66 and was 60 and 71 on prior 2 occasions.  Given patient's history of multiple loose bowel movements in the last 10 days, this is most consistent with prerenal AKI possible.  Will treat with fluid bolus today.  Patient is pending urinalysis as well as I will add on a urine sodium and creatinine.  Patient is noted to have bicarb of only 17 with low anion gap.  I will treat the patient with oral sodium bicarbonate.  This evening.

## 2023-02-26 NOTE — Assessment & Plan Note (Signed)
As per review of the oncology note on December 07, 2022, patient has diagnosis of anemia due to chronic kidney disease.  However they are wanting to hold off on giving patient erythropoietin treatment as patient is pending rule out of lung cancer.  Review of imaging indicates that on Jul 28, 2022 patient had 1.9 x 1.6 cm nodule in the posterior right upper lobe that was bigger than previously measured and was consistent with a slowly enlarging indolent adenocarcinoma.  Therefore patient is apparently only being treated with ferrous sulfate till she has a repeat CAT scan done in January of next year.  Today patient is felt to have her hemoglobin at 7.6, which is worse than her typical baseline of at least 8.0 hemoglobin.  Patient is ordered for 1 unit of PRBC.  I think we will need expert input with regards to the chronic management of this anemia of chronic kidney disease still January when malignancy is conclusively ruled out or confirmed.  For now patient will get 1 unit of PRBC this evening, will monitor response.  See concern for GI bleeding below

## 2023-02-26 NOTE — Assessment & Plan Note (Signed)
Is a chronically documented diagnosis in patient's chart.  Further review of oncology note indicates patient has had malignant neoplasm of supraglottis.

## 2023-02-26 NOTE — H&P (Signed)
History and Physical    Patient: Anita Pollard ZDG:[id number] DOB: 1940/08/22 DOA: 02/26/2023 DOS: the patient was seen and examined on 02/26/2023 PCP: Anita Floro, MD  Patient coming from: Home  Chief Complaint:  Chief Complaint  Patient presents with   Rectal Bleeding   HPI: Anita Pollard is a 82 y.o. female with medical history significant of renal txp, breast ca, immunosupression.   Patient reports being in her usual state of healht till about 10 days ago when she reports a new onset of having 3-4 loose bowel movements per day.  This was associated with episodes of bright red blood per rectum that was generally after she would have a bowel movement.  However she would also notice when she would use the bathroom to urinate that the pad had some blood spotting in the undergarment.  Patient also reports starting about 4 days ago getting progressively worse generalized weakness so much so that she was unable to push back the recliner when she laid on the recliner and had to sleep on the sofa.  She also reports being off balance accordingly.  She does not report any new shortness of breath chest pain.  No presyncope fall or trauma.  No other site of bleeding such as gum bleeding skin bruising or nosebleeding.  Patient has actually had no diarrhea today and no bowel movement today.  And therefore she is not aware if she has had any bleeding.  However she further reports that she has also not had any micturition today.  Patient finally came to the ER because of marked generalized weakness.  Patient is found to have anemia in the ER that is worse than her chronic baseline.  Patient is ordered for 1 unit of PRBC transfusion.  Medical evaluation is sought.    Review of Systems: As mentioned in the history of present illness. All other systems reviewed and are negative. Past Medical History:  Diagnosis Date   Alcohol abuse    Anemia    Arthritis    Breast cancer (HCC) 2010   Right    Carotid artery disease (HCC)    Chronic heart failure with preserved ejection fraction (HFpEF) (HCC)    Chronic kidney disease    Colon polyp    Depression    Diabetes mellitus    GERD (gastroesophageal reflux disease)    Heart murmur    History of kidney stones 1976   HTN (hypertension)    Hypothyroidism    Osteopenia    Pancreatitis    Ulcerative esophagitis    Past Surgical History:  Procedure Laterality Date   ABDOMINAL HYSTERECTOMY     ABDOMINOPLASTY     APPENDECTOMY     BREAST BIOPSY Right 08/03/2008   Stereo Bx, Malignant   BREAST LUMPECTOMY Right 08/26/2008   CATARACT EXTRACTION W/ INTRAOCULAR LENS  IMPLANT, BILATERAL     CO2 LASER APPLICATION N/A 01/09/2020   Procedure: CO2 LASER APPLICATION;  Surgeon: Christia Reading, MD;  Location: [facility] OR;  Service: ENT;  Laterality: N/A;   COSMETIC SURGERY     ductal carcinoma     right breast    EYE SURGERY     HIP ARTHROPLASTY Right    INSERTION OF DIALYSIS CATHETER Right 09/25/2012   Procedure: INSERTION OF DIALYSIS CATHETER;  Surgeon: Pryor Ochoa, MD;  Location: [facility] OR;  Service: Vascular;  Laterality: Right;  Righ Internal Jugular Placement   IR GASTROSTOMY TUBE MOD SED  02/10/2020   IR GASTROSTOMY TUBE REMOVAL  05/25/2020   JOINT REPLACEMENT     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   MICROLARYNGOSCOPY N/A 10/20/2019   Procedure: Suspended Microlaryngoscopy with Biopsy;  Surgeon: Christia Reading, MD;  Location: [facility] OR;  Service: ENT;  Laterality: N/A;   MICROLARYNGOSCOPY N/A 01/09/2020   Procedure: MICROLARYNGOSCOPY WITH BIOPSY;  Surgeon: Christia Reading, MD;  Location[facility] OR;  Service: ENT;  Laterality: N/A;   RIGID ESOPHAGOSCOPY N/A 10/20/2019   Procedure: RIGID ESOPHAGOSCOPY;  Surgeon: Christia Reading, MD;  Location: [facility] OR;  Service: ENT;  Laterality: N/A;   THYROIDECTOMY     TUMMY TUCK     Social History:  reports that she quit smoking about 39 years ago. Her smoking use included cigarettes. She has never used smokeless tobacco.  She reports current alcohol use. She reports that she does not use drugs.  Allergies  Allergen Reactions   Penicillins Itching and Swelling    Swelling and flushing Has patient had a PCN reaction causing immediate rash, facial/tongue/throat swelling, SOB or lightheadedness with hypotension: yes Has patient had a PCN reaction causing severe rash involving mucus membranes or skin necrosis: no Has patient had a PCN reaction that required hospitalization: no Has patient had a PCN reaction occurring within the last 10 years: no If all of the above answers are "NO", then may proceed with Cephalosporin use.   Banana Nausea Only    Green bananas    Detrol [Tolterodine]     Leg cramps   Quetiapine Other (See Comments)    Leg cramps/restless leg   Risperidone And Related     Unknown reaction   Lisinopril Cough    Family History  Problem Relation Age of Onset   Hypertension Mother    Diabetes Father    Hypertension Father    Hypertension Sister    Dementia Sister    Multiple myeloma Sister    Thyroid cancer Daughter 27   Diabetes Brother    Colon polyps Brother    Atrial fibrillation Brother    Breast cancer Neg Hx     Prior to Admission medications   Medication Sig Start Date End Date Taking? Authorizing Provider  carvedilol (COREG) 12.5 MG tablet Take 25 mg by mouth 2 (two) times daily. 05/04/22   [provider]  Cholecalciferol (VITAMIN D3) 5000 UNITS TABS Take 5,000 Units by mouth 3 (three) times daily after meals.     [provider]  CREON 3000-9500 units CPEP Take 3 capsules by mouth in the morning, at noon, and at bedtime. 01/07/15   [provider]  ferrous sulfate 324 MG TBEC Take 324 mg by mouth 2 (two) times daily. Takes    [provider]  glipiZIDE (GLUCOTROL XL) 2.5 MG 24 hr tablet Take 5 mg by mouth 2 (two) times daily.  01/01/14   [provider]  insulin glargine, 1 Unit Dial, (TOUJEO SOLOSTAR) 300 UNIT/ML Solostar Pen  Inject 5-7 Units into the skin daily. 09/02/20   [provider]  Lancets (FREESTYLE) lancets by Does not apply route.    [provider]  levothyroxine (SYNTHROID) 175 MCG tablet Take 175 mcg by mouth daily before breakfast. 6 days a week, does not take on sundays    [provider]  Melatonin 5 MG CAPS Take 5 mg by mouth See admin instructions. Take 5 mg 2 hours before bedtime    [provider]  Multiple Vitamins-Minerals (ICAPS AREDS 2 PO) Take 1 tablet by mouth 2 (two) times daily.    [provider]  omeprazole (PRILOSEC) 20 MG capsule Take 20 mg by mouth daily.    [provider]  Polyethyl Glycol-Propyl Glycol (SYSTANE OP) Place 1 drop into both eyes every evening.    [provider]  potassium chloride (MICRO-K) 10 MEQ CR capsule Take 20 mEq by mouth 2 (two) times daily. 20mg  in the am 10mg  in the afternoon and 10mg  at night 05/24/21   [provider]  pramipexole (MIRAPEX) 0.25 MG tablet Take 0.25-0.5 mg by mouth See admin instructions. Take 0.5 mg 2 hours prior to bedtime then 0.25 mg at bedtime    [provider]  predniSONE (DELTASONE) 10 MG tablet Take 5 mg by mouth daily with breakfast.    [provider]  rosuvastatin (CRESTOR) 5 MG tablet Take 1 tablet (5 mg total) by mouth 2 (two) times a week. Take on Mondays and Thursdays. 02/10/21   [name], MD  sulfamethoxazole-trimethoprim (BACTRIM) 400-80 MG tablet Take 1 tablet by mouth 3 (three) times a week. Monday Wednesday friday 12/25/14 04/18/23  [provider]  tacrolimus (PROGRAF) 0.5 MG capsule Take 2 mg by mouth every 12 (twelve) hours.    [provider]  temazepam (RESTORIL) 15 MG capsule Take 15-30 mg by mouth at bedtime.  05/20/13   [provider]  torsemide (DEMADEX) 20 MG tablet Take 3 tablets (60 mg total) by mouth daily. 07/02/22   Joseph Art, DO  vitamin B-12 (CYANOCOBALAMIN) 500 MCG tablet Take 500 mcg  by mouth daily.    [provider]    Physical Exam: Vitals:   02/26/23 1413 02/26/23 1839 02/26/23 1900 02/26/23 1920  BP:   (!) 171/72 (!) 151/58  Pulse:      Resp:   15 16  Temp:  98 F (36.7 C) 98 F (36.7 C) 98 F (36.7 C)  TempSrc:      SpO2:   99% 99%  Weight: 54.4 kg     Height: 4\' 11"  (1.499 m)      General: Patient seems alert and awake no distress, gives a coherent account of her symptoms Respiratory exam: Bilateral air entry vesicular Cardiovascular exam S1-S2 normal Abdomen: Bowel sounds are normal all quadrants are soft and nontender Nurse technician Morrie Sheldon helped with rectal/pelvic exam.  There is no obvious bleeding on the pads when the patient removed her undergarments as per the technician.  There was no blood evidence on external genitalia.  When patient coughed looking at the left side there was no bleeding from the intervulvar space.  No bleeding on external rectal exam, brown stool in rectal vault.  No evidence of melena or bright red blood per rectum. Extremities - no focal deficit. Warm , trace edema. Data Reviewed:  Labs on Admission:  Results for orders placed or performed during the hospital encounter of 02/26/23 (from the past 24 hour(s))  CBC with Differential     Status: Abnormal   Collection Time: 02/26/23  2:32 PM  Result Value Ref Range   WBC 5.7 4.0 - 10.5 K/uL   RBC 2.15 (L) 3.87 - 5.11 MIL/uL   Hemoglobin 7.6 (L) 12.0 - 15.0 g/dL   HCT 46.9 (L) 62.9 - 52.8 %   MCV 116.7 (H) 80.0 - 100.0 fL   MCH 35.3 (H) 26.0 - 34.0 pg   MCHC 30.3 30.0 - 36.0 g/dL   RDW 41.3 24.4 - 01.0 %   Platelets 205 150 - 400 K/uL   nRBC 0.0 0.0 - 0.2 %  Neutrophils Relative % 70 %   Neutro Abs 4.0 1.7 - 7.7 K/uL   Lymphocytes Relative 18 %   Lymphs Abs 1.1 0.7 - 4.0 K/uL   Monocytes Relative 8 %   Monocytes Absolute 0.5 0.1 - 1.0 K/uL   Eosinophils Relative 3 %   Eosinophils Absolute 0.2 0.0 - 0.5 K/uL   Basophils Relative 0 %   Basophils Absolute  0.0 0.0 - 0.1 K/uL   Immature Granulocytes 1 %   Abs Immature Granulocytes 0.06 0.00 - 0.07 K/uL  Comprehensive metabolic panel     Status: Abnormal   Collection Time: 02/26/23  2:41 PM  Result Value Ref Range   Sodium 136 135 - 145 mmol/L   Potassium 4.6 3.5 - 5.1 mmol/L   Chloride 116 (H) 98 - 111 mmol/L   CO2 17 (L) 22 - 32 mmol/L   Glucose, Bld 152 (H) 70 - 99 mg/dL   BUN 66 (H) 8 - 23 mg/dL   Creatinine, Ser 1.02 (H) 0.44 - 1.00 mg/dL   Calcium 8.1 (L) 8.9 - 10.3 mg/dL   Total Protein 6.4 (L) 6.5 - 8.1 g/dL   Albumin 3.3 (L) 3.5 - 5.0 g/dL   AST 14 (L) 15 - 41 U/L   ALT 15 0 - 44 U/L   Alkaline Phosphatase 48 38 - 126 U/L   Total Bilirubin 0.5 <1.2 mg/dL   GFR, Estimated 15 (L) >60 mL/min   Anion gap 3 (L) 5 - 15  Type and screen [facility]     Status: None (Preliminary result)   Collection Time: 02/26/23  2:41 PM  Result Value Ref Range   ABO/RH(D) A POS    Antibody Screen NEG    Sample Expiration 03/01/2023,2359    Unit Number [id number]    Blood Component Type RED CELLS,LR    Unit division 00    Status of Unit ISSUED    Transfusion Status OK TO TRANSFUSE    Crossmatch Result      Compatible Performed at [facility], [address] [place], [place] [address]   Prepare RBC (crossmatch)     Status: None   Collection Time: 02/26/23  5:44 PM  Result Value Ref Range   Order Confirmation      ORDER PROCESSED BY BLOOD BANK Performed at [facility], [address], [place], [place] [address]   CBG monitoring, ED     Status: Abnormal   Collection Time: 02/26/23  6:44 PM  Result Value Ref Range   Glucose-Capillary 124 (H) 70 - 99 mg/dL   Basic Metabolic Panel: Recent Labs  Lab 02/26/23 1441  NA 136  K 4.6  CL 116*  CO2 17*  GLUCOSE 152*  BUN 66*  CREATININE 2.91*  CALCIUM 8.1*   Liver Function Tests: Recent Labs  Lab 02/26/23 1441  AST 14*  ALT 15  ALKPHOS 48  BILITOT 0.5  PROT 6.4*   ALBUMIN 3.3*   No results for input(s): "LIPASE", "AMYLASE" in the last 168 hours. No results for input(s): "AMMONIA" in the last 168 hours. CBC: Recent Labs  Lab 02/26/23 1432  WBC 5.7  NEUTROABS 4.0  HGB 7.6*  HCT 25.1*  MCV 116.7*  PLT 205   Cardiac Enzymes: No results for input(s): "CKTOTAL", "CKMB", "CKMBINDEX", "TROPONINIHS" in the last 168 hours.  BNP (last 3 results) No results for input(s): "PROBNP" in the last 8760 hours. CBG: Recent Labs  Lab 02/26/23 1844  GLUCAP 124*  Radiological Exams on Admission:  No results found.  EKG: Independently reviewed. NSR   Assessment and Plan: * Anemia As per review of the oncology note on December 07, 2022, patient has diagnosis of anemia due to chronic kidney disease.  However they are wanting to hold off on giving patient erythropoietin treatment as patient is pending rule out of lung cancer.  Review of imaging indicates that on Jul 28, 2022 patient had 1.9 x 1.6 cm nodule in the posterior right upper lobe that was bigger than previously measured and was consistent with a slowly enlarging indolent adenocarcinoma.  Therefore patient is apparently only being treated with ferrous sulfate till she has a repeat CAT scan done in January of next year.  Today patient is felt to have her hemoglobin at 7.6, which is worse than her typical baseline of at least 8.0 hemoglobin.  Patient is ordered for 1 unit of PRBC.  I think we will need expert input with regards to the chronic management of this anemia of chronic kidney disease still January when malignancy is conclusively ruled out or confirmed.  For now patient will get 1 unit of PRBC this evening, will monitor response.  See concern for GI bleeding below  GI bleeding Reports having bright red blood per rectum ongoing during bowel movements and otherwise for the last 9 or 10 days.  However she has had no bowel movement today.  It is noted that the patient's rectal vault has only  bowel soft brown stools and no evidence of ongoing bleeding at this time.  Avoid anticoagulation at this time.  This is most consistent with a lower GI source of bleeding per history which has remitted at this time.  Patient was offered a GI referral by ER attending and by the author.  However patient at this time reports that she sees Dr. Lanell Matar of [facility] as an outpatient.  And she would prefer to follow-up with him.  We will monitor the patient clinically at this time.  I will change the patient's diet to clear liquid while monitoring the patient in the hospital.  Elevated serum creatinine And has documented chronic kidney disease as well as prior history of renal transplant.  Patient's baseline creatinine around 2.5.  Noted to have a creatinine of 2.91 today.  Which may represent an AKI.  Although blood urea is only 66 and was 60 and 71 on prior 2 occasions.  Given patient's history of multiple loose bowel movements in the last 10 days, this is most consistent with prerenal AKI possible.  Will treat with fluid bolus today.  Patient is pending urinalysis as well as I will add on a urine sodium and creatinine.  Patient is noted to have bicarb of only 17 with low anion gap.  I will treat the patient with oral sodium bicarbonate.  This evening.  Breast cancer [facility]) Is a chronically documented diagnosis in patient's chart.  Further review of oncology note indicates patient has had malignant neoplasm of supraglottis.   Home medication reconciliation pending.   Advance Care Planning:   Code Status: Full Code   Consults: none at this time. Patinet intendds to follow up with GI as outaptient. Certinaly if patient changes mind, we could have GI see patinet in hospital  Family Communication: per patient.  Severity of Illness: The appropriate patient status for this patient is OBSERVATION. Observation status is judged to be reasonable and necessary in order to provide the required intensity of  service to ensure  the patient's safety. The patient's presenting symptoms, physical exam findings, and initial radiographic and laboratory data in the context of their medical condition is felt to place them at decreased risk for further clinical deterioration. Furthermore, it is anticipated that the patient will be medically stable for discharge from the hospital within 2 midnights of admission.   Author: Nolberto Hanlon, MD 02/26/2023 7:40 PM  For on call review [email or web address].

## 2023-02-26 NOTE — ED Notes (Signed)
ED TO INPATIENT HANDOFF REPORT  Name/Age/Gender Anita Pollard 82 y.o. female  Code Status    Code Status Orders  (From admission, onward)           Start     Ordered   02/26/23 1806  Full code  Continuous       Question:  By:  Answer:  Other   02/26/23 1806           Code Status History     Date Active Date Inactive Code Status Order ID Comments User Context   06/29/2022 2236 07/02/2022 1446 Full Code [id number]  Maryln Gottron, MD ED   09/24/2012 1744 10/08/2012 1657 Full Code [id number]  Catarina Hartshorn, MD Inpatient       Home/SNF/Other Home  Chief Complaint Anemia [D64.9]  Level of Care/Admitting Diagnosis ED Disposition     ED Disposition  Admit   Condition  --   Comment  Hospital Area: [facility] [[id number]]  Level of Care: Telemetry [5]  Admit to tele based on following criteria: Acute CHF  May admit patient to [facility] or [facility] if equivalent level of care is available:: No  Covid Evaluation: Asymptomatic - no recent exposure (last 10 days) testing not required  Diagnosis: Anemia [643329]  Admitting Physician: Nolberto Hanlon [[id number]]  Attending Physician: Nolberto Hanlon [[id number]]  Certification:: I certify this patient will need inpatient services for at least 2 midnights  Expected Medical Readiness: 02/28/2023          Medical History Past Medical History:  Diagnosis Date   Alcohol abuse    Anemia    Arthritis    Breast cancer (HCC) 2010   Right   Carotid artery disease (HCC)    Chronic heart failure with preserved ejection fraction (HFpEF) (HCC)    Chronic kidney disease    Colon polyp    Depression    Diabetes mellitus    GERD (gastroesophageal reflux disease)    Heart murmur    History of kidney stones 1976   HTN (hypertension)    Hypothyroidism    Osteopenia    Pancreatitis    Ulcerative esophagitis     Allergies Allergies  Allergen Reactions   Penicillins Itching and Swelling    Swelling and  flushing Has patient had a PCN reaction causing immediate rash, facial/tongue/throat swelling, SOB or lightheadedness with hypotension: yes Has patient had a PCN reaction causing severe rash involving mucus membranes or skin necrosis: no Has patient had a PCN reaction that required hospitalization: no Has patient had a PCN reaction occurring within the last 10 years: no If all of the above answers are "NO", then may proceed with Cephalosporin use.   Banana Nausea Only    Green bananas    Detrol [Tolterodine]     Leg cramps   Quetiapine Other (See Comments)    Leg cramps/restless leg   Risperidone And Related     Unknown reaction   Lisinopril Cough    IV Location/Drains/Wounds Patient Lines/Drains/Airways Status     Active Line/Drains/Airways     Name Placement date Placement time Site Days   Peripheral IV 02/26/23 20 G Right Antecubital 02/26/23  1731  Antecubital  less than 1            Labs/Imaging Results for orders placed or performed during the hospital encounter of 02/26/23 (from the past 48 hour(s))  CBC with Differential     Status: Abnormal   Collection Time: 02/26/23  2:32  PM  Result Value Ref Range   WBC 5.7 4.0 - 10.5 K/uL   RBC 2.15 (L) 3.87 - 5.11 MIL/uL   Hemoglobin 7.6 (L) 12.0 - 15.0 g/dL   HCT 78.2 (L) 95.6 - 21.3 %   MCV 116.7 (H) 80.0 - 100.0 fL   MCH 35.3 (H) 26.0 - 34.0 pg   MCHC 30.3 30.0 - 36.0 g/dL   RDW 08.6 57.8 - 46.9 %   Platelets 205 150 - 400 K/uL   nRBC 0.0 0.0 - 0.2 %   Neutrophils Relative % 70 %   Neutro Abs 4.0 1.7 - 7.7 K/uL   Lymphocytes Relative 18 %   Lymphs Abs 1.1 0.7 - 4.0 K/uL   Monocytes Relative 8 %   Monocytes Absolute 0.5 0.1 - 1.0 K/uL   Eosinophils Relative 3 %   Eosinophils Absolute 0.2 0.0 - 0.5 K/uL   Basophils Relative 0 %   Basophils Absolute 0.0 0.0 - 0.1 K/uL   Immature Granulocytes 1 %   Abs Immature Granulocytes 0.06 0.00 - 0.07 K/uL    Comment: Performed at [facility], [address], [place], [place] [address]  Comprehensive metabolic panel     Status: Abnormal   Collection Time: 02/26/23  2:41 PM  Result Value Ref Range   Sodium 136 135 - 145 mmol/L   Potassium 4.6 3.5 - 5.1 mmol/L   Chloride 116 (H) 98 - 111 mmol/L   CO2 17 (L) 22 - 32 mmol/L   Glucose, Bld 152 (H) 70 - 99 mg/dL    Comment: Glucose reference range applies only to samples taken after fasting for at least 8 hours.   BUN 66 (H) 8 - 23 mg/dL   Creatinine, Ser 8.41 (H) 0.44 - 1.00 mg/dL   Calcium 8.1 (L) 8.9 - 10.3 mg/dL   Total Protein 6.4 (L) 6.5 - 8.1 g/dL   Albumin 3.3 (L) 3.5 - 5.0 g/dL   AST 14 (L) 15 - 41 U/L   ALT 15 0 - 44 U/L   Alkaline Phosphatase 48 38 - 126 U/L   Total Bilirubin 0.5 <1.2 mg/dL   GFR, Estimated 15 (L) >60 mL/min    Comment: (NOTE) Calculated using the CKD-EPI Creatinine Equation (2021)    Anion gap 3 (L) 5 - 15    Comment: Performed at [facility], [address], [place], [place] [address]  Type and screen [facility]     Status: None (Preliminary result)   Collection Time: 02/26/23  2:41 PM  Result Value Ref Range   ABO/RH(D) A POS    Antibody Screen NEG    Sample Expiration 03/01/2023,2359    Unit Number [id number]    Blood Component Type RED CELLS,LR    Unit division 00    Status of Unit ISSUED    Transfusion Status OK TO TRANSFUSE    Crossmatch Result      Compatible Performed at [facility], [address] [place], [place] [address]   Prepare RBC (crossmatch)     Status: None   Collection Time: 02/26/23  5:44 PM  Result Value Ref Range   Order Confirmation      ORDER PROCESSED BY BLOOD BANK Performed at [facility], [address], [place], [place] [address]   CBG monitoring, ED     Status: Abnormal   Collection Time: 02/26/23  6:44 PM  Result Value Ref Range   Glucose-Capillary 124 (H) 70 -  99 mg/dL    Comment: Glucose reference range applies only to  samples taken after fasting for at least 8 hours.   No results found.  Pending Labs Unresulted Labs (From admission, onward)     Start     Ordered   02/27/23 0500  APTT  Tomorrow morning,   R        02/26/23 1806   02/27/23 0500  Protime-INR  Tomorrow morning,   R        02/26/23 1806   02/27/23 0500  Basic metabolic panel  Tomorrow morning,   R        02/26/23 1806   02/27/23 0500  CBC  Tomorrow morning,   R        02/26/23 1806   02/26/23 1747  Tacrolimus level  Once,   URGENT        02/26/23 1746   02/26/23 1747  TSH  Once,   URGENT        02/26/23 1746   02/26/23 1746  Urinalysis, w/ Reflex to Culture (Infection Suspected) -Urine, Clean Catch  Once,   URGENT       Question:  Specimen Source  Answer:  Urine, Clean Catch   02/26/23 1746            Vitals/Pain Today's Vitals   02/26/23 1410 02/26/23 1413 02/26/23 1839 02/26/23 1900  BP: 110/60   (!) 171/72  Pulse: 63     Resp: 18   15  Temp: (!) 97.3 F (36.3 C)  98 F (36.7 C) 98 F (36.7 C)  TempSrc: Oral     SpO2: 99%   99%  Weight:  54.4 kg    Height:  4\' 11"  (1.499 m)    PainSc:  0-No pain      Isolation Precautions No active isolations  Medications Medications  0.9 %  sodium chloride infusion (Manually program via Guardrails IV Fluids) (has no administration in time range)  acetaminophen (TYLENOL) tablet 650 mg (has no administration in time range)    Or  acetaminophen (TYLENOL) suppository 650 mg (has no administration in time range)  polyethylene glycol (MIRALAX / GLYCOLAX) packet 17 g (has no administration in time range)  sodium chloride flush (NS) 0.9 % injection 3 mL (has no administration in time range)    Mobility walks with person assist

## 2023-02-26 NOTE — ED Notes (Signed)
Pt requested ONLY MANUAL B/P. No monitor for b/p. Only b/p

## 2023-02-26 NOTE — ED Provider Triage Note (Signed)
Emergency Medicine Provider Triage Evaluation Note  KHAMIYAH HUCKLEBY , a 82 y.o. female  was evaluated in triage.  Pt complains of rectal bleeding. Report increased BM and BRBPR x 9 days.  Feels weak, tired, and having mild pain to buttock.  No n/v/d or dysuria.  Not on blood thinner  Review of Systems  Positive: As above Negative: As above  Physical Exam  BP 110/60 (BP Location: Left Arm)   Pulse 63   Temp (!) 97.3 F (36.3 C) (Oral)   Resp 18   Ht 4\' 11"  (1.499 m)   Wt 54.4 kg   SpO2 99%   BMI 24.24 kg/m  Gen:   Awake, no distress   Resp:  Normal effort  MSK:   Moves extremities without difficulty  Other:    Medical Decision Making  Medically screening exam initiated at 2:31 PM.  Appropriate orders placed.  ALEATHEA VILLARI was informed that the remainder of the evaluation will be completed by another provider, this initial triage assessment does not replace that evaluation, and the importance of remaining in the ED until their evaluation is complete.     Fayrene Helper, PA-C 02/26/23 1432

## 2023-02-27 DIAGNOSIS — D649 Anemia, unspecified: Secondary | ICD-10-CM | POA: Diagnosis not present

## 2023-02-27 DIAGNOSIS — R7989 Other specified abnormal findings of blood chemistry: Secondary | ICD-10-CM | POA: Insufficient documentation

## 2023-02-27 LAB — BPAM RBC
Blood Product Expiration Date: 202412272359
ISSUE DATE / TIME: 202412021856
Unit Type and Rh: 6200

## 2023-02-27 LAB — URINALYSIS, W/ REFLEX TO CULTURE (INFECTION SUSPECTED)
Bilirubin Urine: NEGATIVE
Glucose, UA: NEGATIVE mg/dL
Hgb urine dipstick: NEGATIVE
Ketones, ur: NEGATIVE mg/dL
Nitrite: POSITIVE — AB
Protein, ur: NEGATIVE mg/dL
Specific Gravity, Urine: 1.011 (ref 1.005–1.030)
WBC, UA: >50 WBC/hpf (ref 0–5)
pH: 5 (ref 5.0–8.0)

## 2023-02-27 LAB — TYPE AND SCREEN
ABO/RH(D): A POS
Antibody Screen: NEGATIVE
Unit division: 0

## 2023-02-27 LAB — BASIC METABOLIC PANEL
Anion gap: 10 (ref 5–15)
BUN: 49 mg/dL — ABNORMAL HIGH (ref 8–23)
CO2: 16 mmol/L — ABNORMAL LOW (ref 22–32)
Calcium: 8.5 mg/dL — ABNORMAL LOW (ref 8.9–10.3)
Chloride: 114 mmol/L — ABNORMAL HIGH (ref 98–111)
Creatinine, Ser: 2.19 mg/dL — ABNORMAL HIGH (ref 0.44–1.00)
GFR, Estimated: 22 mL/min — ABNORMAL LOW (ref >60)
Glucose, Bld: 167 mg/dL — ABNORMAL HIGH (ref 70–99)
Potassium: 4.3 mmol/L (ref 3.5–5.1)
Sodium: 140 mmol/L (ref 135–145)

## 2023-02-27 LAB — SODIUM, URINE, RANDOM: Sodium, Ur: 59 mmol/L

## 2023-02-27 LAB — CBC
HCT: 29.1 % — ABNORMAL LOW (ref 36.0–46.0)
Hemoglobin: 9.7 g/dL — ABNORMAL LOW (ref 12.0–15.0)
MCH: 34.4 pg — ABNORMAL HIGH (ref 26.0–34.0)
MCHC: 33.3 g/dL (ref 30.0–36.0)
MCV: 103.2 fL — ABNORMAL HIGH (ref 80.0–100.0)
Platelets: 196 10*3/uL (ref 150–400)
RBC: 2.82 MIL/uL — ABNORMAL LOW (ref 3.87–5.11)
RDW: 17.4 % — ABNORMAL HIGH (ref 11.5–15.5)
WBC: 6.4 10*3/uL (ref 4.0–10.5)
nRBC: 0 % (ref 0.0–0.2)

## 2023-02-27 LAB — HEMOGLOBIN A1C
Hgb A1c MFr Bld: 6.8 % — ABNORMAL HIGH (ref 4.8–5.6)
Mean Plasma Glucose: 148.46 mg/dL

## 2023-02-27 LAB — GLUCOSE, CAPILLARY
Glucose-Capillary: 134 mg/dL — ABNORMAL HIGH (ref 70–99)
Glucose-Capillary: 167 mg/dL — ABNORMAL HIGH (ref 70–99)

## 2023-02-27 LAB — CREATININE, URINE, RANDOM: Creatinine, Urine: 78 mg/dL

## 2023-02-27 LAB — APTT: aPTT: 29 s (ref 24–36)

## 2023-02-27 LAB — PROTIME-INR
INR: 1.1 (ref 0.8–1.2)
Prothrombin Time: 14.3 s (ref 11.4–15.2)

## 2023-02-27 MED ORDER — TACROLIMUS 1 MG PO CAPS
2.0000 mg | ORAL_CAPSULE | Freq: Every day | ORAL | Status: DC
  Administered 2023-02-27: 2 mg via ORAL
  Filled 2023-02-27: qty 2

## 2023-02-27 MED ORDER — FERROUS SULFATE 325 (65 FE) MG PO TABS: 325.0000 mg | ORAL_TABLET | Freq: Every day | ORAL | Status: DC

## 2023-02-27 MED ORDER — CARVEDILOL 12.5 MG PO TABS
12.5000 mg | ORAL_TABLET | Freq: Every day | ORAL | Status: DC
  Administered 2023-02-27: 12.5 mg via ORAL
  Filled 2023-02-27: qty 1

## 2023-02-27 MED ORDER — SULFAMETHOXAZOLE-TRIMETHOPRIM 400-80 MG PO TABS: 1.0000 | ORAL_TABLET | ORAL | Status: DC

## 2023-02-27 MED ORDER — PANCRELIPASE (LIP-PROT-AMYL) 3000-9500 UNITS PO CPEP: 2.0000 | ORAL_CAPSULE | Freq: Three times a day (TID) | ORAL | Status: DC

## 2023-02-27 MED ORDER — CYANOCOBALAMIN 500 MCG PO TABS
500.0000 ug | ORAL_TABLET | Freq: Every day | ORAL | Status: DC
  Administered 2023-02-27: 500 ug via ORAL
  Filled 2023-02-27: qty 1

## 2023-02-27 MED ORDER — PANCRELIPASE (LIP-PROT-AMYL) 12000-38000 UNITS PO CPEP: 12000.0000 [IU] | ORAL_CAPSULE | Freq: Three times a day (TID) | ORAL | Status: DC

## 2023-02-27 MED ORDER — POTASSIUM CHLORIDE CRYS ER 10 MEQ PO TBCR
10.0000 meq | EXTENDED_RELEASE_TABLET | Freq: Every day | ORAL | Status: DC
  Administered 2023-02-27: 10 meq via ORAL
  Filled 2023-02-27: qty 1

## 2023-02-27 MED ORDER — TACROLIMUS 1 MG PO CAPS: 1.0000 mg | ORAL_CAPSULE | Freq: Every day | ORAL | Status: DC

## 2023-02-27 MED ORDER — PREDNISONE 5 MG PO TABS
5.0000 mg | ORAL_TABLET | Freq: Every day | ORAL | Status: DC
  Administered 2023-02-27: 5 mg via ORAL
  Filled 2023-02-27: qty 1

## 2023-02-27 MED ORDER — CARVEDILOL 25 MG PO TABS
25.0000 mg | ORAL_TABLET | Freq: Every day | ORAL | Status: DC
  Administered 2023-02-27: 25 mg via ORAL
  Filled 2023-02-27: qty 1

## 2023-02-27 MED ORDER — LEVOTHYROXINE SODIUM 25 MCG PO TABS: 175.0000 ug | ORAL_TABLET | ORAL | Status: DC

## 2023-02-27 MED ORDER — PANTOPRAZOLE SODIUM 40 MG PO TBEC
40.0000 mg | DELAYED_RELEASE_TABLET | Freq: Every day | ORAL | Status: DC
  Administered 2023-02-27: 40 mg via ORAL
  Filled 2023-02-27: qty 1

## 2023-02-27 MED ORDER — TORSEMIDE 20 MG PO TABS
60.0000 mg | ORAL_TABLET | Freq: Every day | ORAL | Status: DC
  Administered 2023-02-27: 60 mg via ORAL
  Filled 2023-02-27: qty 3

## 2023-02-27 NOTE — Plan of Care (Signed)
No rectal bleeding overnight. Problem: Education: Goal: Knowledge of General Education information will improve Description: Including pain rating scale, medication(s)/side effects and non-pharmacologic comfort measures Outcome: Progressing   Problem: Health Behavior/Discharge Planning: Goal: Ability to manage health-related needs will improve Outcome: Progressing   Problem: Clinical Measurements: Goal: Ability to maintain clinical measurements within normal limits will improve Outcome: Progressing Goal: Will remain free from infection Outcome: Progressing Goal: Diagnostic test results will improve Outcome: Progressing Goal: Respiratory complications will improve Outcome: Progressing Goal: Cardiovascular complication will be avoided Outcome: Progressing   Problem: Activity: Goal: Risk for activity intolerance will decrease Outcome: Progressing   Problem: Nutrition: Goal: Adequate nutrition will be maintained Outcome: Progressing   Problem: Coping: Goal: Level of anxiety will decrease Outcome: Progressing   Problem: Elimination: Goal: Will not experience complications related to bowel motility Outcome: Progressing Goal: Will not experience complications related to urinary retention Outcome: Progressing   Problem: Pain Management: Goal: General experience of comfort will improve Outcome: Progressing   Problem: Safety: Goal: Ability to remain free from injury will improve Outcome: Progressing   Problem: Skin Integrity: Goal: Risk for impaired skin integrity will decrease Outcome: Progressing   Problem: Education: Goal: Ability to describe self-care measures that may prevent or decrease complications (Diabetes Survival Skills Education) will improve Outcome: Progressing Goal: Individualized Educational Video(s) Outcome: Progressing   Problem: Coping: Goal: Ability to adjust to condition or change in health will improve Outcome: Progressing   Problem: Fluid  Volume: Goal: Ability to maintain a balanced intake and output will improve Outcome: Progressing   Problem: Health Behavior/Discharge Planning: Goal: Ability to identify and utilize available resources and services will improve Outcome: Progressing Goal: Ability to manage health-related needs will improve Outcome: Progressing   Problem: Metabolic: Goal: Ability to maintain appropriate glucose levels will improve Outcome: Progressing   Problem: Nutritional: Goal: Maintenance of adequate nutrition will improve Outcome: Progressing Goal: Progress toward achieving an optimal weight will improve Outcome: Progressing   Problem: Skin Integrity: Goal: Risk for impaired skin integrity will decrease Outcome: Progressing   Problem: Tissue Perfusion: Goal: Adequacy of tissue perfusion will improve Outcome: Progressing

## 2023-02-27 NOTE — Care Management Obs Status (Signed)
MEDICARE OBSERVATION STATUS NOTIFICATION   Patient Details  Name: Anita Pollard MRN: [id number] Date of Birth: Aug 23, 1940   Medicare Observation Status Notification Given:  Yes    Otelia Santee, LCSW 02/27/2023, 10:58 AM

## 2023-02-27 NOTE — Discharge Summary (Signed)
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 is an 82 y.o. F with renal failure s/p renal transplant on tacrolimus and prednisone, CKD IV baseline 2.1-2.9, chronic anemia, supraglottic cancer, lung cancer who presented with 1 week red blood and clots in bowel movements, finally weak and fell, could not get up.    Anemia GI bleeding In the ER, rectal exam with brown stool.  Observed overnight and had further brown bowel movement.    Hgb 7.3 in the ER, transfused 1 unit and Hgb up to 9.7 g/dL, above recent baseline ~9 g/dL.  Discussed GI consultation which patient declined.  Suspect source is hemorrhoidal or diverticular, self-limited.    Recommend soft diet for few days.  Follow up Hgb in 1 week.  Follow up with primary gastroenterologist.      Elevated serum creatinine Serum creatinine 2.9 on admission, losartan held and blood given and Cr down to 2.1.  Stable within baseline range, recommend PCP follow up.    Weakness Due to anemia, 82 age.  Patient was independent with self cares, home health was ordered.           [facility] Controlled Substances Registry was reviewed for this patient prior to  discharge.     Disposition: Home health Diet recommendation:  Regular diet  DISCHARGE MEDICATION: Allergies as of 02/27/2023       Reactions   Penicillins Itching, Swelling, Other (See Comments)   Swelling and flushing Has patient had a PCN reaction causing immediate rash, facial/tongue/throat swelling, SOB or lightheadedness with hypotension: yes Has patient had a PCN reaction causing severe rash involving mucus membranes or skin necrosis: no Has patient had a PCN reaction that required hospitalization: no Has patient had a PCN reaction occurring within the last 10 years: no If all of the above answers are "NO", then may proceed with Cephalosporin use.   Banana Nausea Only, Other (See Comments)   Green bananas    Detrol [tolterodine] Other (See Comments)   Leg cramps   Quetiapine Other (See Comments)   Leg cramps/restless legs   Risperidone And Related Other (See Comments)   Unknown reaction   Lisinopril Cough        Medication List     TAKE these medications    carvedilol 12.5 MG tablet Commonly known as: COREG Take 12.5-25 mg by mouth See admin instructions. Take 25 mg by mouth in the morning and 12.5 mg at lunchtime   Creon 3000-9500 units Cpep Generic drug: Pancrelipase (Lip-Prot-Amyl) Take 2-3 capsules by mouth with breakfast, with lunch, and with evening meal.   Dexcom G7 Sensor Misc Inject 1 Device into the skin See admin instructions. Place 1 new  sensor into the skin every 10 days   ferrous sulfate 324 MG Tbec Take 324 mg by mouth in the morning and at bedtime. Takes   freestyle lancets by Does not apply route.   glipiZIDE 2.5 MG 24 hr tablet Commonly known as: GLUCOTROL XL Take 2.5 mg by mouth See admin instructions. Take 2.5 mg by mouth before lunch daily   ICAPS AREDS 2 PO Take 1 capsule by mouth in the morning and at bedtime.   losartan 50 MG tablet Commonly known as: COZAAR Take 50 mg by mouth in the morning.   magnesium oxide 400 (240 Mg)  MG tablet Commonly known as: MAG-OX Take 400 mg by mouth 3 (three) times daily with meals.   Melatonin 5 MG Caps Take 5 mg by mouth See admin instructions. Take 5 mg by mouth two hours before bedtime   multivitamin tablet Take 1 tablet by mouth daily with breakfast.   omeprazole 20 MG capsule Commonly known as: PRILOSEC Take 20 mg by mouth daily before breakfast.   potassium chloride 10 MEQ CR capsule Commonly known as: MICRO-K Take 10-20 mEq by mouth See admin instructions. Take 20 mEq by mouth in the morning, 10 mEq at lunchtime, and 10 mEq with the evening meal   pramipexole 0.25 MG tablet Commonly known as: MIRAPEX Take 0.25-0.5 mg by mouth See admin instructions. Take 0.5 mg by mouth 2 hours prior to bedtime, then 0.25 mg at bedtime   predniSONE 5 MG tablet Commonly known as: DELTASONE Take 5 mg by mouth daily with breakfast.   rosuvastatin 5 MG tablet Commonly known as: CRESTOR Take 1 tablet (5 mg total) by mouth 2 (two) times a week. Take on Mondays and Thursdays. What changed:  when to take this additional instructions   sulfamethoxazole-trimethoprim 400-80 MG tablet Commonly known as: BACTRIM Take 1 tablet by mouth every Monday, Wednesday, and Friday.   Synthroid 175 MCG tablet Generic drug: levothyroxine Take 175 mcg by mouth See admin instructions. Take 175 mcg by mouth between 2-3 AM on Mon/Tues/Wed/Thurs/Fri/Sat   SYSTANE OP Place 1 drop into both eyes every evening.   tacrolimus 1 MG capsule Commonly known as: PROGRAF Take 1-2 mg by mouth See admin instructions. Take 2 mg by mouth in the morning and 1 mg in the evening   temazepam 15 MG capsule Commonly known as: RESTORIL Take 15 mg by mouth See admin instructions. Take 15 mg by mouth at bedtime and an additional 15 mg upon awakening in the middle of the night   torsemide 20 MG tablet Commonly known as: DEMADEX Take 3 tablets (60 mg total) by mouth daily. What changed: when to take this   Toujeo  SoloStar 300 UNIT/ML Solostar Pen Generic drug: insulin glargine (1 Unit Dial) Inject 5-7 Units into the skin daily.   vitamin B-12 500 MCG tablet Commonly known as: CYANOCOBALAMIN Take 500 mcg by mouth daily.   Vitamin D3 125 MCG (5000 UT) Tabs Take 5,000 Units by mouth 3 (three) times daily after meals.        Follow-up Information     Care, [facility] Follow up.   Specialty: Home Health Services Why: Frances Furbish will follow up with you at discharge to provide home health services Contact information: [address] [place] [address] [phone]         Anita Floro, MD. Schedule an appointment as soon as possible for a visit in 1 week(s).   Specialty: Family Medicine Contact information:  [address] [place] [address] [phone]         Truitt Merle, MD Follow up.   Specialty: [facility] Contact information: [address] [place] [address] [phone]         Rachel Moulds, MD Follow up.   Specialty: Pollard and Oncology Contact information: [address] [place] [address] [phone]                 Discharge Instructions     Discharge instructions   Complete by: As directed    **IMPORTANT DISCHARGE INSTRUCTIONS**   From Dr. Maryfrances Bunnell: You were evaluated for rectal bleeding.  Here, the bleeding appears to have stopped, and you had no further bleeding in the last 24 hours.  You were transfused 1 unit of blood and your hemoglobin level has come up appropriately to 9.7 g/dL  I will send the discharge summary to Dr. Sherilyn Banker office and to Dr. Remonia Richter office  Call Anita Pollard to have your blood level checked in 1 week  Continue your iron supplement ferrous sulfate and ask Anita Pollard if any other treatment is needed or possible  Call Dr. Sherilyn Banker office for follow up of the bleeding  Resume all of your other home medicines without  change  Eat a soft low fiber diet only for the next 3 days, then resume a normal diet   Increase activity slowly   Complete by: As directed        Discharge Exam: Filed Weights   02/26/23 1413  Weight: 54.4 kg    General: Pt is alert, awake, not in acute distress, sitting up in bed Cardiovascular: RRR, nl S1-S2, no murmurs appreciated.   No LE edema.   Respiratory: Normal respiratory rate and rhythm.  CTAB without rales or wheezes. Abdominal: Abdomen soft and non-tender.  No distension or HSM.   Neuro/Psych: Strength weak but symmetric in upper and lower extremities.  Judgment and insight appear normal.   Condition at discharge: fair  The results of significant diagnostics from this hospitalization (including imaging, microbiology, ancillary and laboratory) are listed below for reference.   Imaging Studies: CT Head Wo Contrast  Result Date: 02/06/2023 CLINICAL DATA:  Head trauma, minor (Age >= 65y). Fall with head strike. EXAM: CT HEAD WITHOUT CONTRAST TECHNIQUE: Contiguous axial images were obtained from the base of the skull through the vertex without intravenous contrast. RADIATION DOSE REDUCTION: This exam was performed according to the departmental dose-optimization program which includes automated exposure control, adjustment of the mA and/or kV according to patient size and/or use of iterative reconstruction technique. COMPARISON:  Head CT 11/04/2003. FINDINGS: Brain: No acute intracranial hemorrhage. Gray-white differentiation is preserved. No hydrocephalus or extra-axial collection. No mass effect or midline shift. Vascular: No hyperdense vessel or unexpected calcification. Skull: No calvarial fracture or suspicious bone lesion. Skull base is unremarkable. Sinuses/Orbits: Chronic right sphenoid sinusitis. Orbits are unremarkable. Other: Small left parietal scalp contusion. IMPRESSION: 1. No acute intracranial abnormality. 2. Small left parietal scalp contusion. No calvarial  fracture. Electronically Signed   By: Orvan Falconer M.D.   On: 02/06/2023 14:16    Microbiology: Results for orders placed or performed during the hospital encounter of 06/29/22  Urine Culture     Status: Abnormal   Collection Time: 06/30/22  9:50 AM   Specimen: Urine, Random  Result Value Ref Range Status   Specimen Description   Final    URINE, RANDOM Performed at [facility], [address] [place], [place] [address]    Special Requests   Final    NONE Reflexed from [id number] Performed at [facility], [address] [place], [place] [address]    Culture >=100,000 COLONIES/mL ESCHERICHIA COLI (A)  Final   Report Status 07/02/2022 FINAL  Final   Organism ID, Bacteria ESCHERICHIA COLI (A)  Final      Susceptibility   Escherichia coli - MIC*    AMPICILLIN >=32 RESISTANT Resistant     CEFAZOLIN <=4 SENSITIVE Sensitive     CEFEPIME <=0.12 SENSITIVE Sensitive     CEFTRIAXONE <=0.25 SENSITIVE Sensitive     CIPROFLOXACIN <=0.25 SENSITIVE Sensitive     GENTAMICIN >=16 RESISTANT Resistant     IMIPENEM <=0.25 SENSITIVE Sensitive     NITROFURANTOIN <=16 SENSITIVE Sensitive     TRIMETH/SULFA >=320 RESISTANT Resistant     AMPICILLIN/SULBACTAM 16 INTERMEDIATE Intermediate     PIP/TAZO <=4 SENSITIVE Sensitive     * >=100,000 COLONIES/mL ESCHERICHIA COLI    Labs: CBC: Recent Labs  Lab 02/26/23 1432 02/27/23 0541  WBC 5.7 6.4  NEUTROABS 4.0  --   HGB 7.6* 9.7*  HCT 25.1* 29.1*  MCV 116.7* 103.2*  PLT 205 196   Basic Metabolic Panel: Recent Labs  Lab 02/26/23 1441 02/27/23 0541  NA 136 140  K 4.6 4.3  CL 116* 114*  CO2 17* 16*  GLUCOSE 152* 167*  BUN 66* 49*  CREATININE 2.91* 2.19*  CALCIUM 8.1* 8.5*   Liver Function Tests: Recent Labs  Lab 02/26/23 1441  AST 14*  ALT 15  ALKPHOS 48  BILITOT 0.5  PROT 6.4*  ALBUMIN 3.3*   CBG: Recent Labs  Lab 02/26/23 1844 02/26/23 2035 02/27/23 0841 02/27/23 1155  GLUCAP 124*  186* 134* 167*    Discharge time spent: approximately 35 minutes spent on discharge counseling, evaluation of patient on day of discharge, and coordination of discharge planning with nursing, social work, pharmacy and case management  Signed: Alberteen Sam, MD [facility] 02/27/2023

## 2023-02-27 NOTE — Progress Notes (Signed)
[facility] Quality rating ???? Patient survey rating ????   [facility] Quality rating ????? Patient survey rating???   [facility] [phone] Quality rating???? Patient survey rating????   [facility] [phone] Quality rating???? Patient survey rating????   [facility] [phone] Quality rating ???? Patient survey rating???   [facility] Quality rating??? Patient survey rating???   [facility] [phone] Quality rating??? Patient survey rating ????   [facility] [phone] Quality rating ????   [facility] Quality rating ??? Patient survey rating ????   [facility] Quality rating ??? Patient survey rating???   [facility] Quality rating ????? Patient survey rating ???   [facility] [phone] Quality rating ????? Patient survey rating ????

## 2023-02-27 NOTE — Care Management CC44 (Signed)
Condition Code 44 Documentation Completed  Patient Details  Name: Anita Pollard MRN: [id number] Date of Birth: 08-Jan-1941   Condition Code 44 given:  Yes Patient signature on Condition Code 44 notice:  Yes Documentation of 2 MD's agreement:  Yes Code 44 added to claim:  Yes    Otelia Santee, LCSW 02/27/2023, 10:58 AM

## 2023-02-27 NOTE — TOC Transition Note (Signed)
Transition of Care [facility]) - CM/SW Discharge Note   Patient Details  Name: Anita Pollard MRN: [id number] Date of Birth: [birth year]  [facility] CM/SW Contact:  Otelia Santee, LCSW Phone Number: 02/27/2023, 12:18 PM   Clinical Narrative:    Pt agreeable to having HHPT/OT arranged. Pt does not have preference for HHA. HHPT/OT has been arranged with [facility]. No further TOC needs identified. TOC signing off at this time.   Final next level of care: Home w Home Health Services Barriers to Discharge: No Barriers Identified   Patient Goals and CMS Choice CMS Medicare.gov Compare Post Acute Care list provided to:: Patient Choice offered to / list presented to : Patient  Discharge Placement                         Discharge Plan and Services Additional resources added to the After Visit Summary for                  DME Arranged: N/A DME Agency: NA       HH Arranged: PT, OT HH Agency: [facility] Date [facility] Agency Contacted: 02/27/23 Time HH Agency Contacted: 1218 Representative spoke with at [facility] Agency: Cindie  Social Determinants of Health (SDOH) Interventions SDOH Screenings   Food Insecurity: No Food Insecurity (02/27/2023)  Housing: Low Risk  (02/27/2023)  Transportation Needs: No Transportation Needs (02/27/2023)  Utilities: Not At Risk (02/27/2023)  Financial Resource Strain: Low Risk  (01/22/2020)  Physical Activity: Insufficiently Active (01/22/2020)  Social Connections: Moderately Isolated (01/22/2020)  Tobacco Use: Medium Risk (02/06/2023)  Health Literacy: Adequate Health Literacy (11/23/2022)     Readmission Risk Interventions    02/27/2023   11:53 AM  Readmission Risk Prevention Plan  Transportation Screening Complete  PCP or Specialist Appt within 3-5 Days Complete  HRI or Home Care Consult Complete  Social Work Consult for Recovery Care Planning/Counseling Complete  Palliative Care Screening Not Applicable  Medication  Review Oceanographer) Complete

## 2023-03-01 LAB — URINE CULTURE: Culture: >=100000 — AB

## 2023-03-06 ENCOUNTER — Inpatient Hospital Stay: Payer: Medicare PPO | Attending: Hematology and Oncology

## 2023-03-06 DIAGNOSIS — Z79899 Other long term (current) drug therapy: Secondary | ICD-10-CM | POA: Insufficient documentation

## 2023-03-06 DIAGNOSIS — R2681 Unsteadiness on feet: Secondary | ICD-10-CM | POA: Diagnosis not present

## 2023-03-06 DIAGNOSIS — C3411 Malignant neoplasm of upper lobe, right bronchus or lung: Secondary | ICD-10-CM | POA: Insufficient documentation

## 2023-03-06 DIAGNOSIS — C321 Malignant neoplasm of supraglottis: Secondary | ICD-10-CM | POA: Insufficient documentation

## 2023-03-06 DIAGNOSIS — D649 Anemia, unspecified: Secondary | ICD-10-CM | POA: Insufficient documentation

## 2023-03-06 LAB — CBC WITH DIFFERENTIAL/PLATELET
Abs Immature Granulocytes: 0.11 10*3/uL — ABNORMAL HIGH (ref 0.00–0.07)
Basophils Absolute: 0 10*3/uL (ref 0.0–0.1)
Basophils Relative: 0 %
Eosinophils Absolute: 0.1 10*3/uL (ref 0.0–0.5)
Eosinophils Relative: 1 %
HCT: 31.2 % — ABNORMAL LOW (ref 36.0–46.0)
Hemoglobin: 9.9 g/dL — ABNORMAL LOW (ref 12.0–15.0)
Immature Granulocytes: 1 %
Lymphocytes Relative: 11 %
Lymphs Abs: 1.2 10*3/uL (ref 0.7–4.0)
MCH: 34 pg (ref 26.0–34.0)
MCHC: 31.7 g/dL (ref 30.0–36.0)
MCV: 107.2 fL — ABNORMAL HIGH (ref 80.0–100.0)
Monocytes Absolute: 0.7 10*3/uL (ref 0.1–1.0)
Monocytes Relative: 6 %
Neutro Abs: 9 10*3/uL — ABNORMAL HIGH (ref 1.7–7.7)
Neutrophils Relative %: 81 %
Platelets: 212 10*3/uL (ref 150–400)
RBC: 2.91 MIL/uL — ABNORMAL LOW (ref 3.87–5.11)
RDW: 14.7 % (ref 11.5–15.5)
WBC: 11.1 10*3/uL — ABNORMAL HIGH (ref 4.0–10.5)
nRBC: 0 % (ref 0.0–0.2)

## 2023-03-06 LAB — COMPREHENSIVE METABOLIC PANEL
ALT: 11 U/L (ref 0–44)
AST: 13 U/L — ABNORMAL LOW (ref 15–41)
Albumin: 3.4 g/dL — ABNORMAL LOW (ref 3.5–5.0)
Alkaline Phosphatase: 55 U/L (ref 38–126)
Anion gap: 8 (ref 5–15)
BUN: 66 mg/dL — ABNORMAL HIGH (ref 8–23)
CO2: 21 mmol/L — ABNORMAL LOW (ref 22–32)
Calcium: 8.1 mg/dL — ABNORMAL LOW (ref 8.9–10.3)
Chloride: 108 mmol/L (ref 98–111)
Creatinine, Ser: 3.4 mg/dL — ABNORMAL HIGH (ref 0.44–1.00)
GFR, Estimated: 13 mL/min — ABNORMAL LOW (ref >60)
Glucose, Bld: 184 mg/dL — ABNORMAL HIGH (ref 70–99)
Potassium: 5 mmol/L (ref 3.5–5.1)
Sodium: 137 mmol/L (ref 135–145)
Total Bilirubin: 0.3 mg/dL (ref <1.2)
Total Protein: 6.6 g/dL (ref 6.5–8.1)

## 2023-03-06 LAB — TSH: TSH: 1.149 u[IU]/mL (ref 0.350–4.500)

## 2023-03-09 DIAGNOSIS — Z9181 History of falling: Secondary | ICD-10-CM | POA: Diagnosis not present

## 2023-03-09 DIAGNOSIS — Z6824 Body mass index (BMI) 24.0-24.9, adult: Secondary | ICD-10-CM | POA: Diagnosis not present

## 2023-03-09 DIAGNOSIS — N179 Acute kidney failure, unspecified: Secondary | ICD-10-CM | POA: Diagnosis not present

## 2023-03-09 DIAGNOSIS — K649 Unspecified hemorrhoids: Secondary | ICD-10-CM | POA: Diagnosis not present

## 2023-03-09 DIAGNOSIS — R519 Headache, unspecified: Secondary | ICD-10-CM | POA: Diagnosis not present

## 2023-03-09 DIAGNOSIS — D649 Anemia, unspecified: Secondary | ICD-10-CM | POA: Diagnosis not present

## 2023-03-09 DIAGNOSIS — M6281 Muscle weakness (generalized): Secondary | ICD-10-CM | POA: Diagnosis not present

## 2023-03-09 DIAGNOSIS — Z09 Encounter for follow-up examination after completed treatment for conditions other than malignant neoplasm: Secondary | ICD-10-CM | POA: Diagnosis not present

## 2023-03-09 DIAGNOSIS — K922 Gastrointestinal hemorrhage, unspecified: Secondary | ICD-10-CM | POA: Diagnosis not present

## 2023-03-13 ENCOUNTER — Telehealth: Payer: Self-pay | Admitting: Hematology and Oncology

## 2023-03-13 ENCOUNTER — Other Ambulatory Visit: Payer: Self-pay

## 2023-03-13 ENCOUNTER — Emergency Department (HOSPITAL_BASED_OUTPATIENT_CLINIC_OR_DEPARTMENT_OTHER): Payer: Medicare PPO

## 2023-03-13 ENCOUNTER — Emergency Department (HOSPITAL_BASED_OUTPATIENT_CLINIC_OR_DEPARTMENT_OTHER)
Admission: EM | Admit: 2023-03-13 | Discharge: 2023-03-13 | Disposition: A | Discharge status: Home / Self Care | Payer: Medicare PPO | Attending: Emergency Medicine | Admitting: Emergency Medicine

## 2023-03-13 DIAGNOSIS — I2510 Atherosclerotic heart disease of native coronary artery without angina pectoris: Secondary | ICD-10-CM | POA: Insufficient documentation

## 2023-03-13 DIAGNOSIS — E119 Type 2 diabetes mellitus without complications: Secondary | ICD-10-CM | POA: Insufficient documentation

## 2023-03-13 DIAGNOSIS — N184 Chronic kidney disease, stage 4 (severe): Secondary | ICD-10-CM | POA: Insufficient documentation

## 2023-03-13 DIAGNOSIS — I130 Hypertensive heart and chronic kidney disease with heart failure and stage 1 through stage 4 chronic kidney disease, or unspecified chronic kidney disease: Secondary | ICD-10-CM | POA: Insufficient documentation

## 2023-03-13 DIAGNOSIS — Z79899 Other long term (current) drug therapy: Secondary | ICD-10-CM | POA: Diagnosis not present

## 2023-03-13 DIAGNOSIS — Z7984 Long term (current) use of oral hypoglycemic drugs: Secondary | ICD-10-CM | POA: Diagnosis not present

## 2023-03-13 DIAGNOSIS — S0990XA Unspecified injury of head, initial encounter: Secondary | ICD-10-CM | POA: Diagnosis not present

## 2023-03-13 DIAGNOSIS — R519 Headache, unspecified: Secondary | ICD-10-CM | POA: Insufficient documentation

## 2023-03-13 DIAGNOSIS — J3489 Other specified disorders of nose and nasal sinuses: Secondary | ICD-10-CM | POA: Insufficient documentation

## 2023-03-13 DIAGNOSIS — I509 Heart failure, unspecified: Secondary | ICD-10-CM | POA: Insufficient documentation

## 2023-03-13 DIAGNOSIS — Z794 Long term (current) use of insulin: Secondary | ICD-10-CM | POA: Diagnosis not present

## 2023-03-13 LAB — CBC
HCT: 29.8 % — ABNORMAL LOW (ref 36.0–46.0)
Hemoglobin: 9.5 g/dL — ABNORMAL LOW (ref 12.0–15.0)
MCH: 33.1 pg (ref 26.0–34.0)
MCHC: 31.9 g/dL (ref 30.0–36.0)
MCV: 103.8 fL — ABNORMAL HIGH (ref 80.0–100.0)
Platelets: 245 10*3/uL (ref 150–400)
RBC: 2.87 MIL/uL — ABNORMAL LOW (ref 3.87–5.11)
RDW: 14.6 % (ref 11.5–15.5)
WBC: 8.2 10*3/uL (ref 4.0–10.5)
nRBC: 0 % (ref 0.0–0.2)

## 2023-03-13 LAB — BASIC METABOLIC PANEL
Anion gap: 10 (ref 5–15)
BUN: 64 mg/dL — ABNORMAL HIGH (ref 8–23)
CO2: 18 mmol/L — ABNORMAL LOW (ref 22–32)
Calcium: 8.4 mg/dL — ABNORMAL LOW (ref 8.9–10.3)
Chloride: 108 mmol/L (ref 98–111)
Creatinine, Ser: 2.89 mg/dL — ABNORMAL HIGH (ref 0.44–1.00)
GFR, Estimated: 16 mL/min — ABNORMAL LOW (ref >60)
Glucose, Bld: 231 mg/dL — ABNORMAL HIGH (ref 70–99)
Potassium: 5.4 mmol/L — ABNORMAL HIGH (ref 3.5–5.1)
Sodium: 136 mmol/L (ref 135–145)

## 2023-03-13 NOTE — ED Notes (Signed)
Ambulates to restroom with minimal assistance. Uses cane (baseline).

## 2023-03-13 NOTE — Discharge Instructions (Signed)
Please follow-up with your primary care provider in regards recent ER visit.  Today your imaging was reassuring and you do not have an acute infection.  Your physical exam is also reassuring as well.  You may try sinus rinses and continue the medication as prescribed to you.  If symptoms change or worsen please return to the ER.

## 2023-03-13 NOTE — Telephone Encounter (Signed)
Spoke with patient confirming upcoming appointment  

## 2023-03-13 NOTE — ED Triage Notes (Addendum)
NAD in triage. Ambulatory.  Larey Seat 11/12 struck head. Has complained of sinus pressure since. Runny nose at times, but difficult to clear. Some ear fullness. Started on flonase with no relief. Denies headache, vision changes, CP, SOB.

## 2023-03-13 NOTE — ED Notes (Signed)
Primary care called to voice concern for patient. Reports pt has fallen several times since November. Daughter reported earlier that there was blood on headrest of care (presumed to be from patient). Concerned for head trauma possible bleed. Pulled back to triage for re-eval. Imaging ordered. PERRLA 2mm. A+Ox4.

## 2023-03-13 NOTE — ED Provider Notes (Signed)
[place] EMERGENCY DEPARTMENT AT [facility] Provider Note   CSN: [id number] Arrival date & time: 03/13/23  1229     History  Chief Complaint  Patient presents with   Facial Pain   Head Injury    Anita Pollard is a 82 y.o. female history of CAD, hypertension, diabetes and CHF, CKD stage IV presented with frontal face pain after hitting her head 2 weeks ago.  Patient came to the ER and was evaluated and negative imaging at that time and states that after she left over the past few days she has had frontal sinus pain/face pain that she thinks may be a sinus infection.  Patient denies any fevers, cough, patient neck swelling.  Patient taking Tylenol to no relief.  Patient denies weakness, gait changes, neck pain, paresthesias, nausea/vomiting, seizure-like activity, chest pain or shortness of breath, abdominal pain, dysuria.  Patient is here to see if she has a sinus infection.  Home Medications Prior to Admission medications   Medication Sig Start Date End Date Taking? Authorizing Provider  carvedilol (COREG) 12.5 MG tablet Take 12.5-25 mg by mouth See admin instructions. Take 25 mg by mouth in the morning and 12.5 mg at lunchtime 05/04/22   [provider]  Cholecalciferol (VITAMIN D3) 5000 UNITS TABS Take 5,000 Units by mouth 3 (three) times daily after meals.     [provider]  Continuous Glucose Sensor (DEXCOM G7 SENSOR) MISC Inject 1 Device into the skin See admin instructions. Place 1 new sensor into the skin every 10 days    [provider]  CREON 3000-9500 units CPEP Take 2-3 capsules by mouth with breakfast, with lunch, and with evening meal. 01/07/15   [provider]  ferrous sulfate 324 MG TBEC Take 324 mg by mouth in the morning and at bedtime. Takes    [provider]  glipiZIDE (GLUCOTROL XL) 2.5 MG 24 hr tablet Take 2.5 mg by mouth See admin instructions. Take 2.5 mg by mouth before lunch daily 01/01/14   [provider]  insulin glargine, 1 Unit Dial, (TOUJEO SOLOSTAR) 300 UNIT/ML Solostar Pen Inject 5-7 Units into the skin daily. 09/02/20   [provider]  Lancets (FREESTYLE) lancets by Does not apply route.    [provider]  losartan (COZAAR) 50 MG tablet Take 50 mg by mouth in the morning.    [provider]  magnesium oxide (MAG-OX) 400 (240 Mg) MG tablet Take 400 mg by mouth 3 (three) times daily with meals.    [provider]  Melatonin 5 MG CAPS Take 5 mg by mouth See admin instructions. Take 5 mg by mouth two hours before bedtime    [provider]  Multiple Vitamin (MULTIVITAMIN) tablet Take 1 tablet by mouth daily with breakfast.    [provider]  Multiple Vitamins-Minerals (ICAPS AREDS 2 PO) Take 1 capsule by mouth in the morning and at bedtime.    [provider]  omeprazole (PRILOSEC) 20 MG capsule Take 20 mg by mouth daily before breakfast.    [provider]  Polyethyl Glycol-Propyl Glycol (SYSTANE OP) Place 1 drop into both eyes every evening.    [provider]  potassium chloride (MICRO-K) 10 MEQ CR capsule Take 10-20 mEq by mouth See admin instructions. Take 20 mEq by mouth in the morning, 10 mEq at lunchtime, and 10 mEq with the evening meal 05/24/21   [provider]  pramipexole (MIRAPEX) 0.25 MG tablet Take 0.25-0.5 mg by  mouth See admin instructions. Take 0.5 mg by mouth 2 hours prior to bedtime, then 0.25 mg at bedtime    [provider]  predniSONE (DELTASONE) 5 MG tablet Take 5 mg by mouth daily with breakfast.    [provider]  rosuvastatin (CRESTOR) 5 MG tablet Take 1 tablet (5 mg total) by mouth 2 (two) times a week. Take on Mondays and Thursdays. Patient taking differently: Take 5 mg by mouth See admin instructions. Take 5 mg by mouth at bedtime on Mondays and Thursdays only 02/10/21   [name], MD  sulfamethoxazole-trimethoprim (BACTRIM) 400-80 MG tablet  Take 1 tablet by mouth every Monday, Wednesday, and Friday. 12/25/14 04/18/23  [provider]  SYNTHROID 175 MCG tablet Take 175 mcg by mouth See admin instructions. Take 175 mcg by mouth between 2-3 AM on Mon/Tues/Wed/Thurs/Fri/Sat    [provider]  tacrolimus (PROGRAF) 1 MG capsule Take 1-2 mg by mouth See admin instructions. Take 2 mg by mouth in the morning and 1 mg in the evening    [provider]  temazepam (RESTORIL) 15 MG capsule Take 15 mg by mouth See admin instructions. Take 15 mg by mouth at bedtime and an additional 15 mg upon awakening in the middle of the night 05/20/13   [provider]  torsemide (DEMADEX) 20 MG tablet Take 3 tablets (60 mg total) by mouth daily. Patient taking differently: Take 60 mg by mouth daily with lunch. 07/02/22   Joseph Art, DO  vitamin B-12 (CYANOCOBALAMIN) 500 MCG tablet Take 500 mcg by mouth daily.    [provider]      Allergies    Penicillins, Banana, Detrol [tolterodine], Quetiapine, Risperidone and related, and Lisinopril    Review of Systems   Review of Systems  Physical Exam Updated Vital Signs BP (!) 161/66 (BP Location: Left Arm)   Pulse 66   Temp 97.8 F (36.6 C)   Resp 20   SpO2 95%  Physical Exam Vitals reviewed.  Constitutional:      General: She is not in acute distress. HENT:     Head: Normocephalic and atraumatic.     Comments: No sinus tenderness on exam No jaw claudication or temporal artery tenderness No shooting pain when palpating TMJ region    Right Ear: Tympanic membrane, ear canal and external ear normal.     Left Ear: Tympanic membrane, ear canal and external ear normal.     Mouth/Throat:     Pharynx: No posterior oropharyngeal erythema.  Eyes:     Extraocular Movements: Extraocular movements intact.     Conjunctiva/sclera: Conjunctivae normal.     Pupils: Pupils are equal, round, and reactive to light.  Cardiovascular:     Rate and Rhythm: Normal rate and  regular rhythm.     Pulses: Normal pulses.     Heart sounds: Normal heart sounds.     Comments: 2+ bilateral radial/dorsalis pedis pulses with regular rate Pulmonary:     Effort: Pulmonary effort is normal. No respiratory distress.     Breath sounds: Normal breath sounds.  Abdominal:     Palpations: Abdomen is soft.     Tenderness: There is no abdominal tenderness. There is no guarding or rebound.  Musculoskeletal:        General: Normal range of motion.     Cervical back: Normal range of motion and neck supple. No rigidity or tenderness.     Comments: 5 out of 5 bilateral grip/leg extension strength  Skin:    General: Skin is warm and dry.     Capillary Refill: Capillary refill takes less than 2 seconds.  Neurological:     General: No focal deficit present.     Mental Status: She is alert and oriented to person, place, and time.     Sensory: Sensation is intact.     Motor: Motor function is intact.     Coordination: Coordination is intact.     Gait: Gait is intact.     Comments: Sensation intact in all 4 limbs Cranial nerves III through XII intact Vision grossly intact  Psychiatric:        Mood and Affect: Mood normal.     ED Results / Procedures / Treatments   Labs (all labs ordered are listed, but only abnormal results are displayed) Labs Reviewed  CBC - Abnormal; Notable for the following components:      Result Value   RBC 2.87 (*)    Hemoglobin 9.5 (*)    HCT 29.8 (*)    MCV 103.8 (*)    All other components within normal limits  BASIC METABOLIC PANEL - Abnormal; Notable for the following components:   Potassium 5.4 (*)    CO2 18 (*)    Glucose, Bld 231 (*)    BUN 64 (*)    Creatinine, Ser 2.89 (*)    Calcium 8.4 (*)    GFR, Estimated 16 (*)    All other components within normal limits    EKG None  Radiology CT Head Wo Contrast Result Date: 03/13/2023 CLINICAL DATA:  Larey Seat and struck head on 11/12, sinus pressure since the accident, runny nose EXAM: CT  HEAD WITHOUT CONTRAST CT CERVICAL SPINE WITHOUT CONTRAST TECHNIQUE: Multidetector CT imaging of the head and cervical spine was performed following the standard protocol without intravenous contrast. Multiplanar CT image reconstructions of the cervical spine were also generated. RADIATION DOSE REDUCTION: This exam was performed according to the departmental dose-optimization program which includes automated exposure control, adjustment of the mA and/or kV according to patient size and/or use of iterative reconstruction technique. COMPARISON:  02/06/2023 CT head, 11/04/2003 CT cervical spine FINDINGS: CT HEAD FINDINGS Brain: No evidence of acute infarct, hemorrhage, mass, mass effect, or midline shift. No hydrocephalus or extra-axial fluid collection. Partial empty sella. Normal craniocervical junction. Vascular: No hyperdense vessel. Skull: Negative for fracture or focal lesion. Sinuses/Orbits: Chronic right sphenoid sinusitis. Mucosal thickening in the left sphenoid sinus. Other: The mastoid air cells are well aerated. CT CERVICAL SPINE FINDINGS Alignment: No traumatic listhesis. Trace anterolisthesis of C2 on C3 and C7 on T1 appears facet mediated. Skull base and vertebrae: No acute fracture or suspicious osseous lesion. Soft tissues and spinal canal: No prevertebral fluid or swelling. No visible canal hematoma. Disc levels: Degenerative changes in the cervical spine.No high-grade spinal canal stenosis. Upper chest: No focal pulmonary opacity or pleural effusion. Right apical calcified lesion may be the sequela of prior granulomatous disease. IMPRESSION: 1. No acute intracranial process. 2. No acute fracture or traumatic listhesis in the cervical spine. 3. Chronic right sphenoid sinusitis. Electronically Signed   By: Wiliam Ke M.D.   On: 03/13/2023 16:56   CT Cervical Spine Wo Contrast Result Date: 03/13/2023 CLINICAL DATA:  Larey Seat and struck head on 11/12, sinus pressure since the accident, runny nose EXAM:  CT HEAD WITHOUT CONTRAST CT CERVICAL SPINE WITHOUT CONTRAST TECHNIQUE: Multidetector CT imaging of the head and cervical spine was performed following the standard protocol without intravenous contrast.  Multiplanar CT image reconstructions of the cervical spine were also generated. RADIATION DOSE REDUCTION: This exam was performed according to the departmental dose-optimization program which includes automated exposure control, adjustment of the mA and/or kV according to patient size and/or use of iterative reconstruction technique. COMPARISON:  02/06/2023 CT head, 11/04/2003 CT cervical spine FINDINGS: CT HEAD FINDINGS Brain: No evidence of acute infarct, hemorrhage, mass, mass effect, or midline shift. No hydrocephalus or extra-axial fluid collection. Partial empty sella. Normal craniocervical junction. Vascular: No hyperdense vessel. Skull: Negative for fracture or focal lesion. Sinuses/Orbits: Chronic right sphenoid sinusitis. Mucosal thickening in the left sphenoid sinus. Other: The mastoid air cells are well aerated. CT CERVICAL SPINE FINDINGS Alignment: No traumatic listhesis. Trace anterolisthesis of C2 on C3 and C7 on T1 appears facet mediated. Skull base and vertebrae: No acute fracture or suspicious osseous lesion. Soft tissues and spinal canal: No prevertebral fluid or swelling. No visible canal hematoma. Disc levels: Degenerative changes in the cervical spine.No high-grade spinal canal stenosis. Upper chest: No focal pulmonary opacity or pleural effusion. Right apical calcified lesion may be the sequela of prior granulomatous disease. IMPRESSION: 1. No acute intracranial process. 2. No acute fracture or traumatic listhesis in the cervical spine. 3. Chronic right sphenoid sinusitis. Electronically Signed   By: Wiliam Ke M.D.   On: 03/13/2023 16:56    Procedures Procedures    Medications Ordered in ED Medications - No data to display  ED Course/ Medical Decision Making/ A&P                                  Medical Decision Making Amount and/or Complexity of Data Reviewed Labs: ordered. Radiology: ordered.   JORDIAN COATE 82 y.o. presented today for facial pain. Working DDx that I considered at this time includes, but not limited to, sinusitis, concussion, ICH, epidural/subdural hematoma, basilar skull fracture, trigeminal neuralgia, AOM.  R/o DDx: sinusitis, ICH, epidural/subdural hematoma, basilar skull fracture, trigeminal neuralgia, AOM: These are considered less likely due to history of present illness, physical exam, labs/imaging findings  Review of prior external notes: 02/26/23 discharge  Unique Tests and My Interpretation:  CBC: Unremarkable, similar to previous BMP: Unremarkable, similar to previous CT head without contrast: Unremarkable, no acute findings CT cervical spine without contrast: No acute findings  Social Determinants of Health: none  Discussion with Independent Historian: None  Discussion of Management of Tests: None  Risk: Low: based on diagnostic testing/clinical impression and treatment plan  Risk Stratification Score: none  Staffed with Pickering, MD  Plan: On exam patient was in no acute distress with stable vitals.  On exam patient did not have any remarkable findings.  Imaging from triage was ultimately negative for any acute findings.  Patient did have reassuring neurologic exam and with her imaging being negative suspicion of any life-threatening diagnoses.  Encourage patient continues in Tylenol or 6 hours needed patient may have a concussion from her fall and brain rest along follow-up primary care provider.  Encouraged patient to use sinus rinses if she does believe her sinuses are acting up as a spoke to her about her CT scan did not show any active sinus infection and that she does not have any tenderness on exam which patient verbalized acceptance understanding of.  Patient was given return precautions. Patient stable for  discharge at this time.  Patient verbalized understanding of plan.  This chart was dictated using voice recognition software.  Despite best efforts to proofread,  errors can occur which can change the documentation meaning.         Final Clinical Impression(s) / ED Diagnoses Final diagnoses:  Nonintractable headache, unspecified chronicity pattern, unspecified headache type    Rx / DC Orders ED Discharge Orders     None         Remi Deter 03/13/23 1837    Benjiman Core, MD 03/13/23 [phone]

## 2023-03-15 DIAGNOSIS — R519 Headache, unspecified: Secondary | ICD-10-CM | POA: Diagnosis not present

## 2023-03-16 DIAGNOSIS — D849 Immunodeficiency, unspecified: Secondary | ICD-10-CM | POA: Diagnosis not present

## 2023-03-16 DIAGNOSIS — Z940 Kidney transplant status: Secondary | ICD-10-CM | POA: Diagnosis not present

## 2023-04-04 ENCOUNTER — Telehealth: Payer: Self-pay | Admitting: *Deleted

## 2023-04-04 NOTE — Telephone Encounter (Signed)
 CALLED PATIENT TO INFORM OF CT FOR 04-11-23- ARRIVAL TIME- 4 PM @ [facility], NO RESTRICTIONS TO SCAN, PATIENT TO RECEIVE RESULTS ON 04-12-22 @ 2 PM FROM DR. SQUIRE, LVM FOR A RETURN CALL

## 2023-04-04 NOTE — Telephone Encounter (Signed)
 Returned patient's phone call, spoke with patient

## 2023-04-11 ENCOUNTER — Ambulatory Visit (HOSPITAL_COMMUNITY)
Admission: RE | Admit: 2023-04-11 | Discharge: 2023-04-11 | Disposition: A | Discharge status: Home / Self Care | Payer: Medicare Other | Source: Ambulatory Visit | Attending: Radiation Oncology | Admitting: Radiation Oncology

## 2023-04-11 DIAGNOSIS — C3411 Malignant neoplasm of upper lobe, right bronchus or lung: Secondary | ICD-10-CM | POA: Insufficient documentation

## 2023-04-13 ENCOUNTER — Encounter: Payer: Self-pay | Admitting: Hematology and Oncology

## 2023-04-13 ENCOUNTER — Other Ambulatory Visit: Payer: Self-pay

## 2023-04-13 ENCOUNTER — Telehealth: Payer: Self-pay | Admitting: Hematology and Oncology

## 2023-04-13 ENCOUNTER — Ambulatory Visit
Admission: RE | Admit: 2023-04-13 | Discharge: 2023-04-13 | Disposition: A | Discharge status: Home / Self Care | Payer: Medicare Other | Source: Ambulatory Visit | Attending: Radiation Oncology | Admitting: Radiation Oncology

## 2023-04-13 ENCOUNTER — Inpatient Hospital Stay: Payer: Medicare Other

## 2023-04-13 ENCOUNTER — Ambulatory Visit: Payer: Medicare PPO | Admitting: Hematology and Oncology

## 2023-04-13 ENCOUNTER — Inpatient Hospital Stay: Payer: Medicare Other | Attending: Hematology and Oncology | Admitting: Hematology and Oncology

## 2023-04-13 VITALS — BP 139/51 | HR 79 | Temp 97.2°F | Resp 18 | Wt 122.9 lb

## 2023-04-13 DIAGNOSIS — I2510 Atherosclerotic heart disease of native coronary artery without angina pectoris: Secondary | ICD-10-CM | POA: Insufficient documentation

## 2023-04-13 DIAGNOSIS — Z85118 Personal history of other malignant neoplasm of bronchus and lung: Secondary | ICD-10-CM | POA: Insufficient documentation

## 2023-04-13 DIAGNOSIS — Z7952 Long term (current) use of systemic steroids: Secondary | ICD-10-CM | POA: Insufficient documentation

## 2023-04-13 DIAGNOSIS — C321 Malignant neoplasm of supraglottis: Secondary | ICD-10-CM | POA: Diagnosis present

## 2023-04-13 DIAGNOSIS — R059 Cough, unspecified: Secondary | ICD-10-CM | POA: Insufficient documentation

## 2023-04-13 DIAGNOSIS — Z7984 Long term (current) use of oral hypoglycemic drugs: Secondary | ICD-10-CM | POA: Insufficient documentation

## 2023-04-13 DIAGNOSIS — Z79890 Hormone replacement therapy (postmenopausal): Secondary | ICD-10-CM | POA: Insufficient documentation

## 2023-04-13 DIAGNOSIS — R682 Dry mouth, unspecified: Secondary | ICD-10-CM | POA: Insufficient documentation

## 2023-04-13 DIAGNOSIS — C3432 Malignant neoplasm of lower lobe, left bronchus or lung: Secondary | ICD-10-CM

## 2023-04-13 DIAGNOSIS — Z880 Allergy status to penicillin: Secondary | ICD-10-CM | POA: Insufficient documentation

## 2023-04-13 DIAGNOSIS — R350 Frequency of micturition: Secondary | ICD-10-CM | POA: Diagnosis not present

## 2023-04-13 DIAGNOSIS — R2689 Other abnormalities of gait and mobility: Secondary | ICD-10-CM | POA: Diagnosis not present

## 2023-04-13 DIAGNOSIS — I700 Atherosclerosis of aorta: Secondary | ICD-10-CM | POA: Insufficient documentation

## 2023-04-13 DIAGNOSIS — N189 Chronic kidney disease, unspecified: Secondary | ICD-10-CM | POA: Diagnosis not present

## 2023-04-13 DIAGNOSIS — Z923 Personal history of irradiation: Secondary | ICD-10-CM | POA: Insufficient documentation

## 2023-04-13 DIAGNOSIS — Z85818 Personal history of malignant neoplasm of other sites of lip, oral cavity, and pharynx: Secondary | ICD-10-CM | POA: Insufficient documentation

## 2023-04-13 DIAGNOSIS — Z79899 Other long term (current) drug therapy: Secondary | ICD-10-CM | POA: Insufficient documentation

## 2023-04-13 DIAGNOSIS — Z79621 Long term (current) use of calcineurin inhibitor: Secondary | ICD-10-CM | POA: Insufficient documentation

## 2023-04-13 DIAGNOSIS — M7989 Other specified soft tissue disorders: Secondary | ICD-10-CM | POA: Insufficient documentation

## 2023-04-13 DIAGNOSIS — D631 Anemia in chronic kidney disease: Secondary | ICD-10-CM | POA: Diagnosis not present

## 2023-04-13 DIAGNOSIS — D649 Anemia, unspecified: Secondary | ICD-10-CM | POA: Diagnosis not present

## 2023-04-13 DIAGNOSIS — Z888 Allergy status to other drugs, medicaments and biological substances status: Secondary | ICD-10-CM | POA: Diagnosis not present

## 2023-04-13 DIAGNOSIS — E039 Hypothyroidism, unspecified: Secondary | ICD-10-CM | POA: Insufficient documentation

## 2023-04-13 DIAGNOSIS — R918 Other nonspecific abnormal finding of lung field: Secondary | ICD-10-CM | POA: Insufficient documentation

## 2023-04-13 DIAGNOSIS — J329 Chronic sinusitis, unspecified: Secondary | ICD-10-CM | POA: Diagnosis not present

## 2023-04-13 LAB — CBC WITH DIFFERENTIAL/PLATELET
Abs Immature Granulocytes: 0.04 10*3/uL (ref 0.00–0.07)
Basophils Absolute: 0 10*3/uL (ref 0.0–0.1)
Basophils Relative: 0 %
Eosinophils Absolute: 0.1 10*3/uL (ref 0.0–0.5)
Eosinophils Relative: 2 %
HCT: 23 % — ABNORMAL LOW (ref 36.0–46.0)
Hemoglobin: 7.6 g/dL — ABNORMAL LOW (ref 12.0–15.0)
Immature Granulocytes: 1 %
Lymphocytes Relative: 13 %
Lymphs Abs: 0.8 10*3/uL (ref 0.7–4.0)
MCH: 32.6 pg (ref 26.0–34.0)
MCHC: 33 g/dL (ref 30.0–36.0)
MCV: 98.7 fL (ref 80.0–100.0)
Monocytes Absolute: 0.6 10*3/uL (ref 0.1–1.0)
Monocytes Relative: 9 %
Neutro Abs: 5 10*3/uL (ref 1.7–7.7)
Neutrophils Relative %: 75 %
Platelets: 155 10*3/uL (ref 150–400)
RBC: 2.33 MIL/uL — ABNORMAL LOW (ref 3.87–5.11)
RDW: 16.3 % — ABNORMAL HIGH (ref 11.5–15.5)
WBC: 6.6 10*3/uL (ref 4.0–10.5)
nRBC: 0 % (ref 0.0–0.2)

## 2023-04-13 LAB — TSH: TSH: 1.816 u[IU]/mL (ref 0.350–4.500)

## 2023-04-13 LAB — COMPREHENSIVE METABOLIC PANEL
ALT: 9 U/L (ref 0–44)
AST: 13 U/L — ABNORMAL LOW (ref 15–41)
Albumin: 3.3 g/dL — ABNORMAL LOW (ref 3.5–5.0)
Alkaline Phosphatase: 63 U/L (ref 38–126)
Anion gap: 9 (ref 5–15)
BUN: 54 mg/dL — ABNORMAL HIGH (ref 8–23)
CO2: 28 mmol/L (ref 22–32)
Calcium: 7.9 mg/dL — ABNORMAL LOW (ref 8.9–10.3)
Chloride: 103 mmol/L (ref 98–111)
Creatinine, Ser: 2.63 mg/dL — ABNORMAL HIGH (ref 0.44–1.00)
GFR, Estimated: 17 mL/min — ABNORMAL LOW (ref >60)
Glucose, Bld: 92 mg/dL (ref 70–99)
Potassium: 2.9 mmol/L — ABNORMAL LOW (ref 3.5–5.1)
Sodium: 140 mmol/L (ref 135–145)
Total Bilirubin: 0.3 mg/dL (ref 0.0–1.2)
Total Protein: 6.1 g/dL — ABNORMAL LOW (ref 6.5–8.1)

## 2023-04-13 NOTE — Telephone Encounter (Signed)
Spoke with patient confirming upcoming appointment  

## 2023-04-13 NOTE — Progress Notes (Signed)
[name] presents today for follow-up after completing radiation to her supraglottis on 03/31/2020 and left lower lung on 01/14/2021 , and to review CT scan results from 04/11/2023  Pain issues, if any: Denies Using a feeding tube?: N/A Respiratory symptoms: Denies any new issues or concerns. Currently being treated for a sinus infection (reports she's about 2 weeks into her 4-week course of oral antibiotics)  Weight changes, if any:  Wt Readings from Last 3 Encounters:  04/13/23 122 lb 14.4 oz (55.7 kg)  02/26/23 120 lb (54.4 kg)  02/06/23 122 lb (55.3 kg)   Swallowing issues, if any: Denies Smoking or chewing tobacco? None Using fluoride toothpaste? Yes--sees her community dentist regularly throughout the year Last ENT visit was on: Saw Dr. Christia Reading on 12/21/2022 --History of Present Illness The patient is an 83 year old female who presents for a follow-up visit related to throat cancer. She reports an increase in coughing, particularly at night, producing thick phlegm. Additionally, she experiences dry mouth and throat, which she attributes to recent radiation treatment.  --Physical Exam Normal throat with absent tonsils. No neck mass. --Instrument: flexible fiberoptic laryngoscope  Scope location: right nare  Post-procedure details:  Patient tolerance of procedure: Tolerated well, no immediate complications Comments:  Diffuse edema and radiation changes of the epiglottis and endolarynx. No mass or ulceration. --Assessment & Plan 1. History of supraglottic cancer. 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 There is no evidence of recurrence in the pharynx or larynx. She was reassured. I recommended continued oncology follow-up. She can follow-up with me in one year.   Other notable issues, if any: Saw her medical oncologist Dr. Al Pimple today as well. Reports noticing swelling to her lower extremities about 3 days ago (states it resolves when she can get off ehr feet and  keep her legs elevated)

## 2023-04-13 NOTE — Progress Notes (Signed)
Radiation Oncology         [phone] ________________________________  Name: Anita Pollard MRN: [id number]  Date: 04/13/2023  DOB: 08/05/1940  Follow-Up Visit Note   OUTPATIENT, in person   CC: Anita Floro, MD  Anita Reading, MD  Diagnosis and Prior Radiotherapy:     No diagnosis found.    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[facility]) Staging form: Larynx - Supraglottis, AJCC 8th Edition - Clinical stage from 01/20/2020: Stage II (cT2, cN0, cM0) - Signed by Anita Peak, MD on 06/22/2020 Stage prefix: Initial diagnosis   Radiation Treatment Dates: 02/09/2020 through 03/31/2020 Site Technique Total Dose (Gy) Dose per Fx (Gy) Completed Fx Beam Energies  Larynx: HN_supragl IMRT 70/70 2 35/35 6X    Radiation Treatment Dates: 01/04/2021 through 01/14/2021 Site Technique Total Dose (Gy) Dose per Fx (Gy) Completed Fx Beam Energies  Lung, Left: Lung_Lt_lower IMRT 60/60 12 5/5 6XFFF    First Treatment Date: 2022-10-09 - Last Treatment Date: 2022-10-17 Plan Name: Lung_RUL_SBRT Site: Lung, Right Technique: SBRT/SRT-IMRT Mode: Photon Dose Per Fraction: 18 Gy Prescribed Dose (Delivered / Prescribed): 54 Gy / 54 Gy Prescribed Fxs (Delivered / Prescribed): 3 / 3    CHIEF COMPLAINT:  Here for follow-up and surveillance of lung/ throat cancer  Narrative:     [name] presents today for follow-up after completing radiation to her supraglottis on 03/31/2020 and left lower lung on 01/14/2021 , and to review CT scan results from 04/11/2023  Pain issues, if any: Denies Using a feeding tube?: N/A Respiratory symptoms: Denies any new issues or concerns. Currently being treated for a sinus infection (reports she's about 2 weeks into her 4-week course of oral antibiotics)  Weight changes, if any:  Wt Readings from Last 3 Encounters:  04/13/23 122 lb 14.4 oz (55.7 kg)  02/26/23 120 lb (54.4 kg)  02/06/23 122 lb (55.3 kg)   Swallowing issues, if any:  Denies Smoking or chewing tobacco? None Using fluoride toothpaste? Yes--sees her community dentist regularly throughout the year Last ENT visit was on: Saw Dr. Christia Pollard on 12/21/2022 --History of Present Illness The patient is an 83 year old female who presents for a follow-up visit related to throat cancer. She reports an increase in coughing, particularly at night, producing thick phlegm. Additionally, she experiences dry mouth and throat, which she attributes to recent radiation treatment.  --Physical Exam Normal throat with absent tonsils. No neck mass. --Instrument: flexible fiberoptic laryngoscope  Scope location: right nare  Post-procedure details:  Patient tolerance of procedure: Tolerated well, no immediate complications Comments:  Diffuse edema and radiation changes of the epiglottis and endolarynx. No mass or ulceration. --Assessment & Plan 1. History of supraglottic cancer. 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 There is no evidence of recurrence in the pharynx or larynx. She was reassured. I recommended continued oncology follow-up. She can follow-up with me in one year.   Other notable issues, if any: Saw her medical oncologist Dr. Al Pollard today as well. Reports noticing swelling to her lower extremities about 3 days ago (states it resolves when she can get off her feet and keep her legs elevated)     ALLERGIES:  is allergic to penicillins, banana, detrol [tolterodine], quetiapine, risperidone and related, and lisinopril.  Meds: Current Outpatient Medications  Medication Sig Dispense Refill   carvedilol (COREG) 12.5 MG tablet Take 12.5-25 mg by mouth See admin instructions. Take 25 mg by mouth in the morning and 12.5 mg at lunchtime  Cholecalciferol (VITAMIN D3) 5000 UNITS TABS Take 5,000 Units by mouth 3 (three) times daily after meals.      Continuous Glucose Sensor (DEXCOM G7 SENSOR) MISC Inject 1 Device into the skin See admin instructions. Place 1 new  sensor into the skin every 10 days     CREON 3000-9500 units CPEP Take 2-3 capsules by mouth with breakfast, with lunch, and with evening meal.     ferrous sulfate 324 MG TBEC Take 324 mg by mouth in the morning and at bedtime. Takes     glipiZIDE (GLUCOTROL XL) 2.5 MG 24 hr tablet Take 2.5 mg by mouth See admin instructions. Take 2.5 mg by mouth before lunch daily     insulin glargine, 1 Unit Dial, (TOUJEO SOLOSTAR) 300 UNIT/ML Solostar Pen Inject 5-7 Units into the skin daily.     Lancets (FREESTYLE) lancets by Does not apply route.     magnesium oxide (MAG-OX) 400 (240 Mg) MG tablet Take 400 mg by mouth 3 (three) times daily with meals.     Melatonin 5 MG CAPS Take 5 mg by mouth See admin instructions. Take 5 mg by mouth two hours before bedtime     Multiple Vitamin (MULTIVITAMIN) tablet Take 1 tablet by mouth daily with breakfast.     Multiple Vitamins-Minerals (ICAPS AREDS 2 PO) Take 1 capsule by mouth in the morning and at bedtime.     omeprazole (PRILOSEC) 20 MG capsule Take 20 mg by mouth daily before breakfast.     Polyethyl Glycol-Propyl Glycol (SYSTANE OP) Place 1 drop into both eyes every evening.     potassium chloride (MICRO-K) 10 MEQ CR capsule Take 10-20 mEq by mouth See admin instructions. Take 20 mEq by mouth in the morning, 10 mEq at lunchtime, and 10 mEq with the evening meal     pramipexole (MIRAPEX) 0.25 MG tablet Take 0.25-0.5 mg by mouth See admin instructions. Take 0.5 mg by mouth 2 hours prior to bedtime, then 0.25 mg at bedtime     predniSONE (DELTASONE) 5 MG tablet Take 5 mg by mouth daily with breakfast.     rosuvastatin (CRESTOR) 5 MG tablet Take 1 tablet (5 mg total) by mouth 2 (two) times a week. Take on Mondays and Thursdays. (Patient taking differently: Take 5 mg by mouth See admin instructions. Take 5 mg by mouth at bedtime on Mondays and Thursdays only) 30 tablet 3   sulfamethoxazole-trimethoprim (BACTRIM) 400-80 MG tablet Take 1 tablet by mouth every Monday,  Wednesday, and Friday.     SYNTHROID 175 MCG tablet Take 175 mcg by mouth See admin instructions. Take 175 mcg by mouth between 2-3 AM on Mon/Tues/Wed/Thurs/Fri/Sat     tacrolimus (PROGRAF) 1 MG capsule Take 1-2 mg by mouth See admin instructions. Take 2 mg by mouth in the morning and 1 mg in the evening     temazepam (RESTORIL) 15 MG capsule Take 15 mg by mouth See admin instructions. Take 15 mg by mouth at bedtime and an additional 15 mg upon awakening in the middle of the night     torsemide (DEMADEX) 20 MG tablet Take 3 tablets (60 mg total) by mouth daily. (Patient taking differently: Take 60 mg by mouth daily with lunch.) 90 tablet 0   vitamin B-12 (CYANOCOBALAMIN) 500 MCG tablet Take 500 mcg by mouth daily.     No current facility-administered medications for this encounter.    Physical Findings: The patient is in no acute distress. Patient is alert and oriented.  Wt  Readings from Last 3 Encounters:  04/13/23 122 lb 14.4 oz (55.7 kg)  02/26/23 120 lb (54.4 kg)  02/06/23 122 lb (55.3 kg)    vitals were not taken for this visit. .  General: Alert and oriented, in no acute distress -no respiratory distress HEENT: Head is normocephalic. Extraocular movements are intact.  Musculoskeletal: Ambulatory Neurologic: Cranial nerves II through XII are grossly intact. No obvious focalities. Speech is fluent. Coordination is intact. Psychiatric: Judgment and insight are intact. Affect is appropriate.   Lab Findings: Lab Results  Component Value Date   WBC 6.6 04/13/2023   HGB 7.6 (L) 04/13/2023   HCT 23.0 (L) 04/13/2023   MCV 98.7 04/13/2023   PLT 155 04/13/2023    Lab Results  Component Value Date   TSH 1.149 03/06/2023    Radiographic Findings: CT Chest Wo Contrast Result Date: 04/12/2023 CLINICAL DATA:  83 year old female history of non-small cell lung cancer. Followup study. EXAM: CT CHEST WITHOUT CONTRAST TECHNIQUE: Multidetector CT imaging of the chest was performed following  the standard protocol without IV contrast. RADIATION DOSE REDUCTION: This exam was performed according to the departmental dose-optimization program which includes automated exposure control, adjustment of the mA and/or kV according to patient size and/or use of iterative reconstruction technique. COMPARISON:  Chest CT 07/28/2022. FINDINGS: Cardiovascular: Heart size is normal. There is no significant pericardial fluid, thickening or pericardial calcification. There is aortic atherosclerosis, as well as atherosclerosis of the great vessels of the mediastinum and the coronary arteries, including calcified atherosclerotic plaque in the left main, left anterior descending, left circumflex and right coronary arteries. Calcifications of the aortic valve. Mediastinum/Nodes: No pathologically enlarged mediastinal or hilar lymph nodes. Please note that accurate exclusion of hilar adenopathy is limited on noncontrast CT scans. Esophagus is unremarkable in appearance. No axillary lymphadenopathy. Lungs/Pleura: Previously noted predominantly sub solid lesion in the right upper lobe has become far more solid in appearance (axial image 38 of series 5), currently measuring 2.2 x 1.4 cm with a central solid component measuring up to 1.2 cm, as well as some internal air bronchograms. These imaging findings are highly concerning for slow growing primary bronchogenic adenocarcinoma. Adjacent to this there is a satellite area of ground-glass attenuation nodularity which is new compared to the prior study (axial image 35 of series 5) measuring 1.3 x 0.6 cm, also concerning. A few other scattered tiny noncalcified pulmonary nodules measuring up to 4 mm are noted, stable compared to the prior study. Small calcified granulomas are also evident. Scattered areas of linear scarring are noted, most severe in the left lower lobe. No acute consolidative airspace disease. No pleural effusions. Chronic pleuroparenchymal thickening and  architectural distortion in the apex of the left upper lobe, similar to prior studies, likely chronic post infectious or inflammatory scarring. Upper Abdomen: Aortic atherosclerosis. Severe atrophy of the kidneys bilaterally. Musculoskeletal: There are no aggressive appearing lytic or blastic lesions noted in the visualized portions of the skeleton. IMPRESSION: 1. 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 Further evaluation with PET-CT and/or biopsy should be strongly considered in the near future to exclude underlying malignancy. 2. Aortic atherosclerosis, in addition to left main and three-vessel coronary artery disease. Please note that although the presence of coronary artery calcium documents the presence of coronary artery disease, the severity of this disease and any potential stenosis cannot  be assessed on this non-gated CT examination. Assessment for potential risk factor modification, dietary therapy or pharmacologic therapy may be warranted, if clinically indicated. 3. There are calcifications of the aortic valve. Echocardiographic correlation for evaluation of potential valvular dysfunction may be warranted if clinically indicated. 4. Severe renal atrophy bilaterally. Electronically Signed   By: Trudie Reed M.D.   On: 04/12/2023 09:00     Impression/Plan:    1) Head and Neck Cancer / lung cancer Status:   CT imaging at last visit showed likely scar tissue from therapy in the left lower lung. A slight interval increase in size was seen in the RUL lung nodule suspicious for low grade adenocarcinoma. We previously discussed her treatment options of active surveillance, biopsy, or SBRT.  The patient chose active surveillance and she was going to follow-up with me at a 69-month interval but in the interim I was  informed by medical oncology that the patient would not be a candidate for erythropoietin for her anemia if she leaves this lung nodule and treated.  Therefore, I was asked to see the patient back to give SBRT to the right upper lobe lung nodule that is suspicious for adenocarcinoma.  Patient would like to proceed with this and understands that it is not guaranteed that she will need to receive erythropoietin in the future but she would like to keep this open as an option given that she has significant kidney disease and anemia.  She also understands that it is not guaranteed that the right upper lung nodule is cancerous, but it is suspicious on imaging.  She is not interested in a biopsy and therefore we will proceed with radiation planning today and start her treatment in July.  The risks benefits and potential side effects of SBRT to the right upper lung were discussed in detail.  No guarantees were given.  Consent form was signed today.  2) Nutritional Status: Stable  Wt Readings from Last 3 Encounters:  04/13/23 122 lb 14.4 oz (55.7 kg)  02/26/23 120 lb (54.4 kg)  02/06/23 122 lb (55.3 kg)    3) Risk Factors: The patient has been educated about risk factors including alcohol and tobacco abuse; they understand that avoidance of alcohol and tobacco is important to prevent recurrences as well as other cancers   4) Swallowing: Denies any dysphagia.    5) Dental: Encouraged to continue regular followup with dentistry, and dental hygiene including fluoride rinses.   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  She will continue supplementation and labs per her PCP/endocrinologist's instructions.   She is aware of her recent abnormal TSH in the cancer center from her appointment with medical oncology.  She will discuss this with her PCP and endocrinologist.  Lab Results  Component Value Date   TSH 1.149 03/06/2023     7) Patient knows to  continue w/ ENT for laryngoscopy surveillance   8) She has questions about how to donate her body to science after she passes away, even though she realizes this is not imminent.  Nursing will look into this for her and try to find how she can fill out the necessary forms.  Ms. Westberry has a big heart and a devotion to making the world a better place.  On date of service I spent 30 minutes on this encounter.  Patient was seen face-to-face.         Anita Peak, MD

## 2023-04-13 NOTE — Progress Notes (Signed)
Patient Care Team: [name], MD as PCP - General (Family Medicine) [name], MD as PCP - Cardiology (Cardiology) [name], MD as Attending Physician (Family Medicine) Lonie Peak, MD as Consulting Physician (Radiation Oncology) Malmfelt, Lise Auer, RN as Oncology Nurse Navigator [name], MD as Consulting Physician (Cardiology)  DIAGNOSIS:  No diagnosis found.   SUMMARY OF ONCOLOGIC HISTORY: Oncology History  Malignant neoplasm of supraglottis (HCC)  01/20/2020 Cancer Staging   Staging form: Larynx - Supraglottis, AJCC 8th Edition - Clinical stage from 01/20/2020: Stage II (cT2, cN0, cM0) - Signed by Lonie Peak, MD on 06/22/2020 Stage prefix: Initial diagnosis   01/21/2020 Initial Diagnosis   Malignant neoplasm of supraglottis (HCC)     Discussed the use of AI scribe software for clinical note transcription with the patient, who gave verbal consent to proceed.  History of Present Illness    The patient, with a history of kidney disease and anemia, presents with a sinus infection. She reports facial pain, 'from one ear to the other ear,' and has been prescribed cephalexin by her primary care provider. She has been instructed to take the antibiotic for four weeks.  In addition to the sinus infection, the patient reports morning imbalance which improves throughout the day. She has noticed increased urinary frequency, waking up every two hours to urinate. She suspects this may be related to a recent meal of lobster bisque.  The patient also reports lower extremity swelling for the past three to four days. She has a follow up with Dr Basilio Cairo today, she had a CT to follow up on the RUL nodule which was most recently treated with SBRT.  Rest of the pertinent 10 point ROS reviewed and neg.  ALLERGIES:  is allergic to penicillins, banana, detrol [tolterodine], quetiapine, risperidone and related, and lisinopril.  MEDICATIONS:  Current Outpatient  Medications  Medication Sig Dispense Refill   carvedilol (COREG) 12.5 MG tablet Take 12.5-25 mg by mouth See admin instructions. Take 25 mg by mouth in the morning and 12.5 mg at lunchtime     Cholecalciferol (VITAMIN D3) 5000 UNITS TABS Take 5,000 Units by mouth 3 (three) times daily after meals.      Continuous Glucose Sensor (DEXCOM G7 SENSOR) MISC Inject 1 Device into the skin See admin instructions. Place 1 new sensor into the skin every 10 days     CREON 3000-9500 units CPEP Take 2-3 capsules by mouth with breakfast, with lunch, and with evening meal.     ferrous sulfate 324 MG TBEC Take 324 mg by mouth in the morning and at bedtime. Takes     glipiZIDE (GLUCOTROL XL) 2.5 MG 24 hr tablet Take 2.5 mg by mouth See admin instructions. Take 2.5 mg by mouth before lunch daily     insulin glargine, 1 Unit Dial, (TOUJEO SOLOSTAR) 300 UNIT/ML Solostar Pen Inject 5-7 Units into the skin daily.     Lancets (FREESTYLE) lancets by Does not apply route.     losartan (COZAAR) 50 MG tablet Take 50 mg by mouth in the morning.     magnesium oxide (MAG-OX) 400 (240 Mg) MG tablet Take 400 mg by mouth 3 (three) times daily with meals.     Melatonin 5 MG CAPS Take 5 mg by mouth See admin instructions. Take 5 mg by mouth two hours before bedtime     Multiple Vitamin (MULTIVITAMIN) tablet Take 1 tablet by mouth daily with breakfast.     Multiple Vitamins-Minerals (ICAPS AREDS  2 PO) Take 1 capsule by mouth in the morning and at bedtime.     omeprazole (PRILOSEC) 20 MG capsule Take 20 mg by mouth daily before breakfast.     Polyethyl Glycol-Propyl Glycol (SYSTANE OP) Place 1 drop into both eyes every evening.     potassium chloride (MICRO-K) 10 MEQ CR capsule Take 10-20 mEq by mouth See admin instructions. Take 20 mEq by mouth in the morning, 10 mEq at lunchtime, and 10 mEq with the evening meal     pramipexole (MIRAPEX) 0.25 MG tablet Take 0.25-0.5 mg by mouth See admin instructions. Take 0.5 mg by mouth 2 hours  prior to bedtime, then 0.25 mg at bedtime     predniSONE (DELTASONE) 5 MG tablet Take 5 mg by mouth daily with breakfast.     rosuvastatin (CRESTOR) 5 MG tablet Take 1 tablet (5 mg total) by mouth 2 (two) times a week. Take on Mondays and Thursdays. (Patient taking differently: Take 5 mg by mouth See admin instructions. Take 5 mg by mouth at bedtime on Mondays and Thursdays only) 30 tablet 3   sulfamethoxazole-trimethoprim (BACTRIM) 400-80 MG tablet Take 1 tablet by mouth every Monday, Wednesday, and Friday.     SYNTHROID 175 MCG tablet Take 175 mcg by mouth See admin instructions. Take 175 mcg by mouth between 2-3 AM on Mon/Tues/Wed/Thurs/Fri/Sat     tacrolimus (PROGRAF) 1 MG capsule Take 1-2 mg by mouth See admin instructions. Take 2 mg by mouth in the morning and 1 mg in the evening     temazepam (RESTORIL) 15 MG capsule Take 15 mg by mouth See admin instructions. Take 15 mg by mouth at bedtime and an additional 15 mg upon awakening in the middle of the night     torsemide (DEMADEX) 20 MG tablet Take 3 tablets (60 mg total) by mouth daily. (Patient taking differently: Take 60 mg by mouth daily with lunch.) 90 tablet 0   vitamin B-12 (CYANOCOBALAMIN) 500 MCG tablet Take 500 mcg by mouth daily.     No current facility-administered medications for this visit.    PHYSICAL EXAMINATION: ECOG PERFORMANCE STATUS: 1 - Symptomatic but completely ambulatory  Vitals:   04/13/23 1127  BP: (!) 139/51  Pulse: 79  Resp: 18  Temp: (!) 97.2 F (36.2 C)  SpO2: 94%   Filed Weights   04/13/23 1127  Weight: 122 lb 14.4 oz (55.7 kg)    Physical Exam    General appearance: Alert, oriented and in no acute distress Neck: No palpable cervical adenopathy Chest: Clear to auscultation bilaterally Heart: Rate and rhythm regular SKIN: 2+ BLE edema.    LABORATORY DATA:  I have reviewed the data as listed    Latest Ref Rng & Units 03/13/2023    1:40 PM 03/06/2023   11:38 AM 02/27/2023    5:41 AM  CMP   Glucose 70 - 99 mg/dL 643  329  518   BUN 8 - 23 mg/dL 64  66  49   Creatinine 0.44 - 1.00 mg/dL 8.41  6.60  6.30   Sodium 135 - 145 mmol/L 136  137  140   Potassium 3.5 - 5.1 mmol/L 5.4  5.0  4.3   Chloride 98 - 111 mmol/L 108  108  114   CO2 22 - 32 mmol/L 18  21  16    Calcium 8.9 - 10.3 mg/dL 8.4  8.1  8.5   Total Protein 6.5 - 8.1 g/dL  6.6    Total Bilirubin <1.2  mg/dL  0.3    Alkaline Phos 38 - 126 U/L  55    AST 15 - 41 U/L  13    ALT 0 - 44 U/L  11      Lab Results  Component Value Date   WBC 8.2 03/13/2023   HGB 9.5 (L) 03/13/2023   HCT 29.8 (L) 03/13/2023   MCV 103.8 (H) 03/13/2023   PLT 245 03/13/2023   NEUTROABS 9.0 (H) 03/06/2023    ASSESSMENT & PLAN:   Anemia of chronic Kidney Disease Labs from today with Hb of 7.6,  Unfortunately given her recurrent lung nodules and possibility of concomitant solid tumor we have been unable to give her erythropoietin.  No immediate indication for transfusion however we will bring her back in about couple weeks for repeat labs and transfuse her if her hemoglobin is 7 or under.   Lung Cancer Patient has a history of lung cancer and has been treated radiation several episodes, most recent one was SBRT to the right upper lobe lung nodule.  She had a CT yesterday which showed once again shows an active lung nodule in the right upper lobe and she is scheduled to discuss about possible treatment plans with Dr. Basilio Cairo this afternoon. She is asymptomatic from this  Sinusitis Currently on Cefalexin, prescribed by primary care physician, Dr. Tenny Craw. Patient reports severe facial pain and increased urinary frequency. -Advise patient to follow up with ENT specialist, Dr. Jenne Pane, for possible sinus lavage since she had no response to at least 2 weeks of antibiotics.  Balance Issues Patient reports poor balance in the morning, but improves throughout the day. Uses a walker for nighttime bathroom trips. -Continue current  management.    ------------------------------------------------------------------------------------------------------------------------------   No orders of the defined types were placed in this encounter.  The patient has a good understanding of the overall plan. she agrees with it. she will call with any problems that may develop before the next visit here.  Total time spent: 30 mins including face to face time and time spent for planning, charting and co-ordination of care   Rachel Moulds, MD 04/13/23

## 2023-04-15 ENCOUNTER — Encounter: Payer: Self-pay | Admitting: Radiation Oncology

## 2023-04-26 ENCOUNTER — Ambulatory Visit: Payer: Medicare Other | Attending: Cardiology | Admitting: Cardiology

## 2023-04-26 ENCOUNTER — Encounter: Payer: Self-pay | Admitting: Cardiology

## 2023-04-26 VITALS — BP 126/64 | HR 68 | Ht 59.0 in | Wt 125.8 lb

## 2023-04-26 DIAGNOSIS — I5032 Chronic diastolic (congestive) heart failure: Secondary | ICD-10-CM | POA: Diagnosis present

## 2023-04-26 DIAGNOSIS — I2510 Atherosclerotic heart disease of native coronary artery without angina pectoris: Secondary | ICD-10-CM

## 2023-04-26 DIAGNOSIS — N184 Chronic kidney disease, stage 4 (severe): Secondary | ICD-10-CM

## 2023-04-26 NOTE — Patient Instructions (Signed)
Medication Instructions:  The current medical regimen is effective;  continue present plan and medications.  *If you need a refill on your cardiac medications before your next appointment, please call your pharmacy*   Follow-Up: At [facility], you and your health needs are our priority.  As part of our continuing mission to provide you with exceptional heart care, we have created designated Provider Care Teams.  These Care Teams include your primary Cardiologist (physician) and Advanced Practice Providers (APPs -  Physician Assistants and Nurse Practitioners) who all work together to provide you with the care you need, when you need it.  We recommend signing up for the patient portal called "MyChart".  Sign up information is provided on this After Visit Summary.  MyChart is used to connect with patients for Virtual Visits (Telemedicine).  Patients are able to view lab/test results, encounter notes, upcoming appointments, etc.  Non-urgent messages can be sent to your provider as well.   To learn more about what you can do with MyChart, go to [email or web address].    Your next appointment:   6 month(s)  Provider:   Ronie Spies, PA-C     Then, Donato Schultz, MD will plan to see you again in 1 year(s).

## 2023-04-26 NOTE — Progress Notes (Signed)
Cardiology Office Note:  .   Date:  04/26/2023  ID:  Anita Pollard, DOB 1941/02/15, MRN [id number] PCP: Anita Floro, MD  [facility] Providers Cardiologist:  Anita Schultz, MD     History of Present Illness: .   [name] is a 83 y.o. female Discussed with the use of AI scribe  History of Present Illness   The patient is an 83 year old female with chronic diastolic heart failure and chronic kidney disease  (renal tx) who presents for follow-up.  She has chronic diastolic heart failure and was admitted for congestive heart failure in April 2024. An echocardiogram at that time showed an ejection fraction of 75% with grade one diastolic dysfunction. Her heart function has been stable since then. She is currently taking carvedilol, two tablets in the morning and one in the evening, and torsemide, 60 mg daily.  She has chronic kidney disease, currently stage four, following a double kidney transplant. She has regular follow-ups with her kidney specialists to manage her condition and ensure fluid balance is maintained without overuse of diuretics.  No new symptoms such as flutters or changes in her condition, indicating stability in her current health status.          ROS: No syncope, no bleeding  Studies Reviewed: .        Results   DIAGNOSTIC Echocardiogram: Ejection fraction 75%, grade 1 diastolic dysfunction, no aortic stenosis (06/2022) Stress echocardiogram: Normal (2015)     Creatinine 2.6, potassium 2.9, LDL 79, ALT 9, TSH 1.8 per outside labs Risk Assessment/Calculations:            Physical Exam:   VS:  BP 126/64   Pulse 68   Ht 4\' 11"  (1.499 m)   Wt 125 lb 12.8 oz (57.1 kg)   SpO2 97%   BMI 25.41 kg/m    Wt Readings from Last 3 Encounters:  04/26/23 125 lb 12.8 oz (57.1 kg)  04/13/23 122 lb 14.4 oz (55.7 kg)  02/26/23 120 lb (54.4 kg)    GEN: Well nourished, well developed in no acute distress NECK: No JVD; No carotid bruits CARDIAC:  RRR, 3/6 systolic murmur from outflow tract (no aortic stenosis), no rubs, no gallops RESPIRATORY:  Clear to auscultation without rales, wheezing or rhonchi  ABDOMEN: Soft, non-tender, non-distended EXTREMITIES:  No edema; No deformity   ASSESSMENT AND PLAN: .    Assessment and Plan    Chronic Diastolic Heart Failure Chronic diastolic heart failure with hospitalization in April 2024. Echocardiogram showed an ejection fraction of 75% with grade one diastolic dysfunction. Currently asymptomatic. Heart murmur due to hyperdynamic contraction is not concerning. Emphasized maintaining fluid balance to avoid exacerbating heart failure and protect kidney function. - Continue carvedilol 25 AM/12.5 mg p.m.  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 Under nephrologist's close monitoring to manage fluid balance and avoid exacerbating heart failure. Discussed avoiding overuse of diuretics to protect kidney function. - Continue current management and follow-up with nephrologist  Coronary Artery Disease Coronary artery disease with coronary calcifications noted on prior imaging. Normal stress echo in 2015. No current symptoms indicating progression. - Continue current management  General Health Maintenance 83 years old with multiple comorbidities. Regular follow-ups with various specialists. - Continue regular follow-ups with specialists - Maintain current medication regimen.  Signed, Anita Schultz, MD

## 2023-04-27 ENCOUNTER — Other Ambulatory Visit: Payer: Self-pay | Admitting: *Deleted

## 2023-04-27 ENCOUNTER — Inpatient Hospital Stay: Payer: Medicare Other

## 2023-04-27 DIAGNOSIS — C321 Malignant neoplasm of supraglottis: Secondary | ICD-10-CM | POA: Diagnosis not present

## 2023-04-27 DIAGNOSIS — D649 Anemia, unspecified: Secondary | ICD-10-CM

## 2023-04-27 DIAGNOSIS — E876 Hypokalemia: Secondary | ICD-10-CM

## 2023-04-27 LAB — CMP (CANCER CENTER ONLY)
ALT: 10 U/L (ref 0–44)
AST: 12 U/L — ABNORMAL LOW (ref 15–41)
Albumin: 3.2 g/dL — ABNORMAL LOW (ref 3.5–5.0)
Alkaline Phosphatase: 62 U/L (ref 38–126)
Anion gap: 8 (ref 5–15)
BUN: 47 mg/dL — ABNORMAL HIGH (ref 8–23)
CO2: 32 mmol/L (ref 22–32)
Calcium: 8.2 mg/dL — ABNORMAL LOW (ref 8.9–10.3)
Chloride: 100 mmol/L (ref 98–111)
Creatinine: 2.34 mg/dL — ABNORMAL HIGH (ref 0.44–1.00)
GFR, Estimated: 20 mL/min — ABNORMAL LOW (ref >60)
Glucose, Bld: 242 mg/dL — ABNORMAL HIGH (ref 70–99)
Potassium: 3.1 mmol/L — ABNORMAL LOW (ref 3.5–5.1)
Sodium: 140 mmol/L (ref 135–145)
Total Bilirubin: 0.5 mg/dL (ref 0.0–1.2)
Total Protein: 5.9 g/dL — ABNORMAL LOW (ref 6.5–8.1)

## 2023-04-27 LAB — CBC WITH DIFFERENTIAL (CANCER CENTER ONLY)
Abs Immature Granulocytes: 0.03 10*3/uL (ref 0.00–0.07)
Basophils Absolute: 0 10*3/uL (ref 0.0–0.1)
Basophils Relative: 0 %
Eosinophils Absolute: 0.1 10*3/uL (ref 0.0–0.5)
Eosinophils Relative: 1 %
HCT: 24.7 % — ABNORMAL LOW (ref 36.0–46.0)
Hemoglobin: 7.8 g/dL — ABNORMAL LOW (ref 12.0–15.0)
Immature Granulocytes: 0 %
Lymphocytes Relative: 11 %
Lymphs Abs: 0.8 10*3/uL (ref 0.7–4.0)
MCH: 33.1 pg (ref 26.0–34.0)
MCHC: 31.6 g/dL (ref 30.0–36.0)
MCV: 104.7 fL — ABNORMAL HIGH (ref 80.0–100.0)
Monocytes Absolute: 0.4 10*3/uL (ref 0.1–1.0)
Monocytes Relative: 5 %
Neutro Abs: 5.7 10*3/uL (ref 1.7–7.7)
Neutrophils Relative %: 83 %
Platelet Count: 150 10*3/uL (ref 150–400)
RBC: 2.36 MIL/uL — ABNORMAL LOW (ref 3.87–5.11)
RDW: 16.2 % — ABNORMAL HIGH (ref 11.5–15.5)
WBC Count: 6.9 10*3/uL (ref 4.0–10.5)
nRBC: 0 % (ref 0.0–0.2)

## 2023-04-27 LAB — SAMPLE TO BLOOD BANK

## 2023-05-11 ENCOUNTER — Inpatient Hospital Stay: Payer: Medicare Other | Attending: Hematology and Oncology

## 2023-05-11 ENCOUNTER — Other Ambulatory Visit: Payer: 59

## 2023-05-11 ENCOUNTER — Telehealth: Payer: Self-pay | Admitting: Adult Health

## 2023-05-11 ENCOUNTER — Encounter: Payer: Self-pay | Admitting: Adult Health

## 2023-05-11 ENCOUNTER — Ambulatory Visit: Payer: 59 | Admitting: Adult Health

## 2023-05-11 ENCOUNTER — Inpatient Hospital Stay: Payer: Medicare Other | Admitting: Adult Health

## 2023-05-11 ENCOUNTER — Other Ambulatory Visit: Payer: Self-pay

## 2023-05-11 VITALS — BP 135/46 | HR 79 | Temp 98.5°F | Resp 18 | Ht 59.0 in | Wt 121.8 lb

## 2023-05-11 DIAGNOSIS — Z83719 Family history of colon polyps, unspecified: Secondary | ICD-10-CM | POA: Diagnosis not present

## 2023-05-11 DIAGNOSIS — Z8249 Family history of ischemic heart disease and other diseases of the circulatory system: Secondary | ICD-10-CM | POA: Insufficient documentation

## 2023-05-11 DIAGNOSIS — Z90710 Acquired absence of both cervix and uterus: Secondary | ICD-10-CM | POA: Insufficient documentation

## 2023-05-11 DIAGNOSIS — D649 Anemia, unspecified: Secondary | ICD-10-CM | POA: Diagnosis not present

## 2023-05-11 DIAGNOSIS — I5032 Chronic diastolic (congestive) heart failure: Secondary | ICD-10-CM | POA: Insufficient documentation

## 2023-05-11 DIAGNOSIS — Z87891 Personal history of nicotine dependence: Secondary | ICD-10-CM | POA: Diagnosis not present

## 2023-05-11 DIAGNOSIS — R197 Diarrhea, unspecified: Secondary | ICD-10-CM | POA: Insufficient documentation

## 2023-05-11 DIAGNOSIS — M8580 Other specified disorders of bone density and structure, unspecified site: Secondary | ICD-10-CM | POA: Diagnosis not present

## 2023-05-11 DIAGNOSIS — E1122 Type 2 diabetes mellitus with diabetic chronic kidney disease: Secondary | ICD-10-CM | POA: Diagnosis not present

## 2023-05-11 DIAGNOSIS — Z79899 Other long term (current) drug therapy: Secondary | ICD-10-CM | POA: Diagnosis not present

## 2023-05-11 DIAGNOSIS — D631 Anemia in chronic kidney disease: Secondary | ICD-10-CM | POA: Insufficient documentation

## 2023-05-11 DIAGNOSIS — Z808 Family history of malignant neoplasm of other organs or systems: Secondary | ICD-10-CM | POA: Insufficient documentation

## 2023-05-11 DIAGNOSIS — Z833 Family history of diabetes mellitus: Secondary | ICD-10-CM | POA: Insufficient documentation

## 2023-05-11 DIAGNOSIS — N184 Chronic kidney disease, stage 4 (severe): Secondary | ICD-10-CM | POA: Insufficient documentation

## 2023-05-11 DIAGNOSIS — Z87442 Personal history of urinary calculi: Secondary | ICD-10-CM | POA: Insufficient documentation

## 2023-05-11 DIAGNOSIS — I130 Hypertensive heart and chronic kidney disease with heart failure and stage 1 through stage 4 chronic kidney disease, or unspecified chronic kidney disease: Secondary | ICD-10-CM | POA: Insufficient documentation

## 2023-05-11 DIAGNOSIS — Z9049 Acquired absence of other specified parts of digestive tract: Secondary | ICD-10-CM | POA: Diagnosis not present

## 2023-05-11 DIAGNOSIS — Z860100 Personal history of colon polyps, unspecified: Secondary | ICD-10-CM | POA: Diagnosis not present

## 2023-05-11 DIAGNOSIS — Z807 Family history of other malignant neoplasms of lymphoid, hematopoietic and related tissues: Secondary | ICD-10-CM | POA: Diagnosis not present

## 2023-05-11 DIAGNOSIS — C321 Malignant neoplasm of supraglottis: Secondary | ICD-10-CM | POA: Insufficient documentation

## 2023-05-11 DIAGNOSIS — C3432 Malignant neoplasm of lower lobe, left bronchus or lung: Secondary | ICD-10-CM | POA: Diagnosis not present

## 2023-05-11 DIAGNOSIS — Z818 Family history of other mental and behavioral disorders: Secondary | ICD-10-CM | POA: Diagnosis not present

## 2023-05-11 NOTE — Telephone Encounter (Signed)
Scheduled appointments per 2/14 los. Patient is aware of the made appointments.

## 2023-05-11 NOTE — Progress Notes (Signed)
[facility] Cancer Follow up:    Anita Floro, MD [address] 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[facility]) Staging form: Larynx - Supraglottis, AJCC 8th Edition - Clinical stage from 01/20/2020: Stage II (cT2, cN0, cM0) - Signed by Lonie Peak, MD on 06/22/2020 Stage prefix: Initial diagnosis   SUMMARY OF ONCOLOGIC HISTORY: Oncology History  Malignant neoplasm of supraglottis (HCC)  01/20/2020 Cancer Staging   Staging form: Larynx - Supraglottis, AJCC 8th Edition - Clinical stage from 01/20/2020: Stage II (cT2, cN0, cM0) - Signed by Lonie Peak, MD on 06/22/2020 Stage prefix: Initial diagnosis   01/21/2020 Initial Diagnosis   Malignant neoplasm of supraglottis (HCC)   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  Anita Pollard 83 y.o. female with a history of lung cancer, head and neck cancer, and low hemoglobin, presents for a follow-up visit. She reports feeling tired but denies any lightheadedness, nausea, or vomiting. Her hemoglobin has improved from 7.6 to 9, which she was surprised to learn as she was not aware that hemoglobin levels could increase. She denies any chest pain, abnormal heartbeats, or worsening shortness of breath. She also denies any headaches.  The patient reports bowel irregularity, with  frequent loose stools. She has been unable to identify any dietary triggers for this issue. She has been experiencing this issue for a long time, with episodes of diarrhea occurring off and on. She is currently taking Creon, prescribed by Dr. Tenny Craw, for an unspecified pancreatic issue. She has had blood in her stool in the past, but not recently.  The patient also mentions a spot on her lung that is being monitored by Dr. Basilio Cairo, with a PET scan scheduled in a few months. She has had areas in both lungs radiated in the past, and the new spot is higher in the right lung.   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  MEDICAL HISTORY: Past Medical History:  Diagnosis Date   Alcohol abuse    Anemia    Arthritis    Breast cancer (HCC) 2010   Right   Carotid artery disease (HCC)    Chronic heart failure with preserved ejection fraction (HFpEF) (HCC)    Chronic kidney disease    Colon polyp    Depression    Diabetes mellitus    GERD  (gastroesophageal reflux disease)    Heart murmur    History of kidney stones 1976   HTN (hypertension)    Hypothyroidism    Osteopenia    Pancreatitis    Ulcerative esophagitis     SURGICAL HISTORY: Past Surgical History:  Procedure Laterality Date   ABDOMINAL HYSTERECTOMY     ABDOMINOPLASTY     APPENDECTOMY     BREAST BIOPSY Right 08/03/2008   Stereo Bx, Malignant   BREAST LUMPECTOMY Right 08/26/2008   CATARACT EXTRACTION W/ INTRAOCULAR LENS  IMPLANT, BILATERAL     CO2 LASER APPLICATION N/A 01/09/2020   Procedure: CO2 LASER APPLICATION;  Surgeon: Christia Reading, MD;  Location: [facility] OR;  Service: ENT;  Laterality: N/A;   COSMETIC SURGERY     ductal carcinoma     right breast    EYE SURGERY     HIP ARTHROPLASTY Right    INSERTION OF DIALYSIS CATHETER Right 09/25/2012   Procedure: INSERTION OF DIALYSIS CATHETER;  Surgeon: Pryor Ochoa, MD;  Location: [facility] OR;  Service: Vascular;  Laterality: Right;  Righ Internal Jugular Placement   IR GASTROSTOMY TUBE MOD SED  02/10/2020   IR GASTROSTOMY TUBE REMOVAL  05/25/2020   JOINT REPLACEMENT     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   MICROLARYNGOSCOPY N/A 10/20/2019   Procedure: Suspended Microlaryngoscopy with Biopsy;  Surgeon: Christia Reading, MD;  Location: [facility] OR;  Service: ENT;  Laterality: N/A;   MICROLARYNGOSCOPY N/A 01/09/2020   Procedure: MICROLARYNGOSCOPY WITH BIOPSY;  Surgeon: Christia Reading, MD;  Location: [facility] OR;  Service: ENT;  Laterality: N/A;   RIGID ESOPHAGOSCOPY N/A 10/20/2019   Procedure: RIGID ESOPHAGOSCOPY;  Surgeon: Christia Reading, MD;  Location: [facility] OR;  Service: ENT;  Laterality: N/A;   THYROIDECTOMY     TUMMY TUCK      SOCIAL HISTORY: Social History   Socioeconomic History   Marital status: Widowed    Spouse name: Not on file   Number of children: Not on file   Years of education: Not on file   Highest education level: Not on file  Occupational History   Not on file  Tobacco Use   Smoking status: Former     Current packs/day: 0.00    Types: Cigarettes    Quit date: 09/25/1983    Years since quitting: 39.6   Smokeless tobacco: Never  Vaping Use   Vaping status: Never Used  Substance and Sexual Activity   Alcohol use: Yes    Comment: socially history of heavy drinking with intervention x 2-at least 2 occasions since april '14   Drug use: No   Sexual activity: Never  Other Topics Concern   Not on file  Social History Narrative   Not on file   Social Drivers of Health   Financial Resource Strain: Low Risk  (01/22/2020)   Overall Financial Resource Strain (CARDIA)    Difficulty of Paying Living Expenses: Not hard at all  Food Insecurity: Low Risk  (05/08/2023)   Received from [facility]   Hunger Vital Sign    Worried About  Running Out of Food in the Last Year: Never true    Ran Out of Food in the Last Year: Never true  Transportation Needs: No Transportation Needs (05/08/2023)   Received from [facility]   Transportation    In the past 12 months, has lack of reliable transportation kept you from medical appointments, meetings, work or from getting things needed for daily living? : No  Physical Activity: Insufficiently Active (01/22/2020)   Exercise Vital Sign    Days of Exercise per Week: 5 days    Minutes of Exercise per Session: 20 min  Stress: Not on file  Social Connections: Moderately Isolated (01/22/2020)   Social Connection and Isolation Panel [NHANES]    Frequency of Communication with Friends and Family: More than three times a week    Frequency of Social Gatherings with Friends and Family: Never    Attends Religious Services: Never    Database administrator or Organizations: Yes    Attends Banker Meetings: Never    Marital Status: Widowed  Intimate Partner Violence: Not At Risk (02/27/2023)   Humiliation, Afraid, Rape, and Kick questionnaire    Fear of Current or Ex-Partner: No    Emotionally Abused: No    Physically Abused: No    Sexually Abused: No     FAMILY HISTORY: Family History  Problem Relation Age of Onset   Hypertension Mother    Diabetes Father    Hypertension Father    Hypertension Sister    Dementia Sister    Multiple myeloma Sister    Thyroid cancer Daughter 43   Diabetes Brother    Colon polyps Brother    Atrial fibrillation Brother    Breast cancer Neg Hx     Review of Systems  Constitutional:  Positive for fatigue. Negative for appetite change, chills, fever and unexpected weight change.  HENT:   Negative for hearing loss, lump/mass and trouble swallowing.   Eyes:  Negative for eye problems and icterus.  Respiratory:  Negative for chest tightness, cough and shortness of breath.   Cardiovascular:  Negative for chest pain, leg swelling and palpitations.  Gastrointestinal:  Negative for abdominal distention, abdominal pain, constipation, diarrhea, nausea and vomiting.  Endocrine: Negative for hot flashes.  Genitourinary:  Negative for difficulty urinating.   Musculoskeletal:  Negative for arthralgias.  Skin:  Negative for itching and rash.  Neurological:  Negative for dizziness, extremity weakness, headaches and numbness.  Hematological:  Negative for adenopathy. Does not bruise/bleed easily.  Psychiatric/Behavioral:  Negative for depression. The patient is not nervous/anxious.       PHYSICAL EXAMINATION    Vitals:   05/11/23 1316  BP: (!) 135/46  Pulse: 79  Resp: 18  Temp: 98.5 F (36.9 C)  SpO2: 97%    Physical Exam Constitutional:      General: She is not in acute distress.    Appearance: Normal appearance. She is not toxic-appearing.  HENT:     Head: Normocephalic and atraumatic.     Mouth/Throat:     Mouth: Mucous membranes are moist.     Pharynx: Oropharynx is clear. No oropharyngeal exudate or posterior oropharyngeal erythema.  Eyes:     General: No scleral icterus. Cardiovascular:     Rate and Rhythm: Normal rate and regular rhythm.     Pulses: Normal pulses.     Heart sounds:  Normal heart sounds.  Pulmonary:     Effort: Pulmonary effort is normal.     Breath sounds: Normal breath  sounds.  Abdominal:     General: Abdomen is flat. Bowel sounds are normal. There is no distension.     Palpations: Abdomen is soft.     Tenderness: There is no abdominal tenderness.  Musculoskeletal:        General: No swelling.     Cervical back: Neck supple.  Lymphadenopathy:     Cervical: No cervical adenopathy.  Skin:    General: Skin is warm and dry.     Findings: No rash.  Neurological:     General: No focal deficit present.     Mental Status: She is alert.  Psychiatric:        Mood and Affect: Mood normal.        Behavior: Behavior normal.     LABORATORY DATA:  CBC    Component Value Date/Time   WBC 6.9 04/27/2023 1141   WBC 6.6 04/13/2023 1202   RBC 2.36 (L) 04/27/2023 1141   HGB 7.8 (L) 04/27/2023 1141   HGB 9.7 (L) 08/28/2013 0955   HCT 24.7 (L) 04/27/2023 1141   HCT 27.2 (L) 05/23/2022 1314   HCT 29.6 (L) 08/28/2013 0955   PLT 150 04/27/2023 1141   PLT 207 08/28/2013 0955   MCV 104.7 (H) 04/27/2023 1141   MCV 100.7 08/28/2013 0955   MCH 33.1 04/27/2023 1141   MCHC 31.6 04/27/2023 1141   RDW 16.2 (H) 04/27/2023 1141   RDW 12.4 08/28/2013 0955   LYMPHSABS 0.8 04/27/2023 1141   LYMPHSABS 1.1 08/28/2013 0955   MONOABS 0.4 04/27/2023 1141   MONOABS 0.6 08/28/2013 0955   EOSABS 0.1 04/27/2023 1141   EOSABS 0.5 08/28/2013 0955   BASOSABS 0.0 04/27/2023 1141   BASOSABS 0.1 08/28/2013 0955    CMP     Component Value Date/Time   NA 140 04/27/2023 1141   NA 141 07/17/2022 1524   NA 140 08/28/2013 0955   K 3.1 (L) 04/27/2023 1141   K 3.0 (LL) 08/28/2013 0955   CL 100 04/27/2023 1141   CL 111 (H) 09/03/2012 1249   CO2 32 04/27/2023 1141   CO2 16 (L) 08/28/2013 0955   GLUCOSE 242 (H) 04/27/2023 1141   GLUCOSE 110 08/28/2013 0955   GLUCOSE 214 (H) 09/03/2012 1249   BUN 47 (H) 04/27/2023 1141   BUN 57 (H) 07/17/2022 1524   BUN 75.7 (H)  08/28/2013 0955   CREATININE 2.34 (H) 04/27/2023 1141   CREATININE 5.9 (HH) 08/28/2013 0955   CALCIUM 8.2 (L) 04/27/2023 1141   CALCIUM 7.5 (L) 08/28/2013 0955   PROT 5.9 (L) 04/27/2023 1141   PROT 6.2 03/22/2020 1439   PROT 5.7 (L) 08/28/2013 0955   ALBUMIN 3.2 (L) 04/27/2023 1141   ALBUMIN 3.9 03/22/2020 1439   ALBUMIN 2.6 (L) 08/28/2013 0955   AST 12 (L) 04/27/2023 1141   AST 15 08/28/2013 0955   ALT 10 04/27/2023 1141   ALT 17 08/28/2013 0955   ALKPHOS 62 04/27/2023 1141   ALKPHOS 62 08/28/2013 0955   BILITOT 0.5 04/27/2023 1141   BILITOT 0.38 08/28/2013 0955   GFRNONAA 20 (L) 04/27/2023 1141   GFRAA 56 (L) 03/22/2020 1439      ASSESSMENT and THERAPY PLAN:   Normocytic normochromic anemia Anemia   Hemoglobin improved from 7.6 to 9.0, likely due to cessation of bleeding and body's natural response to produce more red blood cells. No current symptoms of anemia reported.   -Continue current management. Monitor for symptoms of anemia such as lightheadedness or bleeding.  Lung Lesions   Follow-up with Dr. Basilio Cairo regarding a spot on the lung. PET scan scheduled in a few months.   -Continue current management and follow-up with Dr. Basilio Cairo as planned.    Diarrhea   Chronic, intermittent diarrhea. No clear correlation with diet. Currently on Creon, prescribed by Dr. Tenny Craw.   -Recommend follow-up with Dr. Tenny Craw regarding diarrhea. Monitor for blood in stool.    Follow-up in 8 weeks with labs. If labs are done at an outside place around the time of the appointment, she can be used for the follow-up.  All questions were answered. The patient knows to call the clinic with any problems, questions or concerns. We can certainly see the patient much sooner if necessary.  Total encounter time:20 minutes*in face-to-face visit time, chart review, lab review, care coordination, order entry, and documentation of the encounter time.  Lillard Anes, NP 05/11/23 2:05 PM Medical Oncology  and Hematology [facility] [address] [place], [place] [address] Tel. [phone]    Fax. [phone]  *Total Encounter Time as defined by the Centers for Medicare and Medicaid Services includes, in addition to the face-to-face time of a patient visit (documented in the note above) non-face-to-face time: obtaining and reviewing outside history, ordering and reviewing medications, tests or procedures, care coordination (communications with other health care professionals or caregivers) and documentation in the medical record.

## 2023-05-11 NOTE — Assessment & Plan Note (Signed)
Anemia   Hemoglobin improved from 7.6 to 9.0, likely due to cessation of bleeding and body's natural response to produce more red blood cells. No current symptoms of anemia reported.   -Continue current management. Monitor for symptoms of anemia such as lightheadedness or bleeding.    Lung Lesions   Follow-up with Dr. Basilio Cairo regarding a spot on the lung. PET scan scheduled in a few months.   -Continue current management and follow-up with Dr. Basilio Cairo as planned.    Diarrhea   Chronic, intermittent diarrhea. No clear correlation with diet. Currently on Creon, prescribed by Dr. Tenny Craw.   -Recommend follow-up with Dr. Tenny Craw regarding diarrhea. Monitor for blood in stool.    Follow-up in 8 weeks with labs. If labs are done at an outside place around the time of the appointment, she can be used for the follow-up.

## 2023-06-28 ENCOUNTER — Telehealth: Payer: Self-pay

## 2023-06-28 NOTE — Telephone Encounter (Signed)
 Son, Anita Pollard called to find out what his mother's treatment plan is going forward. Attempted to call Anita Pollard and LVM for call back. It appears pt has a f/u with Dr Al Pimple 4/11 but a PET scan scheduled 4/14. This needs to be changed. Pt needs f/u one week after PET. Message sent to schedulers to contact pt for this. LVM for Anita Pollard to call back.

## 2023-06-29 ENCOUNTER — Other Ambulatory Visit: Payer: Self-pay | Admitting: *Deleted

## 2023-06-29 ENCOUNTER — Telehealth: Payer: Self-pay | Admitting: *Deleted

## 2023-06-29 ENCOUNTER — Telehealth: Payer: Self-pay | Admitting: Hematology and Oncology

## 2023-06-29 NOTE — Telephone Encounter (Signed)
 Rescheduled appointments a round a shan and the patients availability.

## 2023-06-29 NOTE — Telephone Encounter (Signed)
 This RN spoke with Jonny Ruiz and his wife per follow up on call wanting to discuss plan and new issues occurring.  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  They have the dates for the PET scan and concerned that now that her kidneys are worsening.  But are also concerned that if PET shows growth of known lung nodules would proceeding with dialysis be of benefit.  They state as well that Navie is having some mild memory loss as well.  Per end of conversation with noted goal of what is best to allow Aeron to be as strong as she can for as long as she can with good quality of life.  This note will be forwarded to MD for review and return call next week upon md return to the office.  Phone number for Jonny Ruiz given as [phone]

## 2023-07-03 ENCOUNTER — Telehealth: Payer: Self-pay | Admitting: Radiation Oncology

## 2023-07-03 ENCOUNTER — Telehealth: Payer: Self-pay | Admitting: Hematology and Oncology

## 2023-07-03 ENCOUNTER — Other Ambulatory Visit: Payer: Self-pay

## 2023-07-03 NOTE — Telephone Encounter (Signed)
 Received call from pt's son-in-law, Lyda Jester, asking to cancel all upcoming appts for pt. Lyda Jester states pt is now under hospice care and will not be needing f/u and lab appts. Lyda Jester asks that if any f/u calls are needed to please reach out to him @ [phone] and leave VM; he will return the call ASAP. I am able to cancel f/u w/ Dr. Basilio Cairo at this time only. Message sent to MedOnc team via inbasket to cx MedOnc appts.

## 2023-07-03 NOTE — Telephone Encounter (Signed)
 Canceled appointments per 4/08 "patient call" documentation send to me. The patients son informed us of the patient now being on Hospice care and requesting all future appointments being canceled.

## 2023-07-06 ENCOUNTER — Ambulatory Visit: Payer: Medicare Other | Admitting: Hematology and Oncology

## 2023-07-06 ENCOUNTER — Other Ambulatory Visit: Payer: Medicare Other

## 2023-07-09 ENCOUNTER — Other Ambulatory Visit

## 2023-07-09 ENCOUNTER — Other Ambulatory Visit (HOSPITAL_COMMUNITY)

## 2023-07-16 ENCOUNTER — Encounter (HOSPITAL_COMMUNITY)

## 2023-07-24 ENCOUNTER — Ambulatory Visit: Payer: Self-pay | Admitting: Radiation Oncology

## 2023-07-27 ENCOUNTER — Ambulatory Visit: Admitting: Hematology and Oncology
